# Patient Record
Sex: Female | Born: 1975 | Race: White | Hispanic: No | Smoking: Current every day smoker
Health system: Southern US, Community
[De-identification: ages and names within clinical notes are randomized; demographics above are authoritative.]

## PROBLEM LIST (undated history)

## (undated) DIAGNOSIS — F32A Depression, unspecified: Secondary | ICD-10-CM

## (undated) DIAGNOSIS — M792 Neuralgia and neuritis, unspecified: Secondary | ICD-10-CM

## (undated) DIAGNOSIS — I739 Peripheral vascular disease, unspecified: Secondary | ICD-10-CM

## (undated) DIAGNOSIS — M1990 Unspecified osteoarthritis, unspecified site: Secondary | ICD-10-CM

## (undated) DIAGNOSIS — K219 Gastro-esophageal reflux disease without esophagitis: Secondary | ICD-10-CM

## (undated) DIAGNOSIS — F419 Anxiety disorder, unspecified: Secondary | ICD-10-CM

## (undated) DIAGNOSIS — E7800 Pure hypercholesterolemia, unspecified: Secondary | ICD-10-CM

## (undated) DIAGNOSIS — I10 Essential (primary) hypertension: Secondary | ICD-10-CM

## (undated) DIAGNOSIS — J42 Unspecified chronic bronchitis: Secondary | ICD-10-CM

## (undated) DIAGNOSIS — F329 Major depressive disorder, single episode, unspecified: Secondary | ICD-10-CM

## (undated) DIAGNOSIS — M75100 Unspecified rotator cuff tear or rupture of unspecified shoulder, not specified as traumatic: Secondary | ICD-10-CM

## (undated) DIAGNOSIS — Z87442 Personal history of urinary calculi: Secondary | ICD-10-CM

## (undated) DIAGNOSIS — M5430 Sciatica, unspecified side: Secondary | ICD-10-CM

## (undated) DIAGNOSIS — M549 Dorsalgia, unspecified: Secondary | ICD-10-CM

## (undated) DIAGNOSIS — M722 Plantar fascial fibromatosis: Secondary | ICD-10-CM

## (undated) DIAGNOSIS — J189 Pneumonia, unspecified organism: Secondary | ICD-10-CM

## (undated) DIAGNOSIS — G8929 Other chronic pain: Secondary | ICD-10-CM

## (undated) HISTORY — DX: Peripheral vascular disease, unspecified: I73.9

## (undated) HISTORY — PX: BACK SURGERY: SHX140

## (undated) HISTORY — PX: EYE SURGERY: SHX253

## (undated) HISTORY — PX: TUBAL LIGATION: SHX77

---

## 2004-01-11 ENCOUNTER — Ambulatory Visit (HOSPITAL_COMMUNITY): Admission: RE | Admit: 2004-01-11 | Discharge: 2004-01-11 | Payer: Self-pay | Admitting: *Deleted

## 2006-06-20 ENCOUNTER — Emergency Department (HOSPITAL_COMMUNITY): Admission: EM | Admit: 2006-06-20 | Discharge: 2006-06-20 | Payer: Self-pay | Admitting: Emergency Medicine

## 2006-08-07 ENCOUNTER — Emergency Department (HOSPITAL_COMMUNITY): Admission: EM | Admit: 2006-08-07 | Discharge: 2006-08-07 | Payer: Self-pay | Admitting: Emergency Medicine

## 2006-11-22 ENCOUNTER — Emergency Department (HOSPITAL_COMMUNITY): Admission: EM | Admit: 2006-11-22 | Discharge: 2006-11-23 | Payer: Self-pay | Admitting: Emergency Medicine

## 2007-01-16 ENCOUNTER — Ambulatory Visit (HOSPITAL_COMMUNITY): Admission: RE | Admit: 2007-01-16 | Discharge: 2007-01-17 | Payer: Self-pay | Admitting: Neurosurgery

## 2007-02-23 ENCOUNTER — Emergency Department (HOSPITAL_COMMUNITY): Admission: EM | Admit: 2007-02-23 | Discharge: 2007-02-24 | Payer: Self-pay | Admitting: Emergency Medicine

## 2007-05-08 ENCOUNTER — Emergency Department (HOSPITAL_COMMUNITY): Admission: EM | Admit: 2007-05-08 | Discharge: 2007-05-08 | Payer: Self-pay | Admitting: Emergency Medicine

## 2007-05-10 ENCOUNTER — Ambulatory Visit (HOSPITAL_COMMUNITY): Admission: RE | Admit: 2007-05-10 | Discharge: 2007-05-10 | Payer: Self-pay | Admitting: Neurosurgery

## 2007-05-15 ENCOUNTER — Ambulatory Visit (HOSPITAL_COMMUNITY): Admission: RE | Admit: 2007-05-15 | Discharge: 2007-05-15 | Payer: Self-pay | Admitting: Family Medicine

## 2007-07-13 ENCOUNTER — Emergency Department (HOSPITAL_COMMUNITY): Admission: EM | Admit: 2007-07-13 | Discharge: 2007-07-13 | Payer: Self-pay | Admitting: Emergency Medicine

## 2007-08-01 ENCOUNTER — Encounter: Admission: RE | Admit: 2007-08-01 | Discharge: 2007-08-01 | Payer: Self-pay | Admitting: Family Medicine

## 2007-08-08 ENCOUNTER — Ambulatory Visit: Payer: Self-pay | Admitting: *Deleted

## 2007-08-14 ENCOUNTER — Emergency Department (HOSPITAL_COMMUNITY): Admission: EM | Admit: 2007-08-14 | Discharge: 2007-08-14 | Payer: Self-pay | Admitting: Emergency Medicine

## 2007-08-21 ENCOUNTER — Ambulatory Visit: Payer: Self-pay | Admitting: *Deleted

## 2007-08-21 ENCOUNTER — Inpatient Hospital Stay (HOSPITAL_COMMUNITY): Admission: RE | Admit: 2007-08-21 | Discharge: 2007-08-24 | Payer: Self-pay | Admitting: Neurosurgery

## 2007-09-17 ENCOUNTER — Emergency Department (HOSPITAL_COMMUNITY): Admission: EM | Admit: 2007-09-17 | Discharge: 2007-09-17 | Payer: Self-pay | Admitting: Emergency Medicine

## 2007-09-19 ENCOUNTER — Ambulatory Visit: Payer: Self-pay | Admitting: *Deleted

## 2007-09-27 ENCOUNTER — Encounter: Admission: RE | Admit: 2007-09-27 | Discharge: 2007-09-27 | Payer: Self-pay | Admitting: Neurosurgery

## 2007-09-30 ENCOUNTER — Ambulatory Visit: Payer: Self-pay | Admitting: Surgery

## 2007-10-01 ENCOUNTER — Ambulatory Visit: Payer: Self-pay | Admitting: Surgery

## 2007-10-01 ENCOUNTER — Inpatient Hospital Stay (HOSPITAL_COMMUNITY): Admission: RE | Admit: 2007-10-01 | Discharge: 2007-10-14 | Payer: Self-pay | Admitting: Surgery

## 2007-10-28 ENCOUNTER — Ambulatory Visit: Payer: Self-pay | Admitting: Surgery

## 2007-11-11 ENCOUNTER — Ambulatory Visit: Payer: Self-pay | Admitting: Surgery

## 2007-11-28 ENCOUNTER — Ambulatory Visit: Payer: Self-pay | Admitting: *Deleted

## 2007-12-04 ENCOUNTER — Emergency Department (HOSPITAL_COMMUNITY): Admission: EM | Admit: 2007-12-04 | Discharge: 2007-12-04 | Payer: Self-pay | Admitting: Emergency Medicine

## 2007-12-26 ENCOUNTER — Ambulatory Visit: Payer: Self-pay | Admitting: *Deleted

## 2008-01-06 ENCOUNTER — Emergency Department (HOSPITAL_COMMUNITY): Admission: EM | Admit: 2008-01-06 | Discharge: 2008-01-07 | Payer: Self-pay | Admitting: Emergency Medicine

## 2008-01-10 ENCOUNTER — Ambulatory Visit (HOSPITAL_COMMUNITY): Admission: RE | Admit: 2008-01-10 | Discharge: 2008-01-10 | Payer: Self-pay | Admitting: Neurosurgery

## 2008-02-04 ENCOUNTER — Emergency Department (HOSPITAL_COMMUNITY): Admission: EM | Admit: 2008-02-04 | Discharge: 2008-02-04 | Payer: Self-pay | Admitting: Emergency Medicine

## 2008-03-22 ENCOUNTER — Emergency Department (HOSPITAL_COMMUNITY): Admission: EM | Admit: 2008-03-22 | Discharge: 2008-03-22 | Payer: Self-pay | Admitting: Emergency Medicine

## 2008-05-13 ENCOUNTER — Ambulatory Visit (HOSPITAL_COMMUNITY): Admission: RE | Admit: 2008-05-13 | Discharge: 2008-05-14 | Payer: Self-pay | Admitting: Neurosurgery

## 2008-07-08 ENCOUNTER — Emergency Department (HOSPITAL_COMMUNITY): Admission: EM | Admit: 2008-07-08 | Discharge: 2008-07-08 | Payer: Self-pay | Admitting: Emergency Medicine

## 2008-07-17 ENCOUNTER — Emergency Department (HOSPITAL_COMMUNITY): Admission: EM | Admit: 2008-07-17 | Discharge: 2008-07-17 | Payer: Self-pay | Admitting: Emergency Medicine

## 2008-10-11 ENCOUNTER — Emergency Department (HOSPITAL_COMMUNITY): Admission: EM | Admit: 2008-10-11 | Discharge: 2008-10-11 | Payer: Self-pay | Admitting: Emergency Medicine

## 2008-11-15 ENCOUNTER — Emergency Department (HOSPITAL_COMMUNITY): Admission: EM | Admit: 2008-11-15 | Discharge: 2008-11-15 | Payer: Self-pay | Admitting: Emergency Medicine

## 2008-11-20 ENCOUNTER — Emergency Department (HOSPITAL_COMMUNITY): Admission: EM | Admit: 2008-11-20 | Discharge: 2008-11-20 | Payer: Self-pay | Admitting: Pediatrics

## 2009-01-26 ENCOUNTER — Emergency Department (HOSPITAL_COMMUNITY): Admission: EM | Admit: 2009-01-26 | Discharge: 2009-01-26 | Payer: Self-pay | Admitting: Emergency Medicine

## 2009-02-16 ENCOUNTER — Emergency Department (HOSPITAL_COMMUNITY): Admission: EM | Admit: 2009-02-16 | Discharge: 2009-02-16 | Payer: Self-pay | Admitting: Emergency Medicine

## 2009-02-23 ENCOUNTER — Emergency Department (HOSPITAL_COMMUNITY): Admission: EM | Admit: 2009-02-23 | Discharge: 2009-02-24 | Payer: Self-pay | Admitting: Emergency Medicine

## 2009-03-27 ENCOUNTER — Emergency Department (HOSPITAL_COMMUNITY): Admission: EM | Admit: 2009-03-27 | Discharge: 2009-03-27 | Payer: Self-pay | Admitting: Emergency Medicine

## 2009-04-21 ENCOUNTER — Emergency Department (HOSPITAL_COMMUNITY): Admission: EM | Admit: 2009-04-21 | Discharge: 2009-04-22 | Payer: Self-pay | Admitting: Emergency Medicine

## 2009-05-25 ENCOUNTER — Emergency Department (HOSPITAL_COMMUNITY): Admission: EM | Admit: 2009-05-25 | Discharge: 2009-05-25 | Payer: Self-pay | Admitting: Emergency Medicine

## 2009-06-21 ENCOUNTER — Emergency Department (HOSPITAL_COMMUNITY): Admission: EM | Admit: 2009-06-21 | Discharge: 2009-06-21 | Payer: Self-pay | Admitting: Pediatrics

## 2009-10-04 ENCOUNTER — Emergency Department (HOSPITAL_COMMUNITY): Admission: EM | Admit: 2009-10-04 | Discharge: 2009-10-05 | Payer: Self-pay | Admitting: Emergency Medicine

## 2009-10-10 ENCOUNTER — Emergency Department (HOSPITAL_COMMUNITY): Admission: EM | Admit: 2009-10-10 | Discharge: 2009-10-10 | Payer: Self-pay | Admitting: Emergency Medicine

## 2009-11-14 ENCOUNTER — Emergency Department (HOSPITAL_COMMUNITY): Admission: EM | Admit: 2009-11-14 | Discharge: 2009-11-14 | Payer: Self-pay | Admitting: Emergency Medicine

## 2009-12-15 ENCOUNTER — Emergency Department (HOSPITAL_COMMUNITY): Admission: EM | Admit: 2009-12-15 | Discharge: 2009-12-15 | Payer: Self-pay | Admitting: Emergency Medicine

## 2010-06-26 ENCOUNTER — Emergency Department (HOSPITAL_COMMUNITY)
Admission: EM | Admit: 2010-06-26 | Discharge: 2010-06-26 | Payer: Self-pay | Source: Home / Self Care | Admitting: Emergency Medicine

## 2010-08-14 ENCOUNTER — Encounter: Payer: Self-pay | Admitting: *Deleted

## 2010-10-03 LAB — POCT PREGNANCY, URINE: Preg Test, Ur: NEGATIVE

## 2010-10-10 LAB — URINALYSIS, ROUTINE W REFLEX MICROSCOPIC
Bilirubin Urine: NEGATIVE
Glucose, UA: 250 mg/dL — AB
Hgb urine dipstick: NEGATIVE
Ketones, ur: NEGATIVE mg/dL
Nitrite: NEGATIVE
Protein, ur: NEGATIVE mg/dL
Specific Gravity, Urine: 1.025 (ref 1.005–1.030)
Urobilinogen, UA: 0.2 mg/dL (ref 0.0–1.0)
pH: 5.5 (ref 5.0–8.0)

## 2010-10-10 LAB — GLUCOSE, CAPILLARY: Glucose-Capillary: 277 mg/dL — ABNORMAL HIGH (ref 70–99)

## 2010-10-16 LAB — DIFFERENTIAL
Basophils Absolute: 0 10*3/uL (ref 0.0–0.1)
Basophils Relative: 1 % (ref 0–1)
Eosinophils Absolute: 0.1 10*3/uL (ref 0.0–0.7)
Eosinophils Relative: 2 % (ref 0–5)
Lymphocytes Relative: 34 % (ref 12–46)
Lymphs Abs: 2.7 10*3/uL (ref 0.7–4.0)
Monocytes Absolute: 0.5 10*3/uL (ref 0.1–1.0)
Monocytes Relative: 6 % (ref 3–12)
Neutro Abs: 4.6 10*3/uL (ref 1.7–7.7)
Neutrophils Relative %: 58 % (ref 43–77)

## 2010-10-16 LAB — URINALYSIS, ROUTINE W REFLEX MICROSCOPIC
Bilirubin Urine: NEGATIVE
Glucose, UA: NEGATIVE mg/dL
Hgb urine dipstick: NEGATIVE
Ketones, ur: NEGATIVE mg/dL
Nitrite: NEGATIVE
Protein, ur: NEGATIVE mg/dL
Specific Gravity, Urine: 1.01 (ref 1.005–1.030)
Urobilinogen, UA: 0.2 mg/dL (ref 0.0–1.0)
pH: 6.5 (ref 5.0–8.0)

## 2010-10-16 LAB — COMPREHENSIVE METABOLIC PANEL
ALT: 11 U/L (ref 0–35)
AST: 13 U/L (ref 0–37)
Albumin: 3.5 g/dL (ref 3.5–5.2)
Alkaline Phosphatase: 34 U/L — ABNORMAL LOW (ref 39–117)
BUN: 6 mg/dL (ref 6–23)
CO2: 24 mEq/L (ref 19–32)
Calcium: 9 mg/dL (ref 8.4–10.5)
Chloride: 107 mEq/L (ref 96–112)
Creatinine, Ser: 0.5 mg/dL (ref 0.4–1.2)
GFR calc Af Amer: >60 mL/min (ref >60)
GFR calc non Af Amer: >60 mL/min (ref >60)
Glucose, Bld: 186 mg/dL — ABNORMAL HIGH (ref 70–99)
Potassium: 3.5 mEq/L (ref 3.5–5.1)
Sodium: 139 mEq/L (ref 135–145)
Total Bilirubin: 0.2 mg/dL — ABNORMAL LOW (ref 0.3–1.2)
Total Protein: 5.9 g/dL — ABNORMAL LOW (ref 6.0–8.3)

## 2010-10-16 LAB — CBC
HCT: 36.7 % (ref 36.0–46.0)
Hemoglobin: 12.6 g/dL (ref 12.0–15.0)
MCHC: 34.3 g/dL (ref 30.0–36.0)
MCV: 83.2 fL (ref 78.0–100.0)
Platelets: 278 10*3/uL (ref 150–400)
RBC: 4.41 MIL/uL (ref 3.87–5.11)
RDW: 14.1 % (ref 11.5–15.5)
WBC: 8 10*3/uL (ref 4.0–10.5)

## 2010-10-16 LAB — PREGNANCY, URINE: Preg Test, Ur: NEGATIVE

## 2010-10-16 LAB — LIPASE, BLOOD: Lipase: 17 U/L (ref 11–59)

## 2010-10-28 LAB — D-DIMER, QUANTITATIVE (NOT AT ARMC): D-Dimer, Quant: 0.24 ug/mL-FEU (ref 0.00–0.48)

## 2010-10-28 LAB — BASIC METABOLIC PANEL
BUN: 9 mg/dL (ref 6–23)
CO2: 23 mEq/L (ref 19–32)
Calcium: 9.2 mg/dL (ref 8.4–10.5)
Chloride: 105 mEq/L (ref 96–112)
Creatinine, Ser: 0.54 mg/dL (ref 0.4–1.2)
GFR calc Af Amer: >60 mL/min (ref >60)
GFR calc non Af Amer: >60 mL/min (ref >60)
Glucose, Bld: 323 mg/dL — ABNORMAL HIGH (ref 70–99)
Potassium: 3.3 mEq/L — ABNORMAL LOW (ref 3.5–5.1)
Sodium: 133 mEq/L — ABNORMAL LOW (ref 135–145)

## 2010-10-28 LAB — POCT CARDIAC MARKERS
CKMB, poc: <1 ng/mL — ABNORMAL LOW (ref 1.0–8.0)
CKMB, poc: <1 ng/mL — ABNORMAL LOW (ref 1.0–8.0)
Myoglobin, poc: 33.2 ng/mL (ref 12–200)
Myoglobin, poc: 44.2 ng/mL (ref 12–200)
Troponin i, poc: <0.05 ng/mL (ref 0.00–0.09)
Troponin i, poc: <0.05 ng/mL (ref 0.00–0.09)

## 2010-10-28 LAB — DIFFERENTIAL
Basophils Absolute: 0.1 10*3/uL (ref 0.0–0.1)
Basophils Relative: 1 % (ref 0–1)
Eosinophils Absolute: 0.2 10*3/uL (ref 0.0–0.7)
Eosinophils Relative: 2 % (ref 0–5)
Lymphocytes Relative: 34 % (ref 12–46)
Lymphs Abs: 2.5 10*3/uL (ref 0.7–4.0)
Monocytes Absolute: 0.4 10*3/uL (ref 0.1–1.0)
Monocytes Relative: 6 % (ref 3–12)
Neutro Abs: 4.2 10*3/uL (ref 1.7–7.7)
Neutrophils Relative %: 57 % (ref 43–77)

## 2010-10-28 LAB — CBC
HCT: 36.5 % (ref 36.0–46.0)
Hemoglobin: 12.8 g/dL (ref 12.0–15.0)
MCHC: 35 g/dL (ref 30.0–36.0)
MCV: 87.8 fL (ref 78.0–100.0)
Platelets: 270 10*3/uL (ref 150–400)
RBC: 4.16 MIL/uL (ref 3.87–5.11)
RDW: 12.9 % (ref 11.5–15.5)
WBC: 7.3 10*3/uL (ref 4.0–10.5)

## 2010-10-29 LAB — CBC
HCT: 36.9 % (ref 36.0–46.0)
Hemoglobin: 13.1 g/dL (ref 12.0–15.0)
MCHC: 35.4 g/dL (ref 30.0–36.0)
MCV: 88 fL (ref 78.0–100.0)
Platelets: 259 10*3/uL (ref 150–400)
RBC: 4.19 MIL/uL (ref 3.87–5.11)
RDW: 12.8 % (ref 11.5–15.5)
WBC: 8.4 10*3/uL (ref 4.0–10.5)

## 2010-10-29 LAB — DIFFERENTIAL
Basophils Absolute: 0.1 10*3/uL (ref 0.0–0.1)
Basophils Relative: 1 % (ref 0–1)
Eosinophils Absolute: 0.2 10*3/uL (ref 0.0–0.7)
Eosinophils Relative: 3 % (ref 0–5)
Lymphocytes Relative: 39 % (ref 12–46)
Lymphs Abs: 3.3 10*3/uL (ref 0.7–4.0)
Monocytes Absolute: 0.5 10*3/uL (ref 0.1–1.0)
Monocytes Relative: 6 % (ref 3–12)
Neutro Abs: 4.3 10*3/uL (ref 1.7–7.7)
Neutrophils Relative %: 51 % (ref 43–77)

## 2010-10-29 LAB — URINALYSIS, ROUTINE W REFLEX MICROSCOPIC
Bilirubin Urine: NEGATIVE
Glucose, UA: >1000 mg/dL — AB
Hgb urine dipstick: NEGATIVE
Ketones, ur: NEGATIVE mg/dL
Leukocytes, UA: NEGATIVE
Nitrite: NEGATIVE
Protein, ur: NEGATIVE mg/dL
Specific Gravity, Urine: 1.02 (ref 1.005–1.030)
Urobilinogen, UA: 0.2 mg/dL (ref 0.0–1.0)
pH: 6 (ref 5.0–8.0)

## 2010-10-29 LAB — COMPREHENSIVE METABOLIC PANEL
ALT: 17 U/L (ref 0–35)
AST: 16 U/L (ref 0–37)
Albumin: 3.5 g/dL (ref 3.5–5.2)
Alkaline Phosphatase: 62 U/L (ref 39–117)
BUN: 13 mg/dL (ref 6–23)
CO2: 25 mEq/L (ref 19–32)
Calcium: 9.4 mg/dL (ref 8.4–10.5)
Chloride: 104 mEq/L (ref 96–112)
Creatinine, Ser: 0.6 mg/dL (ref 0.4–1.2)
GFR calc Af Amer: >60 mL/min (ref >60)
GFR calc non Af Amer: >60 mL/min (ref >60)
Glucose, Bld: 258 mg/dL — ABNORMAL HIGH (ref 70–99)
Potassium: 4.2 mEq/L (ref 3.5–5.1)
Sodium: 137 mEq/L (ref 135–145)
Total Bilirubin: 0.4 mg/dL (ref 0.3–1.2)
Total Protein: 6.3 g/dL (ref 6.0–8.3)

## 2010-10-29 LAB — PREGNANCY, URINE: Preg Test, Ur: NEGATIVE

## 2010-10-29 LAB — URINE MICROSCOPIC-ADD ON

## 2010-10-30 LAB — URINALYSIS, ROUTINE W REFLEX MICROSCOPIC
Bilirubin Urine: NEGATIVE
Glucose, UA: 500 mg/dL — AB
Hgb urine dipstick: NEGATIVE
Ketones, ur: NEGATIVE mg/dL
Nitrite: NEGATIVE
Protein, ur: NEGATIVE mg/dL
Specific Gravity, Urine: 1.02 (ref 1.005–1.030)
Urobilinogen, UA: 0.2 mg/dL (ref 0.0–1.0)
pH: 5.5 (ref 5.0–8.0)

## 2010-10-30 LAB — DIFFERENTIAL
Basophils Absolute: 0.1 10*3/uL (ref 0.0–0.1)
Basophils Relative: 1 % (ref 0–1)
Eosinophils Absolute: 0.1 10*3/uL (ref 0.0–0.7)
Eosinophils Relative: 2 % (ref 0–5)
Lymphocytes Relative: 24 % (ref 12–46)
Lymphs Abs: 2 10*3/uL (ref 0.7–4.0)
Monocytes Absolute: 0.5 10*3/uL (ref 0.1–1.0)
Monocytes Relative: 7 % (ref 3–12)
Neutro Abs: 5.4 10*3/uL (ref 1.7–7.7)
Neutrophils Relative %: 67 % (ref 43–77)

## 2010-10-30 LAB — BASIC METABOLIC PANEL
BUN: 10 mg/dL (ref 6–23)
CO2: 25 mEq/L (ref 19–32)
Calcium: 9 mg/dL (ref 8.4–10.5)
Chloride: 104 mEq/L (ref 96–112)
Creatinine, Ser: 0.53 mg/dL (ref 0.4–1.2)
GFR calc Af Amer: >60 mL/min (ref >60)
GFR calc non Af Amer: >60 mL/min (ref >60)
Glucose, Bld: 285 mg/dL — ABNORMAL HIGH (ref 70–99)
Potassium: 4.3 mEq/L (ref 3.5–5.1)
Sodium: 135 mEq/L (ref 135–145)

## 2010-10-30 LAB — CBC
HCT: 39.5 % (ref 36.0–46.0)
Hemoglobin: 13.8 g/dL (ref 12.0–15.0)
MCHC: 34.9 g/dL (ref 30.0–36.0)
MCV: 88.5 fL (ref 78.0–100.0)
Platelets: 258 10*3/uL (ref 150–400)
RBC: 4.46 MIL/uL (ref 3.87–5.11)
RDW: 12.5 % (ref 11.5–15.5)
WBC: 8.1 10*3/uL (ref 4.0–10.5)

## 2010-11-02 LAB — GLUCOSE, CAPILLARY: Glucose-Capillary: 237 mg/dL — ABNORMAL HIGH (ref 70–99)

## 2010-11-20 ENCOUNTER — Emergency Department (HOSPITAL_COMMUNITY)
Admission: EM | Admit: 2010-11-20 | Discharge: 2010-11-20 | Disposition: A | Discharge status: Home / Self Care | Payer: Self-pay | Attending: Emergency Medicine | Admitting: Emergency Medicine

## 2010-11-20 DIAGNOSIS — I10 Essential (primary) hypertension: Secondary | ICD-10-CM | POA: Insufficient documentation

## 2010-11-20 DIAGNOSIS — M545 Low back pain, unspecified: Secondary | ICD-10-CM | POA: Insufficient documentation

## 2010-11-20 DIAGNOSIS — E119 Type 2 diabetes mellitus without complications: Secondary | ICD-10-CM | POA: Insufficient documentation

## 2010-11-20 DIAGNOSIS — G8929 Other chronic pain: Secondary | ICD-10-CM | POA: Insufficient documentation

## 2010-12-06 NOTE — Op Note (Signed)
NAME:  [name], [name] NO.:  [id number]   MEDICAL RECORD NO.:  [id number]          PATIENT TYPE:  INP   LOCATION:  3002                         FACILITY:  [facility]   PHYSICIAN:  Balinda Quails, M.D.    DATE OF BIRTH:  June 08, 1976   DATE OF PROCEDURE:  08/21/2007  DATE OF DISCHARGE:                               OPERATIVE REPORT   SURGEON:  Balinda Quails, M.D.   CO-SURGEONColetta Memos, M.D.   ANESTHESIA:  General endotracheal.   ANESTHESIOLOGIST:  Germaine Pomfret, M.D.   PREOPERATIVE DIAGNOSIS:  L5-S1 degenerative disc disease.   POSTOPERATIVE DIAGNOSIS:  L5-S1 degenerative disc disease.   PROCEDURE:  L5-S1 anterior lumbar interbody fusion (ALIF).   CLINICAL NOTE:  Tina Pham is a 35 year old female with chronic  back pain and history of previous posterior back surgery.  She was  referred to the office. She was seen preoperatively and assessed for L5-  S1 ALIF.  Although she does have morbid obesity, she was felt to be an  acceptable candidate.  The potential risks of the operative procedure  were reviewed in detail with the patient and dictated in her  consultation note.  Note was made of her obesity and slightly increased  risk associated with this.   OPERATIVE PROCEDURE:  The patient was brought to the operating room in  stable hemodynamic condition.  She was placed under general endotracheal  anesthesia.  Arterial line and venous catheter were in place.  In the  supine position, the abdomen was prepped and draped in a sterile  fashion.   A transverse skin incision was made at the [facility] site in the left  lower quadrant from the midline to lateral margin of the rectus.  Dissection was carried down through subcutaneous tissues with  electrocautery.  Anterior rectus sheath was cleared from midline to  lateral margin.  The left anterior rectus sheath was incised  transversely.  Inferior and superior flaps of anterior rectus sheath  were  created.  The rectus muscle mobilized medially.  The patient did  have a previous Pfannenstiel incision and the medial portion of the  rectus muscle was quite fixed.   The retroperitoneal space was then entered in the left lower quadrant.  The inferior epigastric vessels were taken with the peritoneal contents  and rotated anteriorly.  The left common iliac artery and external iliac  artery were identified.  The L5-S1 disc space was palpated.  The ureter  mobilized and retracted medially with the abdominal contents.  Blunt  dissection was carried directly down onto the L5-S1 disc.  The presacral  fascia was then incised and the soft tissues bluntly pushed off of the  L5-S1 disc.  The left common iliac vein was freed off the left side of  the L5-S1 bodies.  The L5-S1 disc was then cleared from left-to-right  over to the lateral margin on the right of the L5-S1 bodies.  With the  disc fully exposed, a Thompson Brau retractor was assembled.  This was  assembled and using reverse lip Brau blades, the disc was exposed with  the blades hooked  onto the lateral margins of L5-S1 bilaterally.  Malleable retractors were used to retract superiorly and inferiorly.   Dr. Franky Macho then completed the L5-S1 ALIF.  Once the instrumentation was  completed, retractors were removed.  There was no significant bleeding.  The ureter was placed back in its anatomical position.  No apparent  complications.  Pulse oximetry remained 100% in the left foot.  The  anterior rectus sheath was then closed running 0 Vicryl suture.  The  deep subcutaneous layer was closed with a running 2-0 Vicryl suture.  The superficial subcutaneous layer was closed with a second layer of  running 2-0 Vicryl suture.  The most superficial layer was closed with a  running 3-0 Vicryl suture.  The skin was closed with 4-0 Monocryl.  Dermabond was applied.  Sponge and instrument counts were correct.  The  patient tolerated the procedure well.   There were no apparent  complications.  She was transferred to the recovery room in stable  condition.      Balinda Quails, M.D.  Electronically Signed     PGH/MEDQ  D:  08/21/2007  T:  08/21/2007  Job:  [id number]

## 2010-12-06 NOTE — Op Note (Signed)
NAME:  AYN, DOMANGUE NO.:  [id number]   MEDICAL RECORD NO.:  [id number]          PATIENT TYPE:  OIB   LOCATION:  3533                         FACILITY:  [facility]   PHYSICIAN:  Coletta Memos, M.D.     DATE OF BIRTH:  July 13, 1976   DATE OF PROCEDURE:  05/13/2008  DATE OF DISCHARGE:                               OPERATIVE REPORT   PREOPERATIVE DIAGNOSIS:  Left L5-S1 foraminal narrowing causing left L5  radiculopathy.   POSTOPERATIVE DIAGNOSIS:  Left L5-S1 foraminal narrowing causing left L5  radiculopathy.   PROCEDURE:  Left L5-S1 facetectomy with microdissection.   SURGEON:  Coletta Memos, MD   ASSISTANT:  None.   COMPLICATIONS:  None.   ANESTHESIA:  General endotracheal.   INDICATIONS:  Tina Pham is a 35 year old who has had a  diskectomy done on the right side and then subsequently had an anterior  fusion from which she is now fused, but she persisted with pain in the  left lower extremity.  Post-myelogram CT showed what I thought to be  recurrent disk, but was actually an osteophyte which may have caused  some compression of the left L5 root.  It was certainly not clear that  was the case on a myelogram, but it was certainly a reasonable  assumption.  Given that and given the fact that she was already fused, I  proposed L5-S1 facetectomy to thoroughly decompress the L5 nerve root.  She agreed.   OPERATIVE NOTE:  Tina Pham was brought to the operating room,  intubated, and placed under general anesthetic.  She was rolled prone  onto a [employer] table.  All pressure points were properly padded.  Her back was prepped.  She was draped in a sterile fashion.  I opened  the old incision and took this down to the thoracolumbar fascia.  I then  exposed lamina of L5 and of S1.  I then performed a facetectomy using a  high-speed drill and a Kerrison punch.  This was done with  microdissection.  With Dr. Fredrich Birks assistance, we were able to  clearly  identify the left L5 root.  The osteophyte was seen.  It was not  compressing the root now that the facet had been removed on the left  side.  There was no bony compression that I could appreciate at any  point from the lateral aspect of the spinal canal through the foramen  and pass the foramen.  I therefore irrigated.  I therefore closed the  wound in layered fashion using Vicryl sutures.  Dermabond was used for  sterile dressing.           ______________________________  Coletta Memos, M.D.     KC/MEDQ  D:  05/13/2008  T:  05/14/2008  Job:  [id number]

## 2010-12-06 NOTE — Discharge Summary (Signed)
NAME:  Tina Pham, Tina Pham NO.:  [id number]   MEDICAL RECORD NO.:  [id number]          PATIENT TYPE:  INP   LOCATION:  2002                         FACILITY:  [facility]   PHYSICIAN:  Juleen China IV, MDDATE OF BIRTH:  [birth year]   DATE OF ADMISSION:  10/01/2007  DATE OF DISCHARGE:  10/14/2007                               DISCHARGE SUMMARY   ADMISSION DIAGNOSIS:  Left flank abscess.   DISCHARGE DIAGNOSIS:  Left flank abscess.   SECONDARY DISCHARGE DIAGNOSES:  1. Type 1 diabetes.  2. Hypertension.  3. Tobacco abuse.  4. Left eye surgery.  5. Tubal ligation in 2000.  6. L5-S1 discectomy.   PROCEDURES:  Incision and drainage of the left flank abscess including  debridement of the skin and soft tissue done by Dr. Myra Gianotti on October 01, 2007.   CONSULTS:  1. Wound care on October 02, 2007.  2. Case management on October 04, 2007.  3. Pharmacy on October 08, 2007.   BRIEF HISTORY AND PHYSICAL:  This is a 35 year old female who has  undergone previous spine surgery.  She had a retroperitoneal approach to  her anterior spine via left flank incision.  She had developed  superficial fat necrosis for which she was doing dressing changes.  However, she came to [facility] on September 30, 2007, and had purulent drainage  from her wound.  She would not tolerate dressing changes.  The patient  comes to the operating room on October 01, 2007, to have wound debrided by  Dr. Myra Gianotti.  Risks and benefits were discussed and informed consent was  signed.   HOSPITAL COURSE:  The patient was admitted to [facility] on  October 01, 2007, to have wound debrided by Dr. Myra Gianotti in the OR.  The  patient signed consent and tolerated procedure without any  complications.  The patient was transferred to the PACU area, where she  continued to remain stable and then later floor 2000, where she remained  stable throughout the night.  The patient's vitals were stable and  afebrile.  The patient's white  blood count was 7.6.  The patient was  complaining of pain, but stated that it was slightly improving.  The  plan was for wound VAC to be placed on that Friday.  Also, dressing  changes b.i.d.  On postop day #2, the patient stated there was still  some burning and pain at wound site.  White blood count has not changed.  Vital signs were stable and the patient was afebrile.  Plan was for  wound care to get involved and possibly start a wound VAC on that  Friday.  Also, hydrotherapy had began and dressing changes b.i.d.  Wound  care on October 04, 2007, stated that there was some slough in the wound  and had increased since initial assessment.  Also, that there was  moderate drainage with a strong odor.  At this time, they did not  recommend wound VAC and to continue the pulse lavage to remove nonviable  tissue.  Dr. Myra Gianotti agreed with this plan.  On October 05, 2007, the  patient  was still complaining of some pain, still requiring pulse lavage  at bedside, debridement.  I felt that it was appropriate to continue  this throughout the weekend, then would revaluate for wound VAC on  Monday.  The patient was taking Avelox at this time, IV, this was day 8  on October 08, 2007.  Throughout the course, the patient still stated that  she is having mild pain.  The patient was on PCA pump for pain and that  was discontinued on October 11, 2007, and the patient went to oral pain  meds which she tolerated.  Also, wound VAC was placed on the patient.  The patient's wound was improving.  Also, was arranged to have wound VAC  and a home health nurse for wound care at home.  The patient continued  to remain afebrile and vital signs were stable.  I did not feel like  there was any change significantly over the weekend and the wound VAC  was in place.  On October 14, 2007, the wound VAC and dressings were  changed.  The patient was still experiencing some pain, and there was a  strong odor from the drainage.  The  patient did tolerate, and we will  discharge to home today with wound VAC in place and home health  arrangements.  The patient states that she agrees to this plan. The  patient also will continue Avelox p.o. until she follows up with Dr.  Myra Gianotti.  Currently, the patient is stable, and we will discharge to  home on October 14, 2007.   DISCHARGE MEDICATIONS:  1. Actos 15 mg daily.  2. Megestrol 40 mg daily.  3. Amaryl 8 mg daily.  4. Celexa 40 mg daily.  5. Propranolol LA 160 mg daily.  6. Byetta 10 mcg x2 daily.  7. Percocet as needed every 6 hours for pain.  8. insulin  50 units subcu daily with meals.  9. Amitriptyline 100 mg daily with meals.  10.Aspirin 81 mg daily.  11.Valium 10 mg 2 times a day.   DISCHARGE INSTRUCTIONS:  The patient is indicated to discharge on October 15, 2007.  Discharge instructions were discussed with the patient and  the patient agrees and states she understands these instructions.  The  patient is to follow a diabetic diet.  The patient is to have a home  health nurse care for wound and VAC changes.  The patient is to increase  activity slowly and walk with assistance.  The patient is to call office  if she has any questions or notices any significant change.  The patient  will follow up with Dr. Myra Gianotti in 1 week to see as needed.  Office will  call the contact.  The patient is to continue home medications per  reconciliation sheet.  The patient is also given a prescription for  Avelox 400 mg 1 tablet daily until followup with Dr. Myra Gianotti.   She is discharged to home on October 14, 2007.  The patient is currently  stable.      Cyndy Freeze, PA      V. Charlena Cross, MD  Electronically Signed    ALW/MEDQ  D:  10/14/2007  T:  10/15/2007  Job:  [id number]

## 2010-12-06 NOTE — Assessment & Plan Note (Signed)
OFFICE VISIT   Tina Pham, Tina Pham  DOB:  08/23/1975                                       11/11/2007  QMVHQ#:[id number]   REASON FOR VISIT:  Followup.   HISTORY:  This is a 35 year old female who is status post anterior  exposure for L5-S1 disease.  This is complicated by left lower quadrant  wound infection which ultimately required operative debridement.  She  has been using a wound vac over her wound and comes back in today for  further followup.   EXAM:  Blood pressure 139/87, pulse 82, respirations 18.  The wound is  healing well.  However, there is some induration, as well as purulent  drainage from the lateral aspect of her wound.  This area was opened and  pus was evacuated.   Status post left lower quadrant infection,   PLAN:  I think the patient needs to go back to wet-to-dry dressing  changes twice a day, given that there is a small area that needs to be  packed as it was inadequately drained with the wound vac.  She will  follow with [place] in 2 weeks.  Again, she will change her dressings to  twice a day wet-to-dry.   Jorge Ny, MD  Electronically Signed   VWB/MEDQ  D:  11/11/2007  T:  11/12/2007  Job:  [id number]

## 2010-12-06 NOTE — Discharge Summary (Signed)
NAME:  Tina Pham, HATA NO.:  [id number]   MEDICAL RECORD NO.:  [id number]          PATIENT TYPE:  INP   LOCATION:  3002                         FACILITY:  [facility]   PHYSICIAN:  Hilda Lias, M.D.   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   FINAL DIAGNOSES:  1. 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  2. Obesity.   CLINICAL HISTORY:  The patient was admitted to the hospital by Dr.  Franky Macho because of recurrent back pain with radiation to her leg.  Surgery was advised to anterior approach.   LABORATORY:  Normal.   COURSE IN THE HOSPITAL:  Patient was taken to surgery by Dr. Franky Macho  with the help of Dr. Madilyn Fireman.  Anterior interbody fusion at L5-S1 was  done.  Today she is feeling much better.  She is ambulating.  She had  minimal discomfort.  She is ready to go home.   CONDITION ON DISCHARGE:  Improving.   MEDICATIONS:  Percocet for pain.   DIET:  She was advised about losing weight.   ACTIVITY:  Not to drive for at least a week.   FOLLOW UP:  She will be calling to see Dr. Franky Macho in the next 2-3 weeks  or before as needed.           ______________________________  Hilda Lias, M.D.     EB/MEDQ  D:  08/24/2007  T:  08/24/2007  Job:  [id number]

## 2010-12-06 NOTE — Op Note (Signed)
NAME:  Tina Pham, Tina Pham NO.:  [id number]   MEDICAL RECORD NO.:  [id number]          PATIENT TYPE:  OIB   LOCATION:  3038                         FACILITY:  [facility]   PHYSICIAN:  Coletta Memos, M.D.     DATE OF BIRTH:  1976/03/01   DATE OF PROCEDURE:  01/16/2007  DATE OF DISCHARGE:  01/17/2007                               OPERATIVE REPORT   PREOPERATIVE DIAGNOSES:  1. Displaced disk L5-S1 on the left side.  2. Left S1 radiculopathy.   POSTOPERATIVE DIAGNOSES:  1. Displaced disk L5-S1 on the left side.  2. Left S1 radiculopathy.   PROCEDURE:  Left L5 and S1 semihemilaminectomy and diskectomy with  microdissection.   COMPLICATIONS:  None.   SURGEON:  Coletta Memos, M.D.   ASSISTANT:  Hilda Lias, M.D.   INDICATIONS:  Tina Pham is a 35 year old with significant pain  in the back and left lower extremity secondary to a very large disk  herniation at L5-S1 on the left side.  Secondary to weakness in the  gastrocnemius on the left side, I have recommended operative  decompression.  She agrees and is admitted today for procedure.   OPERATIVE NOTE:  Tina Pham was brought to the operating room  intubated and placed under a general anesthetic without difficulty.  Back was prepped and she was draped in a sterile fashion.  Using  preoperative localizing I infiltrated her lumbar region with 0.5%  lidocaine with 1:200,000 strength epinephrine.  I opened the skin with a  #10 blade.  I took this down to the thoracolumbar fascia sharply.  I  then exposed the lamina of what I believed to be L5 and S1.  I exposed  the lamina and using x-ray I was able confirm our location in the L5-S1  interlaminar space.  I then performed semihemilaminectomy of L5 using a  high-speed drill and Kerrison punches.  I then removed the ligamentum  flavum to expose the thecal sac.  I then brought the microscope into the  operative field to aid in dissection.  With microdissection  and with Dr.  Cassandria Santee assistance, we removed what was a very large disk herniation at  L5-S1.  I again inspected the S1 nerve roots to make sure that there  were no fragments left.  I did not appreciate any and felt that both the  S1-L5 roots were very well decompressed.  I then irrigated the wound.  I  then closed the wound in layered fashion with Vicryl sutures with Dr.  Cassandria Santee assistance.  Dermabond was used for sterile dressing.           ______________________________  Coletta Memos, M.D.    KC/MEDQ  D:  02/04/2007  T:  02/05/2007  Job:  [id number]

## 2010-12-06 NOTE — Op Note (Signed)
NAME:  Tina Pham, STANARD NO.:  [id number]   MEDICAL RECORD NO.:  [id number]          PATIENT TYPE:  INP   LOCATION:  2002                         FACILITY:  [facility]   PHYSICIAN:  Juleen China IV, MDDATE OF BIRTH:  02/21/1976   DATE OF PROCEDURE:  10/01/2007  DATE OF DISCHARGE:                               OPERATIVE REPORT   PREOPERATIVE DIAGNOSIS:  Left flank abscess.   POSTOPERATIVE DIAGNOSIS:  Left flank abscess.   PROCEDURE PERFORMED:  Incision and drainage of the left flank abscess  including debridement of skin and soft tissue.   ANESTHESIA:  General.   SURGEON:  1. Charlena Cross, MD.   ASSISTANT:  Jerold Coombe, P.A.   FINDINGS:  Fascia was not involved.  This was a superficial process.   DRESSING:  Kerlix.   COMPLICATIONS:  None.   INDICATION:  This is a 35 year old female who has undergone previous  spine surgery.  She had a retroperitoneal approach to her anterior spine  via left flank incision.  She had developed superficial fat necrosis for  which she was doing dressing changes.  However, she came to the office  yesterday, had purulent drainage from her wound.  She would not tolerate  dressing changes.  She comes to the operating room for debridement.  Risks and benefits were discussed and informed consent was signed.   PROCEDURE:  The patient was identified in the holding area and taken to  room 8.  She was placed supine on the table.  General endotracheal  anesthesia was administered.  The patient's abdomen was prepped and  draped in standard sterile fashion.  Time-out was called.  Culture swabs  were first placed within the wound and sent for routine cultures.  At  this point in time, systemic antibiotics were given.  The wound was then  easily opened up.  There was a superficial epidermolysis and fat  necrosis.  This tracked down too, but did not involve the abdominal wall  fascia.  This was strictly a superficial process.  All  of the necrotic  tissue was debrided sharply with a  combination of curved Mayo scissors and a #10 blade.  Once that was  satisfied, the only healthy tissue remained.  The wound was copiously  irrigated.  Damp Kerlix was used for dressing.  Montgomery straps were  also placed with 4 x 4s and ABDs.  The patient was successfully awakened  from anesthesia.  There were no complications.           ______________________________  V. Charlena Cross, MD  Electronically Signed     VWB/MEDQ  D:  10/01/2007  T:  10/02/2007  Job:  [id number]

## 2010-12-06 NOTE — Assessment & Plan Note (Signed)
OFFICE VISIT   Tina Pham, Tina Pham  DOB:  06/21/1976                                       09/30/2007  ZOXWR#:[id number]   HISTORY:  This is a 35 year old female who is status post anterior  exposure for L5-S1 degenerative disk disease.  On 08/21/07, she  underwent repair.  She was recently seen in the office and found to have  wound infection.  This has been managed with dressing changes.  I was  called by home health today for worsening drainage and foul smell from  the wound.  She has not been having any fevers or chills.  She comes in  today for further evaluation.   PAST MEDICAL HISTORY:  1. Type 1 diabetes since age 7.  2. Hypertension.  3. Tobacco abuse.   PAST SURGICAL HISTORY:  1. Left eye surgery in 1986 and 1988.  2. Tubal ligation in 2000.  3. L5-S1 diskectomy in June, 2008.  4. ALIF in August 31, 2007.   FAMILY HISTORY:  Mom died of CHF at age 82.  Father died at age 48 of an  MI.   SOCIAL HISTORY:  She is married with three children.  Smokes one pack a  day.  No alcohol.   REVIEW OF SYSTEMS:  Positive for weight gain.  CARDIAC:  Negative for chest pain.  PULMONARY:  Negative for shortness of breath.  NEURO:  Chronic leg pain.  VASCULAR:  Negative.  SKIN:  Negative.   MEDICATIONS:  1. Actos 15 mg per day.  2. Megestrol 40 mg per day.  3. Amaryl 8 mg per day.  4. Celexa 40 mg daily.  5. Propranolol LA 160 per day.  6. Byetta 10 mcg injected b.i.d.  7. Levemir insulin 50 mg nightly.  8. Amitriptyline 100 mg nightly.  9. Aspirin 81 mg per day.  10.Valium 10 mg p.o. b.i.d.   ALLERGIES:  COMPAZINE, CODEINE.   PHYSICAL EXAMINATION:  Vital signs:  She is afebrile.  Generally, she is  in no acute distress.  Cardiovascular:  Regular.  Pulmonary:  Respirations are nonlabored.  Abdomen:  Left lower quadrant incision.  There is minimal erythema around the skin edges; however, upon removal  of the dressing, there is purulent drainage  with underlying fat  necrosis.   ASSESSMENT/PLAN:  Left lower quadrant wound infection.   PLAN:  Due to the patient's pain and discomfort, I am unable to perform  adequate debridement of the wound.  I feel there is likely a deeper  abscess which needs to be drained operatively.  I am planning on  performing an I&D of the abscess in the operating room tomorrow, which  would be Tuesday, March 10th.  I have discussed with the patient that  this could be underlying significant tissue defect requiring a larger  incision.  There could also be the possibility of infection of her back  hardware.   Jorge Ny, MD  Electronically Signed   VWB/MEDQ  D:  09/30/2007  T:  09/30/2007  Job:  [id number]   cc:   Balinda Quails, M.D.

## 2010-12-06 NOTE — Assessment & Plan Note (Signed)
OFFICE VISIT   [name], [name]  DOB:  April 18, 1976                                       11/28/2007  EAVWU#:[id number]   The patient returns today after incision and drainage of a left flank  abdominal wall abscess which was carried out 10/01/2007 by Dr. Myra Gianotti.  She initially had a vac device placed.  This, however, was removed and  now she is having packing placed.   The lateral aspect of the wound did have some purulent drainage on last  visit according to Dr. Myra Gianotti.  This, however, appears very clean at  this time.  She can now pack this once daily with the assistance of her  husband.   I have renewed a prescription for Percocet 5/325 times 30 tablets.  To  return in 1 month.   Balinda Quails, M.D.  Electronically Signed   PGH/MEDQ  D:  11/28/2007  T:  11/29/2007  Job:  [id number]

## 2010-12-06 NOTE — Assessment & Plan Note (Signed)
OFFICE VISIT   Tina Pham, Tina Pham  DOB:  Aug 11, 1975                                       09/19/2007  ZOXWR#:[id number]   Patient underwent an L5-S1 ALIF on 08/21/07 with Dr. Franky Macho.  She does  have type 1 diabetes and morbid obesity.   She has noted some wound breakdown in her left lower quadrant incision.  On evaluation today, there is dehiscence of the skin and deep fat  necrosis.  Cultures were obtained.  Superficial debridement carried out.   Will arrange for dressing changes on a daily basis and return in two  weeks.   Balinda Quails, M.D.  Electronically Signed   PGH/MEDQ  D:  10/24/2007  T:  10/25/2007  Job:  [id number]

## 2010-12-06 NOTE — Op Note (Signed)
NAME:  Tina Pham, BELEN NO.:  [id number]   MEDICAL RECORD NO.:  [id number]          PATIENT TYPE:  INP   LOCATION:  3002                         FACILITY:  [facility]   PHYSICIAN:  Coletta Memos, M.D.     DATE OF BIRTH:  [birth year]   DATE OF PROCEDURE:  08/21/2007  DATE OF DISCHARGE:                               OPERATIVE REPORT   PREOPERATIVE DIAGNOSIS:  Recurrent displaced disc, left L5-S1.   POSTOPERATIVE DIAGNOSIS:  Recurrent displaced disc, left L5-S1.   PROCEDURE:  Anterior lumbar interbody arthrodesis L5-S1 using Synfix  morselized allograft, BMP and VITOSS placed inside the cage which was a  large footprint 15 degrees implant.   SURGEON:  Coletta Memos, M.D.   ASSISTANT:  None.   The approach is to be dictated under separate cover by Dr. Bud Face.   ANESTHESIA:  General endotracheal.   OPERATIVE NOTE:  After anterior exposure was obtained to the lumbar  spine, I entered the case and placed a spinal needle at what I believed  to be the L5-S1 disc space.  X-ray showed that that was the correct  space.  I then used a 15 blade and opened the annulus.  I then used  various cutting instruments and curettes and rongeurs and removed the  disc material.  I was able to remove the disc and obtained good bony  surface at the disc space.  I also was posterior to the posterior  longitudinal ligament on the left side where the disc herniation was.  I  was able to place a right angle hook and felt that there was no pressure  left on that side where the MRI indicated a disc herniation was located.  After that was accomplished, I then turned my attention to the  arthrodesis.  Again, I prepared the endplates of L5 and S1.  I tried  various implants trial spacers and felt that the 15 degrees large  footprint provided the best coverage.  This was placed without  difficulty into the L5-S1 disc space.  X-ray showed that the implant  appeared to be in good position.  It  was hard to determine the posterior  margin but the implant AP view looked good.  I then placed four screws  without difficulty, first by using an awl and self-tapping screws.  At  that point in time, Dr. Madilyn Fireman returned to the case to close the  incision.  The patient tolerated the procedure well.  Blood loss was  minimal during my portion.           ______________________________  Coletta Memos, M.D.     KC/MEDQ  D:  08/21/2007  T:  08/21/2007  Job:  [id number]

## 2010-12-06 NOTE — Assessment & Plan Note (Signed)
OFFICE VISIT   AZIA, TOUTANT  DOB:  09/16/1975                                       10/28/2007  VWUJW#:[id number]   REASON FOR VISIT:  Followup.   HISTORY:  This is a 35 year old female who underwent anterior exposure  for L5-S1 disease.  This was complicated by left lower quadrant wound  infection which required operative debridement.  Patient comes back in  for followup.  She is placing a wound vac on her wound.  She is still  complaining of pain.  She is not having fevers.   PHYSICAL EXAMINATION:  She is 141/91.  Pulse 107.  Respirations 18.  Temperature is 97.7.  The wound is healing nicely.  There is no evidence  of infection.   PLAN:  We will continue with wound vac.  She is going to follow up with  Dr. Madilyn Fireman in one month.   Jorge Ny, MD  Electronically Signed   VWB/MEDQ  D:  10/28/2007  T:  10/29/2007  Job:  [id number]

## 2010-12-06 NOTE — Consult Note (Signed)
VASCULAR SURGERY CONSULTATION   Tina Pham, Tina Pham  DOB:  Dec 01, 1975                                       08/08/2007  ZOXWR#:[id number]   NEW PATIENT CONSULTATION   PRIMARY CARE PHYSICIAN:  Jeoffrey Massed, M.D.   REASON FOR CONSULTATION:  L5-S1 degenerative disk disease.   HISTORY:  Patient is a 35 year old female referred by Dr. Franky Macho today  for preoperative evaluation for possible L5-S1 anterior lumbar interbody  fusion.  She has recurrent degenerative disk disease at L5-S1.  She  underwent surgery by Dr. Franky Macho in June, 2008.  MRI reveals recurrence  of the disk in the foramen.   Discussion between Dr. Franky Macho and patient, to avoid further surgeries,  he has suggested a fusion.  The best option, according to Dr. Franky Macho at  this time would be an anterior lumbar interbody fusion.   PAST MEDICAL HISTORY:  1. Type 1 diabetes since age 40.  2. Hypertension.  3. Tobacco abuse.   PAST SURGICAL HISTORY:  1. Left eye surgery in 1986 and 1988.  2. Tubal ligation in 2000.  3. L5-S1 diskectomy in June, 2008.   FAMILY HISTORY:  Mother died at age 22 of congestive heart failure.  Father died at age 76 of an MI.  She has siblings who are generally  well.   SOCIAL HISTORY:  Patient is married with three children.  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  She smokes one pack of cigarettes per day.  Does not consume  alcohol.   REVIEW OF SYSTEMS:  She does suffer from problems with weight gain.  Weighs 294 pounds at 5 foot 10.  She denies chest pain or shortness of  breath.  No cough or sputum production.  No change in bowel habits.  No  dysuria or frequency.  Chronic pain in her legs.  No nonhealing ulcers  or rashes.  She has occasional headaches.  Denies seizures or syncope.  No joint discomfort.  She suffers from some depression.  No recent  change in eye sight or hearing.   MEDICATIONS:  1. Actos 15  mg daily.  2. Megestrol 40 mg daily.  3. Amaryl 8 mg daily.  4. Celexa 40 mg daily.  5. Propranolol LA 160 mg daily.  6. Byetta 10 mcg injected b.i.d.  7. Levemir insulin 50 units nightly.  8. Amitriptyline 100 mg nightly.  9. Aspirin 81 mg daily.  10.Valium 10 mg b.i.d. p.r.n.   ALLERGIES:  COMPAZINE, CODEINE.   PHYSICAL EXAMINATION:  An obese 35 year old female, alert and oriented.  No acute distress.  VITAL SIGNS:  BP 129/88, pulse 82 per minute and regular, respirations  18 per minute.  O2 sat 99%.  HEENT:  Mouth and throat are clear.  Normocephalic.  Extraocular  movements are intact.  Atraumatic.  NECK:  Supple.  No thyromegaly or adenopathy.  CHEST:  Equal air entry bilaterally.  No rales or rhonchi.  Normal  breath sounds.  Normal percussion.  CARDIOVASCULAR:  No carotid bruits.  Normal heart sounds without  murmurs.  Regular rate and rhythm.  ABDOMEN:  Obese.  Soft, nontender.  No masses or organomegaly.  Normal  bowel sounds.  LOWER EXTREMITIES:  There are 2+ femoral, popliteal, posterior tibial,  and dorsalis pedis pulses.  No ankle edema.  SKIN:  Intact.  Warm, dry.  No rash or ulceration.  NEUROLOGIC:  Patient is alert and oriented.  Cranial nerves are intact.  Strength equal bilaterally.  Reflexes 1+.   IMPRESSION:  1. Recurrent L5-S1 degenerative disk disease.  2. Type 1 diabetes.  3. Hypertension.  4. Depression.  5. Tobacco abuse.   RECOMMENDATIONS:  Patient is a candidate for an L5-S1 ALIF.  I have  discussed the details of this and the potential difficulty of the  surgery due to her underlying obesity.  Potential risks of the operative  procedure have been reviewed along with the details of the procedure  itself.  Potential risks include but are not limited to DVT, pulmonary  embolus, bleeding, infection, transfusion, limb ischemia, ureter injury,  infection, hernia, or other major complication.  Patient understands  this and does wish to proceed with  surgery.   Balinda Quails, M.D.  Electronically Signed  PGH/MEDQ  D:  08/08/2007  T:  08/08/2007  Job:  [id number]   cc:   Coletta Memos, M.D.  Jeoffrey Massed, MD

## 2010-12-06 NOTE — Assessment & Plan Note (Signed)
OFFICE VISIT   Tina Pham, Tina Pham  DOB:  May 26, 1976                                       12/26/2007  UXLKG#:[id number]   The patient returns today for continued followup of her left flank  abdominal wall incision, abscess and continued wound care.  The vac  device is no longer in place.   She has a small area laterally which continues to close gradually.  No  further purulent drainage.   I will plan followup again in another month.  Continue once daily  dressing changes.   Renewed a prescription for Darvocet 30 tablets.   Balinda Quails, M.D.  Electronically Signed   PGH/MEDQ  D:  12/26/2007  T:  12/27/2007  Job:  [id number]

## 2011-01-02 ENCOUNTER — Emergency Department (HOSPITAL_COMMUNITY): Payer: Self-pay

## 2011-01-02 ENCOUNTER — Emergency Department (HOSPITAL_COMMUNITY)
Admission: EM | Admit: 2011-01-02 | Discharge: 2011-01-02 | Disposition: A | Discharge status: Home / Self Care | Payer: Self-pay | Attending: Emergency Medicine | Admitting: Emergency Medicine

## 2011-01-02 DIAGNOSIS — X500XXA Overexertion from strenuous movement or load, initial encounter: Secondary | ICD-10-CM | POA: Insufficient documentation

## 2011-01-02 DIAGNOSIS — E119 Type 2 diabetes mellitus without complications: Secondary | ICD-10-CM | POA: Insufficient documentation

## 2011-01-02 DIAGNOSIS — F411 Generalized anxiety disorder: Secondary | ICD-10-CM | POA: Insufficient documentation

## 2011-01-02 DIAGNOSIS — Y9210 Unspecified residential institution as the place of occurrence of the external cause: Secondary | ICD-10-CM | POA: Insufficient documentation

## 2011-01-02 DIAGNOSIS — I10 Essential (primary) hypertension: Secondary | ICD-10-CM | POA: Insufficient documentation

## 2011-01-02 DIAGNOSIS — IMO0002 Reserved for concepts with insufficient information to code with codable children: Secondary | ICD-10-CM | POA: Insufficient documentation

## 2011-04-13 LAB — BASIC METABOLIC PANEL
BUN: 9
CO2: 24
Calcium: 9.9
Chloride: 105
Creatinine, Ser: 0.65
GFR calc Af Amer: >60
GFR calc non Af Amer: >60
Glucose, Bld: 323 — ABNORMAL HIGH
Potassium: 4.6
Sodium: 138

## 2011-04-13 LAB — CBC
HCT: 39.5
Hemoglobin: 13.6
MCHC: 34.6
MCV: 87.4
Platelets: 344
RBC: 4.52
RDW: 13.6
WBC: 9.2

## 2011-04-13 LAB — ABO/RH: ABO/RH(D): O POS

## 2011-04-13 LAB — TYPE AND SCREEN
ABO/RH(D): O POS
Antibody Screen: NEGATIVE

## 2011-04-17 LAB — CBC
HCT: 31.7 — ABNORMAL LOW
HCT: 32.1 — ABNORMAL LOW
Hemoglobin: 10.6 — ABNORMAL LOW
Hemoglobin: 11 — ABNORMAL LOW
MCHC: 33.6
MCHC: 34.1
MCV: 86.2
MCV: 86.6
Platelets: 384
Platelets: 410 — ABNORMAL HIGH
RBC: 3.68 — ABNORMAL LOW
RBC: 3.71 — ABNORMAL LOW
RDW: 13.1
RDW: 13.1
WBC: 7.4
WBC: 7.6

## 2011-04-17 LAB — ANAEROBIC CULTURE

## 2011-04-17 LAB — RENAL FUNCTION PANEL
Albumin: 2.7 — ABNORMAL LOW
BUN: 5 — ABNORMAL LOW
CO2: 29
Calcium: 8.9
Chloride: 107
Creatinine, Ser: 0.6
GFR calc Af Amer: >60
GFR calc non Af Amer: >60
Glucose, Bld: 124 — ABNORMAL HIGH
Phosphorus: 4
Potassium: 4
Sodium: 140

## 2011-04-17 LAB — CULTURE, ROUTINE-ABSCESS

## 2011-04-17 LAB — POCT I-STAT 4, (NA,K, GLUC, HGB,HCT)
Glucose, Bld: 121 — ABNORMAL HIGH
HCT: 38
Hemoglobin: 12.9
Operator id: 206361
Potassium: 4.4
Sodium: 139

## 2011-04-24 LAB — BASIC METABOLIC PANEL
BUN: 9
CO2: 27
Calcium: 9.8
Chloride: 106
Creatinine, Ser: 0.48
GFR calc Af Amer: >60
GFR calc non Af Amer: >60
Glucose, Bld: 119 — ABNORMAL HIGH
Potassium: 4.6
Sodium: 138

## 2011-04-24 LAB — CBC
HCT: 40.6
Hemoglobin: 13.6
MCHC: 33.5
MCV: 90
Platelets: 325
RBC: 4.51
RDW: 13.6
WBC: 8.8

## 2011-04-24 LAB — GLUCOSE, CAPILLARY
Glucose-Capillary: 152 — ABNORMAL HIGH
Glucose-Capillary: 160 — ABNORMAL HIGH
Glucose-Capillary: 191 — ABNORMAL HIGH
Glucose-Capillary: 199 — ABNORMAL HIGH
Glucose-Capillary: 231 — ABNORMAL HIGH
Glucose-Capillary: 246 — ABNORMAL HIGH

## 2011-05-03 LAB — CREATININE, SERUM
Creatinine, Ser: 0.63
GFR calc Af Amer: >60
GFR calc non Af Amer: >60

## 2011-05-10 LAB — CBC
HCT: 40.5
Hemoglobin: 13.7
MCHC: 33.7
MCV: 86.2
Platelets: 340
RBC: 4.7
RDW: 13.5
WBC: 11.4 — ABNORMAL HIGH

## 2011-05-10 LAB — BASIC METABOLIC PANEL
BUN: 7
CO2: 24
Calcium: 9.6
Chloride: 105
Creatinine, Ser: 0.63
GFR calc Af Amer: >60
GFR calc non Af Amer: >60
Glucose, Bld: 174 — ABNORMAL HIGH
Potassium: 4.2
Sodium: 136

## 2011-05-29 ENCOUNTER — Emergency Department (HOSPITAL_COMMUNITY): Payer: Self-pay

## 2011-05-29 ENCOUNTER — Emergency Department (HOSPITAL_COMMUNITY)
Admission: EM | Admit: 2011-05-29 | Discharge: 2011-05-29 | Disposition: A | Discharge status: Home / Self Care | Payer: Self-pay | Attending: Emergency Medicine | Admitting: Emergency Medicine

## 2011-05-29 DIAGNOSIS — F411 Generalized anxiety disorder: Secondary | ICD-10-CM | POA: Insufficient documentation

## 2011-05-29 DIAGNOSIS — M542 Cervicalgia: Secondary | ICD-10-CM | POA: Insufficient documentation

## 2011-05-29 DIAGNOSIS — F17200 Nicotine dependence, unspecified, uncomplicated: Secondary | ICD-10-CM | POA: Insufficient documentation

## 2011-05-29 DIAGNOSIS — E7800 Pure hypercholesterolemia, unspecified: Secondary | ICD-10-CM | POA: Insufficient documentation

## 2011-05-29 DIAGNOSIS — M545 Low back pain, unspecified: Secondary | ICD-10-CM | POA: Insufficient documentation

## 2011-05-29 DIAGNOSIS — Y92009 Unspecified place in unspecified non-institutional (private) residence as the place of occurrence of the external cause: Secondary | ICD-10-CM | POA: Insufficient documentation

## 2011-05-29 DIAGNOSIS — I10 Essential (primary) hypertension: Secondary | ICD-10-CM | POA: Insufficient documentation

## 2011-05-29 DIAGNOSIS — M25569 Pain in unspecified knee: Secondary | ICD-10-CM | POA: Insufficient documentation

## 2011-05-29 DIAGNOSIS — E119 Type 2 diabetes mellitus without complications: Secondary | ICD-10-CM | POA: Insufficient documentation

## 2011-05-29 DIAGNOSIS — F3289 Other specified depressive episodes: Secondary | ICD-10-CM | POA: Insufficient documentation

## 2011-05-29 DIAGNOSIS — W1809XA Striking against other object with subsequent fall, initial encounter: Secondary | ICD-10-CM | POA: Insufficient documentation

## 2011-05-29 DIAGNOSIS — Z794 Long term (current) use of insulin: Secondary | ICD-10-CM | POA: Insufficient documentation

## 2011-05-29 DIAGNOSIS — R51 Headache: Secondary | ICD-10-CM | POA: Insufficient documentation

## 2011-05-29 DIAGNOSIS — T07XXXA Unspecified multiple injuries, initial encounter: Secondary | ICD-10-CM | POA: Insufficient documentation

## 2011-05-29 DIAGNOSIS — F329 Major depressive disorder, single episode, unspecified: Secondary | ICD-10-CM | POA: Insufficient documentation

## 2011-05-29 HISTORY — DX: Major depressive disorder, single episode, unspecified: F32.9

## 2011-05-29 HISTORY — DX: Anxiety disorder, unspecified: F41.9

## 2011-05-29 HISTORY — DX: Depression, unspecified: F32.A

## 2011-05-29 HISTORY — DX: Essential (primary) hypertension: I10

## 2011-05-29 HISTORY — DX: Pure hypercholesterolemia, unspecified: E78.00

## 2011-05-29 MED ORDER — OXYCODONE-ACETAMINOPHEN 5-325 MG PO TABS: 1.0000 | ORAL_TABLET | ORAL | Status: AC | PRN

## 2011-05-29 MED ORDER — OXYCODONE-ACETAMINOPHEN 5-325 MG PO TABS
1.0000 | ORAL_TABLET | Freq: Once | ORAL | Status: AC
  Administered 2011-05-29: 1 via ORAL
  Filled 2011-05-29: qty 1

## 2011-05-29 NOTE — ED Notes (Signed)
Pt presents with headache, left knee, and low back pain after falling down 4 steps earlier today. Pt denies LOC. Pt ambulatory to ED.

## 2011-05-29 NOTE — ED Notes (Signed)
Pt a/ox4. resp even and unlabored. NAD at this time. D/C instructions and Rx x1 reviewed  with pt. Pt verbalized understanding. Pt ambulated with steady gate to lobby to wait for friend to transport home.

## 2011-05-30 NOTE — ED Provider Notes (Signed)
Medical screening examination/treatment/procedure(s) were performed by non-physician practitioner and as supervising physician I was immediately available for consultation/collaboration.   Shelda Jakes, MD 05/30/11 [phone]

## 2011-05-30 NOTE — ED Provider Notes (Signed)
History     CSN: [id number] Arrival date & time: 05/29/2011  2:33 PM   First MD Initiated Contact with Patient 05/29/11 1453      Chief Complaint  Patient presents with  . Fall  . Headache  . Knee Pain  . Back Pain    (Consider location/radiation/quality/duration/timing/severity/associated sxs/prior treatment) Patient is a 35 y.o. female presenting with fall, headaches, knee pain, and back pain. The history is provided by the patient.  Fall The accident occurred 3 to 5 hours ago. The fall occurred while walking (She was walking down a flight of steps when she slipped,  hitting her left knee on a step,  "jarring" her lower back,  and hitting her posterior head against the railing.). Impact surface: wooden steps. The point of impact was the head and left knee. The pain is present in the head and left knee (Lower back). The pain is at a severity of 7/10. The pain is moderate. She was ambulatory at the scene. Associated symptoms include headaches. Pertinent negatives include no visual change, no fever, no numbness, no abdominal pain, no nausea, no vomiting and no loss of consciousness. The symptoms are aggravated by activity, standing and ambulation. She has tried nothing for the symptoms.  Headache  Pertinent negatives include no fever, no shortness of breath, no nausea and no vomiting.  Knee Pain Associated symptoms include headaches. Pertinent negatives include no abdominal pain, arthralgias, chest pain, congestion, fever, joint swelling, nausea, neck pain, numbness, rash, sore throat, visual change, vomiting or weakness.  Back Pain  Associated symptoms include headaches. Pertinent negatives include no chest pain, no fever, no numbness, no abdominal pain and no weakness.    Past Medical History  Diagnosis Date  . Diabetes mellitus   . Depression   . Anxiety   . Hypertension   . Migraine   . High cholesterol     Past Surgical History  Procedure Date  . Back surgery   . Eye  surgery     History reviewed. No pertinent family history.  History  Substance Use Topics  . Smoking status: Current Everyday Smoker -- 1.0 packs/day  . Smokeless tobacco: Not on file  . Alcohol Use: Yes     occ    OB History    Grav Para Term Preterm Abortions TAB SAB Ect Mult Living                  Review of Systems  Constitutional: Negative for fever.  HENT: Negative for hearing loss, congestion, sore throat and neck pain.   Eyes: Negative.  Negative for visual disturbance.  Respiratory: Negative for chest tightness and shortness of breath.   Cardiovascular: Negative for chest pain.  Gastrointestinal: Negative for nausea, vomiting and abdominal pain.  Genitourinary: Negative.   Musculoskeletal: Positive for back pain. Negative for joint swelling and arthralgias.  Skin: Negative.  Negative for rash and wound.  Neurological: Positive for headaches. Negative for dizziness, loss of consciousness, syncope, weakness, light-headedness and numbness.  Hematological: Negative.   Psychiatric/Behavioral: Negative.     Allergies  Benadryl and Compazine  Home Medications   Current Outpatient Rx  Name Route Sig Dispense Refill  . ATORVASTATIN CALCIUM 20 MG PO TABS Oral Take 20 mg by mouth daily.      Marland Kitchen CITALOPRAM HYDROBROMIDE 40 MG PO TABS Oral Take 40 mg by mouth daily.      Marland Kitchen CLONAZEPAM 2 MG PO TABS Oral Take 2 mg by mouth at bedtime as needed. For  sleep     . HYDROCODONE-ACETAMINOPHEN 10-325 MG PO TABS Oral Take 1 tablet by mouth every 6 (six) hours as needed. For pain     . INSULIN ISOPHANE & REGULAR (70-30) 100 UNIT/ML [place] SUSP Subcutaneous Inject 30-40 Units into the skin 2 (two) times daily. 40 units every morning and 30 units at bedtime     . LISINOPRIL-HYDROCHLOROTHIAZIDE 10-12.5 MG PO TABS Oral Take 1 tablet by mouth daily.      . MEGESTROL ACETATE 40 MG PO TABS Oral Take 40 mg by mouth at bedtime.      Marland Kitchen PIOGLITAZONE HCL 30 MG PO TABS Oral Take 30 mg by mouth daily.       . SERTRALINE HCL 100 MG PO TABS Oral Take 100 mg by mouth daily.      Marland Kitchen DIAZEPAM 10 MG PO TABS Oral Take 10 mg by mouth 2 (two) times daily as needed. For anxiety     . GABAPENTIN 300 MG PO CAPS Oral Take 300 mg by mouth 3 (three) times daily.      Marland Kitchen LEVOTHYROXINE SODIUM 100 MCG PO TABS Oral Take 100 mcg by mouth daily.      . OXYCODONE-ACETAMINOPHEN 5-325 MG PO TABS Oral Take 1 tablet by mouth every 4 (four) hours as needed for pain. 20 tablet 0    BP 130/98  Pulse 84  Temp(Src) 98.1 F (36.7 C) (Oral)  Resp 20  Ht 5\' 9"  (1.753 m)  Wt 260 lb (117.935 kg)  BMI 38.40 kg/m2  SpO2 98%  LMP 05/13/2011  Physical Exam  Nursing note and vitals reviewed. Constitutional: She is oriented to person, place, and time. She appears well-developed and well-nourished.  HENT:  Head: Normocephalic and atraumatic.  Mouth/Throat: Oropharynx is clear and moist.       No scalp hematoma appreciated.   Eyes: Conjunctivae and EOM are normal. Pupils are equal, round, and reactive to light.  Neck: Normal range of motion. Neck supple.  Cardiovascular: Normal rate, regular rhythm, normal heart sounds and intact distal pulses.        Pedal pulses normal.  Pulmonary/Chest: Effort normal. She has no wheezes.  Abdominal: Soft. Bowel sounds are normal. She exhibits no distension and no mass. There is no tenderness.  Musculoskeletal: Normal range of motion. She exhibits no edema.       Left knee: She exhibits normal range of motion, no swelling, no effusion, no deformity, no erythema, no LCL laxity, normal patellar mobility and normal meniscus. tenderness found. Medial joint line and lateral joint line tenderness noted.       Lumbar back: She exhibits tenderness. She exhibits no swelling, no edema and no spasm.  Lymphadenopathy:    She has no cervical adenopathy.  Neurological: She is alert and oriented to person, place, and time. She has normal strength. She displays no atrophy and no tremor. No cranial nerve  deficit or sensory deficit. She displays a negative Romberg sign. Gait normal. GCS eye subscore is 4. GCS verbal subscore is 5. GCS motor subscore is 6.  Reflex Scores:      Patellar reflexes are 2+ on the right side and 2+ on the left side.      Achilles reflexes are 2+ on the right side and 2+ on the left side.      No strength deficit noted in hip and knee flexor and extensor muscle groups.  Ankle flexion and extension intact.  Skin: Skin is warm and dry. No rash noted.  Psychiatric:  She has a normal mood and affect. Her speech is normal and behavior is normal. Thought content normal. Cognition and memory are normal.    ED Course  Procedures (including critical care time)  Labs Reviewed - No data to display Dg Cervical Spine Complete  05/29/2011  *RADIOLOGY REPORT*  Clinical Data: Fall, neck pain, left knee pain  CERVICAL SPINE - COMPLETE 4+ VIEW  Comparison: None.  Findings: Five views of the cervical spine submitted.  No acute fracture or subluxation.  Alignment, disc spaces and vertebral height are preserved.  No prevertebral soft tissue swelling. Cervical airway is patent.  IMPRESSION: No acute fracture or subluxation.  Original Report Authenticated By: Natasha Mead, M.D.   Dg Thoracic Spine W/swimmers  05/29/2011  *RADIOLOGY REPORT*  Clinical Data: Fall, neck pain, back pain  THORACIC SPINE - 2 VIEW + SWIMMERS  Comparison: None.  Findings: Three views of thoracic spine submitted.  No acute fracture or subluxation.  Minimal thoracic levoscoliosis.  Disc spaces and vertebral height are preserved.  IMPRESSION: No acute fracture or subluxation.  Original Report Authenticated By: Natasha Mead, M.D.   Dg Knee Complete 4 Views Left  05/29/2011  *RADIOLOGY REPORT*  Clinical Data: Larey Seat.  Left knee pain.  LEFT KNEE - COMPLETE 4+ VIEW  Comparison: None  Findings: The joint spaces are maintained.  No acute fracture or osteochondral abnormality.  No definite joint effusion.  IMPRESSION: No acute bony  findings.  Original Report Authenticated By: P. Loralie Champagne, M.D.     1. Contusion of multiple sites       MDM  Fall with multiple contusion sites,  Normal xray findings.          Candis Musa, PA 05/30/11 [phone]

## 2011-07-26 ENCOUNTER — Emergency Department (HOSPITAL_COMMUNITY)
Admission: EM | Admit: 2011-07-26 | Discharge: 2011-07-26 | Disposition: A | Discharge status: Home / Self Care | Payer: Self-pay | Attending: Emergency Medicine | Admitting: Emergency Medicine

## 2011-07-26 ENCOUNTER — Encounter (HOSPITAL_COMMUNITY): Payer: Self-pay | Admitting: *Deleted

## 2011-07-26 DIAGNOSIS — J3489 Other specified disorders of nose and nasal sinuses: Secondary | ICD-10-CM | POA: Insufficient documentation

## 2011-07-26 DIAGNOSIS — I10 Essential (primary) hypertension: Secondary | ICD-10-CM | POA: Insufficient documentation

## 2011-07-26 DIAGNOSIS — F341 Dysthymic disorder: Secondary | ICD-10-CM | POA: Insufficient documentation

## 2011-07-26 DIAGNOSIS — R509 Fever, unspecified: Secondary | ICD-10-CM | POA: Insufficient documentation

## 2011-07-26 DIAGNOSIS — E789 Disorder of lipoprotein metabolism, unspecified: Secondary | ICD-10-CM | POA: Insufficient documentation

## 2011-07-26 DIAGNOSIS — R070 Pain in throat: Secondary | ICD-10-CM | POA: Insufficient documentation

## 2011-07-26 DIAGNOSIS — J069 Acute upper respiratory infection, unspecified: Secondary | ICD-10-CM | POA: Insufficient documentation

## 2011-07-26 DIAGNOSIS — R221 Localized swelling, mass and lump, neck: Secondary | ICD-10-CM | POA: Insufficient documentation

## 2011-07-26 DIAGNOSIS — J329 Chronic sinusitis, unspecified: Secondary | ICD-10-CM | POA: Insufficient documentation

## 2011-07-26 DIAGNOSIS — H519 Unspecified disorder of binocular movement: Secondary | ICD-10-CM | POA: Insufficient documentation

## 2011-07-26 DIAGNOSIS — E119 Type 2 diabetes mellitus without complications: Secondary | ICD-10-CM | POA: Insufficient documentation

## 2011-07-26 DIAGNOSIS — R220 Localized swelling, mass and lump, head: Secondary | ICD-10-CM | POA: Insufficient documentation

## 2011-07-26 DIAGNOSIS — Z79899 Other long term (current) drug therapy: Secondary | ICD-10-CM | POA: Insufficient documentation

## 2011-07-26 DIAGNOSIS — Z794 Long term (current) use of insulin: Secondary | ICD-10-CM | POA: Insufficient documentation

## 2011-07-26 DIAGNOSIS — H9209 Otalgia, unspecified ear: Secondary | ICD-10-CM | POA: Insufficient documentation

## 2011-07-26 DIAGNOSIS — R51 Headache: Secondary | ICD-10-CM | POA: Insufficient documentation

## 2011-07-26 HISTORY — DX: Neuralgia and neuritis, unspecified: M79.2

## 2011-07-26 HISTORY — DX: Plantar fascial fibromatosis: M72.2

## 2011-07-26 MED ORDER — HYDROCODONE-ACETAMINOPHEN 5-325 MG PO TABS: 1.0000 | ORAL_TABLET | Freq: Once | ORAL | Status: AC

## 2011-07-26 MED ORDER — AMOXICILLIN 500 MG PO CAPS: 500.0000 mg | ORAL_CAPSULE | Freq: Three times a day (TID) | ORAL | Status: AC

## 2011-07-26 MED ORDER — KETOROLAC TROMETHAMINE 60 MG/2ML IM SOLN
60.0000 mg | Freq: Once | INTRAMUSCULAR | Status: AC
  Administered 2011-07-26: 60 mg via INTRAMUSCULAR
  Filled 2011-07-26: qty 2

## 2011-07-26 MED ORDER — HYDROCODONE-ACETAMINOPHEN 5-325 MG PO TABS
1.0000 | ORAL_TABLET | Freq: Once | ORAL | Status: AC
  Administered 2011-07-26: 1 via ORAL
  Filled 2011-07-26: qty 1

## 2011-07-26 NOTE — ED Notes (Signed)
Pt reports sore throat, headache around eye and temple, denies fever.  Minor cough noted. Denies nausea or vomiting.

## 2011-07-26 NOTE — ED Notes (Signed)
C/o sore throat and pain around right eye and right side of head

## 2011-07-27 NOTE — ED Provider Notes (Signed)
History     CSN: [id number]  Arrival date & time 07/26/11  1950   First MD Initiated Contact with Patient 07/26/11 2130      Chief Complaint  Patient presents with  . Sore Throat  . Headache  . Fever    (Consider location/radiation/quality/duration/timing/severity/associated sxs/prior treatment) Patient is a 36 y.o. female presenting with URI. The history is provided by the patient.  URI The primary symptoms include headaches, ear pain and sore throat. Primary symptoms do not include fever, wheezing, abdominal pain, nausea, arthralgias or rash. Primary symptoms comment: Nasal congestion with purulent nasal discharge,  Right facial pain which radiates into jaw . The current episode started 3 to 5 days ago. This is a new problem. The problem has been gradually worsening.  The headache is not associated with weakness.  Symptoms associated with the illness include plugged ear sensation, facial pain, sinus pressure, congestion and rhinorrhea. The following treatments were addressed: Acetaminophen was ineffective. A decongestant was ineffective (Has taken generic otc nasal spray decongestant). NSAIDs were not tried.    Past Medical History  Diagnosis Date  . Diabetes mellitus   . Depression   . Anxiety   . Hypertension   . Migraine   . High cholesterol   . Plantar fasciitis   . Neuropathic pain of foot     Past Surgical History  Procedure Date  . Back surgery   . Eye surgery     No family history on file.  History  Substance Use Topics  . Smoking status: Current Everyday Smoker -- 1.0 packs/day  . Smokeless tobacco: Not on file  . Alcohol Use: Yes     occ    OB History    Grav Para Term Preterm Abortions TAB SAB Ect Mult Living                  Review of Systems  Constitutional: Negative for fever.  HENT: Positive for ear pain, congestion, sore throat, rhinorrhea and sinus pressure. Negative for neck pain.   Eyes: Negative.   Respiratory: Negative for chest  tightness, shortness of breath and wheezing.   Cardiovascular: Negative for chest pain.  Gastrointestinal: Negative for nausea and abdominal pain.  Genitourinary: Negative.   Musculoskeletal: Negative for joint swelling and arthralgias.  Skin: Negative.  Negative for rash and wound.  Neurological: Positive for headaches. Negative for dizziness, weakness, light-headedness and numbness.  Hematological: Negative.   Psychiatric/Behavioral: Negative.     Allergies  Benadryl and Compazine  Home Medications   Current Outpatient Rx  Name Route Sig Dispense Refill  . ATORVASTATIN CALCIUM 20 MG PO TABS Oral Take 20 mg by mouth daily.      Marland Kitchen CITALOPRAM HYDROBROMIDE 40 MG PO TABS Oral Take 40 mg by mouth daily.      Marland Kitchen DIAZEPAM 10 MG PO TABS Oral Take 10 mg by mouth 2 (two) times daily as needed. For anxiety    . GABAPENTIN 300 MG PO CAPS Oral Take 300 mg by mouth 3 (three) times daily.      Marland Kitchen HYDROCODONE-ACETAMINOPHEN 10-325 MG PO TABS Oral Take 1 tablet by mouth every 6 (six) hours as needed. For pain     . INSULIN ISOPHANE & REGULAR (70-30) 100 UNIT/ML [place] SUSP Subcutaneous Inject 40-50 Units into the skin 2 (two) times daily. 50 units in the morning and 40 units in the evening     . LEVOTHYROXINE SODIUM 100 MCG PO TABS Oral Take 100 mcg by mouth daily.      Marland Kitchen  LISINOPRIL-HYDROCHLOROTHIAZIDE 10-12.5 MG PO TABS Oral Take 1 tablet by mouth daily.      . SERTRALINE HCL 100 MG PO TABS Oral Take 100 mg by mouth daily.      . AMOXICILLIN 500 MG PO CAPS Oral Take 1 capsule (500 mg total) by mouth 3 (three) times daily. 30 capsule 0  . HYDROCODONE-ACETAMINOPHEN 5-325 MG PO TABS Oral Take 1 tablet by mouth once. 20 tablet 0    BP 146/89  Pulse 96  Temp(Src) 99.6 F (37.6 C) (Oral)  Resp 16  Ht 5\' 9"  (1.753 m)  Wt 241 lb (109.317 kg)  BMI 35.59 kg/m2  SpO2 99%  LMP 06/22/2011  Physical Exam  Nursing note and vitals reviewed. Constitutional: She is oriented to person, place, and time. She  appears well-developed and well-nourished.  HENT:  Head: Normocephalic and atraumatic.  Right Ear: External ear normal.  Left Ear: External ear normal.  Nose: Mucosal edema and rhinorrhea present. Right sinus exhibits maxillary sinus tenderness.  Mouth/Throat: Oropharynx is clear and moist.       Edentulous.  Eyes: Conjunctivae are normal. Pupils are equal, round, and reactive to light. Left eye exhibits abnormal extraocular motion.       Patient has chronic left strabismus.  Neck: Normal range of motion.  Cardiovascular: Normal rate, regular rhythm, normal heart sounds and intact distal pulses.   Pulmonary/Chest: Effort normal and breath sounds normal. She has no wheezes.  Abdominal: Soft. Bowel sounds are normal. There is no tenderness.  Musculoskeletal: Normal range of motion.  Neurological: She is alert and oriented to person, place, and time.  Skin: Skin is warm and dry.  Psychiatric: She has a normal mood and affect.    ED Course  Procedures (including critical care time)  Labs Reviewed - No data to display No results found.   1. Sinusitis   2. URI, acute       MDM  Sx most c/w uri with resultant right maxillary sinusitis.  Amoxil,  hydrocodone prescribed as nsaids not appropriate with other meds taken.       Candis Musa, PA 07/27/11 1531

## 2011-07-28 NOTE — ED Provider Notes (Signed)
Medical screening examination/treatment/procedure(s) were performed by non-physician practitioner and as supervising physician I was immediately available for consultation/collaboration.  Lynda Capistran S. Taylour Lietzke, MD 07/28/11 1604 

## 2011-09-10 ENCOUNTER — Emergency Department (HOSPITAL_COMMUNITY): Payer: Self-pay

## 2011-09-10 ENCOUNTER — Emergency Department (HOSPITAL_COMMUNITY)
Admission: EM | Admit: 2011-09-10 | Discharge: 2011-09-10 | Disposition: A | Discharge status: Home / Self Care | Payer: Self-pay | Attending: Emergency Medicine | Admitting: Emergency Medicine

## 2011-09-10 ENCOUNTER — Encounter (HOSPITAL_COMMUNITY): Payer: Self-pay

## 2011-09-10 DIAGNOSIS — I10 Essential (primary) hypertension: Secondary | ICD-10-CM | POA: Insufficient documentation

## 2011-09-10 DIAGNOSIS — R05 Cough: Secondary | ICD-10-CM

## 2011-09-10 DIAGNOSIS — Z794 Long term (current) use of insulin: Secondary | ICD-10-CM | POA: Insufficient documentation

## 2011-09-10 DIAGNOSIS — R000 Tachycardia, unspecified: Secondary | ICD-10-CM | POA: Insufficient documentation

## 2011-09-10 DIAGNOSIS — F341 Dysthymic disorder: Secondary | ICD-10-CM | POA: Insufficient documentation

## 2011-09-10 DIAGNOSIS — R059 Cough, unspecified: Secondary | ICD-10-CM | POA: Insufficient documentation

## 2011-09-10 DIAGNOSIS — Z79899 Other long term (current) drug therapy: Secondary | ICD-10-CM | POA: Insufficient documentation

## 2011-09-10 DIAGNOSIS — E789 Disorder of lipoprotein metabolism, unspecified: Secondary | ICD-10-CM | POA: Insufficient documentation

## 2011-09-10 DIAGNOSIS — R739 Hyperglycemia, unspecified: Secondary | ICD-10-CM

## 2011-09-10 DIAGNOSIS — E119 Type 2 diabetes mellitus without complications: Secondary | ICD-10-CM | POA: Insufficient documentation

## 2011-09-10 DIAGNOSIS — F17200 Nicotine dependence, unspecified, uncomplicated: Secondary | ICD-10-CM | POA: Insufficient documentation

## 2011-09-10 LAB — GLUCOSE, CAPILLARY: Glucose-Capillary: 224 mg/dL — ABNORMAL HIGH (ref 70–99)

## 2011-09-10 MED ORDER — INSULIN ASPART 100 UNIT/ML IV SOLN
10.0000 [IU] | Freq: Once | INTRAVENOUS | Status: AC
  Administered 2011-09-10: 10 [IU] via INTRAVENOUS

## 2011-09-10 MED ORDER — AZITHROMYCIN 250 MG PO TABS
500.0000 mg | ORAL_TABLET | Freq: Once | ORAL | Status: AC
  Administered 2011-09-10: 500 mg via ORAL
  Filled 2011-09-10: qty 2

## 2011-09-10 MED ORDER — SODIUM CHLORIDE 0.9 % IV BOLUS (SEPSIS)
1000.0000 mL | Freq: Once | INTRAVENOUS | Status: AC
  Administered 2011-09-10: 1000 mL via INTRAVENOUS

## 2011-09-10 MED ORDER — AZITHROMYCIN 250 MG PO TABS: ORAL_TABLET | ORAL | Status: DC

## 2011-09-10 MED ORDER — HYDROCODONE-ACETAMINOPHEN 5-325 MG PO TABS: 1.0000 | ORAL_TABLET | ORAL | Status: AC | PRN

## 2011-09-10 MED ORDER — HYDROCODONE-ACETAMINOPHEN 5-325 MG PO TABS
2.0000 | ORAL_TABLET | Freq: Once | ORAL | Status: AC
  Administered 2011-09-10: 2 via ORAL
  Filled 2011-09-10: qty 2

## 2011-09-10 NOTE — ED Notes (Signed)
CBG 224 

## 2011-09-10 NOTE — ED Notes (Signed)
Patient CBG 434 mg/dL

## 2011-09-10 NOTE — ED Notes (Signed)
Pt a/o x3. resp even/nonlabored. C/o cough/congestion that started yesterday and per pt " progressively getting worse" voice hoarse sounding but able to speak complete sentences with no difficulty. Ambulated without difficulty.

## 2011-09-10 NOTE — ED Provider Notes (Signed)
History  Scribed for [employer]. Colon Branch, MD, the patient was seen in room APA17/APA17. This chart was scribed by Candelaria Stagers. The patient's care started at 10:36 AM    CSN: [id number]  Arrival date & time 09/10/11  7829   First MD Initiated Contact with Patient 09/10/11 [phone]      Chief Complaint  Patient presents with  . Cough  . Nasal Congestion     HPI Tina Pham is a 36 y.o. female who presents to the Emergency Department complaining of an unproductive cough that started yesterday and has gotten progressively worse.  Pt states that the cough is "hurtful" and is having trouble sleeping.  She has taken nyquil and dayquil with no relief.  She states that the cough is worse when lying down.  She has an appointment with her PCP on Thursday which is five days from now.     HPI ELEMENTS: Location:  Onset: yesterday Duration:  Timing:constant Quality: deep, hurtful Severity:  Modifying factors: lying down makes the cough worse Context: as above  Associated symptoms: trouble sleeping    Past Medical History  Diagnosis Date  . Diabetes mellitus   . Depression   . Anxiety   . Hypertension   . Migraine   . High cholesterol   . Plantar fasciitis   . Neuropathic pain of foot     Past Surgical History  Procedure Date  . Back surgery   . Eye surgery     No family history on file.  History  Substance Use Topics  . Smoking status: Current Everyday Smoker -- 1.0 packs/day  . Smokeless tobacco: Not on file  . Alcohol Use: Yes     occ    OB History    Grav Para Term Preterm Abortions TAB SAB Ect Mult Living                  Review of Systems  Constitutional:       Trouble sleeping.  Respiratory: Positive for cough.   All other systems reviewed and are negative.    Allergies  Codeine; Benadryl; and Compazine  Home Medications   Current Outpatient Rx  Name Route Sig Dispense Refill  . ACETAMINOPHEN 500 MG PO TABS Oral Take 1,500 mg by mouth every  6 (six) hours as needed. Pain    . ATORVASTATIN CALCIUM 20 MG PO TABS Oral Take 20 mg by mouth daily.      Marland Kitchen CITALOPRAM HYDROBROMIDE 40 MG PO TABS Oral Take 40 mg by mouth daily.    Marland Kitchen DIAZEPAM 10 MG PO TABS Oral Take 10 mg by mouth 2 (two) times daily as needed. Anxiety/Panic Attacks    . GABAPENTIN 600 MG PO TABS Oral Take 600 mg by mouth 2 (two) times daily.    Marland Kitchen HYDROCODONE-ACETAMINOPHEN 10-325 MG PO TABS Oral Take 1 tablet by mouth every 6 (six) hours as needed. For pain     . IBUPROFEN 200 MG PO TABS Oral Take 800 mg by mouth every 6 (six) hours as needed. Pain    . INSULIN ISOPHANE & REGULAR (70-30) 100 UNIT/ML [place] SUSP Subcutaneous Inject 40-50 Units into the skin 2 (two) times daily. 50 units in the morning and 40 units in the evening     . LEVOTHYROXINE SODIUM 100 MCG PO TABS Oral Take 100 mcg by mouth daily.      Marland Kitchen LISINOPRIL-HYDROCHLOROTHIAZIDE 10-12.5 MG PO TABS Oral Take 1 tablet by mouth daily.      Marland Kitchen  SERTRALINE HCL 100 MG PO TABS Oral Take 100 mg by mouth daily.        BP 141/83  Pulse 109  Temp(Src) 98.3 F (36.8 C) (Oral)  Resp 18  Ht 5\' 9"  (1.753 m)  Wt 251 lb (113.853 kg)  BMI 37.07 kg/m2  SpO2 100%  LMP 08/10/2011  Physical Exam  Nursing note and vitals reviewed. Constitutional: She is oriented to person, place, and time. She appears well-developed and well-nourished.  HENT:  Head: Normocephalic and atraumatic.  Eyes: EOM are normal. Right eye exhibits no discharge. Left eye exhibits no discharge.  Neck: Normal range of motion. Neck supple.  Cardiovascular: Normal heart sounds.  Exam reveals no gallop and no friction rub.   No murmur heard.      Tachycardic   Pulmonary/Chest: She has no wheezes. She has no rales.  Abdominal: There is no tenderness. There is no guarding.  Musculoskeletal: Normal range of motion. She exhibits no edema.  Neurological: She is alert and oriented to person, place, and time.  Skin: Skin is warm and dry.    ED Course  Procedures    DIAGNOSTIC STUDIES: Oxygen Saturation is 100% on room air, normal by my interpretation.    COORDINATION OF CARE:  10:41AM Ordered: azithromycin (ZITHROMAX) tablet 500 mg, sodium chloride 0.9 % bolus 1,000 mL ; insulin aspart (novoLOG) injection 10 Units ; HYDROcodone-acetaminophen (NORCO) 5-325 MG per tablet 2 tablet ; Offer Fluids  12:07PM Ordered: POCT CBG monitoring    Labs Reviewed - No data to display Dg Chest 2 View  09/10/2011  *RADIOLOGY REPORT*  Clinical Data: Cough  CHEST - 2 VIEW  Comparison: 05/29/2011  Findings: Lungs clear.  Heart size and pulmonary vascularity normal.  No effusion.  Visualized bones unremarkable.  IMPRESSION: No acute disease  Original Report Authenticated By: Osa Craver, M.D.        MDM  Patient with cough associated with chest discomfort. Xray without pneumonia. Glucose reading high here due to not taking her medicines. Given IVF, insulin, cough medicine and initiated antibiotic therapy.Pt feels improved after observation and/or treatment in ED.Pt stable in ED with no significant deterioration in condition.The patient appears reasonably screened and/or stabilized for discharge and I doubt any other medical condition or other [facility] requiring further screening, evaluation, or treatment in the ED at this time prior to discharge.  I personally performed the services described in this documentation, which was scribed in my presence. The recorded information has been reviewed and considered.  MDM Reviewed: nursing note and vitals Interpretation: x-ray and labs         [employer]. Colon Branch, MD 09/10/11 1222

## 2011-09-11 LAB — GLUCOSE, CAPILLARY: Glucose-Capillary: 434 mg/dL — ABNORMAL HIGH (ref 70–99)

## 2011-11-19 ENCOUNTER — Encounter (HOSPITAL_COMMUNITY): Payer: Self-pay | Admitting: *Deleted

## 2011-11-19 ENCOUNTER — Emergency Department (HOSPITAL_COMMUNITY)
Admission: EM | Admit: 2011-11-19 | Discharge: 2011-11-19 | Disposition: A | Discharge status: Home / Self Care | Payer: Self-pay | Attending: Emergency Medicine | Admitting: Emergency Medicine

## 2011-11-19 DIAGNOSIS — F3289 Other specified depressive episodes: Secondary | ICD-10-CM | POA: Insufficient documentation

## 2011-11-19 DIAGNOSIS — G43909 Migraine, unspecified, not intractable, without status migrainosus: Secondary | ICD-10-CM

## 2011-11-19 DIAGNOSIS — I10 Essential (primary) hypertension: Secondary | ICD-10-CM | POA: Insufficient documentation

## 2011-11-19 DIAGNOSIS — E119 Type 2 diabetes mellitus without complications: Secondary | ICD-10-CM | POA: Insufficient documentation

## 2011-11-19 DIAGNOSIS — F17200 Nicotine dependence, unspecified, uncomplicated: Secondary | ICD-10-CM | POA: Insufficient documentation

## 2011-11-19 DIAGNOSIS — F411 Generalized anxiety disorder: Secondary | ICD-10-CM | POA: Insufficient documentation

## 2011-11-19 DIAGNOSIS — E7800 Pure hypercholesterolemia, unspecified: Secondary | ICD-10-CM | POA: Insufficient documentation

## 2011-11-19 DIAGNOSIS — F329 Major depressive disorder, single episode, unspecified: Secondary | ICD-10-CM | POA: Insufficient documentation

## 2011-11-19 MED ORDER — KETOROLAC TROMETHAMINE 60 MG/2ML IM SOLN
60.0000 mg | Freq: Once | INTRAMUSCULAR | Status: AC
  Administered 2011-11-19: 60 mg via INTRAMUSCULAR
  Filled 2011-11-19: qty 2

## 2011-11-19 MED ORDER — METOCLOPRAMIDE HCL 10 MG PO TABS
10.0000 mg | ORAL_TABLET | Freq: Once | ORAL | Status: AC
  Administered 2011-11-19: 10 mg via ORAL
  Filled 2011-11-19: qty 1

## 2011-11-19 MED ORDER — HYDROMORPHONE HCL PF 1 MG/ML IJ SOLN
1.0000 mg | Freq: Once | INTRAMUSCULAR | Status: AC
  Administered 2011-11-19: 1 mg via INTRAMUSCULAR
  Filled 2011-11-19: qty 1

## 2011-11-19 NOTE — ED Provider Notes (Signed)
History   Scribed for [employer]. Colon Branch, MD, the patient was seen in APA19/APA19. The chart was scribed by Gilman Schmidt. The patients care was started at 6:18 PM.   CSN: [id number]  Arrival date & time 11/19/11  1725   First MD Initiated Contact with Patient 11/19/11 1747      Chief Complaint  Patient presents with  . Migraine    (Consider location/radiation/quality/duration/timing/severity/associated sxs/prior treatment) HPI Tina Pham is a 36 y.o. female who presents to the Emergency Department complaining of throbbing left sided migraine radiating through temple onset three days. Pt took Ibuprofen and Aleve with no relief. Also notes photophobia and nausea. Denies any vomiting. There are no other associated symptoms and no other alleviating or aggravating factors.   Past Medical History  Diagnosis Date  . Diabetes mellitus   . Depression   . Anxiety   . Hypertension   . Migraine   . High cholesterol   . Plantar fasciitis   . Neuropathic pain of foot     Past Surgical History  Procedure Date  . Back surgery   . Eye surgery     No family history on file.  History  Substance Use Topics  . Smoking status: Current Everyday Smoker -- 1.0 packs/day  . Smokeless tobacco: Not on file  . Alcohol Use: Yes     occ    OB History    Grav Para Term Preterm Abortions TAB SAB Ect Mult Living                  Review of Systems  Eyes: Positive for photophobia.  Gastrointestinal: Positive for nausea. Negative for vomiting.  Neurological: Positive for headaches.  All other systems reviewed and are negative.    Allergies  Codeine; Benadryl; and Compazine  Home Medications   Current Outpatient Rx  Name Route Sig Dispense Refill  . ACETAMINOPHEN 500 MG PO TABS Oral Take 1,500 mg by mouth every 6 (six) hours as needed. Pain    . ATORVASTATIN CALCIUM 20 MG PO TABS Oral Take 20 mg by mouth daily.      . AZITHROMYCIN 250 MG PO TABS  1 every day until finished. 4  tablet 0  . CITALOPRAM HYDROBROMIDE 40 MG PO TABS Oral Take 40 mg by mouth daily.    Marland Kitchen DIAZEPAM 10 MG PO TABS Oral Take 10 mg by mouth 2 (two) times daily as needed. Anxiety/Panic Attacks    . GABAPENTIN 600 MG PO TABS Oral Take 600 mg by mouth 2 (two) times daily.    Marland Kitchen HYDROCODONE-ACETAMINOPHEN 10-325 MG PO TABS Oral Take 1 tablet by mouth every 6 (six) hours as needed. For pain     . IBUPROFEN 200 MG PO TABS Oral Take 800 mg by mouth every 6 (six) hours as needed. Pain    . INSULIN ISOPHANE & REGULAR (70-30) 100 UNIT/ML [place] SUSP Subcutaneous Inject 40-50 Units into the skin 2 (two) times daily. 50 units in the morning and 40 units in the evening     . LEVOTHYROXINE SODIUM 100 MCG PO TABS Oral Take 100 mcg by mouth daily.      Marland Kitchen LISINOPRIL-HYDROCHLOROTHIAZIDE 10-12.5 MG PO TABS Oral Take 1 tablet by mouth daily.      . SERTRALINE HCL 100 MG PO TABS Oral Take 100 mg by mouth daily.        BP 153/93  Pulse 102  Temp(Src) 97.9 F (36.6 C) (Oral)  Resp 20  Ht 5\' 5"  (1.651  m)  Wt 270 lb (122.471 kg)  BMI 44.93 kg/m2  SpO2 100%  LMP 11/18/2011  Physical Exam  Nursing note and vitals reviewed. Constitutional: She appears well-developed and well-nourished.  HENT:  Head: Normocephalic and atraumatic.       edentialist   Eyes: Conjunctivae are normal. Pupils are equal, round, and reactive to light.  Neck: Neck supple. No tracheal deviation present. No thyromegaly present.  Cardiovascular: Normal rate and regular rhythm.   No murmur heard. Pulmonary/Chest: Effort normal and breath sounds normal.  Abdominal: Soft. Bowel sounds are normal. She exhibits no distension. There is no tenderness.  Musculoskeletal: Normal range of motion. She exhibits no edema and no tenderness.  Neurological: She is alert. Coordination normal.       Neuro intact   Skin: Skin is warm and dry. No rash noted.  Psychiatric: She has a normal mood and affect.    ED Course  Procedures (including critical care  time)   DIAGNOSTIC STUDIES: Oxygen Saturation is 100% on room air, normal by my interpretation.    COORDINATION OF CARE: 6:18pm:  - Patient evaluated by ED physician, Toradol, Reglan, Dilaudid ordered  6:57 PM Headache is improved. She is ready for discharge.   MDM  Patient history migraine headaches usually on the left side who is here with a similar headache that began on Thursday. She's been using Aleve and ibuprofen with no relief. She was given an analgesic, anti-inflammatory, and Reglan with relief of the headache. She is taking by mouth fluids. Pt feels improved after observation and/or treatment in ED.Pt stable in ED with no significant deterioration in condition.The patient appears reasonably screened and/or stabilized for discharge and I doubt any other medical condition or other [facility] requiring further screening, evaluation, or treatment in the ED at this time prior to discharge.  I personally performed the services described in this documentation, which was scribed in my presence. The recorded information has been reviewed and considered.   MDM Reviewed: nursing note and vitals           Nicoletta Dress. Colon Branch, MD 11/19/11 1859

## 2011-11-19 NOTE — ED Notes (Signed)
Pt states migraine x 3 days. Nausea. Denies vomiting. Similar to past migraines.

## 2011-11-19 NOTE — Discharge Instructions (Signed)
Drink lots of fluids. Use Tylenol or ibuprofen for your headaches. Followup with her doctor.   Recurrent Migraine Headache You have a recurrent migraine headache. The caregiver can usually provide good relief for this headache. If this headache is the same as your previous migraine headaches, it is safe to treat you without repeating a complete evaluation.  These headaches usually have at least two of the following problems:   They occur on one side of the head, pulsate, and are severe enough to prevent daily activities.   They are aggravated by daily physical activities.  You may have one or more of the following symptoms:   Nausea (feeling sick to your stomach).   Vomiting.   Pain with exposure to bright lights or loud noises.  Most headache sufferers have a family history of migraines. Your headaches may also be related to alcohol and smoking habits. Too much sleep, too little sleep, mood, and anxiety may also play a part. Changing some of these triggers may help you lower the number and level of pain of the headaches. Headaches may be related to menses (female menstruation). There are numerous medications that can prevent these headaches. Your caregiver can help you with a medication or regimen (procedure to follow). If this has been a chronic (long-term) condition, the use of long-term narcotics is not recommended. Using long-term narcotics can cause recurrent migraines. Narcotics are only a temporary measure only. They are used for the infrequent migraine that fails to respond to all other measures. SEEK MEDICAL CARE IF:   You do not get relief from the medications given to you.   You have a recurrence of pain.   This headache begins to differ from past migraine (for example if it is more severe).  SEEK IMMEDIATE MEDICAL CARE IF:  You have a fever.   You have a stiff neck.   You have vision loss or have changes in vision.   You have problems with feeling lightheaded, become  faint, or lose your balance.   You have muscular weakness.   You have loss of muscular control.   You develop severe symptoms different from your first symptoms.   You start losing your balance or have trouble walking.   You feel faint or pass out.  MAKE SURE YOU:   Understand these instructions.   Will watch your condition.   Will get help right away if you are not doing well or get worse.  Document Released: 04/04/2001 Document Revised: 06/29/2011 Document Reviewed: 02/27/2008 [facility] Patient Information 2012 [place], [place].

## 2011-11-27 ENCOUNTER — Encounter (HOSPITAL_COMMUNITY): Payer: Self-pay | Admitting: *Deleted

## 2011-11-27 ENCOUNTER — Emergency Department (HOSPITAL_COMMUNITY): Payer: Self-pay

## 2011-11-27 ENCOUNTER — Emergency Department (HOSPITAL_COMMUNITY)
Admission: EM | Admit: 2011-11-27 | Discharge: 2011-11-28 | Disposition: A | Discharge status: Home / Self Care | Payer: Self-pay | Attending: Emergency Medicine | Admitting: Emergency Medicine

## 2011-11-27 DIAGNOSIS — E119 Type 2 diabetes mellitus without complications: Secondary | ICD-10-CM | POA: Insufficient documentation

## 2011-11-27 DIAGNOSIS — M545 Low back pain, unspecified: Secondary | ICD-10-CM | POA: Insufficient documentation

## 2011-11-27 DIAGNOSIS — W010XXA Fall on same level from slipping, tripping and stumbling without subsequent striking against object, initial encounter: Secondary | ICD-10-CM | POA: Insufficient documentation

## 2011-11-27 DIAGNOSIS — M549 Dorsalgia, unspecified: Secondary | ICD-10-CM | POA: Insufficient documentation

## 2011-11-27 DIAGNOSIS — Z981 Arthrodesis status: Secondary | ICD-10-CM | POA: Insufficient documentation

## 2011-11-27 DIAGNOSIS — I10 Essential (primary) hypertension: Secondary | ICD-10-CM | POA: Insufficient documentation

## 2011-11-27 MED ORDER — HYDROMORPHONE HCL PF 1 MG/ML IJ SOLN
1.0000 mg | Freq: Once | INTRAMUSCULAR | Status: AC
  Administered 2011-11-27: 1 mg via INTRAMUSCULAR
  Filled 2011-11-27: qty 1

## 2011-11-27 MED ORDER — IBUPROFEN 800 MG PO TABS
800.0000 mg | ORAL_TABLET | Freq: Once | ORAL | Status: AC
  Administered 2011-11-27: 800 mg via ORAL
  Filled 2011-11-27: qty 1

## 2011-11-27 NOTE — ED Notes (Signed)
Fell on wet grass, now pain low back and down lt leg.  , No loc, no neck pain

## 2011-11-28 MED ORDER — IBUPROFEN 800 MG PO TABS: 800.0000 mg | ORAL_TABLET | Freq: Three times a day (TID) | ORAL | Status: AC

## 2011-11-28 MED ORDER — OXYCODONE-ACETAMINOPHEN 5-325 MG PO TABS: 1.0000 | ORAL_TABLET | Freq: Four times a day (QID) | ORAL | Status: AC | PRN

## 2011-11-28 MED ORDER — CYCLOBENZAPRINE HCL 10 MG PO TABS: 10.0000 mg | ORAL_TABLET | Freq: Two times a day (BID) | ORAL | Status: AC | PRN

## 2011-11-28 NOTE — ED Provider Notes (Signed)
History     CSN: [id number]  Arrival date & time 11/27/11  2136   First MD Initiated Contact with Patient 11/27/11 2302      Chief Complaint  Patient presents with  . Fall    (Consider location/radiation/quality/duration/timing/severity/associated sxs/prior treatment) HPI History provided by patient. walking today and slipped on wet grass landing on buttocks. C/o midline and L sided lower back pain. Has h/o sciatica and chronic back pain s/p back surgery- followed by DR Cabel. She denies any weakness or numbness to her lower extremities. Pain radiates somewhat to her left thigh- she has had this radiating pain in the past. Hurts to walk unable to ambulate. She has not tried any medications for this at home. She denies any extremity injury. No neck or head injury. She denies any other pain or complaints. She is requesting something stronger than Motrin for her back pain. Past Medical History  Diagnosis Date  . Diabetes mellitus   . Depression   . Anxiety   . Hypertension   . Migraine   . High cholesterol   . Plantar fasciitis   . Neuropathic pain of foot     Past Surgical History  Procedure Date  . Back surgery   . Eye surgery   . Tubal ligation     History reviewed. No pertinent family history.  History  Substance Use Topics  . Smoking status: Current Everyday Smoker -- 1.0 packs/day  . Smokeless tobacco: Not on file  . Alcohol Use: No     occ    OB History    Grav Para Term Preterm Abortions TAB SAB Ect Mult Living                  Review of Systems  Constitutional: Negative for fever and chills.  HENT: Negative for neck pain and neck stiffness.   Eyes: Negative for pain.  Respiratory: Negative for shortness of breath.   Cardiovascular: Negative for chest pain.  Gastrointestinal: Negative for abdominal pain.  Genitourinary: Negative for dysuria.  Musculoskeletal: Positive for back pain.  Skin: Negative for rash.  Neurological: Negative for headaches.    All other systems reviewed and are negative.    Allergies  Codeine; Benadryl; and Compazine  Home Medications   Current Outpatient Rx  Name Route Sig Dispense Refill  . ATORVASTATIN CALCIUM 20 MG PO TABS Oral Take 20 mg by mouth daily.      Marland Kitchen CITALOPRAM HYDROBROMIDE 40 MG PO TABS Oral Take 40 mg by mouth daily.    Marland Kitchen DIAZEPAM 10 MG PO TABS Oral Take 10 mg by mouth 2 (two) times daily as needed. Anxiety/Panic Attacks    . GABAPENTIN 600 MG PO TABS Oral Take 600 mg by mouth 2 (two) times daily.    . IBUPROFEN 200 MG PO TABS Oral Take 800 mg by mouth every 6 (six) hours as needed. Pain    . INSULIN ISOPHANE & REGULAR (70-30) 100 UNIT/ML [place] SUSP Subcutaneous Inject 40-50 Units into the skin 2 (two) times daily. 50 units in the morning and 40 units in the evening     . LEVOTHYROXINE SODIUM 100 MCG PO TABS Oral Take 100 mcg by mouth daily.      Marland Kitchen LISINOPRIL-HYDROCHLOROTHIAZIDE 10-12.5 MG PO TABS Oral Take 1 tablet by mouth daily.      . SERTRALINE HCL 100 MG PO TABS Oral Take 100 mg by mouth daily.        BP 123/68  Pulse 87  Temp(Src) 97.7  F (36.5 C) (Oral)  Resp 20  Ht 5\' 10"  (1.778 m)  Wt 270 lb (122.471 kg)  BMI 38.74 kg/m2  SpO2 100%  LMP 11/17/2011  Physical Exam  Constitutional: She is oriented to person, place, and time. She appears well-developed and well-nourished.  HENT:  Head: Normocephalic and atraumatic.  Eyes: Conjunctivae and EOM are normal. Pupils are equal, round, and reactive to light.  Neck: Trachea normal. Neck supple. No thyromegaly present.  Cardiovascular: Normal rate, regular rhythm, S1 normal, S2 normal and normal pulses.     No systolic murmur is present   No diastolic murmur is present  Pulses:      Radial pulses are 2+ on the right side, and 2+ on the left side.  Pulmonary/Chest: Effort normal and breath sounds normal. She has no wheezes. She has no rhonchi. She has no rales. She exhibits no tenderness.  Abdominal: Soft. Normal appearance and  bowel sounds are normal. There is no tenderness. There is no CVA tenderness and negative Murphy's sign.  Musculoskeletal:       Tender over lower lumbar region midline and paralumbar without palpable deformity. No erythema or abrasion or swelling appreciated. No lower extremity deficits with equal strengths, sensorium to light touch and DTRs.  Neurological: She is alert and oriented to person, place, and time. She has normal strength. She displays normal reflexes. No cranial nerve deficit or sensory deficit. GCS eye subscore is 4. GCS verbal subscore is 5. GCS motor subscore is 6.  Skin: Skin is warm and dry. No rash noted. She is not diaphoretic.  Psychiatric: Her speech is normal.       Cooperative and appropriate    ED Course  Procedures (including critical care time)  Labs Reviewed - No data to display Dg Lumbar Spine Complete  11/27/2011  *RADIOLOGY REPORT*  Clinical Data: Slipped and fell.  Low back pain.  LUMBAR SPINE - COMPLETE 4+ VIEW  Comparison: 06/26/2010  Findings: The patient is status post anterior discectomy and fusion at L5-S1, stable.  No evidence for fracture.  No subluxation. Intervertebral disc spaces are preserved.  The facets are well- aligned bilaterally.  IMPRESSION: Stable exam.  No acute findings.  Original Report Authenticated By: ERIC A. MANSELL, M.D.   Ice, Motrin, Dilaudid intramuscular  X-ray obtained and reviewed as above.  MDM   Back pain after ground level fall. No indication for emergent MRI. Pain improved. Plan ice, pain medications and outpatient followup.        Sunnie Nielsen, MD 11/28/11 0010

## 2011-11-28 NOTE — Discharge Instructions (Signed)
Back Pain, Adult Rest, apply ice and take medications as prescribed. Call your primary care physician or your neurosurgeon for any persistent symptoms. Be evaluated sooner for any worsening condition.   Low back pain is very common. About 1 in 5 people have back pain.The cause of low back pain is rarely dangerous. The pain often gets better over time.About half of people with a sudden onset of back pain feel better in just 2 weeks. About 8 in 10 people feel better by 6 weeks.  CAUSES Some common causes of back pain include:  Strain of the muscles or ligaments supporting the spine.   Wear and tear (degeneration) of the spinal discs.   Arthritis.   Direct injury to the back.  DIAGNOSIS Most of the time, the direct cause of low back pain is not known.However, back pain can be treated effectively even when the exact cause of the pain is unknown.Answering your caregiver's questions about your overall health and symptoms is one of the most accurate ways to make sure the cause of your pain is not dangerous. If your caregiver needs more information, he or she may order lab work or imaging tests (X-rays or MRIs).However, even if imaging tests show changes in your back, this usually does not require surgery. HOME CARE INSTRUCTIONS For many people, back pain returns.Since low back pain is rarely dangerous, it is often a condition that people can learn to [facility] their own.   Remain active. It is stressful on the back to sit or stand in one place. Do not sit, drive, or stand in one place for more than 30 minutes at a time. Take short walks on level surfaces as soon as pain allows.Try to increase the length of time you walk each day.   Do not stay in bed.Resting more than 1 or 2 days can delay your recovery.   Do not avoid exercise or work.Your body is made to move.It is not dangerous to be active, even though your back may hurt.Your back will likely heal faster if you return to being active  before your pain is gone.   Pay attention to your body when you bend and lift. Many people have less discomfortwhen lifting if they bend their knees, keep the load close to their bodies,and avoid twisting. Often, the most comfortable positions are those that put less stress on your recovering back.   Find a comfortable position to sleep. Use a firm mattress and lie on your side with your knees slightly bent. If you lie on your back, put a pillow under your knees.   Only take over-the-counter or prescription medicines as directed by your caregiver. Over-the-counter medicines to reduce pain and inflammation are often the most helpful.Your caregiver may prescribe muscle relaxant drugs.These medicines help dull your pain so you can more quickly return to your normal activities and healthy exercise.   Put ice on the injured area.   Put ice in a plastic bag.   Place a towel between your skin and the bag.   Leave the ice on for 15 to 20 minutes, 3 to 4 times a day for the first 2 to 3 days. After that, ice and heat may be alternated to reduce pain and spasms.   Ask your caregiver about trying back exercises and gentle massage. This may be of some benefit.   Avoid feeling anxious or stressed.Stress increases muscle tension and can worsen back pain.It is important to recognize when you are anxious or stressed and learn  ways to manage it.Exercise is a great option.  SEEK MEDICAL CARE IF:  You have pain that is not relieved with rest or medicine.   You have pain that does not improve in 1 week.   You have new symptoms.   You are generally not feeling well.  SEEK IMMEDIATE MEDICAL CARE IF:   You have pain that radiates from your back into your legs.   You develop new bowel or bladder control problems.   You have unusual weakness or numbness in your arms or legs.   You develop nausea or vomiting.   You develop abdominal pain.   You feel faint.

## 2012-01-21 ENCOUNTER — Encounter (HOSPITAL_COMMUNITY): Payer: Self-pay | Admitting: Emergency Medicine

## 2012-01-21 ENCOUNTER — Emergency Department (HOSPITAL_COMMUNITY)
Admission: EM | Admit: 2012-01-21 | Discharge: 2012-01-21 | Disposition: A | Discharge status: Home / Self Care | Payer: Self-pay | Attending: Emergency Medicine | Admitting: Emergency Medicine

## 2012-01-21 DIAGNOSIS — R51 Headache: Secondary | ICD-10-CM | POA: Insufficient documentation

## 2012-01-21 DIAGNOSIS — F329 Major depressive disorder, single episode, unspecified: Secondary | ICD-10-CM | POA: Insufficient documentation

## 2012-01-21 DIAGNOSIS — F17200 Nicotine dependence, unspecified, uncomplicated: Secondary | ICD-10-CM | POA: Insufficient documentation

## 2012-01-21 DIAGNOSIS — Z79899 Other long term (current) drug therapy: Secondary | ICD-10-CM | POA: Insufficient documentation

## 2012-01-21 DIAGNOSIS — G43909 Migraine, unspecified, not intractable, without status migrainosus: Secondary | ICD-10-CM

## 2012-01-21 DIAGNOSIS — F411 Generalized anxiety disorder: Secondary | ICD-10-CM | POA: Insufficient documentation

## 2012-01-21 DIAGNOSIS — E119 Type 2 diabetes mellitus without complications: Secondary | ICD-10-CM | POA: Insufficient documentation

## 2012-01-21 DIAGNOSIS — F3289 Other specified depressive episodes: Secondary | ICD-10-CM | POA: Insufficient documentation

## 2012-01-21 DIAGNOSIS — I10 Essential (primary) hypertension: Secondary | ICD-10-CM | POA: Insufficient documentation

## 2012-01-21 DIAGNOSIS — E7800 Pure hypercholesterolemia, unspecified: Secondary | ICD-10-CM | POA: Insufficient documentation

## 2012-01-21 MED ORDER — SUMATRIPTAN SUCCINATE 6 MG/0.5ML ~~LOC~~ SOLN
6.0000 mg | Freq: Once | SUBCUTANEOUS | Status: AC
  Administered 2012-01-21: 6 mg via SUBCUTANEOUS
  Filled 2012-01-21: qty 0.5

## 2012-01-21 MED ORDER — IBUPROFEN 400 MG PO TABS
600.0000 mg | ORAL_TABLET | Freq: Once | ORAL | Status: AC
Start: 2012-01-21 — End: 2012-01-21
  Administered 2012-01-21: 600 mg via ORAL
  Filled 2012-01-21: qty 2

## 2012-01-21 MED ORDER — HYDROCODONE-ACETAMINOPHEN 5-325 MG PO TABS
2.0000 | ORAL_TABLET | Freq: Once | ORAL | Status: AC
  Administered 2012-01-21: 2 via ORAL
  Filled 2012-01-21: qty 2

## 2012-01-21 MED ORDER — ONDANSETRON 8 MG PO TBDP
8.0000 mg | ORAL_TABLET | Freq: Once | ORAL | Status: AC
  Administered 2012-01-21: 8 mg via ORAL
  Filled 2012-01-21: qty 1

## 2012-01-21 MED ORDER — SUMATRIPTAN SUCCINATE 50 MG PO TABS: ORAL_TABLET | ORAL | Status: DC

## 2012-01-21 MED ORDER — HYDROCODONE-ACETAMINOPHEN 5-500 MG PO TABS: 1.0000 | ORAL_TABLET | Freq: Four times a day (QID) | ORAL | Status: AC | PRN

## 2012-01-21 NOTE — ED Provider Notes (Signed)
History     CSN: [id number]  Arrival date & time 01/21/12  1115   First MD Initiated Contact with Patient 01/21/12 1308      Chief complaint: headache   (Consider location/radiation/quality/duration/timing/severity/associated sxs/prior treatment) The history is provided by the patient.  pt states has long hx migraine headaches, states in past 2 days she is having a persistent migraine headache. Right frontal. Constant, dull, at times throbbing. Same as prior migraines. Current headache was gradual in onset, began at rest/no specific activity, slowly worse, no acute or abrupt worsening today. Nausea. No vomiting. No sinus drainage or pain. No recent fall or head injury. No neck pain or stiffness. No eye pain or change in vision. No fever or chills. No change in speech. No numbness/weakness. No problems w balance or coordination.   Past Medical History  Diagnosis Date  . Diabetes mellitus   . Depression   . Anxiety   . Hypertension   . Migraine   . High cholesterol   . Plantar fasciitis   . Neuropathic pain of foot     Past Surgical History  Procedure Date  . Back surgery   . Eye surgery   . Tubal ligation     No family history on file.  History  Substance Use Topics  . Smoking status: Current Everyday Smoker -- 1.0 packs/day  . Smokeless tobacco: Not on file  . Alcohol Use: No     occ    OB History    Grav Para Term Preterm Abortions TAB SAB Ect Mult Living                  Review of Systems  Constitutional: Negative for fever and chills.  HENT: Negative for congestion, rhinorrhea, neck pain and neck stiffness.   Eyes: Negative for pain, redness and visual disturbance.  Respiratory: Negative for cough and shortness of breath.   Cardiovascular: Negative for chest pain.  Gastrointestinal: Negative for vomiting and abdominal pain.  Genitourinary: Negative for flank pain.  Musculoskeletal: Negative for back pain.  Skin: Negative for rash.  Neurological:  Positive for headaches. Negative for syncope, weakness and numbness.  Hematological: Does not bruise/bleed easily.  Psychiatric/Behavioral: Negative for confusion.    Allergies  Codeine; Benadryl; and Compazine  Home Medications   Current Outpatient Rx  Name Route Sig Dispense Refill  . ACETAMINOPHEN 500 MG PO TABS Oral Take 1,500 mg by mouth every 6 (six) hours as needed. For pain    . IBUPROFEN 200 MG PO TABS Oral Take 800 mg by mouth every 6 (six) hours as needed. Pain      BP 142/88  Pulse 85  Temp 98.3 F (36.8 C) (Oral)  Resp 19  Ht 5\' 10"  (1.778 m)  Wt 210 lb (95.255 kg)  BMI 30.13 kg/m2  SpO2 99%  LMP 12/23/2011  Physical Exam  Nursing note and vitals reviewed. Constitutional: She is oriented to person, place, and time. She appears well-developed and well-nourished. No distress.  HENT:  Head: Atraumatic.  Nose: Nose normal.  Mouth/Throat: Oropharynx is clear and moist.       No sinus or temporal tenderness  Eyes: Conjunctivae and EOM are normal. Pupils are equal, round, and reactive to light. No scleral icterus.  Neck: Normal range of motion. Neck supple. No tracheal deviation present.       No stiffness or rigidity  Cardiovascular: Normal rate, regular rhythm, normal heart sounds and intact distal pulses.   Pulmonary/Chest: Effort normal and breath sounds  normal. No respiratory distress.  Abdominal: Soft. Normal appearance. She exhibits no distension. There is no tenderness.  Musculoskeletal: She exhibits no edema and no tenderness.  Neurological: She is alert and oriented to person, place, and time. No cranial nerve deficit.       Motor intact bil. Steady gait.  Skin: Skin is warm and dry. No rash noted.  Psychiatric: She has a normal mood and affect.    ED Course  Procedures (including critical care time)    MDM  Pt states headache similar to prior migraines. States no specific prior migraine meds has worked with consistency. No meds pta.  Imitrex  [place]. Motrin po, vicodin po, zofran po. Pt states she has a ride, that she does not have to drive home.  Reviewed nursing notes and prior charts for additional history.   Recheck pt appears comfortable.        Suzi Roots, MD 01/21/12 [phone]

## 2012-01-21 NOTE — Discharge Instructions (Signed)
Rest. Drink plenty of fluids.  You may take imitrex as prescribed, as need, for migraine. You may take motrin or aleve as need for pain. You may also take vicodin as need for pain.  No driving for the next 6 hours or when taking vicodin. Also, do not take tylenol or acetaminophen containing medication when taking vicodin. Follow up with primary care doctor in coming week.  Return to ER if worse, severe pain, persistent vomiting, other concern.   You were given pain medication in the ER  - no driving for the next 6 hours.        Migraine Headache A migraine headache is an intense, throbbing pain on one or both sides of your head. The exact cause of a migraine headache is not always known. A migraine may be caused when nerves in the brain become irritated and release chemicals that cause swelling within blood vessels, causing pain. Many migraine sufferers have a family history of migraines. Before you get a migraine you may or may not get an aura. An aura is a group of symptoms that can predict the beginning of a migraine. An aura may include:  Visual changes such as:   Flashing lights.   Bright spots or zig-zag lines.   Tunnel vision.   Feelings of numbness.   Trouble talking.   Muscle weakness.  SYMPTOMS  Pain on one or both sides of your head.   Pain that is pulsating or throbbing in nature.   Pain that is severe enough to prevent daily activities.   Pain that is aggravated by any daily physical activity.   Nausea (feeling sick to your stomach), vomiting, or both.   Pain with exposure to bright lights, loud noises, or activity.   General sensitivity to bright lights or loud noises.  MIGRAINE TRIGGERS Examples of triggers of migraine headaches include:   Alcohol.   Smoking.   Stress.   It may be related to menses (female menstruation).   Aged cheeses.   Foods or drinks that contain nitrates, glutamate, aspartame, or tyramine.   Lack of sleep.   Chocolate.    Caffeine.   Hunger.   Medications such as nitroglycerine (used to treat chest pain), birth control pills, estrogen, and some blood pressure medications.  DIAGNOSIS  A migraine headache is often diagnosed based on:  Symptoms.   Physical examination.   A computerized X-ray scan (computed tomography, CT) of your head.  TREATMENT  Medications can help prevent migraines if they are recurrent or should they become recurrent. Your caregiver can help you with a medication or treatment program that will be helpful to you.   Lying down in a dark, quiet room may be helpful.   Keeping a headache diary may help you find a trend as to what may be triggering your headaches.  SEEK IMMEDIATE MEDICAL CARE IF:   You have confusion, personality changes or seizures.   You have headaches that wake you from sleep.   You have an increased frequency in your headaches.   You have a stiff neck.   You have a loss of vision.   You have muscle weakness.   You start losing your balance or have trouble walking.   You feel faint or pass out.  MAKE SURE YOU:   Understand these instructions.   Will watch your condition.   Will get help right away if you are not doing well or get worse.  Document Released: 07/10/2005 Document Revised: 06/29/2011 Document Reviewed: 02/23/2009  [employer] Patient Information 2012 [place], [place].

## 2012-01-21 NOTE — ED Notes (Signed)
Patient with c/o headache that started yesterday. Nausea, denies vomiting. Reports lightheadedness and blurry vision. Alert/oriented x 4.

## 2012-01-28 ENCOUNTER — Emergency Department (HOSPITAL_COMMUNITY)
Admission: EM | Admit: 2012-01-28 | Discharge: 2012-01-28 | Disposition: A | Discharge status: Home / Self Care | Payer: Self-pay | Attending: Emergency Medicine | Admitting: Emergency Medicine

## 2012-01-28 ENCOUNTER — Encounter (HOSPITAL_COMMUNITY): Payer: Self-pay

## 2012-01-28 DIAGNOSIS — F411 Generalized anxiety disorder: Secondary | ICD-10-CM | POA: Insufficient documentation

## 2012-01-28 DIAGNOSIS — G5790 Unspecified mononeuropathy of unspecified lower limb: Secondary | ICD-10-CM | POA: Insufficient documentation

## 2012-01-28 DIAGNOSIS — E7800 Pure hypercholesterolemia, unspecified: Secondary | ICD-10-CM | POA: Insufficient documentation

## 2012-01-28 DIAGNOSIS — G43909 Migraine, unspecified, not intractable, without status migrainosus: Secondary | ICD-10-CM | POA: Insufficient documentation

## 2012-01-28 DIAGNOSIS — F17200 Nicotine dependence, unspecified, uncomplicated: Secondary | ICD-10-CM | POA: Insufficient documentation

## 2012-01-28 DIAGNOSIS — F3289 Other specified depressive episodes: Secondary | ICD-10-CM | POA: Insufficient documentation

## 2012-01-28 DIAGNOSIS — F329 Major depressive disorder, single episode, unspecified: Secondary | ICD-10-CM | POA: Insufficient documentation

## 2012-01-28 DIAGNOSIS — M722 Plantar fascial fibromatosis: Secondary | ICD-10-CM | POA: Insufficient documentation

## 2012-01-28 DIAGNOSIS — I10 Essential (primary) hypertension: Secondary | ICD-10-CM | POA: Insufficient documentation

## 2012-01-28 DIAGNOSIS — E119 Type 2 diabetes mellitus without complications: Secondary | ICD-10-CM | POA: Insufficient documentation

## 2012-01-28 HISTORY — DX: Other chronic pain: G89.29

## 2012-01-28 HISTORY — DX: Dorsalgia, unspecified: M54.9

## 2012-01-28 MED ORDER — METOCLOPRAMIDE HCL 10 MG PO TABS: 10.0000 mg | ORAL_TABLET | Freq: Four times a day (QID) | ORAL | Status: DC | PRN

## 2012-01-28 MED ORDER — METOCLOPRAMIDE HCL 5 MG/ML IJ SOLN
10.0000 mg | Freq: Once | INTRAMUSCULAR | Status: AC
  Administered 2012-01-28: 10 mg via INTRAMUSCULAR
  Filled 2012-01-28: qty 2

## 2012-01-28 MED ORDER — HYDROMORPHONE HCL PF 1 MG/ML IJ SOLN
1.0000 mg | Freq: Once | INTRAMUSCULAR | Status: AC
  Administered 2012-01-28: 1 mg via INTRAMUSCULAR
  Filled 2012-01-28: qty 1

## 2012-01-28 MED ORDER — KETOROLAC TROMETHAMINE 60 MG/2ML IM SOLN
60.0000 mg | Freq: Once | INTRAMUSCULAR | Status: AC
  Administered 2012-01-28: 60 mg via INTRAMUSCULAR
  Filled 2012-01-28: qty 2

## 2012-01-28 NOTE — ED Provider Notes (Signed)
History     CSN: [id number]  Arrival date & time 01/28/12  1759   First MD Initiated Contact with Patient 01/28/12 1807      Chief Complaint  Patient presents with  . Migraine    HPI Pt was seen at 1810.  Per pt, c/o gradual onset and persistence of constant acute flair of her chronic migraine headache since this morning.  Describes the headache as per her usual chronic migraine headache pain pattern "since I was a teenager."  Has been associated with N/V.  Denies headache was sudden or maximal in onset or at any time.  Denies visual changes, no focal motor weakness, no tingling/numbness in extremities, no fevers, no neck pain, no rash.     Past Medical History  Diagnosis Date  . Diabetes mellitus   . Depression   . Anxiety   . Hypertension   . Migraine   . High cholesterol   . Plantar fasciitis   . Neuropathic pain of foot   . Chronic back pain     Past Surgical History  Procedure Date  . Back surgery   . Eye surgery   . Tubal ligation     History  Substance Use Topics  . Smoking status: Current Everyday Smoker -- 1.0 packs/day  . Smokeless tobacco: Not on file  . Alcohol Use: No     occ    Review of Systems ROS: Statement: All systems negative except as marked or noted in the HPI; Constitutional: Negative for fever and chills. ; ; Eyes: Negative for eye pain, redness and discharge. ; ; ENMT: Negative for ear pain, hoarseness, nasal congestion, sinus pressure and sore throat. ; ; Cardiovascular: Negative for chest pain, palpitations, diaphoresis, dyspnea and peripheral edema. ; ; Respiratory: Negative for cough, wheezing and stridor. ; ; Gastrointestinal: +N/V. Negative for diarrhea, abdominal pain, blood in stool, hematemesis, jaundice and rectal bleeding. . ; ; Genitourinary: Negative for dysuria, flank pain and hematuria. ; ; Musculoskeletal: Negative for back pain and neck pain. Negative for swelling and trauma.; ; Skin: Negative for pruritus, rash, abrasions,  blisters, bruising and skin lesion.; ; Neuro: +headache. Negative for lightheadedness and neck stiffness. Negative for weakness, altered level of consciousness , altered mental status, extremity weakness, paresthesias, involuntary movement, seizure and syncope.     Allergies  Codeine; Benadryl; and Compazine  Home Medications   Current Outpatient Rx  Name Route Sig Dispense Refill  . ACETAMINOPHEN 500 MG PO TABS Oral Take 1,500 mg by mouth every 6 (six) hours as needed. For pain    . HYDROCODONE-ACETAMINOPHEN 5-500 MG PO TABS Oral Take 1-2 tablets by mouth every 6 (six) hours as needed for pain. 15 tablet 0  . IBUPROFEN 200 MG PO TABS Oral Take 800 mg by mouth every 6 (six) hours as needed. Pain    . SUMATRIPTAN SUCCINATE 50 MG PO TABS  Take one (1) tablet prn migraine, may repeat dose in 2 hours as need. Do not exceed 3 doses in a 24 hour period. 10 tablet 0    BP 122/84  Pulse 96  Temp 98.2 F (36.8 C) (Oral)  Resp 20  Ht 5\' 10"  (1.778 m)  Wt 220 lb (99.791 kg)  BMI 31.57 kg/m2  SpO2 99%  LMP 12/23/2011  Physical Exam 1815: Physical examination:  Nursing notes reviewed; Vital signs and O2 SAT reviewed;  Constitutional: Well developed, Well nourished, Well hydrated, In no acute distress; Head:  Normocephalic, atraumatic; Eyes: EOMI, PERRL, No scleral icterus;  ENMT: TM's clear bilat. Mouth and pharynx normal, Mucous membranes moist; Neck: Supple, Full range of motion, No lymphadenopathy; Cardiovascular: Regular rate and rhythm, No murmur, rub, or gallop; Respiratory: Breath sounds clear & equal bilaterally, No rales, rhonchi, wheezes.  Speaking full sentences with ease, Normal respiratory effort/excursion; Chest: Nontender, Movement normal; Abdomen: Soft, Nontender, Nondistended, Normal bowel sounds;; Extremities: Pulses normal, No tenderness, No edema, No calf edema or asymmetry.; Neuro: AA&Ox3, Major CN grossly intact.  Speech clear. No facial droop. No gross focal motor or sensory  deficits in extremities.; Skin: Color normal, Warm, Dry, no rash.   ED Course  Procedures    MDM  MDM Reviewed: previous chart, nursing note and vitals      6:27 PM:   Long hx of chronic migraine headaches with multiple ED visits for same.  Pt endorses acute flair of her usual long standing chronic headache today, no change from her usual chronic pain pattern.  Pt encouraged to f/u with her PMD for good continuity of care and control of her chronic pain.  Verb understanding.         Laray Anger, DO 01/29/12 1752

## 2012-01-28 NOTE — ED Notes (Signed)
Discharge instruction discussed with patient who agrees with plan of care and followup.  Driver at bedside to take patient home.

## 2012-01-28 NOTE — ED Notes (Signed)
Migraine and vomiting

## 2012-02-22 ENCOUNTER — Emergency Department (HOSPITAL_COMMUNITY): Payer: Self-pay

## 2012-02-22 ENCOUNTER — Encounter (HOSPITAL_COMMUNITY): Payer: Self-pay | Admitting: *Deleted

## 2012-02-22 ENCOUNTER — Emergency Department (HOSPITAL_COMMUNITY)
Admission: EM | Admit: 2012-02-22 | Discharge: 2012-02-22 | Disposition: A | Discharge status: Home / Self Care | Payer: Self-pay | Attending: Emergency Medicine | Admitting: Emergency Medicine

## 2012-02-22 DIAGNOSIS — Y92009 Unspecified place in unspecified non-institutional (private) residence as the place of occurrence of the external cause: Secondary | ICD-10-CM | POA: Insufficient documentation

## 2012-02-22 DIAGNOSIS — S3992XA Unspecified injury of lower back, initial encounter: Secondary | ICD-10-CM

## 2012-02-22 DIAGNOSIS — M549 Dorsalgia, unspecified: Secondary | ICD-10-CM | POA: Insufficient documentation

## 2012-02-22 DIAGNOSIS — M542 Cervicalgia: Secondary | ICD-10-CM | POA: Insufficient documentation

## 2012-02-22 DIAGNOSIS — R51 Headache: Secondary | ICD-10-CM | POA: Insufficient documentation

## 2012-02-22 DIAGNOSIS — I10 Essential (primary) hypertension: Secondary | ICD-10-CM | POA: Insufficient documentation

## 2012-02-22 DIAGNOSIS — W010XXA Fall on same level from slipping, tripping and stumbling without subsequent striking against object, initial encounter: Secondary | ICD-10-CM | POA: Insufficient documentation

## 2012-02-22 DIAGNOSIS — IMO0002 Reserved for concepts with insufficient information to code with codable children: Secondary | ICD-10-CM | POA: Insufficient documentation

## 2012-02-22 DIAGNOSIS — W19XXXA Unspecified fall, initial encounter: Secondary | ICD-10-CM

## 2012-02-22 MED ORDER — OXYCODONE-ACETAMINOPHEN 5-325 MG PO TABS
2.0000 | ORAL_TABLET | Freq: Once | ORAL | Status: AC
  Administered 2012-02-22: 2 via ORAL
  Filled 2012-02-22: qty 2

## 2012-02-22 MED ORDER — OXYCODONE-ACETAMINOPHEN 5-325 MG PO TABS: 1.0000 | ORAL_TABLET | ORAL | Status: AC | PRN

## 2012-02-22 NOTE — ED Notes (Addendum)
Slipped on wet floor at home 1 am. Larey Seat backward. Low back pain , and  Hit head. Has Headache, No LOC, Neck is "stiff"  Alert, talking.  C Collar applied.

## 2012-02-22 NOTE — ED Notes (Signed)
Pt returned from xray

## 2012-02-22 NOTE — ED Provider Notes (Signed)
History     CSN: [id number]  Arrival date & time 02/22/12  1155   First MD Initiated Contact with Patient 02/22/12 1316      Chief Complaint  Patient presents with  . Fall    (Consider location/radiation/quality/duration/timing/severity/associated sxs/prior treatment) HPI Comments: Tina Pham is a 36 y.o. Female who accidentally fell last night. She was able to get to bed and sleep, but awoke today with persistent pain. She hurts in her head, neck, and low back. She took over-the-counter analgesics without relief. She denies paresthesias, weakness, dizziness, nausea, vomiting, blurred vision. She has a history of back pain, with surgery. No prior neck injuries, or surgery. She feels worse when she is up and walking. She gets better with rest.  Patient is a 36 y.o. female presenting with fall. The history is provided by the patient.  Fall    Past Medical History  Diagnosis Date  . Diabetes mellitus   . Depression   . Anxiety   . Hypertension   . Migraine   . High cholesterol   . Plantar fasciitis   . Neuropathic pain of foot   . Chronic back pain     Past Surgical History  Procedure Date  . Back surgery   . Eye surgery   . Tubal ligation     History reviewed. No pertinent family history.  History  Substance Use Topics  . Smoking status: Current Everyday Smoker -- 1.0 packs/day  . Smokeless tobacco: Not on file  . Alcohol Use: No     occ    OB History    Grav Para Term Preterm Abortions TAB SAB Ect Mult Living                  Review of Systems  All other systems reviewed and are negative.    Allergies  Codeine; Benadryl; and Compazine  Home Medications   Current Outpatient Rx  Name Route Sig Dispense Refill  . ACETAMINOPHEN 500 MG PO TABS Oral Take 1,500 mg by mouth every 6 (six) hours as needed. For pain    . IBUPROFEN 200 MG PO TABS Oral Take 800 mg by mouth every 6 (six) hours as needed. Pain    . METOCLOPRAMIDE HCL 10 MG PO TABS Oral  Take 1 tablet (10 mg total) by mouth every 6 (six) hours as needed (for headache or nausea). 6 tablet 0  . OXYCODONE-ACETAMINOPHEN 5-325 MG PO TABS Oral Take 1 tablet by mouth every 4 (four) hours as needed for pain. 15 tablet 0    BP 134/75  Pulse 98  Temp 97.9 F (36.6 C) (Oral)  Resp 20  Ht 5\' 9"  (1.753 m)  Wt 200 lb (90.719 kg)  BMI 29.53 kg/m2  SpO2 98%  LMP 01/29/2012  Physical Exam  Nursing note and vitals reviewed. Constitutional: She is oriented to person, place, and time. She appears well-developed and well-nourished. She appears distressed (mild).  HENT:  Head: Normocephalic and atraumatic.  Eyes: Conjunctivae and EOM are normal. Pupils are equal, round, and reactive to light.  Neck: Normal range of motion and phonation normal. Neck supple.  Cardiovascular: Normal rate, regular rhythm and intact distal pulses.   Pulmonary/Chest: Effort normal and breath sounds normal. She exhibits no tenderness.  Musculoskeletal:       Tender cervical and lumbar spines. No step-off, or deformity. Negative straight leg raising bilaterally.  Neurological: She is alert and oriented to person, place, and time. She has normal strength. She exhibits normal  muscle tone.  Skin: Skin is warm and dry.  Psychiatric: She has a normal mood and affect. Her behavior is normal. Judgment and thought content normal.    ED Course  Procedures (including critical care time)  Labs Reviewed - No data to display Dg Thoracic Spine W/swimmers  02/22/2012  *RADIOLOGY REPORT*  Clinical Data: Fall on left floor.  Back pain.  THORACIC SPINE - 2 VIEW + SWIMMERS  Comparison: 09/10/2011  Findings: No acute bony abnormality.  Specifically, no fracture or malalignment.  No significant degenerative disease.  IMPRESSION: No acute findings.  Original Report Authenticated By: Cyndie Chime, M.D.   Dg Lumbar Spine Complete  02/22/2012  *RADIOLOGY REPORT*  Clinical Data: Fall, back pain.  LUMBAR SPINE - COMPLETE 4+ VIEW   Comparison: 11/27/2011  Findings: Changes of prior anterior fusion from L5-S1.  Normal alignment.  No hardware or bony complicating feature.  No fracture. SI joints are symmetric and unremarkable.  IMPRESSION: Prior anterior fusion L5-S1.  No acute findings.  Original Report Authenticated By: Cyndie Chime, M.D.   Ct Head Wo Contrast  02/22/2012  *RADIOLOGY REPORT*  Clinical Data:  Pain.  Fall  CT HEAD WITHOUT CONTRAST CT CERVICAL SPINE WITHOUT CONTRAST  Technique:  Multidetector CT imaging of the head and cervical spine was performed following the standard protocol without intravenous contrast.  Multiplanar CT image reconstructions of the cervical spine were also generated.  Comparison:   None  CT HEAD  Findings: The brain has a normal appearance without evidence for hemorrhage, infarction, hydrocephalus, or mass lesion.  There is no extra axial fluid collection.  Retention cyst versus polyp is identified within the right maxillary sinus.  Remaining paranasal sinuses are clear.  The skull appears intact.  IMPRESSION: 1.  No acute intracranial abnormalities.  CT CERVICAL SPINE  Findings:  Normal alignment of the cervical spine. The vertebral body heights and disc spaces are well preserved.  The facet joints are well aligned.  No fractures or subluxations identified.  IMPRESSION:  1.  No acute findings.  Original Report Authenticated By: Rosealee Albee, M.D.   Ct Cervical Spine Wo Contrast  02/22/2012  *RADIOLOGY REPORT*  Clinical Data:  Pain.  Fall  CT HEAD WITHOUT CONTRAST CT CERVICAL SPINE WITHOUT CONTRAST  Technique:  Multidetector CT imaging of the head and cervical spine was performed following the standard protocol without intravenous contrast.  Multiplanar CT image reconstructions of the cervical spine were also generated.  Comparison:   None  CT HEAD  Findings: The brain has a normal appearance without evidence for hemorrhage, infarction, hydrocephalus, or mass lesion.  There is no extra axial fluid  collection.  Retention cyst versus polyp is identified within the right maxillary sinus.  Remaining paranasal sinuses are clear.  The skull appears intact.  IMPRESSION: 1.  No acute intracranial abnormalities.  CT CERVICAL SPINE  Findings:  Normal alignment of the cervical spine. The vertebral body heights and disc spaces are well preserved.  The facet joints are well aligned.  No fractures or subluxations identified.  IMPRESSION:  1.  No acute findings.  Original Report Authenticated By: Rosealee Albee, M.D.     1. Fall   2. Back injury   3. Neck pain       MDM  Accidental, mechanical fall. No serious injuries. Doubt fracture, or spinal contusion, or pre-existing cause    Plan: Home Medications- Percocet; Home Treatments- rest; Recommended follow up- PCP prn  Flint Melter, MD 02/22/12 [phone]

## 2012-03-17 ENCOUNTER — Encounter (HOSPITAL_COMMUNITY): Payer: Self-pay

## 2012-03-17 ENCOUNTER — Emergency Department (HOSPITAL_COMMUNITY)
Admission: EM | Admit: 2012-03-17 | Discharge: 2012-03-17 | Disposition: A | Discharge status: Home / Self Care | Payer: Self-pay | Attending: Emergency Medicine | Admitting: Emergency Medicine

## 2012-03-17 DIAGNOSIS — E7800 Pure hypercholesterolemia, unspecified: Secondary | ICD-10-CM | POA: Insufficient documentation

## 2012-03-17 DIAGNOSIS — I10 Essential (primary) hypertension: Secondary | ICD-10-CM | POA: Insufficient documentation

## 2012-03-17 DIAGNOSIS — E119 Type 2 diabetes mellitus without complications: Secondary | ICD-10-CM | POA: Insufficient documentation

## 2012-03-17 DIAGNOSIS — G8929 Other chronic pain: Secondary | ICD-10-CM | POA: Insufficient documentation

## 2012-03-17 DIAGNOSIS — G43909 Migraine, unspecified, not intractable, without status migrainosus: Secondary | ICD-10-CM | POA: Insufficient documentation

## 2012-03-17 DIAGNOSIS — F17200 Nicotine dependence, unspecified, uncomplicated: Secondary | ICD-10-CM | POA: Insufficient documentation

## 2012-03-17 MED ORDER — SODIUM CHLORIDE 0.9 % IV BOLUS (SEPSIS)
1000.0000 mL | Freq: Once | INTRAVENOUS | Status: AC
  Administered 2012-03-17: 1000 mL via INTRAVENOUS

## 2012-03-17 MED ORDER — HYDROMORPHONE HCL 2 MG PO TABS: 2.0000 mg | ORAL_TABLET | ORAL | Status: AC | PRN

## 2012-03-17 MED ORDER — HYDROMORPHONE HCL PF 1 MG/ML IJ SOLN
1.0000 mg | Freq: Once | INTRAMUSCULAR | Status: AC
  Administered 2012-03-17: 1 mg via INTRAVENOUS
  Filled 2012-03-17: qty 1

## 2012-03-17 MED ORDER — KETOROLAC TROMETHAMINE 30 MG/ML IJ SOLN
30.0000 mg | Freq: Once | INTRAMUSCULAR | Status: AC
  Administered 2012-03-17: 30 mg via INTRAVENOUS
  Filled 2012-03-17: qty 1

## 2012-03-17 MED ORDER — SODIUM CHLORIDE 0.9 % IV SOLN: INTRAVENOUS | Status: DC

## 2012-03-17 MED ORDER — METOCLOPRAMIDE HCL 5 MG/ML IJ SOLN
10.0000 mg | Freq: Once | INTRAMUSCULAR | Status: AC
  Administered 2012-03-17: 10 mg via INTRAVENOUS
  Filled 2012-03-17: qty 2

## 2012-03-17 NOTE — ED Notes (Signed)
Patient with no complaints at this time. Respirations even and unlabored. Skin warm/dry. Discharge instructions reviewed with patient at this time. Patient given opportunity to voice concerns/ask questions. IV removed per policy and band-aid applied to site. Patient discharged at this time and left Emergency Department with steady gait.  

## 2012-03-17 NOTE — Discharge Instructions (Signed)
Since migraine headache is improving with medications given in the emergency department is important that she rest at home is much as possible today. Prescription for a by mouth to lauded the provided as needed for recurrent headache. Followup with your regular Dr. if not improved in one to 2 days. Return for any newer worse symptoms.

## 2012-03-17 NOTE — ED Provider Notes (Signed)
History    This chart was scribed for Shelda Jakes, MD, MD by Smitty Pluck. The patient was seen in room APA08 and the patient's care was started at 3:35PM.   CSN: [id number]  Arrival date & time 03/17/12  1446   First MD Initiated Contact with Patient 03/17/12 1508      Chief Complaint  Patient presents with  . Migraine    (Consider location/radiation/quality/duration/timing/severity/associated sxs/prior treatment) Patient is a 36 y.o. female presenting with migraine. The history is provided by the patient.  Migraine This is a recurrent problem. The current episode started yesterday. The problem occurs constantly. The problem has not changed since onset.Associated symptoms include headaches. Pertinent negatives include no chest pain, no abdominal pain and no shortness of breath. Nothing aggravates the symptoms. Nothing relieves the symptoms. She has tried acetaminophen for the symptoms. The treatment provided no relief.   Tina Pham is a 36 y.o. female who presents to the Emergency Department complaining of constant, moderate migraine onset 1 day ago. pt reports headache started around right eye at onset like her usual migraines. Pt reports that the right side of her neck is stiff. Pt has hx of migraines. Pt reports having nausea but denies vomiting. Pt reports having blurred vision in her right eye. Pt denies fevers. Pt has taken tylenol and ibuprofen without relief.     Past Medical History  Diagnosis Date  . Diabetes mellitus   . Depression   . Anxiety   . Hypertension   . Migraine   . High cholesterol   . Plantar fasciitis   . Neuropathic pain of foot   . Chronic back pain     Past Surgical History  Procedure Date  . Back surgery   . Eye surgery   . Tubal ligation     No family history on file.  History  Substance Use Topics  . Smoking status: Current Everyday Smoker -- 1.0 packs/day  . Smokeless tobacco: Not on file  . Alcohol Use: No     occ     OB History    Grav Para Term Preterm Abortions TAB SAB Ect Mult Living                  Review of Systems  Constitutional: Negative for fever and chills.  HENT: Positive for neck pain.   Respiratory: Negative for shortness of breath.   Cardiovascular: Negative for chest pain.  Gastrointestinal: Negative for vomiting and abdominal pain.  Neurological: Positive for headaches. Negative for numbness.    Allergies  Codeine; Benadryl; and Compazine  Home Medications   Current Outpatient Rx  Name Route Sig Dispense Refill  . ACETAMINOPHEN 500 MG PO TABS Oral Take 1,500 mg by mouth every 6 (six) hours as needed. For pain    . IBUPROFEN 200 MG PO TABS Oral Take 800 mg by mouth every 6 (six) hours as needed. Pain    . HYDROMORPHONE HCL 2 MG PO TABS Oral Take 1 tablet (2 mg total) by mouth every 4 (four) hours as needed for pain. 15 tablet 0  . METOCLOPRAMIDE HCL 10 MG PO TABS Oral Take 1 tablet (10 mg total) by mouth every 6 (six) hours as needed (for headache or nausea). 6 tablet 0    BP 141/91  Pulse 102  Temp 98.5 F (36.9 C) (Oral)  Resp 20  SpO2 99%  LMP 02/29/2012  Physical Exam  Nursing note and vitals reviewed. Constitutional: She is oriented to person, place,  and time. She appears well-developed and well-nourished. No distress.  HENT:  Head: Normocephalic and atraumatic.  Cardiovascular: Normal rate, regular rhythm and normal heart sounds.   No murmur heard. Pulmonary/Chest: Effort normal and breath sounds normal. No respiratory distress. She has no wheezes. She has no rales.  Abdominal: Soft. Bowel sounds are normal. She exhibits no distension. There is no tenderness.  Neurological: She is alert and oriented to person, place, and time. No cranial nerve deficit.  Skin: Skin is warm and dry.  Psychiatric: She has a normal mood and affect. Her behavior is normal.    ED Course  Procedures (including critical care time) DIAGNOSTIC STUDIES: Oxygen Saturation is  99% on room air, normal by my interpretation.    COORDINATION OF CARE: 4:00PM Ordered:   Medications  0.9 %  sodium chloride infusion (not administered)  HYDROmorphone (DILAUDID) 2 MG tablet (not administered)  sodium chloride 0.9 % bolus 1,000 mL (1000 mL Intravenous Given 03/17/12 1654)  HYDROmorphone (DILAUDID) injection 1 mg (1 mg Intravenous Given 03/17/12 1702)  metoCLOPramide (REGLAN) injection 10 mg (10 mg Intravenous Given 03/17/12 1700)  ketorolac (TORADOL) 30 MG/ML injection 30 mg (30 mg Intravenous Given 03/17/12 1701)      Labs Reviewed - No data to display No results found.   1. Migraine       MDM  Symptoms consistent with typical migraine. The symptoms are improving significantly here in the emergency department the patient was given a hydromorphone Reglan and Toradol, much improved headache not completely resolved patient feels well enough and wants to go home.     I personally performed the services described in this documentation, which was scribed in my presence. The recorded information has been reviewed and considered.     Shelda Jakes, MD 03/17/12 [phone]

## 2012-03-17 NOTE — ED Notes (Signed)
Pt reports having migraine headache for 2 days, pain different from her normal, pain radiates down neck.  +nausea.

## 2012-06-22 ENCOUNTER — Emergency Department (HOSPITAL_COMMUNITY)
Admission: EM | Admit: 2012-06-22 | Discharge: 2012-06-22 | Disposition: A | Discharge status: Home / Self Care | Payer: Self-pay | Attending: Emergency Medicine | Admitting: Emergency Medicine

## 2012-06-22 ENCOUNTER — Encounter (HOSPITAL_COMMUNITY): Payer: Self-pay

## 2012-06-22 DIAGNOSIS — Z8679 Personal history of other diseases of the circulatory system: Secondary | ICD-10-CM | POA: Insufficient documentation

## 2012-06-22 DIAGNOSIS — R059 Cough, unspecified: Secondary | ICD-10-CM | POA: Insufficient documentation

## 2012-06-22 DIAGNOSIS — I10 Essential (primary) hypertension: Secondary | ICD-10-CM | POA: Insufficient documentation

## 2012-06-22 DIAGNOSIS — R05 Cough: Secondary | ICD-10-CM | POA: Insufficient documentation

## 2012-06-22 DIAGNOSIS — Z8659 Personal history of other mental and behavioral disorders: Secondary | ICD-10-CM | POA: Insufficient documentation

## 2012-06-22 DIAGNOSIS — G8929 Other chronic pain: Secondary | ICD-10-CM | POA: Insufficient documentation

## 2012-06-22 DIAGNOSIS — R509 Fever, unspecified: Secondary | ICD-10-CM | POA: Insufficient documentation

## 2012-06-22 DIAGNOSIS — R112 Nausea with vomiting, unspecified: Secondary | ICD-10-CM | POA: Insufficient documentation

## 2012-06-22 DIAGNOSIS — F17200 Nicotine dependence, unspecified, uncomplicated: Secondary | ICD-10-CM | POA: Insufficient documentation

## 2012-06-22 DIAGNOSIS — Z8669 Personal history of other diseases of the nervous system and sense organs: Secondary | ICD-10-CM | POA: Insufficient documentation

## 2012-06-22 DIAGNOSIS — R197 Diarrhea, unspecified: Secondary | ICD-10-CM | POA: Insufficient documentation

## 2012-06-22 DIAGNOSIS — IMO0001 Reserved for inherently not codable concepts without codable children: Secondary | ICD-10-CM | POA: Insufficient documentation

## 2012-06-22 DIAGNOSIS — E7800 Pure hypercholesterolemia, unspecified: Secondary | ICD-10-CM | POA: Insufficient documentation

## 2012-06-22 DIAGNOSIS — E119 Type 2 diabetes mellitus without complications: Secondary | ICD-10-CM | POA: Insufficient documentation

## 2012-06-22 MED ORDER — SODIUM CHLORIDE 0.9 % IV SOLN
1000.0000 mL | Freq: Once | INTRAVENOUS | Status: AC
  Administered 2012-06-22: 1000 mL via INTRAVENOUS

## 2012-06-22 MED ORDER — ONDANSETRON 4 MG PO TBDP: 4.0000 mg | ORAL_TABLET | Freq: Three times a day (TID) | ORAL | Status: DC | PRN

## 2012-06-22 MED ORDER — SODIUM CHLORIDE 0.9 % IV SOLN: 1000.0000 mL | Freq: Once | INTRAVENOUS | Status: DC

## 2012-06-22 MED ORDER — LOPERAMIDE HCL 2 MG PO CAPS: 2.0000 mg | ORAL_CAPSULE | Freq: Four times a day (QID) | ORAL | Status: DC | PRN

## 2012-06-22 MED ORDER — LOPERAMIDE HCL 2 MG PO CAPS
4.0000 mg | ORAL_CAPSULE | Freq: Once | ORAL | Status: AC
  Administered 2012-06-22: 4 mg via ORAL
  Filled 2012-06-22: qty 2

## 2012-06-22 MED ORDER — ONDANSETRON HCL 4 MG/2ML IJ SOLN: 4.0000 mg | Freq: Once | INTRAMUSCULAR | Status: DC

## 2012-06-22 MED ORDER — ONDANSETRON HCL 4 MG/2ML IJ SOLN
4.0000 mg | Freq: Once | INTRAMUSCULAR | Status: AC
  Administered 2012-06-22: 4 mg via INTRAVENOUS
  Filled 2012-06-22: qty 2

## 2012-06-22 MED ORDER — KETOROLAC TROMETHAMINE 30 MG/ML IJ SOLN
15.0000 mg | Freq: Once | INTRAMUSCULAR | Status: AC
  Administered 2012-06-22: 15 mg via INTRAVENOUS
  Filled 2012-06-22: qty 1

## 2012-06-22 NOTE — ED Notes (Signed)
Pt c/o generalized body aches, fever, cough, and NVD since Thursday night. Cough is nonproductive. Pt also c/o nasal congestion. Pt denies SOB, chest pain, and abdominal pain.

## 2012-06-22 NOTE — ED Provider Notes (Signed)
History     CSN: [id number]  Arrival date & time 06/22/12  1335   First MD Initiated Contact with Patient 06/22/12 1454      Chief Complaint  Patient presents with  . Influenza    (Consider location/radiation/quality/duration/timing/severity/associated sxs/prior treatment) HPILaura W Alanis is a 36 y.o. female who presents to the emergency department with nonproductive dry cough, subjective fevers, nausea and vomiting that started yesterday have abated today but continued with diarrhea. Patient's currently on her period now it started a few days ago. She's had associated myalgias and has been taking Motrin Tylenol and Mucinex and have not helped. She's currently having myalgias which are 8-10 out of intensity.  She's had no associated shortness of breath, chest pain, dizziness, lightheadedness, numbness or tingling. No dysuria or frequency.  Past Medical History  Diagnosis Date  . Diabetes mellitus   . Depression   . Anxiety   . Hypertension   . Migraine   . High cholesterol   . Plantar fasciitis   . Neuropathic pain of foot   . Chronic back pain     Past Surgical History  Procedure Date  . Back surgery   . Eye surgery   . Tubal ligation     No family history on file.  History  Substance Use Topics  . Smoking status: Current Every Day Smoker -- 1.0 packs/day    Types: Cigarettes  . Smokeless tobacco: Not on file  . Alcohol Use: No     Comment: occ    OB History    Grav Para Term Preterm Abortions TAB SAB Ect Mult Living                  Review of Systems  Allergies  Codeine; Benadryl; and Compazine  Home Medications   Current Outpatient Rx  Name  Route  Sig  Dispense  Refill  . ACETAMINOPHEN 500 MG PO TABS   Oral   Take 1,500 mg by mouth every 6 (six) hours as needed. For pain         . IBUPROFEN 200 MG PO TABS   Oral   Take 800 mg by mouth every 6 (six) hours as needed. Pain         . METOCLOPRAMIDE HCL 10 MG PO TABS   Oral   Take 1  tablet (10 mg total) by mouth every 6 (six) hours as needed (for headache or nausea).   6 tablet   0     BP 129/88  Pulse 77  Temp 98.9 F (37.2 C) (Oral)  Resp 18  Wt 220 lb (99.791 kg)  SpO2 99%  LMP 06/18/2012  Physical Exam  Nursing notes reviewed.  Electronic medical record reviewed. VITAL SIGNS:   Filed Vitals:   06/22/12 1430 06/22/12 1806  BP: 120/90 129/88  Pulse: 126 77  Temp: 98.9 F (37.2 C)   TempSrc: Oral   Resp: 22 18  Weight: 220 lb (99.791 kg)   SpO2: 99% 99%   CONSTITUTIONAL: Awake, oriented, appears non-toxic HENT: Atraumatic, normocephalic, oral mucosa pink and moist, airway patent. Nares patent without drainage. External ears normal. EYES: Conjunctiva clear, EOMI, PERRLA NECK: Trachea midline, non-tender, supple CARDIOVASCULAR: Normal heart rate, Normal rhythm, No murmurs, rubs, gallops PULMONARY/CHEST: Clear to auscultation, no rhonchi, wheezes, or rales. Symmetrical breath sounds. Non-tender. ABDOMINAL: Non-distended, obese, soft, non-tender - no rebound or guarding.  BS normal. NEUROLOGIC: Non-focal, moving all four extremities, no gross sensory or motor deficits. EXTREMITIES: No clubbing, cyanosis, or  edema SKIN: Warm, Dry, No erythema, No rash  ED Course  Procedures (including critical care time)  Labs Reviewed - No data to display No results found.   1. Nausea and vomiting   2. Diarrhea   3. Fever       MDM  ALANEY WITTER is a 36 y.o. female presenting with upper respiratory infection associated with nausea vomiting and diarrhea. She likely has a gastroenteritis and appears mildly dehydrated-does have a pulse rate of 126. She is nontoxic, has adequate blood pressure. She is young and otherwise healthy we'll treat symptomatically with Zofran and IV fluids. We'll reassess later. Do not think any labs or imaging is indicated at this time.  Pulse rate is 86 on physician review. She's feeling better we'll discharge patient home in  good condition. She'll be given Zofran to go home with. Also recommended Imodium and prescribe that to her as well as long she has not had any bloody stools.  I explained the diagnosis and have given explicit precautions to return to the ER including any other new or worsening symptoms. The patient understands and accepts the medical plan as it's been dictated and I have answered their questions. Discharge instructions concerning home care and prescriptions have been given.  The patient is STABLE and is discharged to home in good condition.         Jones Skene, MD 06/22/12 2131

## 2012-06-22 NOTE — ED Notes (Signed)
Pt reports flu-like symptoms since Thursday night.  +nausea, vomiting and diarrhea and cough

## 2012-08-17 ENCOUNTER — Emergency Department (HOSPITAL_COMMUNITY)
Admission: EM | Admit: 2012-08-17 | Discharge: 2012-08-17 | Disposition: A | Discharge status: Home / Self Care | Payer: Self-pay | Attending: Emergency Medicine | Admitting: Emergency Medicine

## 2012-08-17 ENCOUNTER — Encounter (HOSPITAL_COMMUNITY): Payer: Self-pay

## 2012-08-17 DIAGNOSIS — E119 Type 2 diabetes mellitus without complications: Secondary | ICD-10-CM | POA: Insufficient documentation

## 2012-08-17 DIAGNOSIS — Z8669 Personal history of other diseases of the nervous system and sense organs: Secondary | ICD-10-CM | POA: Insufficient documentation

## 2012-08-17 DIAGNOSIS — F17200 Nicotine dependence, unspecified, uncomplicated: Secondary | ICD-10-CM | POA: Insufficient documentation

## 2012-08-17 DIAGNOSIS — I10 Essential (primary) hypertension: Secondary | ICD-10-CM | POA: Insufficient documentation

## 2012-08-17 DIAGNOSIS — Z862 Personal history of diseases of the blood and blood-forming organs and certain disorders involving the immune mechanism: Secondary | ICD-10-CM | POA: Insufficient documentation

## 2012-08-17 DIAGNOSIS — Z8639 Personal history of other endocrine, nutritional and metabolic disease: Secondary | ICD-10-CM | POA: Insufficient documentation

## 2012-08-17 DIAGNOSIS — M546 Pain in thoracic spine: Secondary | ICD-10-CM | POA: Insufficient documentation

## 2012-08-17 DIAGNOSIS — R52 Pain, unspecified: Secondary | ICD-10-CM | POA: Insufficient documentation

## 2012-08-17 DIAGNOSIS — Z8659 Personal history of other mental and behavioral disorders: Secondary | ICD-10-CM | POA: Insufficient documentation

## 2012-08-17 DIAGNOSIS — Z8739 Personal history of other diseases of the musculoskeletal system and connective tissue: Secondary | ICD-10-CM | POA: Insufficient documentation

## 2012-08-17 DIAGNOSIS — Z8679 Personal history of other diseases of the circulatory system: Secondary | ICD-10-CM | POA: Insufficient documentation

## 2012-08-17 DIAGNOSIS — M549 Dorsalgia, unspecified: Secondary | ICD-10-CM

## 2012-08-17 DIAGNOSIS — Z79899 Other long term (current) drug therapy: Secondary | ICD-10-CM | POA: Insufficient documentation

## 2012-08-17 MED ORDER — METHOCARBAMOL 500 MG PO TABS: 500.0000 mg | ORAL_TABLET | Freq: Two times a day (BID) | ORAL | Status: DC

## 2012-08-17 MED ORDER — HYDROCODONE-ACETAMINOPHEN 5-325 MG PO TABS: 1.0000 | ORAL_TABLET | Freq: Four times a day (QID) | ORAL | Status: DC | PRN

## 2012-08-17 MED ORDER — OXYCODONE-ACETAMINOPHEN 5-325 MG PO TABS
2.0000 | ORAL_TABLET | Freq: Once | ORAL | Status: AC
  Administered 2012-08-17: 2 via ORAL
  Filled 2012-08-17: qty 2

## 2012-08-17 MED ORDER — METHOCARBAMOL 500 MG PO TABS
500.0000 mg | ORAL_TABLET | Freq: Once | ORAL | Status: AC
  Administered 2012-08-17: 500 mg via ORAL
  Filled 2012-08-17: qty 1

## 2012-08-17 MED ORDER — HYDROCODONE-ACETAMINOPHEN 5-325 MG PO TABS: 2.0000 | ORAL_TABLET | ORAL | Status: DC | PRN

## 2012-08-17 MED ORDER — IBUPROFEN 600 MG PO TABS: 600.0000 mg | ORAL_TABLET | Freq: Four times a day (QID) | ORAL | Status: DC | PRN
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
## 2012-08-17 NOTE — ED Notes (Signed)
Pt ambulated to restroom with assistance. Steady gait.

## 2012-08-18 NOTE — ED Provider Notes (Signed)
Medical screening examination/treatment/procedure(s) were performed by non-physician practitioner and as supervising physician I was immediately available for consultation/collaboration.  Gerhard Munch, MD 08/18/12 [phone]

## 2012-12-10 ENCOUNTER — Emergency Department (HOSPITAL_COMMUNITY)
Admission: EM | Admit: 2012-12-10 | Discharge: 2012-12-10 | Disposition: A | Discharge status: Home / Self Care | Payer: Self-pay | Attending: Emergency Medicine | Admitting: Emergency Medicine

## 2012-12-10 ENCOUNTER — Encounter (HOSPITAL_COMMUNITY): Payer: Self-pay | Admitting: *Deleted

## 2012-12-10 DIAGNOSIS — G43909 Migraine, unspecified, not intractable, without status migrainosus: Secondary | ICD-10-CM | POA: Insufficient documentation

## 2012-12-10 DIAGNOSIS — Z8739 Personal history of other diseases of the musculoskeletal system and connective tissue: Secondary | ICD-10-CM | POA: Insufficient documentation

## 2012-12-10 DIAGNOSIS — Z8659 Personal history of other mental and behavioral disorders: Secondary | ICD-10-CM | POA: Insufficient documentation

## 2012-12-10 DIAGNOSIS — G8929 Other chronic pain: Secondary | ICD-10-CM | POA: Insufficient documentation

## 2012-12-10 DIAGNOSIS — Z862 Personal history of diseases of the blood and blood-forming organs and certain disorders involving the immune mechanism: Secondary | ICD-10-CM | POA: Insufficient documentation

## 2012-12-10 DIAGNOSIS — Z8669 Personal history of other diseases of the nervous system and sense organs: Secondary | ICD-10-CM | POA: Insufficient documentation

## 2012-12-10 DIAGNOSIS — M549 Dorsalgia, unspecified: Secondary | ICD-10-CM | POA: Insufficient documentation

## 2012-12-10 DIAGNOSIS — R42 Dizziness and giddiness: Secondary | ICD-10-CM | POA: Insufficient documentation

## 2012-12-10 DIAGNOSIS — F17200 Nicotine dependence, unspecified, uncomplicated: Secondary | ICD-10-CM | POA: Insufficient documentation

## 2012-12-10 DIAGNOSIS — R110 Nausea: Secondary | ICD-10-CM | POA: Insufficient documentation

## 2012-12-10 DIAGNOSIS — Z8639 Personal history of other endocrine, nutritional and metabolic disease: Secondary | ICD-10-CM | POA: Insufficient documentation

## 2012-12-10 DIAGNOSIS — I10 Essential (primary) hypertension: Secondary | ICD-10-CM | POA: Insufficient documentation

## 2012-12-10 DIAGNOSIS — E119 Type 2 diabetes mellitus without complications: Secondary | ICD-10-CM | POA: Insufficient documentation

## 2012-12-10 DIAGNOSIS — J3489 Other specified disorders of nose and nasal sinuses: Secondary | ICD-10-CM | POA: Insufficient documentation

## 2012-12-10 LAB — GLUCOSE, CAPILLARY: Glucose-Capillary: 165 mg/dL — ABNORMAL HIGH (ref 70–99)

## 2012-12-10 MED ORDER — SODIUM CHLORIDE 0.9 % IV BOLUS (SEPSIS)
1000.0000 mL | Freq: Once | INTRAVENOUS | Status: AC
  Administered 2012-12-10: 1000 mL via INTRAVENOUS

## 2012-12-10 MED ORDER — HYDROCODONE-ACETAMINOPHEN 5-325 MG PO TABS: ORAL_TABLET | ORAL | Status: DC

## 2012-12-10 MED ORDER — METOCLOPRAMIDE HCL 10 MG PO TABS: 10.0000 mg | ORAL_TABLET | Freq: Four times a day (QID) | ORAL | Status: DC | PRN

## 2012-12-10 MED ORDER — METOCLOPRAMIDE HCL 5 MG/ML IJ SOLN
10.0000 mg | Freq: Once | INTRAMUSCULAR | Status: AC
  Administered 2012-12-10: 10 mg via INTRAVENOUS
  Filled 2012-12-10: qty 2

## 2012-12-10 MED ORDER — KETOROLAC TROMETHAMINE 30 MG/ML IJ SOLN
30.0000 mg | Freq: Once | INTRAMUSCULAR | Status: AC
  Administered 2012-12-10: 30 mg via INTRAVENOUS
  Filled 2012-12-10: qty 1

## 2012-12-10 MED ORDER — SODIUM CHLORIDE 0.9 % IV SOLN: INTRAVENOUS | Status: DC

## 2012-12-10 MED ORDER — HYDROMORPHONE HCL PF 1 MG/ML IJ SOLN
1.0000 mg | Freq: Once | INTRAMUSCULAR | Status: AC
  Administered 2012-12-10: 1 mg via INTRAVENOUS
  Filled 2012-12-10: qty 1

## 2012-12-10 NOTE — ED Provider Notes (Signed)
History     CSN: [id number]  Arrival date & time 12/10/12  1730   First MD Initiated Contact with Patient 12/10/12 1751      Chief Complaint  Patient presents with  . Headache     HPI Pt was seen at 1800. Per pt, c/o gradual onset and persistence of constant acute flair of her chronic migraine headache for the past several days.  Describes the headache as per her usual chronic migraine headache pain pattern for many years.  Endorses "daily" headaches "for a long time now."  Headache is located on the left side of head and neck, as been associated with nausea.  Denies headache was sudden or maximal in onset or at any time. Pt states she felt "lightheaded" and "saw spots" today when moving from sitting to standing. Denies visual changes at any other time. Denies vomiting, no eye pain, no focal motor weakness, no tingling/numbness in extremities, no fevers, no sore throat, no rash.      Past Medical History  Diagnosis Date  . Diabetes mellitus   . Depression   . Anxiety   . Hypertension   . Migraine   . High cholesterol   . Plantar fasciitis   . Neuropathic pain of foot   . Chronic back pain     Past Surgical History  Procedure Laterality Date  . Back surgery    . Eye surgery    . Tubal ligation        History  Substance Use Topics  . Smoking status: Current Every Day Smoker -- 1.00 packs/day    Types: Cigarettes  . Smokeless tobacco: Not on file  . Alcohol Use: No     Comment: occ    Review of Systems ROS: Statement: All systems negative except as marked or noted in the HPI; Constitutional: Negative for fever and chills. ; ; Eyes: Negative for eye pain, redness and discharge. ; ; ENMT: Negative for ear pain, hoarseness, nasal congestion, sinus pressure and sore throat. ; ; Cardiovascular: Negative for chest pain, palpitations, diaphoresis, dyspnea and peripheral edema. ; ; Respiratory: Negative for cough, wheezing and stridor. ; ; Gastrointestinal: +nausea. Negative  for vomiting, diarrhea, abdominal pain, blood in stool, hematemesis, jaundice and rectal bleeding. . ; ; Genitourinary: Negative for dysuria, flank pain and hematuria. ; ; Musculoskeletal: Negative for back pain and neck pain. Negative for swelling and trauma.; ; Skin: Negative for pruritus, rash, abrasions, blisters, bruising and skin lesion.; ; Neuro: +lightheadedness, headache. Negative for neck stiffness. Negative for weakness, altered level of consciousness , altered mental status, extremity weakness, paresthesias, involuntary movement, seizure and syncope.      Allergies  Codeine; Benadryl; and Compazine  Home Medications   Current Outpatient Rx  Name  Route  Sig  Dispense  Refill  . acetaminophen (TYLENOL) 500 MG tablet   Oral   Take 1,500 mg by mouth every 6 (six) hours as needed. For pain         . ibuprofen (ADVIL,MOTRIN) 600 MG tablet   Oral   Take 1 tablet (600 mg total) by mouth every 6 (six) hours as needed for pain.   30 tablet   0     BP 115/74  Pulse 101  Temp(Src) 98.9 F (37.2 C) (Oral)  Resp 20  Ht 5\' 9"  (1.753 m)  Wt 250 lb (113.399 kg)  BMI 36.9 kg/m2  SpO2 99%  LMP 11/26/2012  Physical Exam 1805: Physical examination:  Nursing notes reviewed; Vital signs and  O2 SAT reviewed;  Constitutional: Well developed, Well nourished, Well hydrated, In no acute distress; Head:  Normocephalic, atraumatic; Eyes: EOMI, PERRL, No scleral icterus; ENMT: TM's with clear fluid levels bilat. +edemetous nasal turbinates bilat with clear rhinorrhea. Mouth and pharynx normal, Mucous membranes moist; Neck: Supple, Full range of motion, No meningeal signs. No lymphadenopathy; Cardiovascular: Regular rate and rhythm, No murmur, rub, or gallop; Respiratory: Breath sounds clear & equal bilaterally, No rales, rhonchi, wheezes.  Speaking full sentences with ease, Normal respiratory effort/excursion; Chest: Nontender, Movement normal; Abdomen: Soft, Nontender, Nondistended, Normal bowel  sounds; Genitourinary: No CVA tenderness; Spine:  No midline CS, TS, LS tenderness.;; Extremities: Pulses normal, No tenderness, No edema, No calf edema or asymmetry.; Neuro: AA&Ox3, Major CN grossly intact. No facial droop. No nystagmus. Speech clear. No gross focal motor or sensory deficits in extremities.; Skin: Color normal, Warm, Dry.   ED Course  Procedures    MDM  MDM Reviewed: previous chart, nursing note and vitals Reviewed previous: CT scan Interpretation: labs     Results for orders placed during the hospital encounter of 12/10/12  GLUCOSE, CAPILLARY      Result Value Range   Glucose-Capillary 165 (*) 70 - 99 mg/dL     1610:  Pt not orthostatic on VS, though did c/o "dizziness" and "seeing spots" when moving positions that improved quickly/in seconds. Visual acuity:  OU 20/20, OS 20/20, OD 20/25. Given IVF with improvement in symptoms. Denies visual changes at any other time. No visual loss, no gross visual field cuts. States she feels "better now" and wants to go home.  Long hx of chronic headaches with multiple ED visits for same. Pt encouraged to f/u with her PMD and Pain Management doctor for good continuity of care and control of her chronic headaches.  Verb understanding.           Laray Anger, DO 12/12/12 1925

## 2012-12-10 NOTE — ED Notes (Signed)
Headache, nausea, no vomiting, lightheaded.  Pain lt side of head and neck.  Seeing black and yellow spots

## 2013-01-22 ENCOUNTER — Emergency Department (HOSPITAL_COMMUNITY)
Admission: EM | Admit: 2013-01-22 | Discharge: 2013-01-22 | Disposition: A | Discharge status: Home / Self Care | Payer: Self-pay | Attending: Emergency Medicine | Admitting: Emergency Medicine

## 2013-01-22 ENCOUNTER — Encounter (HOSPITAL_COMMUNITY): Payer: Self-pay | Admitting: *Deleted

## 2013-01-22 DIAGNOSIS — Z8679 Personal history of other diseases of the circulatory system: Secondary | ICD-10-CM | POA: Insufficient documentation

## 2013-01-22 DIAGNOSIS — S0990XA Unspecified injury of head, initial encounter: Secondary | ICD-10-CM | POA: Insufficient documentation

## 2013-01-22 DIAGNOSIS — R519 Headache, unspecified: Secondary | ICD-10-CM

## 2013-01-22 DIAGNOSIS — G8929 Other chronic pain: Secondary | ICD-10-CM | POA: Insufficient documentation

## 2013-01-22 DIAGNOSIS — R110 Nausea: Secondary | ICD-10-CM | POA: Insufficient documentation

## 2013-01-22 DIAGNOSIS — Y92009 Unspecified place in unspecified non-institutional (private) residence as the place of occurrence of the external cause: Secondary | ICD-10-CM | POA: Insufficient documentation

## 2013-01-22 DIAGNOSIS — IMO0002 Reserved for concepts with insufficient information to code with codable children: Secondary | ICD-10-CM | POA: Insufficient documentation

## 2013-01-22 DIAGNOSIS — S1093XA Contusion of unspecified part of neck, initial encounter: Secondary | ICD-10-CM | POA: Insufficient documentation

## 2013-01-22 DIAGNOSIS — M549 Dorsalgia, unspecified: Secondary | ICD-10-CM | POA: Insufficient documentation

## 2013-01-22 DIAGNOSIS — Z8639 Personal history of other endocrine, nutritional and metabolic disease: Secondary | ICD-10-CM | POA: Insufficient documentation

## 2013-01-22 DIAGNOSIS — I10 Essential (primary) hypertension: Secondary | ICD-10-CM | POA: Insufficient documentation

## 2013-01-22 DIAGNOSIS — S0083XA Contusion of other part of head, initial encounter: Secondary | ICD-10-CM

## 2013-01-22 DIAGNOSIS — F17200 Nicotine dependence, unspecified, uncomplicated: Secondary | ICD-10-CM | POA: Insufficient documentation

## 2013-01-22 DIAGNOSIS — Z8659 Personal history of other mental and behavioral disorders: Secondary | ICD-10-CM | POA: Insufficient documentation

## 2013-01-22 DIAGNOSIS — Z8669 Personal history of other diseases of the nervous system and sense organs: Secondary | ICD-10-CM | POA: Insufficient documentation

## 2013-01-22 DIAGNOSIS — E119 Type 2 diabetes mellitus without complications: Secondary | ICD-10-CM | POA: Insufficient documentation

## 2013-01-22 DIAGNOSIS — Z862 Personal history of diseases of the blood and blood-forming organs and certain disorders involving the immune mechanism: Secondary | ICD-10-CM | POA: Insufficient documentation

## 2013-01-22 DIAGNOSIS — Z8739 Personal history of other diseases of the musculoskeletal system and connective tissue: Secondary | ICD-10-CM | POA: Insufficient documentation

## 2013-01-22 DIAGNOSIS — Y939 Activity, unspecified: Secondary | ICD-10-CM | POA: Insufficient documentation

## 2013-01-22 DIAGNOSIS — S0003XA Contusion of scalp, initial encounter: Secondary | ICD-10-CM | POA: Insufficient documentation

## 2013-01-22 MED ORDER — PREDNISONE 50 MG PO TABS
60.0000 mg | ORAL_TABLET | Freq: Once | ORAL | Status: AC
  Administered 2013-01-22: 60 mg via ORAL
  Filled 2013-01-22: qty 1

## 2013-01-22 MED ORDER — BACLOFEN 10 MG PO TABS: 10.0000 mg | ORAL_TABLET | Freq: Three times a day (TID) | ORAL | Status: AC

## 2013-01-22 MED ORDER — HYDROCODONE-ACETAMINOPHEN 5-325 MG PO TABS
2.0000 | ORAL_TABLET | Freq: Once | ORAL | Status: AC
  Administered 2013-01-22: 2 via ORAL
  Filled 2013-01-22: qty 2

## 2013-01-22 MED ORDER — ONDANSETRON HCL 4 MG PO TABS
4.0000 mg | ORAL_TABLET | Freq: Once | ORAL | Status: AC
  Administered 2013-01-22: 4 mg via ORAL
  Filled 2013-01-22: qty 1

## 2013-01-22 MED ORDER — HYDROCODONE-ACETAMINOPHEN 7.5-325 MG PO TABS: ORAL_TABLET | ORAL | Status: DC

## 2013-01-22 MED ORDER — DIAZEPAM 5 MG PO TABS
5.0000 mg | ORAL_TABLET | Freq: Once | ORAL | Status: AC
  Administered 2013-01-22: 5 mg via ORAL
  Filled 2013-01-22: qty 1
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
## 2013-01-22 NOTE — ED Provider Notes (Signed)
Medical screening examination/treatment/procedure(s) were performed by non-physician practitioner and as supervising physician I was immediately available for consultation/collaboration.    Chastelyn Athens D Yvanna Vidas, MD 01/22/13 2318 

## 2013-04-02 ENCOUNTER — Emergency Department (HOSPITAL_COMMUNITY)
Admission: EM | Admit: 2013-04-02 | Discharge: 2013-04-02 | Disposition: A | Discharge status: Home / Self Care | Payer: BC Managed Care – PPO | Attending: Emergency Medicine | Admitting: Emergency Medicine

## 2013-04-02 ENCOUNTER — Emergency Department (HOSPITAL_COMMUNITY): Payer: BC Managed Care – PPO

## 2013-04-02 ENCOUNTER — Encounter (HOSPITAL_COMMUNITY): Payer: Self-pay | Admitting: *Deleted

## 2013-04-02 DIAGNOSIS — R300 Dysuria: Secondary | ICD-10-CM | POA: Insufficient documentation

## 2013-04-02 DIAGNOSIS — Z8659 Personal history of other mental and behavioral disorders: Secondary | ICD-10-CM | POA: Insufficient documentation

## 2013-04-02 DIAGNOSIS — M5430 Sciatica, unspecified side: Secondary | ICD-10-CM | POA: Insufficient documentation

## 2013-04-02 DIAGNOSIS — Z3202 Encounter for pregnancy test, result negative: Secondary | ICD-10-CM | POA: Insufficient documentation

## 2013-04-02 DIAGNOSIS — N390 Urinary tract infection, site not specified: Secondary | ICD-10-CM | POA: Insufficient documentation

## 2013-04-02 DIAGNOSIS — E119 Type 2 diabetes mellitus without complications: Secondary | ICD-10-CM | POA: Insufficient documentation

## 2013-04-02 DIAGNOSIS — Z9851 Tubal ligation status: Secondary | ICD-10-CM | POA: Insufficient documentation

## 2013-04-02 DIAGNOSIS — I10 Essential (primary) hypertension: Secondary | ICD-10-CM | POA: Insufficient documentation

## 2013-04-02 DIAGNOSIS — F17200 Nicotine dependence, unspecified, uncomplicated: Secondary | ICD-10-CM | POA: Insufficient documentation

## 2013-04-02 DIAGNOSIS — G8929 Other chronic pain: Secondary | ICD-10-CM | POA: Insufficient documentation

## 2013-04-02 DIAGNOSIS — M545 Low back pain, unspecified: Secondary | ICD-10-CM

## 2013-04-02 DIAGNOSIS — Z9889 Other specified postprocedural states: Secondary | ICD-10-CM | POA: Insufficient documentation

## 2013-04-02 DIAGNOSIS — R109 Unspecified abdominal pain: Secondary | ICD-10-CM | POA: Insufficient documentation

## 2013-04-02 HISTORY — DX: Sciatica, unspecified side: M54.30

## 2013-04-02 LAB — URINALYSIS, ROUTINE W REFLEX MICROSCOPIC
Bilirubin Urine: NEGATIVE
Glucose, UA: NEGATIVE mg/dL
Ketones, ur: NEGATIVE mg/dL
Nitrite: POSITIVE — AB
Specific Gravity, Urine: >1.03 — ABNORMAL HIGH (ref 1.005–1.030)
Urobilinogen, UA: 0.2 mg/dL (ref 0.0–1.0)
pH: 6 (ref 5.0–8.0)

## 2013-04-02 LAB — URINE MICROSCOPIC-ADD ON

## 2013-04-02 LAB — PREGNANCY, URINE: Preg Test, Ur: NEGATIVE

## 2013-04-02 MED ORDER — OXYCODONE-ACETAMINOPHEN 5-325 MG PO TABS
2.0000 | ORAL_TABLET | Freq: Once | ORAL | Status: AC
  Administered 2013-04-02: 2 via ORAL
  Filled 2013-04-02: qty 2

## 2013-04-02 MED ORDER — CEPHALEXIN 500 MG PO CAPS: 500.0000 mg | ORAL_CAPSULE | Freq: Four times a day (QID) | ORAL | Status: DC

## 2013-04-02 MED ORDER — HYDROCODONE-ACETAMINOPHEN 5-325 MG PO TABS: ORAL_TABLET | ORAL | Status: DC

## 2013-04-02 MED ORDER — CEFTRIAXONE SODIUM 1 G IJ SOLR
1.0000 g | Freq: Once | INTRAMUSCULAR | Status: AC
  Administered 2013-04-02: 1 g via INTRAMUSCULAR
  Filled 2013-04-02: qty 10

## 2013-04-02 NOTE — ED Provider Notes (Signed)
CSN: [id number]     Arrival date & time 04/02/13  2003 History   First MD Initiated Contact with Patient 04/02/13 2045     Chief Complaint  Patient presents with  . Flank Pain  . Back Pain    HPI Pt was seen at 2045. Per pt, c/o gradual onset and persistence of constant right sided low back "pain" that began today. Pain worsens with palpation of the area and body position changes. Has been associated with dysuria that began "15 minutes ago while I was in the waiting room."  Denies incont/retention of bowel or bladder, no saddle anesthesia, no focal motor weakness, no tingling/numbness in extremities, no fevers, no injury, no abd pain, no N/V/D, no vaginal bleeding/discharge.     Past Medical History  Diagnosis Date  . Diabetes mellitus   . Depression   . Anxiety   . Hypertension   . Migraine   . High cholesterol   . Plantar fasciitis   . Neuropathic pain of foot   . Chronic back pain   . Sciatica    Past Surgical History  Procedure Laterality Date  . Back surgery    . Eye surgery    . Tubal ligation      History  Substance Use Topics  . Smoking status: Current Every Day Smoker -- 1.00 packs/day    Types: Cigarettes  . Smokeless tobacco: Not on file  . Alcohol Use: No     Comment: occ    Review of Systems ROS: Statement: All systems negative except as marked or noted in the HPI; Constitutional: Negative for fever and chills. ; ; Eyes: Negative for eye pain, redness and discharge. ; ; ENMT: Negative for ear pain, hoarseness, nasal congestion, sinus pressure and sore throat. ; ; Cardiovascular: Negative for chest pain, palpitations, diaphoresis, dyspnea and peripheral edema. ; ; Respiratory: Negative for cough, wheezing and stridor. ; ; Gastrointestinal: Negative for nausea, vomiting, diarrhea, abdominal pain, blood in stool, hematemesis, jaundice and rectal bleeding. . ; ; Genitourinary: +flank pain, dysuria. Negative for hematuria. ; ; GYN:  No vaginal bleeding, no vaginal  discharge, no vulvar pain.;; Musculoskeletal: Negative for back pain and neck pain. Negative for swelling and trauma.; ; Skin: Negative for pruritus, rash, abrasions, blisters, bruising and skin lesion.; ; Neuro: Negative for headache, lightheadedness and neck stiffness. Negative for weakness, altered level of consciousness , altered mental status, extremity weakness, paresthesias, involuntary movement, seizure and syncope.       Allergies  Codeine; Benadryl; and Compazine  Home Medications   Current Outpatient Rx  Name  Route  Sig  Dispense  Refill  . acetaminophen (TYLENOL) 500 MG tablet   Oral   Take 1,500 mg by mouth every 6 (six) hours as needed. For pain         . HYDROcodone-acetaminophen (NORCO) 7.5-325 MG per tablet      1 po q4h prn headache   15 tablet   0   . ibuprofen (ADVIL,MOTRIN) 200 MG tablet   Oral   Take 800 mg by mouth every 6 (six) hours as needed for pain.          LMP 03/09/2013 Physical Exam 2050: Physical examination:  Nursing notes reviewed; Vital signs and O2 SAT reviewed;  Constitutional: Well developed, Well nourished, Well hydrated, In no acute distress; Head:  Normocephalic, atraumatic; Eyes: EOMI, PERRL, No scleral icterus; ENMT: Mouth and pharynx normal, Mucous membranes moist; Neck: Supple, Full range of motion, No lymphadenopathy; Cardiovascular: Regular rate  and rhythm, No murmur, rub, or gallop; Respiratory: Breath sounds clear & equal bilaterally, No rales, rhonchi, wheezes.  Speaking full sentences with ease, Normal respiratory effort/excursion; Chest: Nontender, Movement normal; Abdomen: Soft, Nontender, Nondistended, Normal bowel sounds; Genitourinary: No CVA tenderness; Spine:  No midline CS, TS, LS tenderness. +TTP right lumbar paraspinal muscles.;; Extremities: Pulses normal, No tenderness, No edema, No calf edema or asymmetry.; Neuro: AA&Ox3, Major CN grossly intact.  Speech clear. Climbs on and off stretcher easily by herself. Gait  steady. No gross focal motor or sensory deficits in extremities.; Skin: Color normal, Warm, Dry.   ED Course  Procedures     MDM  MDM Reviewed: previous chart, nursing note and vitals Interpretation: labs and CT scan     Results for orders placed during the hospital encounter of 04/02/13  URINALYSIS, ROUTINE W REFLEX MICROSCOPIC      Result Value Range   Color, Urine YELLOW  YELLOW   APPearance CLOUDY (*) CLEAR   Specific Gravity, Urine >1.030 (*) 1.005 - 1.030   pH 6.0  5.0 - 8.0   Glucose, UA NEGATIVE  NEGATIVE mg/dL   Hgb urine dipstick TRACE (*) NEGATIVE   Bilirubin Urine NEGATIVE  NEGATIVE   Ketones, ur NEGATIVE  NEGATIVE mg/dL   Protein, ur TRACE (*) NEGATIVE mg/dL   Urobilinogen, UA 0.2  0.0 - 1.0 mg/dL   Nitrite POSITIVE (*) NEGATIVE   Leukocytes, UA TRACE (*) NEGATIVE  PREGNANCY, URINE      Result Value Range   Preg Test, Ur NEGATIVE  NEGATIVE  URINE MICROSCOPIC-ADD ON      Result Value Range   WBC, UA TOO NUMEROUS TO COUNT  <3 WBC/hpf   RBC / HPF 0-2  <3 RBC/hpf   Bacteria, UA MANY (*) RARE    Ct Abdomen Pelvis Wo Contrast 04/02/2013   CLINICAL DATA:  Right flank pain.  EXAM: CT ABDOMEN AND PELVIS WITHOUT CONTRAST  TECHNIQUE: Multidetector CT imaging of the abdomen and pelvis was performed following the standard protocol without intravenous contrast.  COMPARISON:  10/10/2009  FINDINGS: Scarring in the lingula and right middle lobe. Lung bases otherwise clear. Heart is normal size. No effusions.  Liver, gallbladder, spleen, pancreas, adrenals and kidneys have an unremarkable unenhanced appearance. No renal or ureteral stones. No hydronephrosis. Uterus, adnexae and urinary bladder unremarkable.  Appendix is visualized and is normal. Bowel grossly unremarkable. No free air or adenopathy. Trace free fluid in the pelvis.  Calcifications scattered throughout the aorta and iliac vessels, advanced for patient's age.  Postoperative changes in the lower lumbar spine from  anterior fusion at L5-S1. No acute bony abnormality.  IMPRESSION: No renal or ureteral stones. No hydronephrosis.  Normal appendix.  No acute findings.  Age advanced calcifications in the aorta and iliac vessels.   Electronically Signed   By: Charlett Nose M.D.   On: 04/02/2013 21:38    2145:  Feels better after pain meds and wants to go home now. +UTI, UC pending. Will dose IM rocephin, rx keflex. Dx and testing d/w pt.  Questions answered.  Verb understanding, agreeable to d/c home with outpt f/u.       Laray Anger, DO 04/05/13 1431

## 2013-04-02 NOTE — ED Notes (Signed)
Pt verbalized understanding of discharge instructions. Will follow-up with health department if not better in 2 days, per pt. Pt encouraged to complete all antibiotics.

## 2013-04-02 NOTE — ED Notes (Addendum)
Pt c/o right flank pain and right lower back pain x 1 day.Pt also c/o dysuria x 15 mins ago. Denies abdominal pain and n/v.

## 2013-04-05 LAB — URINE CULTURE: Colony Count: >=100000

## 2013-04-06 ENCOUNTER — Telehealth (HOSPITAL_COMMUNITY): Payer: Self-pay | Admitting: Emergency Medicine

## 2013-04-06 NOTE — ED Notes (Signed)
Post ED Visit - Positive Culture Follow-up  Culture report reviewed by antimicrobial stewardship pharmacist: []  Tina Pham, Pharm.D., BCPS [x]  Tina Pham, Pharm.D., BCPS []  Tina Pham, [address].D., BCPS []  Tina Pham, [address].D., BCPS, AAHIVP []  Tina Pham, Pharm.D., BCPS, AAHIVP  Positive urine culture Treated with Keflex, organism sensitive to the same and no further patient follow-up is required at this time.  Tina Pham 04/06/2013, 9:43 AM

## 2013-05-20 ENCOUNTER — Emergency Department (HOSPITAL_COMMUNITY)
Admission: EM | Admit: 2013-05-20 | Discharge: 2013-05-20 | Discharge status: Against Medical Advice | Payer: BC Managed Care – PPO | Attending: Emergency Medicine | Admitting: Emergency Medicine

## 2013-05-20 ENCOUNTER — Encounter (HOSPITAL_COMMUNITY): Payer: Self-pay | Admitting: Emergency Medicine

## 2013-05-20 DIAGNOSIS — E119 Type 2 diabetes mellitus without complications: Secondary | ICD-10-CM | POA: Insufficient documentation

## 2013-05-20 DIAGNOSIS — I10 Essential (primary) hypertension: Secondary | ICD-10-CM | POA: Insufficient documentation

## 2013-05-20 DIAGNOSIS — M25569 Pain in unspecified knee: Secondary | ICD-10-CM | POA: Insufficient documentation

## 2013-05-20 LAB — GLUCOSE, CAPILLARY: Glucose-Capillary: 142 mg/dL — ABNORMAL HIGH (ref 70–99)

## 2013-05-20 NOTE — ED Notes (Signed)
No answer

## 2013-05-20 NOTE — ED Notes (Signed)
Pt c/o  Pain to behind right knee x 1-2 days. States pain from knee down to ankle. Denies injury but has hx of sciatica. Denies sob. No obvious swelling or warmth noted.

## 2013-05-20 NOTE — ED Notes (Signed)
Pt advised registration she was leaving.  

## 2013-06-03 ENCOUNTER — Emergency Department (HOSPITAL_COMMUNITY)
Admission: EM | Admit: 2013-06-03 | Discharge: 2013-06-03 | Disposition: A | Discharge status: Home / Self Care | Payer: BC Managed Care – PPO | Attending: Emergency Medicine | Admitting: Emergency Medicine

## 2013-06-03 ENCOUNTER — Encounter (HOSPITAL_COMMUNITY): Payer: Self-pay | Admitting: Emergency Medicine

## 2013-06-03 DIAGNOSIS — F17200 Nicotine dependence, unspecified, uncomplicated: Secondary | ICD-10-CM | POA: Insufficient documentation

## 2013-06-03 DIAGNOSIS — Z9889 Other specified postprocedural states: Secondary | ICD-10-CM | POA: Insufficient documentation

## 2013-06-03 DIAGNOSIS — Z792 Long term (current) use of antibiotics: Secondary | ICD-10-CM | POA: Insufficient documentation

## 2013-06-03 DIAGNOSIS — Z8659 Personal history of other mental and behavioral disorders: Secondary | ICD-10-CM | POA: Insufficient documentation

## 2013-06-03 DIAGNOSIS — I10 Essential (primary) hypertension: Secondary | ICD-10-CM | POA: Insufficient documentation

## 2013-06-03 DIAGNOSIS — Z8739 Personal history of other diseases of the musculoskeletal system and connective tissue: Secondary | ICD-10-CM | POA: Insufficient documentation

## 2013-06-03 DIAGNOSIS — R05 Cough: Secondary | ICD-10-CM | POA: Insufficient documentation

## 2013-06-03 DIAGNOSIS — R52 Pain, unspecified: Secondary | ICD-10-CM | POA: Insufficient documentation

## 2013-06-03 DIAGNOSIS — Z8669 Personal history of other diseases of the nervous system and sense organs: Secondary | ICD-10-CM | POA: Insufficient documentation

## 2013-06-03 DIAGNOSIS — E119 Type 2 diabetes mellitus without complications: Secondary | ICD-10-CM | POA: Insufficient documentation

## 2013-06-03 DIAGNOSIS — R6883 Chills (without fever): Secondary | ICD-10-CM | POA: Insufficient documentation

## 2013-06-03 DIAGNOSIS — R059 Cough, unspecified: Secondary | ICD-10-CM | POA: Insufficient documentation

## 2013-06-03 LAB — GLUCOSE, CAPILLARY: Glucose-Capillary: 182 mg/dL — ABNORMAL HIGH (ref 70–99)

## 2013-06-03 MED ORDER — BENZONATATE 100 MG PO CAPS
200.0000 mg | ORAL_CAPSULE | Freq: Once | ORAL | Status: AC
  Administered 2013-06-03: 200 mg via ORAL
  Filled 2013-06-03: qty 2

## 2013-06-03 MED ORDER — AZITHROMYCIN 250 MG PO TABS: ORAL_TABLET | ORAL | Status: DC

## 2013-06-03 MED ORDER — AZITHROMYCIN 250 MG PO TABS
500.0000 mg | ORAL_TABLET | Freq: Once | ORAL | Status: AC
  Administered 2013-06-03: 500 mg via ORAL
  Filled 2013-06-03: qty 2

## 2013-06-03 MED ORDER — BENZONATATE 100 MG PO CAPS: 100.0000 mg | ORAL_CAPSULE | Freq: Three times a day (TID) | ORAL | Status: DC

## 2013-06-03 NOTE — ED Notes (Signed)
Patient complaining of cough and generalized body aches that started last night.

## 2013-06-06 NOTE — ED Provider Notes (Signed)
CSN: [id number]     Arrival date & time 06/03/13  2026 History   First MD Initiated Contact with Patient 06/03/13 2126     Chief Complaint  Patient presents with  . Cough  . Generalized Body Aches   (Consider location/radiation/quality/duration/timing/severity/associated sxs/prior Treatment) Patient is a 37 y.o. female presenting with cough. The history is provided by the patient.  Cough Cough characteristics:  Productive and paroxysmal Sputum characteristics:  Yellow Severity:  Moderate Onset quality:  Gradual Duration:  1 day Timing:  Intermittent Progression:  Worsening Chronicity:  New Smoker: yes   Context: sick contacts and upper respiratory infection   Relieved by:  Nothing Worsened by:  Nothing tried Ineffective treatments:  None tried Associated symptoms: chills and myalgias   Associated symptoms: no ear pain, no fever, no headaches, no rash, no rhinorrhea, no shortness of breath, no sinus congestion, no sore throat and no wheezing   Myalgias:    Location:  Generalized   Quality:  Aching   Severity:  Mild   Onset quality:  Gradual   Progression:  Unchanged   Past Medical History  Diagnosis Date  . Diabetes mellitus   . Depression   . Anxiety   . Hypertension   . Migraine   . High cholesterol   . Plantar fasciitis   . Neuropathic pain of foot   . Chronic back pain   . Sciatica    Past Surgical History  Procedure Laterality Date  . Back surgery    . Eye surgery    . Tubal ligation     History reviewed. No pertinent family history. History  Substance Use Topics  . Smoking status: Current Every Day Smoker -- 1.00 packs/day    Types: Cigarettes  . Smokeless tobacco: Not on file  . Alcohol Use: No     Comment: occ   OB History   Grav Para Term Preterm Abortions TAB SAB Ect Mult Living                 Review of Systems  Constitutional: Positive for chills. Negative for fever, activity change and appetite change.  HENT: Positive for congestion.  Negative for ear pain, facial swelling, rhinorrhea, sore throat and trouble swallowing.   Eyes: Negative for visual disturbance.  Respiratory: Positive for cough. Negative for chest tightness, shortness of breath, wheezing and stridor.   Gastrointestinal: Negative for nausea, vomiting and abdominal pain.  Genitourinary: Negative for dysuria.  Musculoskeletal: Positive for myalgias. Negative for arthralgias, neck pain and neck stiffness.  Skin: Negative.  Negative for rash.  Neurological: Negative for dizziness, syncope, weakness, numbness and headaches.  Hematological: Negative for adenopathy.  Psychiatric/Behavioral: Negative for confusion.  All other systems reviewed and are negative.    Allergies  Codeine; Benadryl; and Compazine  Home Medications   Current Outpatient Rx  Name  Route  Sig  Dispense  Refill  . acetaminophen (TYLENOL) 500 MG tablet   Oral   Take 1,500 mg by mouth every 6 (six) hours as needed. For pain         . azithromycin (ZITHROMAX Z-PAK) 250 MG tablet      Take two tablets on day one, then one tab qd days 2-5   6 tablet   0   . benzonatate (TESSALON) 100 MG capsule   Oral   Take 1 capsule (100 mg total) by mouth every 8 (eight) hours.   21 capsule   0   . cephALEXin (KEFLEX) 500 MG capsule  Oral   Take 1 capsule (500 mg total) by mouth 4 (four) times daily.   40 capsule   0   . HYDROcodone-acetaminophen (NORCO) 7.5-325 MG per tablet      1 po q4h prn headache   15 tablet   0   . HYDROcodone-acetaminophen (NORCO/VICODIN) 5-325 MG per tablet      1 or 2 tabs PO q6 hours prn pain   20 tablet   0   . ibuprofen (ADVIL,MOTRIN) 200 MG tablet   Oral   Take 800 mg by mouth every 6 (six) hours as needed for pain.          BP 158/80  Pulse 84  Temp(Src) 98.7 F (37.1 C) (Oral)  Resp 20  Ht 5\' 9"  (1.753 m)  Wt 260 lb (117.935 kg)  BMI 38.38 kg/m2  SpO2 100%  LMP 06/03/2013 Physical Exam  Nursing note and vitals  reviewed. Constitutional: She is oriented to person, place, and time. She appears well-developed and well-nourished. No distress.  HENT:  Head: Normocephalic and atraumatic.  Right Ear: Tympanic membrane and ear canal normal.  Left Ear: Tympanic membrane and ear canal normal.  Mouth/Throat: Uvula is midline, oropharynx is clear and moist and mucous membranes are normal. No oropharyngeal exudate.  Eyes: EOM are normal. Pupils are equal, round, and reactive to light.  Neck: Normal range of motion, full passive range of motion without pain and phonation normal. Neck supple.  Cardiovascular: Normal rate, regular rhythm, normal heart sounds and intact distal pulses.   No murmur heard. Pulmonary/Chest: Effort normal. No stridor. No respiratory distress. She has no rales. She exhibits no tenderness.  Coarse lungs sounds bilaterally. No wheezing or rales.  Abdominal: Soft. She exhibits no distension. There is no tenderness. There is no rebound and no guarding.  Musculoskeletal: She exhibits no edema.  Lymphadenopathy:    She has no cervical adenopathy.  Neurological: She is alert and oriented to person, place, and time. She exhibits normal muscle tone. Coordination normal.  Skin: Skin is warm and dry.    ED Course  Procedures (including critical care time) Labs Review Labs Reviewed  GLUCOSE, CAPILLARY - Abnormal; Notable for the following:    Glucose-Capillary 182 (*)    All other components within normal limits   Imaging Review No results found.  EKG Interpretation   None       MDM   1. Cough    Vital signs are stable. Patient is nontoxic appearing. PERC negative.  Patient agrees to treatment with Tessalon Perles and Zithromax. She also agrees to followup with her primary care physician if needed. She appears stable for discharge.   Audreena Sachdeva L. Emalina Dubreuil, PA-C 06/06/13 0009

## 2013-06-08 NOTE — ED Provider Notes (Signed)
Medical screening examination/treatment/procedure(s) were performed by non-physician practitioner and as supervising physician I was immediately available for consultation/collaboration.  EKG Interpretation   None         Thorvald Orsino W Natalin Bible, MD 06/08/13 0750 

## 2013-06-27 ENCOUNTER — Encounter (HOSPITAL_COMMUNITY): Payer: Self-pay | Admitting: Emergency Medicine

## 2013-06-27 ENCOUNTER — Emergency Department (HOSPITAL_COMMUNITY)
Admission: EM | Admit: 2013-06-27 | Discharge: 2013-06-27 | Disposition: A | Discharge status: Home / Self Care | Payer: BC Managed Care – PPO | Attending: Emergency Medicine | Admitting: Emergency Medicine

## 2013-06-27 DIAGNOSIS — Z8659 Personal history of other mental and behavioral disorders: Secondary | ICD-10-CM | POA: Insufficient documentation

## 2013-06-27 DIAGNOSIS — I10 Essential (primary) hypertension: Secondary | ICD-10-CM | POA: Insufficient documentation

## 2013-06-27 DIAGNOSIS — M5430 Sciatica, unspecified side: Secondary | ICD-10-CM | POA: Insufficient documentation

## 2013-06-27 DIAGNOSIS — Z792 Long term (current) use of antibiotics: Secondary | ICD-10-CM | POA: Insufficient documentation

## 2013-06-27 DIAGNOSIS — F17200 Nicotine dependence, unspecified, uncomplicated: Secondary | ICD-10-CM | POA: Insufficient documentation

## 2013-06-27 DIAGNOSIS — E119 Type 2 diabetes mellitus without complications: Secondary | ICD-10-CM | POA: Insufficient documentation

## 2013-06-27 DIAGNOSIS — G43909 Migraine, unspecified, not intractable, without status migrainosus: Secondary | ICD-10-CM | POA: Insufficient documentation

## 2013-06-27 DIAGNOSIS — G8929 Other chronic pain: Secondary | ICD-10-CM | POA: Insufficient documentation

## 2013-06-27 LAB — GLUCOSE, CAPILLARY: Glucose-Capillary: 174 mg/dL — ABNORMAL HIGH (ref 70–99)

## 2013-06-27 MED ORDER — METOCLOPRAMIDE HCL 5 MG/ML IJ SOLN
10.0000 mg | Freq: Once | INTRAMUSCULAR | Status: AC
  Administered 2013-06-27: 10 mg via INTRAMUSCULAR
  Filled 2013-06-27: qty 2

## 2013-06-27 MED ORDER — KETOROLAC TROMETHAMINE 60 MG/2ML IM SOLN
60.0000 mg | Freq: Once | INTRAMUSCULAR | Status: AC
  Administered 2013-06-27: 60 mg via INTRAMUSCULAR
  Filled 2013-06-27: qty 2

## 2013-06-27 MED ORDER — HYDROMORPHONE HCL PF 1 MG/ML IJ SOLN
1.0000 mg | Freq: Once | INTRAMUSCULAR | Status: AC
  Administered 2013-06-27: 1 mg via INTRAMUSCULAR
  Filled 2013-06-27: qty 1

## 2013-06-27 MED ORDER — METOCLOPRAMIDE HCL 10 MG PO TABS: 10.0000 mg | ORAL_TABLET | Freq: Four times a day (QID) | ORAL | Status: DC | PRN

## 2013-06-27 NOTE — ED Notes (Signed)
Pt c/o migraine headache since Tuesday with nausea.  Reports history of migraine.

## 2013-06-27 NOTE — ED Provider Notes (Signed)
CSN: [id number]     Arrival date & time 06/27/13  1831 History   First MD Initiated Contact with Patient 06/27/13 1959     Chief Complaint  Patient presents with  . Headache    HPI Pt was seen at 2005. Per pt, c/o gradual onset and persistence of constant acute flair of her chronic migraine headache since last night. Has been associated with nausea. Describes the headache as per her usual chronic migraine headache pain pattern for many years.  Denies headache was sudden or maximal in onset or at any time.  Denies visual changes, no focal motor weakness, no tingling/numbness in extremities, no vomiting/diarrhea, no fevers, no neck pain, no rash. The symptoms have been associated with no other complaints. The patient has a significant history of similar symptoms previously, recently being evaluated for this complaint and multiple prior evals for same.       Past Medical History  Diagnosis Date  . Diabetes mellitus   . Depression   . Anxiety   . Hypertension   . Migraine   . High cholesterol   . Plantar fasciitis   . Neuropathic pain of foot   . Chronic back pain   . Sciatica    Past Surgical History  Procedure Laterality Date  . Back surgery    . Eye surgery    . Tubal ligation      History  Substance Use Topics  . Smoking status: Current Every Day Smoker -- 1.00 packs/day    Types: Cigarettes  . Smokeless tobacco: Not on file  . Alcohol Use: No     Comment: occ    Review of Systems ROS: Statement: All systems negative except as marked or noted in the HPI; Constitutional: Negative for fever and chills. ; ; Eyes: Negative for eye pain, redness and discharge. ; ; ENMT: Negative for ear pain, hoarseness, nasal congestion, sinus pressure and sore throat. ; ; Cardiovascular: Negative for chest pain, palpitations, diaphoresis, dyspnea and peripheral edema. ; ; Respiratory: Negative for cough, wheezing and stridor. ; ; Gastrointestinal: +nausea. Negative for vomiting, diarrhea,  abdominal pain, blood in stool, hematemesis, jaundice and rectal bleeding. . ; ; Genitourinary: Negative for dysuria, flank pain and hematuria. ; ; Musculoskeletal: Negative for back pain and neck pain. Negative for swelling and trauma.; ; Skin: Negative for pruritus, rash, abrasions, blisters, bruising and skin lesion.; ; Neuro: +headache. Negative for lightheadedness and neck stiffness. Negative for weakness, altered level of consciousness , altered mental status, extremity weakness, paresthesias, involuntary movement, seizure and syncope.       Allergies  Codeine; Benadryl; and Compazine  Home Medications   Current Outpatient Rx  Name  Route  Sig  Dispense  Refill  . acetaminophen (TYLENOL) 500 MG tablet   Oral   Take 1,500 mg by mouth every 6 (six) hours as needed. For pain         . azithromycin (ZITHROMAX Z-PAK) 250 MG tablet      Take two tablets on day one, then one tab qd days 2-5   6 tablet   0   . benzonatate (TESSALON) 100 MG capsule   Oral   Take 1 capsule (100 mg total) by mouth every 8 (eight) hours.   21 capsule   0   . cephALEXin (KEFLEX) 500 MG capsule   Oral   Take 1 capsule (500 mg total) by mouth 4 (four) times daily.   40 capsule   0   . HYDROcodone-acetaminophen (NORCO) 7.5-325  MG per tablet      1 po q4h prn headache   15 tablet   0   . HYDROcodone-acetaminophen (NORCO/VICODIN) 5-325 MG per tablet      1 or 2 tabs PO q6 hours prn pain   20 tablet   0   . ibuprofen (ADVIL,MOTRIN) 200 MG tablet   Oral   Take 800 mg by mouth every 6 (six) hours as needed for pain.          BP 129/75  Pulse 102  Temp(Src) 98.4 F (36.9 C) (Oral)  Resp 18  Ht 5\' 9"  (1.753 m)  Wt 260 lb (117.935 kg)  BMI 38.38 kg/m2  SpO2 99%  LMP 06/03/2013 Physical Exam 2010: Physical examination:  Nursing notes reviewed; Vital signs and O2 SAT reviewed;  Constitutional: Well developed, Well nourished, Well hydrated, In no acute distress; Head:  Normocephalic,  atraumatic; Eyes: EOMI, PERRL, No scleral icterus; ENMT: TM's clear bilat. +edemetous nasal turbinates bilat with clear rhinorrhea. Mouth and pharynx normal, Mucous membranes moist; Neck: Supple, Full range of motion, No lymphadenopathy; Cardiovascular: Regular rate and rhythm, No murmur, rub, or gallop; Respiratory: Breath sounds clear & equal bilaterally, No rales, rhonchi, wheezes.  Speaking full sentences with ease, Normal respiratory effort/excursion; Chest: Nontender, Movement normal; Abdomen: Soft, Nontender, Nondistended, Normal bowel sounds; Genitourinary: No CVA tenderness; Extremities: Pulses normal, No tenderness, No edema, No calf edema or asymmetry.; Neuro: AA&Ox3, Major CN grossly intact.  Speech clear. Climbs on and off stretcher easily by herself. Gait steady. No gross focal motor or sensory deficits in extremities.; Skin: Color normal, Warm, Dry.   ED Course  Procedures   EKG Interpretation   None       MDM  MDM Reviewed: previous chart, nursing note and vitals     2030:  Long hx of chronic pain with multiple ED visits for same.  Pt endorses acute flair of her usual long standing chronic headache pain today, no change from herusual chronic pain pattern.  Pt encouraged to f/u with her PMD and Pain Management doctor for good continuity of care and control of her chronic headaches.  Verb understanding.     Laray Anger, DO 06/28/13 1528

## 2013-07-27 ENCOUNTER — Encounter (HOSPITAL_COMMUNITY): Payer: Self-pay | Admitting: Emergency Medicine

## 2013-07-27 ENCOUNTER — Emergency Department (HOSPITAL_COMMUNITY)
Admission: EM | Admit: 2013-07-27 | Discharge: 2013-07-27 | Disposition: A | Discharge status: Home / Self Care | Payer: BC Managed Care – PPO | Attending: Emergency Medicine | Admitting: Emergency Medicine

## 2013-07-27 DIAGNOSIS — M25519 Pain in unspecified shoulder: Secondary | ICD-10-CM | POA: Insufficient documentation

## 2013-07-27 DIAGNOSIS — R450 Nervousness: Secondary | ICD-10-CM | POA: Insufficient documentation

## 2013-07-27 DIAGNOSIS — M25511 Pain in right shoulder: Secondary | ICD-10-CM

## 2013-07-27 DIAGNOSIS — M549 Dorsalgia, unspecified: Secondary | ICD-10-CM | POA: Insufficient documentation

## 2013-07-27 DIAGNOSIS — R51 Headache: Secondary | ICD-10-CM | POA: Insufficient documentation

## 2013-07-27 DIAGNOSIS — Z8669 Personal history of other diseases of the nervous system and sense organs: Secondary | ICD-10-CM | POA: Insufficient documentation

## 2013-07-27 DIAGNOSIS — F17200 Nicotine dependence, unspecified, uncomplicated: Secondary | ICD-10-CM | POA: Insufficient documentation

## 2013-07-27 DIAGNOSIS — I10 Essential (primary) hypertension: Secondary | ICD-10-CM | POA: Insufficient documentation

## 2013-07-27 DIAGNOSIS — G8929 Other chronic pain: Secondary | ICD-10-CM | POA: Insufficient documentation

## 2013-07-27 DIAGNOSIS — F411 Generalized anxiety disorder: Secondary | ICD-10-CM | POA: Insufficient documentation

## 2013-07-27 DIAGNOSIS — Z9889 Other specified postprocedural states: Secondary | ICD-10-CM | POA: Insufficient documentation

## 2013-07-27 DIAGNOSIS — E119 Type 2 diabetes mellitus without complications: Secondary | ICD-10-CM | POA: Insufficient documentation

## 2013-07-27 MED ORDER — KETOROLAC TROMETHAMINE 10 MG PO TABS
10.0000 mg | ORAL_TABLET | Freq: Once | ORAL | Status: AC
  Administered 2013-07-27: 10 mg via ORAL
  Filled 2013-07-27: qty 1

## 2013-07-27 MED ORDER — DEXAMETHASONE 6 MG PO TABS: ORAL_TABLET | ORAL | Status: DC

## 2013-07-27 MED ORDER — PREDNISONE 50 MG PO TABS
60.0000 mg | ORAL_TABLET | Freq: Once | ORAL | Status: AC
  Administered 2013-07-27: 22:00:00 60 mg via ORAL
  Filled 2013-07-27 (×2): qty 1

## 2013-07-27 MED ORDER — DICLOFENAC SODIUM 75 MG PO TBEC: 75.0000 mg | DELAYED_RELEASE_TABLET | Freq: Two times a day (BID) | ORAL | Status: DC

## 2013-07-27 MED ORDER — HYDROCODONE-ACETAMINOPHEN 5-325 MG PO TABS: 1.0000 | ORAL_TABLET | ORAL | Status: DC | PRN

## 2013-07-27 MED ORDER — HYDROCODONE-ACETAMINOPHEN 5-325 MG PO TABS
2.0000 | ORAL_TABLET | Freq: Once | ORAL | Status: AC
  Administered 2013-07-27: 2 via ORAL
  Filled 2013-07-27: qty 2

## 2013-07-27 NOTE — ED Notes (Signed)
Pt with "knot" to R posterior shoulder area, onset around Thanksgiving. Intermittent pain until Fri when pain became much worse. Pt reports difficulty moving her arm.

## 2013-07-27 NOTE — Discharge Instructions (Signed)
Your examination suggested possible strain sprain of your shoulder, or possible rotator cuff injury. Please use the shoulder immobilizer until seen by orthopedics. Please use Decadron and Voltaren daily with food. Use Norco for pain if needed. This medication may cause drowsiness, please use with caution. Shoulder Pain The shoulder is the joint that connects your arm to your body. Muscles and band-like tissues that connect bones to muscles (tendons) hold the joint together. Shoulder pain is felt if an injury or medical problem affects one or more parts of the shoulder. HOME CARE   Put ice on the sore area.  Put ice in a plastic bag.  Place a towel between your skin and the bag.  Leave the ice on for 15-20 minutes, 03-04 times a day for the first 2 days.  Stop using cold packs if they do not help with the pain.  If you were given something to keep your shoulder from moving (sling, shoulder immobilizer), wear it as told. Only take it off to shower or bathe.  Move your arm as little as possible, but keep your hand moving to prevent puffiness (swelling).  Squeeze a soft ball or foam pad as much as possible to help prevent swelling.  Take medicine as told by your doctor. GET HELP RIGHT AWAY IF:   Your arm, hand, or fingers are numb or tingling.  Your arm, hand, or fingers are puffy (swollen), painful, or turn white or blue.  You have more pain.  You have progressing new pain in your arm, hand, or fingers.  Your hand or fingers get cold.  Your medicine does not help lessen your pain. MAKE SURE YOU:   Understand these instructions.  Will watch your condition.  Will get help right away if you are not doing well or get worse. Document Released: 12/27/2007 Document Revised: 04/03/2012 Document Reviewed: 01/22/2012 [facility]Exit[facility] Patient Information 2014 [place], [place].

## 2013-07-27 NOTE — ED Provider Notes (Signed)
CSN: [id number]     Arrival date & time 07/27/13  1956 History   First MD Initiated Contact with Patient 07/27/13 2050     Chief Complaint  Patient presents with  . Shoulder Pain   (Consider location/radiation/quality/duration/timing/severity/associated sxs/prior Treatment) Patient is a 38 y.o. female presenting with shoulder pain. The history is provided by the patient.  Shoulder Pain This is a new problem. Associated symptoms include headaches. Pertinent negatives include no abdominal pain, arthralgias, chest pain, coughing or neck pain.    Past Medical History  Diagnosis Date  . Diabetes mellitus   . Depression   . Anxiety   . Hypertension   . Migraine   . High cholesterol   . Plantar fasciitis   . Neuropathic pain of foot   . Chronic back pain   . Sciatica    Past Surgical History  Procedure Laterality Date  . Back surgery    . Eye surgery    . Tubal ligation     Family History  Problem Relation Age of Onset  . Heart failure Mother   . COPD Mother   . Diabetes Mother   . Heart failure Father   . Diabetes Other    History  Substance Use Topics  . Smoking status: Current Every Day Smoker -- 1.00 packs/day    Types: Cigarettes  . Smokeless tobacco: Not on file  . Alcohol Use: No     Comment: occ   OB History   Grav Para Term Preterm Abortions TAB SAB Ect Mult Living                 Review of Systems  Constitutional: Negative for activity change.       All ROS Neg except as noted in HPI  HENT: Negative for nosebleeds.   Eyes: Negative for photophobia and discharge.  Respiratory: Negative for cough, shortness of breath and wheezing.   Cardiovascular: Negative for chest pain and palpitations.  Gastrointestinal: Negative for abdominal pain and blood in stool.  Genitourinary: Negative for dysuria, frequency and hematuria.  Musculoskeletal: Positive for back pain. Negative for arthralgias and neck pain.  Skin: Negative.   Neurological: Positive for headaches.  Negative for dizziness, seizures and speech difficulty.  Psychiatric/Behavioral: Negative for hallucinations and confusion. The patient is nervous/anxious.     Allergies  Codeine; Benadryl; and Compazine  Home Medications   Current Outpatient Rx  Name  Route  Sig  Dispense  Refill  . acetaminophen (TYLENOL) 500 MG tablet   Oral   Take 1,500 mg by mouth every 6 (six) hours as needed. For pain         . ibuprofen (ADVIL,MOTRIN) 200 MG tablet   Oral   Take 800 mg by mouth every 6 (six) hours as needed for pain.          BP 138/86  Pulse 87  Temp(Src) 98.6 F (37 C) (Oral)  Resp 16  Ht 5\' 9"  (1.753 m)  Wt 265 lb (120.203 kg)  BMI 39.12 kg/m2  SpO2 100%  LMP 07/06/2013 Physical Exam  Nursing note and vitals reviewed. Constitutional: She is oriented to person, place, and time. She appears well-developed and well-nourished.  Non-toxic appearance.  HENT:  Head: Normocephalic.  Right Ear: Tympanic membrane and external ear normal.  Left Ear: Tympanic membrane and external ear normal.  Eyes: EOM and lids are normal. Pupils are equal, round, and reactive to light.  Neck: Normal range of motion. Neck supple. Carotid bruit is not present.  Cardiovascular: Normal rate, regular rhythm, normal heart sounds, intact distal pulses and normal pulses.   Pulmonary/Chest: Breath sounds normal. No respiratory distress.  Abdominal: Soft. Bowel sounds are normal. There is no tenderness. There is no guarding.  Musculoskeletal: Normal range of motion.  There is full range of motion of the fingers and wrist of the right upper extremity. The capillary refill is less than 2 seconds. Radial pulses are 2+ and symmetrical. There is full range of motion of the right and left elbow. There is pain to attempted movement of the right shoulder. There is soreness to palpation of the upper portion of the right trapezius. There is question of a fatty cyst at the Center of the right trapezius. There is pain to  palpation, adduction, and abduction. No hot areas appreciated.  Lymphadenopathy:       Head (right side): No submandibular adenopathy present.       Head (left side): No submandibular adenopathy present.    She has no cervical adenopathy.  Neurological: She is alert and oriented to person, place, and time. She has normal strength. No cranial nerve deficit or sensory deficit.  Grip is symmetrical. No muscle atrophy noted of the right upper extremity.  Skin: Skin is warm and dry.  Psychiatric: She has a normal mood and affect. Her speech is normal.    ED Course  Procedures (including critical care time) Labs Review Labs Reviewed - No data to display Imaging Review No results found.  EKG Interpretation   None       MDM  No diagnosis found. *I have reviewed nursing notes, vital signs, and all appropriate lab and imaging results for this patient.**  Patient presents to the emergency department with what she thought to be a knot on the right posterior portion of the shoulder. Examination reveals a possible fatty cyst in this area. Patient also noted to have pain to palpation anteriorly, as well as pain anteriorly with abduction. Suspect the patient has a shoulder strain, sprain, or rotator cuff injury.  Patient fitted with a shoulder immobilizer. Prescription for Decadron 6 mg, diclofenac 75 mg, and Norco 5 mg given to the patient. Patient is referred to Dr. Romeo AppleHarrison for orthopedic evaluation.  Kathie DikeHobson M Susana Duell, PA-C 07/27/13 2214

## 2013-07-28 NOTE — ED Provider Notes (Signed)
Medical screening examination/treatment/procedure(s) were performed by non-physician practitioner and as supervising physician I was immediately available for consultation/collaboration.  EKG Interpretation   None       Devoria AlbeIva Sofya Moustafa, MD, Armando GangFACEP   Ward GivensIva L Ahtziry Saathoff, MD 07/28/13 [phone]

## 2013-08-19 ENCOUNTER — Emergency Department (HOSPITAL_COMMUNITY)
Admission: EM | Admit: 2013-08-19 | Discharge: 2013-08-19 | Disposition: A | Discharge status: Home / Self Care | Payer: BC Managed Care – PPO | Attending: Emergency Medicine | Admitting: Emergency Medicine

## 2013-08-19 ENCOUNTER — Encounter (HOSPITAL_COMMUNITY): Payer: Self-pay | Admitting: Emergency Medicine

## 2013-08-19 DIAGNOSIS — R51 Headache: Secondary | ICD-10-CM

## 2013-08-19 DIAGNOSIS — M5430 Sciatica, unspecified side: Secondary | ICD-10-CM | POA: Insufficient documentation

## 2013-08-19 DIAGNOSIS — G8929 Other chronic pain: Secondary | ICD-10-CM | POA: Insufficient documentation

## 2013-08-19 DIAGNOSIS — M722 Plantar fascial fibromatosis: Secondary | ICD-10-CM | POA: Insufficient documentation

## 2013-08-19 DIAGNOSIS — Z8659 Personal history of other mental and behavioral disorders: Secondary | ICD-10-CM | POA: Insufficient documentation

## 2013-08-19 DIAGNOSIS — E119 Type 2 diabetes mellitus without complications: Secondary | ICD-10-CM | POA: Insufficient documentation

## 2013-08-19 DIAGNOSIS — R519 Headache, unspecified: Secondary | ICD-10-CM

## 2013-08-19 DIAGNOSIS — E7800 Pure hypercholesterolemia, unspecified: Secondary | ICD-10-CM | POA: Insufficient documentation

## 2013-08-19 DIAGNOSIS — I10 Essential (primary) hypertension: Secondary | ICD-10-CM | POA: Insufficient documentation

## 2013-08-19 DIAGNOSIS — F17200 Nicotine dependence, unspecified, uncomplicated: Secondary | ICD-10-CM | POA: Insufficient documentation

## 2013-08-19 DIAGNOSIS — G43909 Migraine, unspecified, not intractable, without status migrainosus: Secondary | ICD-10-CM | POA: Insufficient documentation

## 2013-08-19 MED ORDER — SODIUM CHLORIDE 0.9 % IV SOLN
1000.0000 mL | Freq: Once | INTRAVENOUS | Status: AC
Start: 2013-08-19 — End: 2013-08-19
  Administered 2013-08-19: 1000 mL via INTRAVENOUS

## 2013-08-19 MED ORDER — SODIUM CHLORIDE 0.9 % IV SOLN
1000.0000 mL | INTRAVENOUS | Status: DC
  Administered 2013-08-19: 1000 mL via INTRAVENOUS

## 2013-08-19 MED ORDER — LORAZEPAM 2 MG/ML IJ SOLN
1.0000 mg | Freq: Once | INTRAMUSCULAR | Status: AC
  Administered 2013-08-19: 1 mg via INTRAVENOUS
  Filled 2013-08-19: qty 1

## 2013-08-19 MED ORDER — METOCLOPRAMIDE HCL 5 MG/ML IJ SOLN
10.0000 mg | Freq: Once | INTRAMUSCULAR | Status: AC
  Administered 2013-08-19: 10 mg via INTRAVENOUS
  Filled 2013-08-19: qty 2

## 2013-08-19 MED ORDER — KETOROLAC TROMETHAMINE 30 MG/ML IJ SOLN
30.0000 mg | Freq: Once | INTRAMUSCULAR | Status: AC
  Administered 2013-08-19: 30 mg via INTRAVENOUS
  Filled 2013-08-19: qty 1

## 2013-08-19 NOTE — ED Notes (Signed)
Migraine headache with nausea x 2 days.  Denies v/visual disturbances.  Took Ibuprofen approx 1.5 hrs ago with no relief.

## 2013-08-19 NOTE — Discharge Instructions (Signed)
Go home and rest. Consider seeing Dr Gerilyn Pilgrimoonquah, a neurologist about your chronic headaches. Sometimes a preventative medication will help. Also stopping smoking can help. Look at the resource quide to get a primary care doctor.     Emergency Department Resource Guide 1) Find a Doctor and Pay Out of Pocket Although you won't have to find out who is covered by your insurance plan, it is a good idea to ask around and get recommendations. You will then need to call the office and see if the doctor you have chosen will accept you as a new patient and what types of options they offer for patients who are self-pay. Some doctors offer discounts or will set up payment plans for their patients who do not have insurance, but you will need to ask so you aren't surprised when you get to your appointment.  2) Contact Your Local Health Department Not all health departments have doctors that can see patients for sick visits, but many do, so it is worth a call to see if yours does. If you don't know where your local health department is, you can check in your phone book. The CDC also has a tool to help you locate your state's health department, and many state websites also have listings of all of their local health departments.  3) Find a Walk-in Clinic If your illness is not likely to be very severe or complicated, you may want to try a walk in clinic. These are popping up all over the country in pharmacies, drugstores, and shopping centers. They're usually staffed by nurse practitioners or physician assistants that have been trained to treat common illnesses and complaints. They're usually fairly quick and inexpensive. However, if you have serious medical issues or chronic medical problems, these are probably not your best option.  No Primary Care Doctor: - Call Health Connect at  [phone] - they can help you locate a primary care doctor that  accepts your insurance, provides certain services, etc. - Physician  Referral Service- [phone]  Chronic Pain Problems: Organization         Address  Phone   Notes  [facility]  [phone] Patients need to be referred by their primary care doctor.   Medication Assistance: Organization         Address  Phone   Notes  [facility] Medication [facility] [address] [place], [place] [address] [phone] --Must be a resident of [facility] [place] -- Must have NO insurance coverage whatsoever (no Medicaid/ Medicare, etc.) -- The pt. MUST have a primary care doctor that directs their care regularly and follows them in the community   MedAssist  [phone]   [employer]  [phone]    Agencies that provide inexpensive medical care: Organization         Address  Phone   Notes  [facility]  [phone]) [phone]   [facility]    [phone]   [facility] [address] [place], [place] [address] [phone]   [facility] [address], [place] [phone]   Planned Parenthood    [phone]   [facility]    [phone]   [facility]  [address], [place] Phone:  [phone], Fax:  [phone] Hours of Operation:  9 am - 6 pm, M-F.  Also accepts Medicaid/Medicare and self-pay.  [facility] for [place], [address], [place] Phone: [phone], Fax: [phone]. Hours of Operation:  8:30 am - 5:30 pm, M-F.  Also accepts Medicaid and self-pay.  [facility] [address] [place] Phone: [phone]   [place], [place], [place] [phone], Ext. [phone] Mondays & Thursdays: 7-9 AM.  First 15 patients are seen on a first come, first serve basis.    [place] Providers:  Organization         Address  Phone   Notes  [facility] [address], [place] [phone] Also accepts self-pay patients.  [facility] [address], [place]  [phone]   [place], [address], [place] [phone]   [facility] [address], [place] [phone]   Lucianne Lei [address], [place]   [phone] Only accepts [place] Access [place] patients after they have their name applied to their card.   Self-Pay (no insurance) in [facility]:  Organization         Address  Phone   Notes  Sickle Cell Patients, [facility] Internal Medicine [place] [phone]   [facility] [place] [phone]   [facility] [place]  [place], [place], [place] [phone]   [facility]/Dr. Osei-Bonsu  [address] [place] or [address], [place] [phone] Phone number for both [place] and [place] locations is the same.  [facility] [address] [place] [phone]   [facility] [address] [place] or [address] [phone]   [facility] [address] [place] [phone], phone; [phone], fax Sees patients 1st and 3rd Saturday of every month.  Must not qualify for public or private insurance (i.e. Medicaid, Medicare, [place] Health Choice, Veterans' Benefits)  Household income should be no more than 200% of the poverty level The clinic cannot treat you if you are pregnant or think you are pregnant  Sexually transmitted diseases are not treated at the clinic.    Dental Care: Organization         Address  Phone  Notes  [facility] [place] [phone] Accepts children up to age 84 who are enrolled  in [place] or [place]; pregnant women with a Medicaid card; and children who have applied for Medicaid or [place] Health Choice, but were declined, whose parents can pay a reduced fee at time of service.  [facility]  [address], [place] [phone] Accepts children up to age 33 who are enrolled in [place] or [place]; pregnant women with a Medicaid card; and children who have applied for Medicaid or [place] Health Choice, but were declined, whose parents can pay a reduced fee at time of service.  [facility] Access PROGRAM  [place] [phone] Patients are seen by appointment only. Walk-ins are not accepted. [place] will see patients 49 years of age and older. Monday - Tuesday (8am-5pm) Most Wednesdays (8:30-5pm) $30 per  visit, cash only  [facility] Adult [employer] PROGRAM  [address], [facility] [phone] Patients are seen by appointment only. Walk-ins are not accepted. [place] will see patients 75 years of age and older. One Wednesday Evening (Monthly: Volunteer Based).  $30 per visit, cash only  [place]  [phone] for adults; Children under age 64, call Graduate Pediatric Dentistry at [phone]. Children aged 33-14, please call [phone] to request a pediatric application.  Dental services are provided in all areas of dental care including fillings, crowns and bridges, complete and partial dentures, implants, gum treatment, root canals, and extractions. Preventive care is also provided. Treatment is provided to both adults and children. Patients are selected via a lottery and there is often a waiting list.   [facility] [address], [place]  [phone] [email or web address]   [facility] [address] [place], [place] [phone], Ext. [phone] Second and Fourth Thursday of each month, opens at  6:30 AM; Clinic ends at 9 AM.  Patients are seen on a first-come first-served basis, and a limited number are seen during each clinic.   [facility]  [address] Hillard Danker [place], [place] [phone]   Eligibility Requirements You must have lived in [place], [place], or [place] counties for at least the last three months.   You cannot be eligible for state or federal sponsored [employer], including [employer], [place], or [employer].   You generally cannot be eligible for healthcare insurance through your employer.    How to apply: Eligibility screenings are held every Tuesday and Wednesday afternoon from 1:00 pm until 4:00 pm. You do not need an appointment for the interview!  [facility] [address], [place], [place]   [place]  [place] Department  [place]  [phone]    Behavioral Health Resources in the Community: Intensive Outpatient Programs Organization         Address  Phone  Notes  [place]. [address] [place], [place] [phone]   [facility]patient [address], [place], [place]   ADS: Alcohol & Drug Svcs [address], [place], [place]   [place] [address]  [place], [place] or [phone]   Substance Abuse Resources Organization         Address  Phone  Notes  Alcohol and Drug Services  [phone]   [place]  [phone]   The [place]   Chinita Pester  [phone]   Residential & Outpatient Substance Abuse Program  [phone]   Psychological Services Organization         Address  Phone  Notes  [facility]  [place]  [phone]   [place] [address], [place] or [phone]    Mobile Crisis Teams Organization         Address  Phone  Notes  [facility], Mobile Crisis Care Unit  [phone]   [facility] Psych[facility]  [address] [place], [place]   [place] Levels [address] [place] [phone]    Self-Help/Support Groups Organization         Address  Phone  Notes  [facility]oc[facility] of [place] - variety of support groups  [place] Call for more information  Narcotics Anonymous (NA), [facility] [address], [employer] [place]  2 meetings at this location   Special educational needs teacher         Address  Phone  Notes  ASAP Residential Treatment [place],    [place]  [phone]   [facility]  [address], [place] [phone], [place], [place]   [place], [place] [phone] Admissions: 8am-3pm M-F  Incentives Substance [place] [address],    [place], [place] [phone]   [facility] [address] [place], [place], [place]   [facility] [address],  [place], [place]   [facility] Programs - Intensive Outpatient [address] Kristeen [address], [place], [place]   [facility] ([place] [address].,  [place], [place] [phone] or [phone]   [facility] Services (RTS) [address] [place], [place] Accepts Medicaid  [place] [address]  [place] [phone] Substance Abuse/Addiction Treatment   [facility] Organization         Address  Phone  Notes  [facility]  [phone]   Domenic Schwab, PhD [address] Arlis Porta [place], [place]   [phone] or [phone]   [place], [place] [phone]   [facility]  [address] [place], [place] [phone] Insurance/Medicaid/sponsorship through [facility] and Families [address], [address] [place]                                    [place], [place] [phone] [place] [address], [place] [phone]    Dr. Adele Schilder  [phone]   [facility] Dept. 1) [address], [place] 2) [place] 3)  [place] [address], [place] [phone]  [phone]   [place] [phone] or [phone] (After Hours)

## 2013-08-19 NOTE — ED Provider Notes (Signed)
CSN: [id number]     Arrival date & time 08/19/13  1222 History   First MD Initiated Contact with Patient 08/19/13 1324     Chief Complaint  Patient presents with  . Migraine   (Consider location/radiation/quality/duration/timing/severity/associated sxs/prior Treatment) HPI PT reports hx of migraines/headache since 38 yo. States she gets them 3-4 times a week and usually take ibuprofen/acetaminophen and lays down. She comes to the ED intermittently when she is unable to control her headaches at home.  States this headache started about 2-3 days ago and is in the usual right forehead/right side of head radiating into her right neck and into her right shoulder which is different but she is also having some right rotator cuff problems. States her neck feels stiff. She denies fever, she does has chronic blurred vision (needs new glasses), has nausea without vomiting, but denies numbness or weakness in her extremities. States she brought her son to the ED to be seen so she decided to be seen also.   PCP none (between doctors)  Past Medical History  Diagnosis Date  . Diabetes mellitus   . Depression   . Anxiety   . Hypertension   . Migraine   . High cholesterol   . Plantar fasciitis   . Neuropathic pain of foot   . Chronic back pain   . Sciatica    Past Surgical History  Procedure Laterality Date  . Back surgery    . Eye surgery    . Tubal ligation     Family History  Problem Relation Age of Onset  . Heart failure Mother   . COPD Mother   . Diabetes Mother   . Heart failure Father   . Diabetes Other    History  Substance Use Topics  . Smoking status: Current Every Day Smoker -- 1.00 packs/day    Types: Cigarettes  . Smokeless tobacco: Not on file  . Alcohol Use: No     Comment: occ   Works in a group home   OB History   Grav Para Term Preterm Abortions TAB SAB Ect Mult Living                 Review of Systems  All other systems reviewed and are  negative.    Allergies  Codeine; Benadryl; and Compazine  Home Medications   Current Outpatient Rx  Name  Route  Sig  Dispense  Refill  . acetaminophen (TYLENOL) 500 MG tablet   Oral   Take 2,000 mg by mouth 4 (four) times daily as needed for headache. For pain         . ibuprofen (ADVIL,MOTRIN) 200 MG tablet   Oral   Take 1,000 mg by mouth 3 (three) times daily as needed for headache.           BP 150/87  Pulse 94  Temp(Src) 98.2 F (36.8 C) (Oral)  Resp 16  Ht 5\' 10"  (1.778 m)  Wt 260 lb (117.935 kg)  BMI 37.31 kg/m2  SpO2 99%  LMP 07/06/2013  Vital signs normal   Physical Exam  Nursing note and vitals reviewed. Constitutional: She is oriented to person, place, and time. She appears well-developed and well-nourished.  Non-toxic appearance. She does not appear ill. No distress.  Appears uncomfortable in a dark room  HENT:  Head: Normocephalic and atraumatic.  Right Ear: External ear normal.  Left Ear: External ear normal.  Nose: Nose normal. No mucosal edema or rhinorrhea.  Mouth/Throat: Oropharynx is clear  and moist and mucous membranes are normal. No dental abscesses or uvula swelling.  Eyes: Conjunctivae and EOM are normal. Pupils are equal, round, and reactive to light.  Neck: Normal range of motion and full passive range of motion without pain. Neck supple.    Cardiovascular: Normal rate, regular rhythm and normal heart sounds.  Exam reveals no gallop and no friction rub.   No murmur heard. Pulmonary/Chest: Effort normal and breath sounds normal. No respiratory distress. She has no wheezes. She has no rhonchi. She has no rales. She exhibits no tenderness and no crepitus.  Abdominal: Soft. Normal appearance and bowel sounds are normal. She exhibits no distension. There is no tenderness. There is no rebound and no guarding.  Musculoskeletal: Normal range of motion. She exhibits no edema and no tenderness.  Moves all extremities well.   Neurological: She is  alert and oriented to person, place, and time. She has normal strength. No cranial nerve deficit.  Skin: Skin is warm, dry and intact. No rash noted. No erythema. No pallor.  Psychiatric: She has a normal mood and affect. Her speech is normal and behavior is normal. Her mood appears not anxious.    ED Course  Procedures (including critical care time)  Medications  0.9 %  sodium chloride infusion (1,000 mLs Intravenous New Bag/Given 08/19/13 1436)    Followed by  0.9 %  sodium chloride infusion (1,000 mLs Intravenous New Bag/Given 08/19/13 1437)  metoCLOPramide (REGLAN) injection 10 mg (10 mg Intravenous Given 08/19/13 1438)  ketorolac (TORADOL) 30 MG/ML injection 30 mg (30 mg Intravenous Given 08/19/13 1438)  LORazepam (ATIVAN) injection 1 mg (1 mg Intravenous Given 08/19/13 1437)    Recheck 15:20 starting to feel better, IV bolus still running.   16:25 sitting on the side of her stretcher, states she feels ready to go home.   Labs Review Labs Reviewed - No data to display Imaging Review No results found.  EKG Interpretation   None       MDM   1. Headache     Plan discharge   Devoria AlbeIva Marchelle Rinella, MD, [place] DellFACEP    Makaiyah Schweiger L Adonnis Salceda, MD 08/19/13 [phone]

## 2013-09-25 ENCOUNTER — Encounter: Payer: Self-pay | Admitting: Orthopedic Surgery

## 2013-09-25 ENCOUNTER — Ambulatory Visit (INDEPENDENT_AMBULATORY_CARE_PROVIDER_SITE_OTHER): Payer: Self-pay

## 2013-09-25 ENCOUNTER — Ambulatory Visit (INDEPENDENT_AMBULATORY_CARE_PROVIDER_SITE_OTHER): Payer: Self-pay | Admitting: Orthopedic Surgery

## 2013-09-25 VITALS — BP 135/78 | Ht 69.0 in | Wt 243.0 lb

## 2013-09-25 DIAGNOSIS — M25519 Pain in unspecified shoulder: Secondary | ICD-10-CM

## 2013-09-25 DIAGNOSIS — M67919 Unspecified disorder of synovium and tendon, unspecified shoulder: Secondary | ICD-10-CM

## 2013-09-25 DIAGNOSIS — M25511 Pain in right shoulder: Secondary | ICD-10-CM

## 2013-09-25 DIAGNOSIS — M7150 Other bursitis, not elsewhere classified, unspecified site: Secondary | ICD-10-CM

## 2013-09-25 DIAGNOSIS — M719 Bursopathy, unspecified: Secondary | ICD-10-CM

## 2013-09-25 MED ORDER — DICLOFENAC SODIUM 75 MG PO TBEC: 75.0000 mg | DELAYED_RELEASE_TABLET | Freq: Two times a day (BID) | ORAL | Status: DC

## 2013-09-25 MED ORDER — HYDROCODONE-ACETAMINOPHEN 5-325 MG PO TABS: 1.0000 | ORAL_TABLET | Freq: Four times a day (QID) | ORAL | Status: DC | PRN

## 2013-09-25 NOTE — Progress Notes (Signed)
Patient ID: Tina BattiestLaura W Pham, female   DOB: 12/03/1975, 38 y.o.   MRN: [id number]  This is a 38 year old female who presents with right shoulder pain. The symptoms began several months ago. Aggravating factors: repetitive activity,  and overhead activity. Pain is located between the neck and shoulder, around the acromioclavicular [facility](AC) joint and diffusely throughout the shoulder. Discomfort is described as sharp/stabbing and throbbing. Symptoms are exacerbated by repetitive movements, overhead movements and lying on the shoulder. Evaluation to date: plain films: normal. Therapy to date includes: rest, avoidance of offending activity, prescription NSAIDS which are not very effective, opioids which are somewhat effective and 6 mg decadron.  The following portions of the patient's history were reviewed and updated as appropriate: allergies, current medications, past family history, past medical history, past social history, past surgical history and problem list.  Review of Systems Pertinent items are noted in HPI.   Objective:    BP 135/78  Ht 5\' 9"  (1.753 m)  Wt 243 lb (110.224 kg)  BMI 35.87 kg/m2  LMP 09/23/2013 General appearance is normal, the patient is alert and oriented x3 with normal mood and affect. Reflexes are intact bilaterally 2+ Sensation is normal.  Cervical spine exam shows no tenderness over the midline of the cervical spine but mild tenderness on the left trapezius muscle and severe tenderness in the right trapezius muscle and parascapular musculature.  Right shoulder: normal active ROM, no tenderness, no impingement sign and Stability normal motor exam normal skin normal  Left shoulder: tenderness over the glenohumeral joint, distal neuromuscular exam negative, positive for tenderness about the glenohumeral joint, negative for tenderness over the acromioclavicular joint, positive for impingement sign, sensory exam normal, motor exam normal and radial pulse intact no  instability range of motion is decreased in terms of forward elevation but normal internal rotation slight decreased external rotation compared to the opposite side. Inferior stability tests normal      Assessment:    Right rotator cuff tendinitis    Plan:    Our recommendation is for continued anti-inflammatories a brief course of oral pain medication and a subacromial injection with intervention by occupational therapy. No surgical intervention needed at this time   Shoulder Injection Procedure Note   Pre-operative Diagnosis: right  RC Syndrome  Post-operative Diagnosis: same  Indications: pain   Anesthesia: ethyl chloride   Procedure Details   Verbal consent was obtained for the procedure. The shoulder was prepped withalcohol and the skin was anesthetized. A 20 gauge needle was advanced into the subacromial space through posterior approach without difficulty  The space was then injected with 3 ml 1% lidocaine and 1 ml of depomedrol. The injection site was cleansed with isopropyl alcohol and a dressing was applied.  Complications:  None; patient tolerated the procedure well.

## 2013-09-25 NOTE — Patient Instructions (Signed)
  Call to arrange therapy at [facility]    Joint Injection  Care After  Refer to this sheet in the next few days. These instructions provide you with information on caring for yourself after you have had a joint injection. Your caregiver also may give you more specific instructions. Your treatment has been planned according to current medical practices, but problems sometimes occur. Call your caregiver if you have any problems or questions after your procedure.  After any type of joint injection, it is not uncommon to experience:  Soreness, swelling, or bruising around the injection site.  Mild numbness, tingling, or weakness around the injection site caused by the numbing medicine used before or with the injection. It also is possible to experience the following effects associated with the specific agent after injection:  Iodine-based contrast agents:  Allergic reaction (itching, hives, widespread redness, and swelling beyond the injection site).  Corticosteroids (These effects are rare.):  Allergic reaction.  Increased blood sugar levels (If you have diabetes and you notice that your blood sugar levels have increased, notify your caregiver).  Increased blood pressure levels.  Mood swings.  Hyaluronic acid in the use of viscosupplementation.  Temporary heat or redness.  Temporary rash and itching.  Increased fluid accumulation in the injected joint. These effects all should resolve within a day after your procedure.  HOME CARE INSTRUCTIONS  Limit yourself to light activity the day of your procedure. Avoid lifting heavy objects, bending, stooping, or twisting.  Take prescription or over-the-counter pain medication as directed by your caregiver.  You may apply ice to your injection site to reduce pain and swelling the day of your procedure. Ice may be applied 3-4 times:  Put ice in a plastic bag.  Place a towel between your skin and the bag.  Leave the ice on for no longer than 15-20  minutes each time. SEEK IMMEDIATE MEDICAL CARE IF:  Pain and swelling get worse rather than better or extend beyond the injection site.  Numbness does not go away.  Blood or fluid continues to leak from the injection site.  You have chest pain.  You have swelling of your face or tongue.  You have trouble breathing or you become dizzy.  You develop a fever, chills, or severe tenderness at the injection site that last longer than 1 day. MAKE SURE YOU:  Understand these instructions.  Watch your condition.  Get help right away if you are not doing well or if you get worse. Document Released: 03/23/2011 Document Revised: 10/02/2011 Document Reviewed: 03/23/2011  ExitCare Patient Information 2014 [employer].   

## 2013-09-30 ENCOUNTER — Inpatient Hospital Stay (HOSPITAL_COMMUNITY): Admission: RE | Admit: 2013-09-30 | Payer: Self-pay | Source: Ambulatory Visit

## 2013-12-01 ENCOUNTER — Encounter (HOSPITAL_COMMUNITY): Payer: Self-pay | Admitting: Dietician

## 2013-12-01 NOTE — Progress Notes (Signed)
Pt was a no-show for appointment scheduled for 12/01/2013 at 1530.

## 2013-12-04 ENCOUNTER — Emergency Department (HOSPITAL_COMMUNITY)
Admission: EM | Admit: 2013-12-04 | Discharge: 2013-12-04 | Disposition: A | Discharge status: Home / Self Care | Payer: BC Managed Care – PPO | Attending: Emergency Medicine | Admitting: Emergency Medicine

## 2013-12-04 ENCOUNTER — Encounter (HOSPITAL_COMMUNITY): Payer: Self-pay | Admitting: Emergency Medicine

## 2013-12-04 ENCOUNTER — Emergency Department (HOSPITAL_COMMUNITY): Payer: BC Managed Care – PPO

## 2013-12-04 DIAGNOSIS — Z791 Long term (current) use of non-steroidal anti-inflammatories (NSAID): Secondary | ICD-10-CM | POA: Insufficient documentation

## 2013-12-04 DIAGNOSIS — R509 Fever, unspecified: Secondary | ICD-10-CM | POA: Insufficient documentation

## 2013-12-04 DIAGNOSIS — G8929 Other chronic pain: Secondary | ICD-10-CM | POA: Insufficient documentation

## 2013-12-04 DIAGNOSIS — F17200 Nicotine dependence, unspecified, uncomplicated: Secondary | ICD-10-CM | POA: Insufficient documentation

## 2013-12-04 DIAGNOSIS — E119 Type 2 diabetes mellitus without complications: Secondary | ICD-10-CM | POA: Insufficient documentation

## 2013-12-04 DIAGNOSIS — Z8659 Personal history of other mental and behavioral disorders: Secondary | ICD-10-CM | POA: Insufficient documentation

## 2013-12-04 DIAGNOSIS — N390 Urinary tract infection, site not specified: Secondary | ICD-10-CM | POA: Insufficient documentation

## 2013-12-04 DIAGNOSIS — Z9851 Tubal ligation status: Secondary | ICD-10-CM | POA: Insufficient documentation

## 2013-12-04 DIAGNOSIS — Z79899 Other long term (current) drug therapy: Secondary | ICD-10-CM | POA: Insufficient documentation

## 2013-12-04 DIAGNOSIS — R112 Nausea with vomiting, unspecified: Secondary | ICD-10-CM | POA: Insufficient documentation

## 2013-12-04 DIAGNOSIS — Z8669 Personal history of other diseases of the nervous system and sense organs: Secondary | ICD-10-CM | POA: Insufficient documentation

## 2013-12-04 DIAGNOSIS — R109 Unspecified abdominal pain: Secondary | ICD-10-CM

## 2013-12-04 DIAGNOSIS — I10 Essential (primary) hypertension: Secondary | ICD-10-CM | POA: Insufficient documentation

## 2013-12-04 DIAGNOSIS — M5430 Sciatica, unspecified side: Secondary | ICD-10-CM | POA: Insufficient documentation

## 2013-12-04 LAB — CBC WITH DIFFERENTIAL/PLATELET
Basophils Absolute: 0.1 10*3/uL (ref 0.0–0.1)
Basophils Relative: 1 % (ref 0–1)
Eosinophils Absolute: 0.2 10*3/uL (ref 0.0–0.7)
Eosinophils Relative: 3 % (ref 0–5)
HCT: 36.4 % (ref 36.0–46.0)
Hemoglobin: 11.7 g/dL — ABNORMAL LOW (ref 12.0–15.0)
Lymphocytes Relative: 34 % (ref 12–46)
Lymphs Abs: 3.1 10*3/uL (ref 0.7–4.0)
MCH: 26.8 pg (ref 26.0–34.0)
MCHC: 32.1 g/dL (ref 30.0–36.0)
MCV: 83.3 fL (ref 78.0–100.0)
Monocytes Absolute: 0.6 10*3/uL (ref 0.1–1.0)
Monocytes Relative: 6 % (ref 3–12)
Neutro Abs: 5.2 10*3/uL (ref 1.7–7.7)
Neutrophils Relative %: 56 % (ref 43–77)
Platelets: 344 10*3/uL (ref 150–400)
RBC: 4.37 MIL/uL (ref 3.87–5.11)
RDW: 14.7 % (ref 11.5–15.5)
WBC: 9.2 10*3/uL (ref 4.0–10.5)

## 2013-12-04 LAB — BASIC METABOLIC PANEL
BUN: 13 mg/dL (ref 6–23)
CO2: 24 mEq/L (ref 19–32)
Calcium: 9.3 mg/dL (ref 8.4–10.5)
Chloride: 100 mEq/L (ref 96–112)
Creatinine, Ser: 0.66 mg/dL (ref 0.50–1.10)
GFR calc Af Amer: >90 mL/min (ref >90)
GFR calc non Af Amer: >90 mL/min (ref >90)
Glucose, Bld: 172 mg/dL — ABNORMAL HIGH (ref 70–99)
Potassium: 3.7 mEq/L (ref 3.7–5.3)
Sodium: 137 mEq/L (ref 137–147)

## 2013-12-04 LAB — URINALYSIS, ROUTINE W REFLEX MICROSCOPIC
Bilirubin Urine: NEGATIVE
Glucose, UA: NEGATIVE mg/dL
Hgb urine dipstick: NEGATIVE
Ketones, ur: NEGATIVE mg/dL
Nitrite: NEGATIVE
Protein, ur: NEGATIVE mg/dL
Specific Gravity, Urine: 1.025 (ref 1.005–1.030)
Urobilinogen, UA: 0.2 mg/dL (ref 0.0–1.0)
pH: 6 (ref 5.0–8.0)

## 2013-12-04 LAB — URINE MICROSCOPIC-ADD ON

## 2013-12-04 LAB — CBG MONITORING, ED: Glucose-Capillary: 165 mg/dL — ABNORMAL HIGH (ref 70–99)

## 2013-12-04 LAB — PREGNANCY, URINE: Preg Test, Ur: NEGATIVE

## 2013-12-04 MED ORDER — DEXTROSE 5 % IV SOLN
1.0000 g | Freq: Once | INTRAVENOUS | Status: AC
  Administered 2013-12-04: 1 g via INTRAVENOUS
  Filled 2013-12-04: qty 10

## 2013-12-04 MED ORDER — HYDROMORPHONE HCL PF 1 MG/ML IJ SOLN
1.0000 mg | Freq: Once | INTRAMUSCULAR | Status: AC
Start: 2013-12-04 — End: 2013-12-04
  Administered 2013-12-04: 1 mg via INTRAVENOUS
  Filled 2013-12-04: qty 1

## 2013-12-04 MED ORDER — ONDANSETRON HCL 4 MG/2ML IJ SOLN
4.0000 mg | Freq: Once | INTRAMUSCULAR | Status: AC
  Administered 2013-12-04: 4 mg via INTRAVENOUS
  Filled 2013-12-04: qty 2

## 2013-12-04 MED ORDER — SODIUM CHLORIDE 0.9 % IV BOLUS (SEPSIS)
1000.0000 mL | Freq: Once | INTRAVENOUS | Status: AC
  Administered 2013-12-04: 1000 mL via INTRAVENOUS

## 2013-12-04 MED ORDER — HYDROCODONE-ACETAMINOPHEN 5-325 MG PO TABS: 1.0000 | ORAL_TABLET | Freq: Four times a day (QID) | ORAL | Status: DC | PRN

## 2013-12-04 MED ORDER — CEPHALEXIN 500 MG PO CAPS: 500.0000 mg | ORAL_CAPSULE | Freq: Four times a day (QID) | ORAL | Status: DC

## 2013-12-04 MED ORDER — SODIUM CHLORIDE 0.9 % IV SOLN: INTRAVENOUS | Status: DC

## 2013-12-04 NOTE — ED Notes (Signed)
Patient c/o right flank pain that radiates from the back to the front.  Denies N/V; states having urinary frequency.

## 2013-12-04 NOTE — Discharge Instructions (Signed)
Workup consistent with urinary tract infection. Take antibiotic as directed. Take pain medicine as needed. Followup with your doctor and if not improved in next few days. Return for any newer worse symptoms. Work note provided.

## 2013-12-04 NOTE — ED Provider Notes (Signed)
CSN: [id number]     Arrival date & time 12/04/13  2016 History   First MD Initiated Contact with Patient 12/04/13 2042     Chief Complaint  Patient presents with  . Flank Pain     (Consider location/radiation/quality/duration/timing/severity/associated sxs/prior Treatment) Patient is a 38 y.o. female presenting with flank pain. The history is provided by the patient.  Flank Pain Associated symptoms include abdominal pain. Pertinent negatives include no chest pain, no headaches and no shortness of breath.   patient with complaint of right CVA right flank pain radiating in the right groin. Symptoms started today. Associated with nausea vomiting fever does have urinary frequency but no dysuria or hematuria. Patient with past history of urinary tract infections. Patient also with a questionable history of past kidney stones. Pain does not seem to be muscular or skeletal in nature. Current pain is 8/10. Described as sharp radiating as described.  Past Medical History  Diagnosis Date  . Diabetes mellitus   . Depression   . Anxiety   . Hypertension   . Migraine   . High cholesterol   . Plantar fasciitis   . Neuropathic pain of foot   . Chronic back pain   . Sciatica    Past Surgical History  Procedure Laterality Date  . Back surgery    . Eye surgery    . Tubal ligation     Family History  Problem Relation Age of Onset  . Heart failure Mother   . COPD Mother   . Diabetes Mother   . Heart failure Father   . Diabetes Other    History  Substance Use Topics  . Smoking status: Current Every Day Smoker -- 1.00 packs/day    Types: Cigarettes  . Smokeless tobacco: Not on file  . Alcohol Use: No     Comment: occ   OB History   Grav Para Term Preterm Abortions TAB SAB Ect Mult Living                 Review of Systems  Constitutional: Negative for fever.  HENT: Negative for congestion.   Eyes: Negative for redness.  Respiratory: Negative for shortness of breath.    Cardiovascular: Negative for chest pain.  Gastrointestinal: Positive for abdominal pain.  Genitourinary: Positive for frequency and flank pain. Negative for dysuria and hematuria.  Musculoskeletal: Positive for back pain.  Skin: Negative for rash.  Neurological: Negative for headaches.  Hematological: Does not bruise/bleed easily.  Psychiatric/Behavioral: Negative for confusion.      Allergies  Codeine; Benadryl; and Compazine  Home Medications   Prior to Admission medications   Medication Sig Start Date End Date Taking? Authorizing Provider  acetaminophen (TYLENOL) 500 MG tablet Take 2,000 mg by mouth 4 (four) times daily as needed for headache. For pain    Historical Provider, MD  diclofenac (VOLTAREN) 75 MG EC tablet Take 1 tablet (75 mg total) by mouth 2 (two) times daily with a meal. 09/25/13   Vickki Hearing, MD  HYDROcodone-acetaminophen (NORCO/VICODIN) 5-325 MG per tablet Take 1 tablet by mouth every 6 (six) hours as needed for moderate pain. 09/25/13   Vickki Hearing, MD  ibuprofen (ADVIL,MOTRIN) 200 MG tablet Take 1,000 mg by mouth 3 (three) times daily as needed for headache.     Historical Provider, MD   BP 136/94  Pulse 101  Temp(Src) 98.2 F (36.8 C) (Oral)  Resp 20  Ht 5\' 9"  (1.753 m)  Wt 270 lb (122.471 kg)  BMI  39.85 kg/m2  SpO2 98%  LMP 11/20/2013 Physical Exam  Nursing note and vitals reviewed. Constitutional: She is oriented to person, place, and time. She appears well-developed. No distress.  HENT:  Head: Normocephalic and atraumatic.  Mouth/Throat: Oropharynx is clear and moist.  Eyes: Conjunctivae and EOM are normal. Pupils are equal, round, and reactive to light.  Neck: Normal range of motion.  Cardiovascular: Normal rate, regular rhythm and normal heart sounds.   Pulmonary/Chest: Effort normal and breath sounds normal. No respiratory distress.  Abdominal: Soft. Bowel sounds are normal. She exhibits no distension. There is no tenderness.   Musculoskeletal: Normal range of motion.  Neurological: She is oriented to person, place, and time. No cranial nerve deficit. She exhibits normal muscle tone. Coordination normal.  Skin: Skin is warm. No rash noted. No erythema.    ED Course  Procedures (including critical care time) Labs Review Labs Reviewed  URINALYSIS, ROUTINE W REFLEX MICROSCOPIC - Abnormal; Notable for the following:    Leukocytes, UA SMALL (*)    All other components within normal limits  CBC WITH DIFFERENTIAL - Abnormal; Notable for the following:    Hemoglobin 11.7 (*)    All other components within normal limits  BASIC METABOLIC PANEL - Abnormal; Notable for the following:    Glucose, Bld 172 (*)    All other components within normal limits  URINE MICROSCOPIC-ADD ON - Abnormal; Notable for the following:    Squamous Epithelial / LPF MANY (*)    Bacteria, UA FEW (*)    All other components within normal limits  CBG MONITORING, ED - Abnormal; Notable for the following:    Glucose-Capillary 165 (*)    All other components within normal limits  PREGNANCY, URINE   Results for orders placed during the hospital encounter of 12/04/13  URINALYSIS, ROUTINE W REFLEX MICROSCOPIC      Result Value Ref Range   Color, Urine YELLOW  YELLOW   APPearance CLEAR  CLEAR   Specific Gravity, Urine 1.025  1.005 - 1.030   pH 6.0  5.0 - 8.0   Glucose, UA NEGATIVE  NEGATIVE mg/dL   Hgb urine dipstick NEGATIVE  NEGATIVE   Bilirubin Urine NEGATIVE  NEGATIVE   Ketones, ur NEGATIVE  NEGATIVE mg/dL   Protein, ur NEGATIVE  NEGATIVE mg/dL   Urobilinogen, UA 0.2  0.0 - 1.0 mg/dL   Nitrite NEGATIVE  NEGATIVE   Leukocytes, UA SMALL (*) NEGATIVE  PREGNANCY, URINE      Result Value Ref Range   Preg Test, Ur NEGATIVE  NEGATIVE  CBC WITH DIFFERENTIAL      Result Value Ref Range   WBC 9.2  4.0 - 10.5 K/uL   RBC 4.37  3.87 - 5.11 MIL/uL   Hemoglobin 11.7 (*) 12.0 - 15.0 g/dL   HCT 62.136.4  30.836.0 - 65.746.0 %   MCV 83.3  78.0 - 100.0 fL    MCH 26.8  26.0 - 34.0 pg   MCHC 32.1  30.0 - 36.0 g/dL   RDW 84.614.7  96.211.5 - 95.215.5 %   Platelets 344  150 - 400 K/uL   Neutrophils Relative % 56  43 - 77 %   Neutro Abs 5.2  1.7 - 7.7 K/uL   Lymphocytes Relative 34  12 - 46 %   Lymphs Abs 3.1  0.7 - 4.0 K/uL   Monocytes Relative 6  3 - 12 %   Monocytes Absolute 0.6  0.1 - 1.0 K/uL   Eosinophils Relative 3  0 -  5 %   Eosinophils Absolute 0.2  0.0 - 0.7 K/uL   Basophils Relative 1  0 - 1 %   Basophils Absolute 0.1  0.0 - 0.1 K/uL  BASIC METABOLIC PANEL      Result Value Ref Range   Sodium 137  137 - 147 mEq/L   Potassium 3.7  3.7 - 5.3 mEq/L   Chloride 100  96 - 112 mEq/L   CO2 24  19 - 32 mEq/L   Glucose, Bld 172 (*) 70 - 99 mg/dL   BUN 13  6 - 23 mg/dL   Creatinine, Ser 1.610.66  0.50 - 1.10 mg/dL   Calcium 9.3  8.4 - 09.610.5 mg/dL   GFR calc non Af Amer >90  >90 mL/min   GFR calc Af Amer >90  >90 mL/min  URINE MICROSCOPIC-ADD ON      Result Value Ref Range   Squamous Epithelial / LPF MANY (*) RARE   WBC, UA 11-20  <3 WBC/hpf   Bacteria, UA FEW (*) RARE  CBG MONITORING, ED      Result Value Ref Range   Glucose-Capillary 165 (*) 70 - 99 mg/dL   Comment 1 Documented in Chart     Comment 2 Notify RN       Imaging Review No results found.   EKG Interpretation None      MDM   Final diagnoses:  Flank pain  UTI (lower urinary tract infection)    CT scan pending. However patient's workup does seem to be consistent with at least a urinary tract infection. Treated here with 1 g of Rocephin and will continue Keflex at home. Pregnancy test is negative. No significant leukocytosis no significant anemia. Renal function is normal. CT scan will rule out the presence of ureteral calculus. If negative patient will be treated with pain medication and antibiotic as stated.    Shelda JakesScott W. Amarya Kuehl, MD 12/04/13 [phone]

## 2014-02-21 ENCOUNTER — Emergency Department (HOSPITAL_COMMUNITY): Payer: BC Managed Care – PPO

## 2014-02-21 ENCOUNTER — Encounter (HOSPITAL_COMMUNITY): Payer: Self-pay | Admitting: Emergency Medicine

## 2014-02-21 ENCOUNTER — Emergency Department (HOSPITAL_COMMUNITY)
Admission: EM | Admit: 2014-02-21 | Discharge: 2014-02-21 | Disposition: A | Discharge status: Home / Self Care | Payer: BC Managed Care – PPO | Attending: Emergency Medicine | Admitting: Emergency Medicine

## 2014-02-21 DIAGNOSIS — S39012A Strain of muscle, fascia and tendon of lower back, initial encounter: Secondary | ICD-10-CM

## 2014-02-21 DIAGNOSIS — Y9289 Other specified places as the place of occurrence of the external cause: Secondary | ICD-10-CM | POA: Insufficient documentation

## 2014-02-21 DIAGNOSIS — Z8669 Personal history of other diseases of the nervous system and sense organs: Secondary | ICD-10-CM | POA: Insufficient documentation

## 2014-02-21 DIAGNOSIS — Y9389 Activity, other specified: Secondary | ICD-10-CM | POA: Insufficient documentation

## 2014-02-21 DIAGNOSIS — Z8659 Personal history of other mental and behavioral disorders: Secondary | ICD-10-CM | POA: Insufficient documentation

## 2014-02-21 DIAGNOSIS — E119 Type 2 diabetes mellitus without complications: Secondary | ICD-10-CM | POA: Insufficient documentation

## 2014-02-21 DIAGNOSIS — G8929 Other chronic pain: Secondary | ICD-10-CM | POA: Insufficient documentation

## 2014-02-21 DIAGNOSIS — S29012A Strain of muscle and tendon of back wall of thorax, initial encounter: Secondary | ICD-10-CM

## 2014-02-21 DIAGNOSIS — IMO0002 Reserved for concepts with insufficient information to code with codable children: Secondary | ICD-10-CM | POA: Insufficient documentation

## 2014-02-21 DIAGNOSIS — Z3202 Encounter for pregnancy test, result negative: Secondary | ICD-10-CM | POA: Insufficient documentation

## 2014-02-21 DIAGNOSIS — S233XXA Sprain of ligaments of thoracic spine, initial encounter: Secondary | ICD-10-CM

## 2014-02-21 DIAGNOSIS — S335XXA Sprain of ligaments of lumbar spine, initial encounter: Secondary | ICD-10-CM | POA: Insufficient documentation

## 2014-02-21 DIAGNOSIS — S239XXA Sprain of unspecified parts of thorax, initial encounter: Secondary | ICD-10-CM | POA: Insufficient documentation

## 2014-02-21 DIAGNOSIS — I10 Essential (primary) hypertension: Secondary | ICD-10-CM | POA: Insufficient documentation

## 2014-02-21 DIAGNOSIS — F17200 Nicotine dependence, unspecified, uncomplicated: Secondary | ICD-10-CM | POA: Insufficient documentation

## 2014-02-21 DIAGNOSIS — W010XXA Fall on same level from slipping, tripping and stumbling without subsequent striking against object, initial encounter: Secondary | ICD-10-CM | POA: Insufficient documentation

## 2014-02-21 LAB — POC URINE PREG, ED: Preg Test, Ur: NEGATIVE

## 2014-02-21 MED ORDER — ONDANSETRON 8 MG PO TBDP
8.0000 mg | ORAL_TABLET | Freq: Once | ORAL | Status: AC
  Administered 2014-02-21: 8 mg via ORAL
  Filled 2014-02-21: qty 1

## 2014-02-21 MED ORDER — IBUPROFEN 600 MG PO TABS: 600.0000 mg | ORAL_TABLET | Freq: Four times a day (QID) | ORAL | Status: DC | PRN

## 2014-02-21 MED ORDER — HYDROCODONE-ACETAMINOPHEN 5-325 MG PO TABS: 1.0000 | ORAL_TABLET | ORAL | Status: DC | PRN

## 2014-02-21 MED ORDER — HYDROMORPHONE HCL PF 1 MG/ML IJ SOLN
1.0000 mg | Freq: Once | INTRAMUSCULAR | Status: AC
  Administered 2014-02-21: 1 mg via INTRAMUSCULAR
  Filled 2014-02-21: qty 1

## 2014-02-21 NOTE — ED Notes (Signed)
Pt verbalized understanding of no driving within 4 hours of taking pain med due to med causes drowsiness  

## 2014-02-21 NOTE — ED Notes (Signed)
Pt reporting pain in mid and lower back that began today after stumbling when stepping off a curb.  Pt denies fall.

## 2014-02-21 NOTE — Discharge Instructions (Signed)
Back Pain, Adult °Low back pain is very common. About 1 in 5 people have back pain. The cause of low back pain is rarely dangerous. The pain often gets better over time. About half of people with a sudden onset of back pain feel better in just 2 weeks. About 8 in 10 people feel better by 6 weeks.  °CAUSES °Some common causes of back pain include: °· Strain of the muscles or ligaments supporting the spine. °· Wear and tear (degeneration) of the spinal discs. °· Arthritis. °· Direct injury to the back. °DIAGNOSIS °Most of the time, the direct cause of low back pain is not known. However, back pain can be treated effectively even when the exact cause of the pain is unknown. Answering your caregiver's questions about your overall health and symptoms is one of the most accurate ways to make sure the cause of your pain is not dangerous. If your caregiver needs more information, he or she may order lab work or imaging tests (X-rays or MRIs). However, even if imaging tests show changes in your back, this usually does not require surgery. °HOME CARE INSTRUCTIONS °For many people, back pain returns. Since low back pain is rarely dangerous, it is often a condition that people can learn to manage on their own.  °· Remain active. It is stressful on the back to sit or stand in one place. Do not sit, drive, or stand in one place for more than 30 minutes at a time. Take short walks on level surfaces as soon as pain allows. Try to increase the length of time you walk each day. °· Do not stay in bed. Resting more than 1 or 2 days can delay your recovery. °· Do not avoid exercise or work. Your body is made to move. It is not dangerous to be active, even though your back may hurt. Your back will likely heal faster if you return to being active before your pain is gone. °· Pay attention to your body when you  bend and lift. Many people have less discomfort when lifting if they bend their knees, keep the load close to their bodies, and  avoid twisting. Often, the most comfortable positions are those that put less stress on your recovering back. °· Find a comfortable position to sleep. Use a firm mattress and lie on your side with your knees slightly bent. If you lie on your back, put a pillow under your knees. °· Only take over-the-counter or prescription medicines as directed by your caregiver. Over-the-counter medicines to reduce pain and inflammation are often the most helpful. Your caregiver may prescribe muscle relaxant drugs. These medicines help dull your pain so you can more quickly return to your normal activities and healthy exercise. °· Put ice on the injured area. °¨ Put ice in a plastic bag. °¨ Place a towel between your skin and the bag. °¨ Leave the ice on for 15-20 minutes, 03-04 times a day for the first 2 to 3 days. After that, ice and heat may be alternated to reduce pain and spasms. °· Ask your caregiver about trying back exercises and gentle massage. This may be of some benefit. °· Avoid feeling anxious or stressed. Stress increases muscle tension and can worsen back pain. It is important to recognize when you are anxious or stressed and learn ways to manage it. Exercise is a great option. °SEEK MEDICAL CARE IF: °· You have pain that is not relieved with rest or medicine. °· You have pain that does not improve in 1 week. °· You have new symptoms. °· You are generally not feeling well. °SEEK   IMMEDIATE MEDICAL CARE IF:  °· You have pain that radiates from your back into your legs. °· You develop new bowel or bladder control problems. °· You have unusual weakness or numbness in your arms or legs. °· You develop nausea or vomiting. °· You develop abdominal pain. °· You feel faint. °Document Released: 07/10/2005 Document Revised: 01/09/2012 Document Reviewed: 11/11/2013 °[employer]® Patient Information ©2015 [employer]. This information is not intended to replace advice given to you by your health care provider. Make sure you  discuss any questions you have with your health care provider. ° ° °  Do not drive within 4 hours of taking hydrocodone as this will make you drowsy.  Avoid lifting,  Bending,  Twisting or any other activity that worsens your pain over the next week.  Apply an  icepack  to your lower back for 10-15 minutes every 2 hours for the next 2 days.  You should get rechecked if your symptoms are not better over the next 5 days,  Or you develop increased pain,  Weakness in your leg(s) or loss of bladder or bowel function - these are symptoms of a worse injury. ° ° °

## 2014-02-22 NOTE — ED Provider Notes (Signed)
CSN: [id number]     Arrival date & time 02/21/14  1837 History   First MD Initiated Contact with Patient 02/21/14 1923     Chief Complaint  Patient presents with  . Back Pain     (Consider location/radiation/quality/duration/timing/severity/associated sxs/prior Treatment) HPI  Tina Pham is a 38 y.o. female presenting  with acute on chronic low back pain which has which started today around noon when she stumbled, but did not fall when stepping off a curb. She has pain in both her thoracic and her lower back which is aching and constant. There has been no weakness or numbness in the lower extremities and no urinary or bowel retention or incontinence.  Patient does not have a history of cancer or IVDU.  The patient has tried the followed medicines and/or treatments without relief of pain: ibuprofen.     Past Medical History  Diagnosis Date  . Diabetes mellitus   . Depression   . Anxiety   . Hypertension   . Migraine   . High cholesterol   . Plantar fasciitis   . Neuropathic pain of foot   . Chronic back pain   . Sciatica    Past Surgical History  Procedure Laterality Date  . Back surgery    . Eye surgery    . Tubal ligation     Family History  Problem Relation Age of Onset  . Heart failure Mother   . COPD Mother   . Diabetes Mother   . Heart failure Father   . Diabetes Other    History  Substance Use Topics  . Smoking status: Current Every Day Smoker -- 1.00 packs/day    Types: Cigarettes  . Smokeless tobacco: Not on file  . Alcohol Use: No     Comment: occ   OB History   Grav Para Term Preterm Abortions TAB SAB Ect Mult Living                 Review of Systems  Constitutional: Negative for fever.  Respiratory: Negative for shortness of breath.   Cardiovascular: Negative for chest pain and leg swelling.  Gastrointestinal: Negative for abdominal pain, constipation and abdominal distention.  Genitourinary: Negative for dysuria, urgency, frequency,  flank pain and difficulty urinating.  Musculoskeletal: Positive for back pain. Negative for gait problem and joint swelling.  Skin: Negative for rash.  Neurological: Negative for weakness and numbness.      Allergies  Codeine; Benadryl; and Compazine  Home Medications   Prior to Admission medications   Medication Sig Start Date End Date Taking? Authorizing Provider  acetaminophen (TYLENOL) 500 MG tablet Take 2,000 mg by mouth 4 (four) times daily as needed for headache. For pain   Yes Historical Provider, MD  ibuprofen (ADVIL,MOTRIN) 200 MG tablet Take 1,000 mg by mouth 3 (three) times daily as needed for headache.    Yes Historical Provider, MD  HYDROcodone-acetaminophen (NORCO/VICODIN) 5-325 MG per tablet Take 1 tablet by mouth every 4 (four) hours as needed. 02/21/14   Burgess Amor, PA-C  ibuprofen (ADVIL,MOTRIN) 600 MG tablet Take 1 tablet (600 mg total) by mouth every 6 (six) hours as needed. 02/21/14   Burgess Amor, PA-C   BP 155/98  Pulse 90  Temp(Src) 98.8 F (37.1 C) (Oral)  Resp 20  Ht 5\' 9"  (1.753 m)  Wt 250 lb (113.399 kg)  BMI 36.90 kg/m2  SpO2 100%  LMP 02/17/2014 Physical Exam  Nursing note and vitals reviewed. Constitutional: She appears well-developed and well-nourished.  HENT:  Head: Normocephalic.  Eyes: Conjunctivae are normal.  Neck: Normal range of motion. Neck supple.  Cardiovascular: Normal rate and intact distal pulses.   Pedal pulses normal.  Pulmonary/Chest: Effort normal.  Abdominal: Soft. Bowel sounds are normal. She exhibits no distension and no mass.  Musculoskeletal: Normal range of motion. She exhibits no edema.       Thoracic back: She exhibits bony tenderness. She exhibits no swelling, no edema and no spasm.       Lumbar back: She exhibits bony tenderness. She exhibits no swelling, no edema, no deformity and no spasm.  Neurological: She is alert. She has normal strength. She displays no atrophy and no tremor. No sensory deficit. Gait normal.   Reflex Scores:      Patellar reflexes are 2+ on the right side and 2+ on the left side.      Achilles reflexes are 2+ on the right side and 2+ on the left side. No strength deficit noted in hip and knee flexor and extensor muscle groups.  Ankle flexion and extension intact.  Skin: Skin is warm and dry.  Psychiatric: She has a normal mood and affect.    ED Course  Procedures (including critical care time) Labs Review Labs Reviewed  POC URINE PREG, ED    Imaging Review Dg Thoracic Spine 2 View  02/21/2014   CLINICAL DATA:  Mid to lower back pain after stepping off a curb. Smoker.  EXAM: THORACIC SPINE - 2 VIEW  COMPARISON:  02/22/2012.  FINDINGS: Stable mild scoliosis. No significant change in mild anterior spur formation at multiple levels in the lower thoracic spine. No fractures or subluxations are seen. Mild central peribronchial thickening.  IMPRESSION: 1. No fracture or subluxation. 2. Stable mild degenerative changes. 3. Mild chronic bronchitic changes.   Electronically Signed   By: Gordan PaymentSteve  Reid M.D.   On: 02/21/2014 21:05   Dg Lumbar Spine Complete  02/21/2014   CLINICAL DATA:  Low back pain  EXAM: LUMBAR SPINE - COMPLETE 4+ VIEW  COMPARISON:  None.  FINDINGS: Postsurgical changes are noted at L5-S1 with anterior fixation. Vertebral body height is well maintained. No spondylolysis or spondylolisthesis is noted. No acute bony abnormality is noted.  IMPRESSION: Postsurgical change without acute abnormality.   Electronically Signed   By: Alcide CleverMark  Lukens M.D.   On: 02/21/2014 21:06     EKG Interpretation None      MDM   Final diagnoses:  Lumbar strain, initial encounter  Thoracic sprain and strain, initial encounter    No neuro deficit on exam or by history to suggest emergent or surgical presentation.  Also discussed worsened sx that should prompt immediate re-evaluation including distal weakness, bowel/bladder retention/incontinence.  Prescribed hydrocodone, ibuprofen, rest,  Ice  tx.  Prn f/u.  Referrals given for obtaining pcp.        Burgess AmorJulie Rockford Leinen, PA-C 02/22/14 2012

## 2014-02-23 NOTE — ED Provider Notes (Signed)
History/physical exam/procedure(s) were performed by non-physician practitioner and as supervising physician I was immediately available for consultation/collaboration. I have reviewed all notes and am in agreement with care and plan.   Jamonte Curfman S Errik Mitchelle, MD 02/23/14 0010 

## 2014-04-01 ENCOUNTER — Emergency Department (HOSPITAL_COMMUNITY)
Admission: EM | Admit: 2014-04-01 | Discharge: 2014-04-01 | Disposition: A | Discharge status: Home / Self Care | Payer: BC Managed Care – PPO | Attending: Emergency Medicine | Admitting: Emergency Medicine

## 2014-04-01 ENCOUNTER — Encounter (HOSPITAL_COMMUNITY): Payer: Self-pay | Admitting: Emergency Medicine

## 2014-04-01 DIAGNOSIS — E119 Type 2 diabetes mellitus without complications: Secondary | ICD-10-CM | POA: Insufficient documentation

## 2014-04-01 DIAGNOSIS — M25519 Pain in unspecified shoulder: Secondary | ICD-10-CM | POA: Insufficient documentation

## 2014-04-01 DIAGNOSIS — Z8659 Personal history of other mental and behavioral disorders: Secondary | ICD-10-CM | POA: Insufficient documentation

## 2014-04-01 DIAGNOSIS — I10 Essential (primary) hypertension: Secondary | ICD-10-CM | POA: Insufficient documentation

## 2014-04-01 DIAGNOSIS — M79609 Pain in unspecified limb: Secondary | ICD-10-CM | POA: Insufficient documentation

## 2014-04-01 DIAGNOSIS — M25511 Pain in right shoulder: Secondary | ICD-10-CM

## 2014-04-01 DIAGNOSIS — G8929 Other chronic pain: Secondary | ICD-10-CM | POA: Insufficient documentation

## 2014-04-01 DIAGNOSIS — F17200 Nicotine dependence, unspecified, uncomplicated: Secondary | ICD-10-CM | POA: Insufficient documentation

## 2014-04-01 MED ORDER — ONDANSETRON HCL 4 MG PO TABS
4.0000 mg | ORAL_TABLET | Freq: Once | ORAL | Status: AC
  Administered 2014-04-01: 4 mg via ORAL
  Filled 2014-04-01: qty 1

## 2014-04-01 MED ORDER — TRAMADOL HCL 50 MG PO TABS
100.0000 mg | ORAL_TABLET | Freq: Once | ORAL | Status: AC
  Administered 2014-04-01: 100 mg via ORAL
  Filled 2014-04-01: qty 2

## 2014-04-01 MED ORDER — DICLOFENAC SODIUM 75 MG PO TBEC: 75.0000 mg | DELAYED_RELEASE_TABLET | Freq: Two times a day (BID) | ORAL | Status: DC

## 2014-04-01 MED ORDER — PREDNISONE 50 MG PO TABS: 60.0000 mg | ORAL_TABLET | Freq: Once | ORAL | Status: DC

## 2014-04-01 MED ORDER — HYDROCODONE-ACETAMINOPHEN 5-325 MG PO TABS: 1.0000 | ORAL_TABLET | ORAL | Status: DC | PRN

## 2014-04-01 MED ORDER — KETOROLAC TROMETHAMINE 10 MG PO TABS
10.0000 mg | ORAL_TABLET | Freq: Once | ORAL | Status: AC
  Administered 2014-04-01: 10 mg via ORAL
  Filled 2014-04-01: qty 1

## 2014-04-01 NOTE — ED Provider Notes (Signed)
Medical screening examination/treatment/procedure(s) were performed by non-physician practitioner and as supervising physician I was immediately available for consultation/collaboration.   EKG Interpretation None         Joya Gaskins, MD 04/01/14 2016

## 2014-04-01 NOTE — ED Notes (Addendum)
Pt reports was diagnosed with bursitis of right shoulder in March 2015. Pt reports chronic pain that has increased since yesterday. Pt denies any new or recent injury. Pt reports pain is now in right breast. nad noted.  Pt reports increased pain with movement.

## 2014-04-01 NOTE — Discharge Instructions (Signed)
Please see Dr Romeo Apple in the office as soon as possible concerning your shoulder. Use arm sling until seen by Dr Romeo Apple. Use medications as suggested. Norco may cause drowsiness, use with caution. Please call the [facility] and ask for MD's accepting new patients. Shoulder Pain The shoulder is the joint that connects your arm to your body. Muscles and band-like tissues that connect bones to muscles (tendons) hold the joint together. Shoulder pain is felt if an injury or medical problem affects one or more parts of the shoulder. HOME CARE   Put ice on the sore area.  Put ice in a plastic bag.  Place a towel between your skin and the bag.  Leave the ice on for 15-20 minutes, 03-04 times a day for the first 2 days.  Stop using cold packs if they do not help with the pain.  If you were given something to keep your shoulder from moving (sling; shoulder immobilizer), wear it as told. Only take it off to shower or bathe.  Move your arm as little as possible, but keep your hand moving to prevent puffiness (swelling).  Squeeze a soft ball or foam pad as much as possible to help prevent swelling.  Take medicine as told by your doctor. GET HELP IF:  You have progressing new pain in your arm, hand, or fingers.  Your hand or fingers get cold.  Your medicine does not help lessen your pain. GET HELP RIGHT AWAY IF:   Your arm, hand, or fingers are numb or tingling.  Your arm, hand, or fingers are puffy (swollen), painful, or turn white or blue. MAKE SURE YOU:   Understand these instructions.  Will watch your condition.  Will get help right away if you are not doing well or get worse. Document Released: 12/27/2007 Document Revised: 11/24/2013 Document Reviewed: 01/22/2012 [facility] Patient Information 2015 [place], [place]. This information is not intended to replace advice given to you by your health care provider. Make sure you discuss any questions you have with your health care  provider.

## 2014-04-01 NOTE — ED Provider Notes (Signed)
CSN: [id number]     Arrival date & time 04/01/14  1709 History   First MD Initiated Contact with Patient 04/01/14 1737     Chief Complaint  Patient presents with  . Arm Pain     (Consider location/radiation/quality/duration/timing/severity/associated sxs/prior Treatment) HPI Comments: Hx of bursitis of the right shoulder in the past. Pt has pain 5 out of 7 days each week. She noted increased pain of the right shoulder during the past 2 days. Pt denies repetitive movement. No hx of trauma.  Patient is a 38 y.o. female presenting with arm pain. The history is provided by the patient.  Arm Pain This is a chronic problem. The problem occurs intermittently. The problem has been gradually worsening. Associated symptoms include arthralgias. Pertinent negatives include no fever, numbness or rash. Exacerbated by: movement. She has tried nothing for the symptoms. The treatment provided no relief.    Past Medical History  Diagnosis Date  . Diabetes mellitus   . Depression   . Anxiety   . Hypertension   . Migraine   . High cholesterol   . Plantar fasciitis   . Neuropathic pain of foot   . Chronic back pain   . Sciatica    Past Surgical History  Procedure Laterality Date  . Back surgery    . Eye surgery    . Tubal ligation     Family History  Problem Relation Age of Onset  . Heart failure Mother   . COPD Mother   . Diabetes Mother   . Heart failure Father   . Diabetes Other    History  Substance Use Topics  . Smoking status: Current Every Day Smoker -- 1.00 packs/day    Types: Cigarettes  . Smokeless tobacco: Not on file  . Alcohol Use: No     Comment: occ   OB History   Grav Para Term Preterm Abortions TAB SAB Ect Mult Living                 Review of Systems  Constitutional: Negative for fever.  Musculoskeletal: Positive for arthralgias.  Skin: Negative for rash.  Neurological: Negative for numbness.      Allergies  Codeine; Benadryl; and Compazine  Home  Medications   Prior to Admission medications   Medication Sig Start Date End Date Taking? Authorizing Provider  acetaminophen (TYLENOL) 500 MG tablet Take 1,000 mg by mouth every 6 (six) hours as needed (pain). For pain   Yes Historical Provider, MD  ibuprofen (ADVIL,MOTRIN) 200 MG tablet Take 800 mg by mouth every 4 (four) hours as needed for headache (pain).    Yes Historical Provider, MD   BP 144/86  Pulse 91  Temp(Src) 99.8 F (37.7 C) (Oral)  Resp 20  Ht  (1.753 m)  Wt 275 lb (124.739 kg)  BMI 40.59 kg/m2  LMP 03/01/2014 Physical Exam  Nursing note and vitals reviewed. Constitutional: She is oriented to person, place, and time. She appears well-developed and well-nourished.  Non-toxic appearance.  HENT:  Head: Normocephalic.  Right Ear: Tympanic membrane and external ear normal.  Left Ear: Tympanic membrane and external ear normal.  Eyes: EOM and lids are normal. Pupils are equal, round, and reactive to light.  Neck: Normal range of motion. Neck supple. Carotid bruit is not present.  Cardiovascular: Normal rate, regular rhythm, normal heart sounds, intact distal pulses and normal pulses.   Pulmonary/Chest: Breath sounds [id number] respiratory distress.  Abdominal: Soft. Bowel sounds are normal. There is no  tenderness. There is no guarding.  Musculoskeletal:       Right shoulder: She exhibits decreased range of motion, tenderness and pain. She exhibits no deformity, normal pulse and normal strength.  Pain mostly at the bursa area and AC joint. No hot areas.  Lymphadenopathy:       Head (right side): No submandibular adenopathy present.       Head (left side): No submandibular adenopathy present.    She has no cervical adenopathy.  Neurological: She is alert and oriented to person, place, and time. She has normal strength. No cranial nerve deficit or sensory deficit.  Skin: Skin is warm and dry.  Psychiatric: She has a normal mood and affect. Her speech is normal.     ED Course  Procedures (including critical care time) Labs Review Labs Reviewed - No data to display  Imaging Review No results found.   EKG Interpretation None      MDM  No dislocation. No evidence for septic joint. No hx of injury to suggest trauma. Pt to follow up with Dr Romeo Apple, as he has evaluated the shoulder in the past.   Final diagnoses:  None    *I have reviewed nursing notes, vital signs, and all appropriate lab and imaging results for this patient.Kathie Dike, PA-C 04/01/14 [phone]

## 2014-05-22 ENCOUNTER — Emergency Department (HOSPITAL_COMMUNITY)
Admission: EM | Admit: 2014-05-22 | Discharge: 2014-05-22 | Disposition: A | Discharge status: Home / Self Care | Payer: BC Managed Care – PPO | Attending: Emergency Medicine | Admitting: Emergency Medicine

## 2014-05-22 ENCOUNTER — Encounter (HOSPITAL_COMMUNITY): Payer: Self-pay | Admitting: Emergency Medicine

## 2014-05-22 ENCOUNTER — Emergency Department (HOSPITAL_COMMUNITY): Payer: BC Managed Care – PPO

## 2014-05-22 DIAGNOSIS — Z720 Tobacco use: Secondary | ICD-10-CM | POA: Insufficient documentation

## 2014-05-22 DIAGNOSIS — R5383 Other fatigue: Secondary | ICD-10-CM | POA: Insufficient documentation

## 2014-05-22 DIAGNOSIS — E119 Type 2 diabetes mellitus without complications: Secondary | ICD-10-CM | POA: Insufficient documentation

## 2014-05-22 DIAGNOSIS — G8929 Other chronic pain: Secondary | ICD-10-CM | POA: Insufficient documentation

## 2014-05-22 DIAGNOSIS — Z8639 Personal history of other endocrine, nutritional and metabolic disease: Secondary | ICD-10-CM | POA: Insufficient documentation

## 2014-05-22 DIAGNOSIS — M722 Plantar fascial fibromatosis: Secondary | ICD-10-CM | POA: Insufficient documentation

## 2014-05-22 DIAGNOSIS — Z8659 Personal history of other mental and behavioral disorders: Secondary | ICD-10-CM | POA: Insufficient documentation

## 2014-05-22 DIAGNOSIS — J40 Bronchitis, not specified as acute or chronic: Secondary | ICD-10-CM

## 2014-05-22 DIAGNOSIS — I10 Essential (primary) hypertension: Secondary | ICD-10-CM | POA: Insufficient documentation

## 2014-05-22 LAB — COMPREHENSIVE METABOLIC PANEL
ALT: 8 U/L (ref 0–35)
AST: 10 U/L (ref 0–37)
Albumin: 3.6 g/dL (ref 3.5–5.2)
Alkaline Phosphatase: 52 U/L (ref 39–117)
Anion gap: 13 (ref 5–15)
BUN: 16 mg/dL (ref 6–23)
CO2: 24 mEq/L (ref 19–32)
Calcium: 9.5 mg/dL (ref 8.4–10.5)
Chloride: 100 mEq/L (ref 96–112)
Creatinine, Ser: 0.61 mg/dL (ref 0.50–1.10)
GFR calc Af Amer: >90 mL/min (ref >90)
GFR calc non Af Amer: >90 mL/min (ref >90)
Glucose, Bld: 193 mg/dL — ABNORMAL HIGH (ref 70–99)
Potassium: 3.9 mEq/L (ref 3.7–5.3)
Sodium: 137 mEq/L (ref 137–147)
Total Bilirubin: <0.2 mg/dL — ABNORMAL LOW (ref 0.3–1.2)
Total Protein: 7.1 g/dL (ref 6.0–8.3)

## 2014-05-22 LAB — CBC
HCT: 34.1 % — ABNORMAL LOW (ref 36.0–46.0)
Hemoglobin: 11.2 g/dL — ABNORMAL LOW (ref 12.0–15.0)
MCH: 27.4 pg (ref 26.0–34.0)
MCHC: 32.8 g/dL (ref 30.0–36.0)
MCV: 83.4 fL (ref 78.0–100.0)
Platelets: 295 10*3/uL (ref 150–400)
RBC: 4.09 MIL/uL (ref 3.87–5.11)
RDW: 13.7 % (ref 11.5–15.5)
WBC: 7.6 10*3/uL (ref 4.0–10.5)

## 2014-05-22 MED ORDER — IBUPROFEN 400 MG PO TABS
600.0000 mg | ORAL_TABLET | Freq: Once | ORAL | Status: AC
  Administered 2014-05-22: 600 mg via ORAL
  Filled 2014-05-22: qty 2

## 2014-05-22 MED ORDER — DEXAMETHASONE 4 MG PO TABS: 8.0000 mg | ORAL_TABLET | Freq: Every day | ORAL | Status: DC

## 2014-05-22 MED ORDER — IPRATROPIUM-ALBUTEROL 0.5-2.5 (3) MG/3ML IN SOLN
3.0000 mL | Freq: Once | RESPIRATORY_TRACT | Status: AC
  Administered 2014-05-22: 3 mL via RESPIRATORY_TRACT
  Filled 2014-05-22: qty 3

## 2014-05-22 MED ORDER — ALBUTEROL SULFATE (2.5 MG/3ML) 0.083% IN NEBU
INHALATION_SOLUTION | RESPIRATORY_TRACT | Status: AC
  Filled 2014-05-22: qty 3

## 2014-05-22 MED ORDER — ALBUTEROL SULFATE HFA 108 (90 BASE) MCG/ACT IN AERS: 2.0000 | INHALATION_SPRAY | RESPIRATORY_TRACT | Status: DC | PRN

## 2014-05-22 MED ORDER — DEXAMETHASONE 4 MG PO TABS
12.0000 mg | ORAL_TABLET | Freq: Once | ORAL | Status: AC
  Administered 2014-05-22: 12 mg via ORAL
  Filled 2014-05-22: qty 3

## 2014-05-22 MED ORDER — BENZONATATE 100 MG PO CAPS: 100.0000 mg | ORAL_CAPSULE | Freq: Three times a day (TID) | ORAL | Status: DC | PRN

## 2014-05-22 MED ORDER — ALBUTEROL SULFATE (2.5 MG/3ML) 0.083% IN NEBU
2.5000 mg | INHALATION_SOLUTION | Freq: Once | RESPIRATORY_TRACT | Status: AC
  Administered 2014-05-22: 2.5 mg via RESPIRATORY_TRACT

## 2014-05-22 NOTE — ED Notes (Signed)
Pt reports generalized body aches x2 days. Pt reports lost her voice,dry cough and chest heaviness since last night. Pt alert and oriented. Airway patent.

## 2014-05-22 NOTE — ED Notes (Signed)
Patient inquiring about her discharge papers at this time. Informed patient she should be ready to go in a few minutes.

## 2014-05-28 NOTE — ED Provider Notes (Signed)
CSN: [id number]     Arrival date & time 05/22/14  1751 History   First MD Initiated Contact with Patient 05/22/14 1937     Chief Complaint  Patient presents with  . Cough     (Consider location/radiation/quality/duration/timing/severity/associated sxs/prior Treatment) HPI   Female with cough and congestion for the past 2-3 days. She is losing her voice. Her throat feels scratchy. Generalized body aches and fatigue. No fevers or chills. Cough is nonproductive. No unusual leg pain or swelling. No rash. No urinary complaints. No sick contacts.patient is a smoker. Denies drug use.  Past Medical History  Diagnosis Date  . Diabetes mellitus   . Depression   . Anxiety   . Hypertension   . Migraine   . High cholesterol   . Plantar fasciitis   . Neuropathic pain of foot   . Chronic back pain   . Sciatica    Past Surgical History  Procedure Laterality Date  . Back surgery    . Eye surgery    . Tubal ligation     Family History  Problem Relation Age of Onset  . Heart failure Mother   . COPD Mother   . Diabetes Mother   . Heart failure Father   . Diabetes Other    History  Substance Use Topics  . Smoking status: Current Every Day Smoker -- 1.00 packs/day    Types: Cigarettes  . Smokeless tobacco: Not on file  . Alcohol Use: No     Comment: occ   OB History    No data available     Review of Systems  All systems reviewed and negative, other than as noted in HPI.   Allergies  Codeine; Benadryl; and Compazine  Home Medications   Prior to Admission medications   Medication Sig Start Date End Date Taking? Authorizing Provider  acetaminophen (TYLENOL) 500 MG tablet Take 1,000 mg by mouth every 6 (six) hours as needed (pain). For pain   Yes Historical Provider, MD  ibuprofen (ADVIL,MOTRIN) 200 MG tablet Take 800 mg by mouth every 4 (four) hours as needed for headache (pain).    Yes Historical Provider, MD  albuterol (PROVENTIL HFA;VENTOLIN HFA) 108 (90 BASE) MCG/ACT  inhaler Inhale 2 puffs into the lungs every 4 (four) hours as needed for wheezing or shortness of breath. 05/22/14   Raeford RazorStephen Labria Wos, MD  benzonatate (TESSALON) 100 MG capsule Take 1 capsule (100 mg total) by mouth 3 (three) times daily as needed for cough. 05/22/14   Raeford RazorStephen Aleia Larocca, MD  dexamethasone (DECADRON) 4 MG tablet Take 2 tablets (8 mg total) by mouth daily. 05/22/14   Raeford RazorStephen Davyn Elsasser, MD   BP 139/74 mmHg  Pulse 96  Temp(Src) 98.9 F (37.2 C) (Oral)  Resp 18  Ht 5\' 9"  (1.753 m)  Wt 252 lb 14.4 oz (114.715 kg)  BMI 37.33 kg/m2  SpO2 99%  LMP 05/01/2014 Physical Exam  Constitutional: She appears well-developed and well-nourished. No distress.  HENT:  Head: Normocephalic and atraumatic.  Right Ear: External ear normal.  Mouth/Throat: Oropharynx is clear and moist.  Eyes: Conjunctivae and EOM are normal. Pupils are equal, round, and reactive to light. Right eye exhibits no discharge. Left eye exhibits no discharge.  Neck: Neck supple.  Cardiovascular: Normal rate, regular rhythm and normal heart sounds.  Exam reveals no gallop and no friction rub.   No murmur heard. Pulmonary/Chest: Effort normal. No respiratory distress. She has wheezes.  Occasional expiratory wheezing bilaterally. No increased work of breathing. Symmetric chest  rise. Able to speak in complete sentences.  Abdominal: Soft. She exhibits no distension. There is no tenderness.  Musculoskeletal: She exhibits no edema or tenderness.  Lower extremities symmetric as compared to each other. No calf tenderness. Negative Homan's. No palpable cords.   Neurological: She is alert.  Skin: Skin is warm and dry.  Psychiatric: She has a normal mood and affect. Her behavior is normal. Thought content normal.  Nursing note and vitals reviewed.   ED Course  Procedures (including critical care time) Labs Review Labs Reviewed  CBC - Abnormal; Notable for the following:    Hemoglobin 11.2 (*)    HCT 34.1 (*)    All other  components within normal limits  COMPREHENSIVE METABOLIC PANEL - Abnormal; Notable for the following:    Glucose, Bld 193 (*)    Total Bilirubin <0.2 (*)    All other components within normal limits    Imaging Review No results found.   EKG Interpretation   Date/Time:  Friday May 22 2014 18:00:09 EDT Ventricular Rate:  93 PR Interval:  152 QRS Duration: 90 QT Interval:  368 QTC Calculation: 457 R Axis:   74 Text Interpretation:  Normal sinus rhythm Normal ECG ED PHYSICIAN  INTERPRETATION AVAILABLE IN [facility] Confirmed by TEST, Record  ([id number]) on 05/24/2014 11:49:50 AM      MDM   Final diagnoses:  Bronchitis    38 year old female likely viral illness. Very low suspicion for serious bacterial infection. She is nontoxic. Plan stenotic treatment. Return precautions were discussed.    Raeford RazorStephen Oluwatamilore Starnes, MD 05/28/14 [phone]

## 2014-07-22 ENCOUNTER — Encounter (HOSPITAL_COMMUNITY): Payer: Self-pay | Admitting: Emergency Medicine

## 2014-07-22 ENCOUNTER — Emergency Department (HOSPITAL_COMMUNITY): Payer: Self-pay

## 2014-07-22 ENCOUNTER — Emergency Department (HOSPITAL_COMMUNITY)
Admission: EM | Admit: 2014-07-22 | Discharge: 2014-07-23 | Disposition: A | Discharge status: Home / Self Care | Payer: Self-pay | Attending: Emergency Medicine | Admitting: Emergency Medicine

## 2014-07-22 DIAGNOSIS — R5383 Other fatigue: Secondary | ICD-10-CM | POA: Insufficient documentation

## 2014-07-22 DIAGNOSIS — Z7952 Long term (current) use of systemic steroids: Secondary | ICD-10-CM | POA: Insufficient documentation

## 2014-07-22 DIAGNOSIS — R51 Headache: Secondary | ICD-10-CM | POA: Insufficient documentation

## 2014-07-22 DIAGNOSIS — Z720 Tobacco use: Secondary | ICD-10-CM | POA: Insufficient documentation

## 2014-07-22 DIAGNOSIS — Z8739 Personal history of other diseases of the musculoskeletal system and connective tissue: Secondary | ICD-10-CM | POA: Insufficient documentation

## 2014-07-22 DIAGNOSIS — R0602 Shortness of breath: Secondary | ICD-10-CM | POA: Insufficient documentation

## 2014-07-22 DIAGNOSIS — N390 Urinary tract infection, site not specified: Secondary | ICD-10-CM | POA: Insufficient documentation

## 2014-07-22 DIAGNOSIS — R519 Headache, unspecified: Secondary | ICD-10-CM

## 2014-07-22 DIAGNOSIS — G8929 Other chronic pain: Secondary | ICD-10-CM | POA: Insufficient documentation

## 2014-07-22 DIAGNOSIS — R0600 Dyspnea, unspecified: Secondary | ICD-10-CM | POA: Insufficient documentation

## 2014-07-22 DIAGNOSIS — E119 Type 2 diabetes mellitus without complications: Secondary | ICD-10-CM | POA: Insufficient documentation

## 2014-07-22 DIAGNOSIS — I10 Essential (primary) hypertension: Secondary | ICD-10-CM | POA: Insufficient documentation

## 2014-07-22 DIAGNOSIS — Z8659 Personal history of other mental and behavioral disorders: Secondary | ICD-10-CM | POA: Insufficient documentation

## 2014-07-22 DIAGNOSIS — R42 Dizziness and giddiness: Secondary | ICD-10-CM | POA: Insufficient documentation

## 2014-07-22 DIAGNOSIS — Z79899 Other long term (current) drug therapy: Secondary | ICD-10-CM | POA: Insufficient documentation

## 2014-07-22 LAB — CBC WITH DIFFERENTIAL/PLATELET
Basophils Absolute: 0.1 10*3/uL (ref 0.0–0.1)
Basophils Relative: 1 % (ref 0–1)
Eosinophils Absolute: 0.2 10*3/uL (ref 0.0–0.7)
Eosinophils Relative: 2 % (ref 0–5)
HCT: 38.2 % (ref 36.0–46.0)
Hemoglobin: 12.2 g/dL (ref 12.0–15.0)
Lymphocytes Relative: 34 % (ref 12–46)
Lymphs Abs: 3.2 10*3/uL (ref 0.7–4.0)
MCH: 26.8 pg (ref 26.0–34.0)
MCHC: 31.9 g/dL (ref 30.0–36.0)
MCV: 83.8 fL (ref 78.0–100.0)
Monocytes Absolute: 0.6 10*3/uL (ref 0.1–1.0)
Monocytes Relative: 6 % (ref 3–12)
Neutro Abs: 5.6 10*3/uL (ref 1.7–7.7)
Neutrophils Relative %: 57 % (ref 43–77)
Platelets: 375 10*3/uL (ref 150–400)
RBC: 4.56 MIL/uL (ref 3.87–5.11)
RDW: 14.2 % (ref 11.5–15.5)
WBC: 9.6 10*3/uL (ref 4.0–10.5)

## 2014-07-22 LAB — BASIC METABOLIC PANEL
Anion gap: 9 (ref 5–15)
BUN: 11 mg/dL (ref 6–23)
CO2: 20 mmol/L (ref 19–32)
Calcium: 9.3 mg/dL (ref 8.4–10.5)
Chloride: 108 mEq/L (ref 96–112)
Creatinine, Ser: 0.55 mg/dL (ref 0.50–1.10)
GFR calc Af Amer: >90 mL/min (ref >90)
GFR calc non Af Amer: >90 mL/min (ref >90)
Glucose, Bld: 257 mg/dL — ABNORMAL HIGH (ref 70–99)
Potassium: 3.6 mmol/L (ref 3.5–5.1)
Sodium: 137 mmol/L (ref 135–145)

## 2014-07-22 LAB — URINALYSIS, ROUTINE W REFLEX MICROSCOPIC
Bilirubin Urine: NEGATIVE
Glucose, UA: >1000 mg/dL — AB
Hgb urine dipstick: NEGATIVE
Ketones, ur: NEGATIVE mg/dL
Nitrite: NEGATIVE
Protein, ur: NEGATIVE mg/dL
Specific Gravity, Urine: 1.025 (ref 1.005–1.030)
Urobilinogen, UA: 0.2 mg/dL (ref 0.0–1.0)
pH: 5.5 (ref 5.0–8.0)

## 2014-07-22 LAB — URINE MICROSCOPIC-ADD ON

## 2014-07-22 LAB — TROPONIN I: Troponin I: <0.03 ng/mL (ref <0.031)

## 2014-07-22 NOTE — ED Provider Notes (Signed)
CSN: [id number]     Arrival date & time 07/22/14  2054 History   First MD Initiated Contact with Patient 07/22/14 2251     Chief Complaint  Patient presents with  . Dizziness  . Headache     (Consider location/radiation/quality/duration/timing/severity/associated sxs/prior Treatment) HPI Comments: Weight on chest feels hard to breathe  Patient is a 38 y.o. female presenting with dizziness and headaches. The history is provided by the patient.  Dizziness Quality:  Vertigo Duration:  1 day Relieved by:  Nothing Associated symptoms: headaches and shortness of breath   Associated symptoms: no blood in stool, no chest pain and no palpitations   Headache Associated symptoms: dizziness and fatigue   Associated symptoms: no abdominal pain, no back pain, no cough, no neck pain, no photophobia and no seizures     Past Medical History  Diagnosis Date  . Diabetes mellitus   . Depression   . Anxiety   . Hypertension   . Migraine   . High cholesterol   . Plantar fasciitis   . Neuropathic pain of foot   . Chronic back pain   . Sciatica    Past Surgical History  Procedure Laterality Date  . Back surgery    . Eye surgery    . Tubal ligation     Family History  Problem Relation Age of Onset  . Heart failure Mother   . COPD Mother   . Diabetes Mother   . Heart failure Father   . Diabetes Other    History  Substance Use Topics  . Smoking status: Current Every Day Smoker -- 1.00 packs/day    Types: Cigarettes  . Smokeless tobacco: Not on file  . Alcohol Use: No     Comment: occ   OB History    No data available     Review of Systems  Constitutional: Positive for activity change and fatigue.       All ROS Neg except as noted in HPI  HENT: Negative for nosebleeds.   Eyes: Negative for photophobia and discharge.  Respiratory: Positive for shortness of breath. Negative for cough and wheezing.   Cardiovascular: Negative for chest pain and palpitations.  Gastrointestinal:  Negative for abdominal pain and blood in stool.  Genitourinary: Negative for dysuria, frequency and hematuria.  Musculoskeletal: Negative for back pain, arthralgias and neck pain.  Skin: Negative.   Neurological: Positive for dizziness and headaches. Negative for seizures and speech difficulty.  Psychiatric/Behavioral: Negative for hallucinations and confusion.      Allergies  Codeine; Benadryl; and Compazine  Home Medications   Prior to Admission medications   Medication Sig Start Date End Date Taking? Authorizing Provider  acetaminophen (TYLENOL) 500 MG tablet Take 1,000 mg by mouth every 6 (six) hours as needed (pain). For pain   Yes Historical Provider, MD  ibuprofen (ADVIL,MOTRIN) 200 MG tablet Take 800 mg by mouth every 4 (four) hours as needed for headache (pain).    Yes Historical Provider, MD  albuterol (PROVENTIL HFA;VENTOLIN HFA) 108 (90 BASE) MCG/ACT inhaler Inhale 2 puffs into the lungs every 4 (four) hours as needed for wheezing or shortness of breath. Patient not taking: Reported on 07/22/2014 05/22/14   Raeford RazorStephen Kohut, MD  benzonatate (TESSALON) 100 MG capsule Take 1 capsule (100 mg total) by mouth 3 (three) times daily as needed for cough. Patient not taking: Reported on 07/22/2014 05/22/14   Raeford RazorStephen Kohut, MD  dexamethasone (DECADRON) 4 MG tablet Take 2 tablets (8 mg total) by mouth daily.  Patient not taking: Reported on 07/22/2014 05/22/14   Raeford RazorStephen Kohut, MD   BP 142/74 mmHg  Pulse 97  Temp(Src) 98 F (36.7 C) (Oral)  Resp 20  Ht 5\' 9"  (1.753 m)  Wt 250 lb (113.399 kg)  BMI 36.90 kg/m2  SpO2 100%  LMP 06/18/2014 Physical Exam  Constitutional: She is oriented to person, place, and time. She appears well-developed and well-nourished.  Non-toxic appearance.  HENT:  Head: Normocephalic.  Right Ear: Tympanic membrane and external ear normal.  Left Ear: Tympanic membrane and external ear normal.  Eyes: EOM and lids are normal. Pupils are equal, round, and  reactive to light.  Neck: Normal range of motion. Neck supple. Carotid bruit is not present.  Cardiovascular: Normal rate, regular rhythm, normal heart sounds, intact distal pulses and normal pulses.   Pulmonary/Chest: No respiratory distress. She has rhonchi.  Abdominal: Soft. Bowel sounds are normal. There is no tenderness. There is no guarding.  Musculoskeletal: Normal range of motion. She exhibits no edema or tenderness.  nEG Homan's sign. No lower extremity edema.  Lymphadenopathy:       Head (right side): No submandibular adenopathy present.       Head (left side): No submandibular adenopathy present.    She has no cervical adenopathy.  Neurological: She is alert and oriented to person, place, and time. She has normal strength. No cranial nerve deficit or sensory deficit. She exhibits normal muscle tone. Coordination normal.  Skin: Skin is warm and dry.  Psychiatric: She has a normal mood and affect. Her speech is normal.  Nursing note and vitals reviewed.   ED Course  Procedures (including critical care time) Labs Review Labs Reviewed - No data to display  Imaging Review No results found.   EKG Interpretation None      MDM  Vital signs are stable. Pulse ox 100% on room air.  Chest xray and EKG are non-acute. UA suggest UTI. Culture sent to the lab. CBC wnl Glucose 257 elevated.  Plan - albuterol inhaler, norco for headache. Keflex for UTI.   Final diagnoses:  Dyspnea  UTI (lower urinary tract infection)  Headache, unspecified headache type    **I have reviewed nursing notes, vital signs, and all appropriate lab and imaging results for this patient.Kathie Dike*    Ananya Mccleese M Ruhi Kopke, PA-C 07/23/14 0038  Joya Gaskinsonald W Wickline, MD 07/23/14 [phone]

## 2014-07-22 NOTE — ED Notes (Addendum)
Patient complaining of headache to right side of head that started yesterday. History of migraines. Patient states dizziness and started feeling lightheaded a couple of hours ago with pain radiating into neck. Patient also reports she feels short of breath. Reports symptoms started a couple of hours ago.

## 2014-07-23 MED ORDER — CEPHALEXIN 500 MG PO CAPS: 500.0000 mg | ORAL_CAPSULE | Freq: Four times a day (QID) | ORAL | Status: DC

## 2014-07-23 MED ORDER — ALBUTEROL SULFATE HFA 108 (90 BASE) MCG/ACT IN AERS: 2.0000 | INHALATION_SPRAY | Freq: Four times a day (QID) | RESPIRATORY_TRACT | Status: DC | PRN

## 2014-07-23 MED ORDER — ONDANSETRON HCL 4 MG PO TABS
4.0000 mg | ORAL_TABLET | Freq: Once | ORAL | Status: AC
  Administered 2014-07-23: 4 mg via ORAL
  Filled 2014-07-23: qty 1

## 2014-07-23 MED ORDER — CEPHALEXIN 500 MG PO CAPS
500.0000 mg | ORAL_CAPSULE | Freq: Once | ORAL | Status: AC
  Administered 2014-07-23: 500 mg via ORAL
  Filled 2014-07-23: qty 1

## 2014-07-23 MED ORDER — HYDROCODONE-ACETAMINOPHEN 5-325 MG PO TABS: 1.0000 | ORAL_TABLET | ORAL | Status: DC | PRN

## 2014-07-23 MED ORDER — HYDROCODONE-ACETAMINOPHEN 5-325 MG PO TABS
2.0000 | ORAL_TABLET | Freq: Once | ORAL | Status: AC
Start: 2014-07-23 — End: 2014-07-23
  Administered 2014-07-23: 2 via ORAL
  Filled 2014-07-23: qty 2

## 2014-07-23 NOTE — Discharge Instructions (Signed)
Urinary Tract Infection YOUR URINE TEST SUGGEST UTI. YOUR CHEST XRAY AND BLOOD WORK ARE NEGATIVE FOR ACUTE PROBLEM. PLEASE INCREASE FLUIDS. USE MEDICATIONS AS SUGGESTED. PLEASE SEE YOUR PRIMARY MD FOR EVALUATION IF NOT IMPROVING.                                                                                                              Urinary tract infections (UTIs) can develop anywhere along your urinary tract. Your urinary tract is your body's drainage system for removing wastes and extra water. Your urinary tract includes two kidneys, two ureters, a bladder, and a urethra. Your kidneys are a pair of bean-shaped organs. Each kidney is about the size of your fist. They are located below your ribs, one on each side of your spine. CAUSES Infections are caused by microbes, which are microscopic organisms, including fungi, viruses, and bacteria. These organisms are so small that they can only be seen through a microscope. Bacteria are the microbes that most commonly cause UTIs. SYMPTOMS  Symptoms of UTIs may vary by age and gender of the patient and by the location of the infection. Symptoms in young women typically include a frequent and intense urge to urinate and a painful, burning feeling in the bladder or urethra during urination. Older women and men are more likely to be tired, shaky, and weak and have muscle aches and abdominal pain. A fever may mean the infection is in your kidneys. Other symptoms of a kidney infection include pain in your back or sides below the ribs, nausea, and vomiting. DIAGNOSIS To diagnose a UTI, your caregiver will ask you about your symptoms. Your caregiver also will ask to provide a urine sample. The urine sample will be tested for bacteria and white blood cells. White blood cells are made by your body to help fight infection. TREATMENT  Typically, UTIs can be treated with medication. Because most UTIs are caused by a bacterial infection, they usually can be treated with the  use of antibiotics. The choice of antibiotic and length of treatment depend on your symptoms and the type of bacteria causing your infection. HOME CARE INSTRUCTIONS  If you were prescribed antibiotics, take them exactly as your caregiver instructs you. Finish the medication even if you feel better after you have only taken some of the medication.  Drink enough water and fluids to keep your urine clear or pale yellow.  Avoid caffeine, tea, and carbonated beverages. They tend to irritate your bladder.  Empty your bladder often. Avoid holding urine for long periods of time.  Empty your bladder before and after sexual intercourse.  After a bowel movement, women should cleanse from front to back. Use each tissue only once. SEEK MEDICAL CARE IF:   You have back pain.  You develop a fever.  Your symptoms do not begin to resolve within 3 days. SEEK IMMEDIATE MEDICAL CARE IF:   You have severe back pain or lower abdominal pain.  You develop chills.  You have nausea or vomiting.  You have continued burning or  discomfort with urination. MAKE SURE YOU:   Understand these instructions.  Will watch your condition.  Will get help right away if you are not doing well or get worse. Document Released: 04/19/2005 Document Revised: 01/09/2012 Document Reviewed: 08/18/2011 [facility]Exit[facility] Patient Information 2015 [place], [place]. This information is not intended to replace advice given to you by your health care provider. Make sure you discuss any questions you have with your health care provider.

## 2014-07-26 LAB — URINE CULTURE: Colony Count: 45000

## 2014-09-18 ENCOUNTER — Encounter (HOSPITAL_COMMUNITY): Payer: Self-pay | Admitting: Emergency Medicine

## 2014-09-18 ENCOUNTER — Emergency Department (HOSPITAL_COMMUNITY): Payer: Self-pay

## 2014-09-18 ENCOUNTER — Emergency Department (HOSPITAL_COMMUNITY)
Admission: EM | Admit: 2014-09-18 | Discharge: 2014-09-18 | Disposition: A | Discharge status: Home / Self Care | Payer: Self-pay | Attending: Emergency Medicine | Admitting: Emergency Medicine

## 2014-09-18 DIAGNOSIS — Z79899 Other long term (current) drug therapy: Secondary | ICD-10-CM | POA: Insufficient documentation

## 2014-09-18 DIAGNOSIS — Y9389 Activity, other specified: Secondary | ICD-10-CM | POA: Insufficient documentation

## 2014-09-18 DIAGNOSIS — X58XXXA Exposure to other specified factors, initial encounter: Secondary | ICD-10-CM | POA: Insufficient documentation

## 2014-09-18 DIAGNOSIS — F419 Anxiety disorder, unspecified: Secondary | ICD-10-CM | POA: Insufficient documentation

## 2014-09-18 DIAGNOSIS — G8929 Other chronic pain: Secondary | ICD-10-CM | POA: Insufficient documentation

## 2014-09-18 DIAGNOSIS — F329 Major depressive disorder, single episode, unspecified: Secondary | ICD-10-CM | POA: Insufficient documentation

## 2014-09-18 DIAGNOSIS — Z8739 Personal history of other diseases of the musculoskeletal system and connective tissue: Secondary | ICD-10-CM | POA: Insufficient documentation

## 2014-09-18 DIAGNOSIS — I10 Essential (primary) hypertension: Secondary | ICD-10-CM | POA: Insufficient documentation

## 2014-09-18 DIAGNOSIS — Z792 Long term (current) use of antibiotics: Secondary | ICD-10-CM | POA: Insufficient documentation

## 2014-09-18 DIAGNOSIS — Y998 Other external cause status: Secondary | ICD-10-CM | POA: Insufficient documentation

## 2014-09-18 DIAGNOSIS — Z720 Tobacco use: Secondary | ICD-10-CM | POA: Insufficient documentation

## 2014-09-18 DIAGNOSIS — Y9289 Other specified places as the place of occurrence of the external cause: Secondary | ICD-10-CM | POA: Insufficient documentation

## 2014-09-18 DIAGNOSIS — E119 Type 2 diabetes mellitus without complications: Secondary | ICD-10-CM | POA: Insufficient documentation

## 2014-09-18 DIAGNOSIS — S93401A Sprain of unspecified ligament of right ankle, initial encounter: Secondary | ICD-10-CM | POA: Insufficient documentation

## 2014-09-18 MED ORDER — NAPROXEN 500 MG PO TABS: 500.0000 mg | ORAL_TABLET | Freq: Two times a day (BID) | ORAL | Status: DC

## 2014-09-18 MED ORDER — TRAMADOL HCL 50 MG PO TABS: 50.0000 mg | ORAL_TABLET | Freq: Four times a day (QID) | ORAL | Status: DC | PRN

## 2014-09-18 MED ORDER — NAPROXEN 250 MG PO TABS
500.0000 mg | ORAL_TABLET | Freq: Once | ORAL | Status: AC
  Administered 2014-09-18: 500 mg via ORAL
  Filled 2014-09-18: qty 2

## 2014-09-18 MED ORDER — TRAMADOL HCL 50 MG PO TABS
50.0000 mg | ORAL_TABLET | Freq: Once | ORAL | Status: AC
  Administered 2014-09-18: 50 mg via ORAL
  Filled 2014-09-18: qty 1

## 2014-09-18 NOTE — ED Notes (Signed)
Pt reports right ankle/leg pain after stepping out of a car today. No obvious deformity noted.

## 2014-09-18 NOTE — ED Notes (Signed)
Pain rt ankle, medial malleolus, "stepped wrong when getting out of van" today,  Minimal swelling , Painful wt bearing.

## 2014-09-18 NOTE — Discharge Instructions (Signed)
Ankle Sprain °An ankle sprain is an injury to the strong, fibrous tissues (ligaments) that hold the bones of your ankle joint together.  °CAUSES °An ankle sprain is usually caused by a fall or by twisting your ankle. Ankle sprains most commonly occur when you step on the outer edge of your foot, and your ankle turns inward. People who participate in sports are more prone to these types of injuries.  °SYMPTOMS  °· Pain in your ankle. The pain may be present at rest or only when you are trying to stand or walk. °· Swelling. °· Bruising. Bruising may develop immediately or within 1 to 2 days after your injury. °· Difficulty standing or walking, particularly when turning corners or changing directions. °DIAGNOSIS  °Your caregiver will ask you details about your injury and perform a physical exam of your ankle to determine if you have an ankle sprain. During the physical exam, your caregiver will press on and apply pressure to specific areas of your foot and ankle. Your caregiver will try to move your ankle in certain ways. An X-ray exam may be done to be sure a bone was not broken or a ligament did not separate from one of the bones in your ankle (avulsion fracture).  °TREATMENT  °Certain types of braces can help stabilize your ankle. Your caregiver can make a recommendation for this. Your caregiver may recommend the use of medicine for pain. If your sprain is severe, your caregiver may refer you to a surgeon who helps to restore function to parts of your skeletal system (orthopedist) or a physical therapist. °HOME CARE INSTRUCTIONS  °· Apply ice to your injury for 1-2 days or as directed by your caregiver. Applying ice helps to reduce inflammation and pain. °¨ Put ice in a plastic bag. °¨ Place a towel between your skin and the bag. °¨ Leave the ice on for 15-20 minutes at a time, every 2 hours while you are awake. °· Only take over-the-counter or prescription medicines for pain, discomfort, or fever as directed by  your caregiver. °· Elevate your injured ankle above the level of your heart as much as possible for 2-3 days. °· If your caregiver recommends crutches, use them as instructed. Gradually put weight on the affected ankle. Continue to use crutches or a cane until you can walk without feeling pain in your ankle. °· If you have a plaster splint, wear the splint as directed by your caregiver. Do not rest it on anything harder than a pillow for the first 24 hours. Do not put weight on it. Do not get it wet. You may take it off to take a shower or bath. °· You may have been given an elastic bandage to wear around your ankle to provide support. If the elastic bandage is too tight (you have numbness or tingling in your foot or your foot becomes cold and blue), adjust the bandage to make it comfortable. °· If you have an air splint, you may blow more air into it or let air out to make it more comfortable. You may take your splint off at night and before taking a shower or bath. Wiggle your toes in the splint several times per day to decrease swelling. °SEEK MEDICAL CARE IF:  °· You have rapidly increasing bruising or swelling. °· Your toes feel extremely cold or you lose feeling in your foot. °· Your pain is not relieved with medicine. °SEEK IMMEDIATE MEDICAL CARE IF: °· Your toes are numb or blue. °·   You have severe pain that is increasing. MAKE SURE YOU:   Understand these instructions.  Will watch your condition.  Will get help right away if you are not doing well or get worse. Document Released: 07/10/2005 Document Revised: 04/03/2012 Document Reviewed: 07/22/2011 [facility]ExitCare Patient Information 2015 [place], [place]. This information is not intended to replace advice given to you by your health care provider. Make sure you discuss any questions you have with your health care provider.    Wear the ASO and use crutches to avoid weight bearing.  Use ice and elevation as much as possible for the next several days to  help reduce the swelling.  Take the medications prescribed.  You may take the tramadol prescribed for pain relief.  This will make you drowsy - do not drive within 4 hours of taking this medication.  Use the ibuprofen also for inflammation.  Call the orthopedic doctor listed for a recheck of your injury in 1 week if you are not improving.  You may benefit from physical therapy of your ankle if it is not getting better over the next week.

## 2014-09-18 NOTE — ED Notes (Signed)
Patient with no complaints at this time. Respirations even and unlabored. Skin warm/dry. Discharge instructions reviewed with patient at this time. Patient given opportunity to voice concerns/ask questions. Patient discharged at this time and left Emergency Department with steady gait.   

## 2014-09-19 NOTE — ED Provider Notes (Signed)
CSN: [id number]     Arrival date & time 09/18/14  [id number] History   First MD Initiated Contact with Patient 09/18/14 1846     Chief Complaint  Patient presents with  . Leg Pain     (Consider location/radiation/quality/duration/timing/severity/associated sxs/prior Treatment) The history is provided by the patient.   Tina BattiestLaura W Pham is a 39 y.o. female presenting with right ankle pain which occurred suddenly when the patient stepped wrong getting out of her Tina Pham today.  She is unsure if she everted or inverted the foot, as it happened too fast.  She has medial anke pain which is aching, constant and worse with palpation, movement and weight bearing.  The patient was able to weight bear immediately after the event.  There is no radiation of pain and the patient denies numbness distal to the injury site.  The patients treatment prior to arrival included ice and tylenol without improvement in pain.      Past Medical History  Diagnosis Date  . Diabetes mellitus   . Depression   . Anxiety   . Hypertension   . Migraine   . High cholesterol   . Plantar fasciitis   . Neuropathic pain of foot   . Chronic back pain   . Sciatica    Past Surgical History  Procedure Laterality Date  . Back surgery    . Eye surgery    . Tubal ligation     Family History  Problem Relation Age of Onset  . Heart failure Mother   . COPD Mother   . Diabetes Mother   . Heart failure Father   . Diabetes Other    History  Substance Use Topics  . Smoking status: Current Every Day Smoker -- 1.00 packs/day    Types: Cigarettes  . Smokeless tobacco: Not on file  . Alcohol Use: No     Comment: occ   OB History    No data available     Review of Systems  Musculoskeletal: Positive for joint swelling and arthralgias.  Skin: Negative for wound.  Neurological: Negative for weakness and numbness.      Allergies  Codeine; Benadryl; and Compazine  Home Medications   Prior to Admission medications    Medication Sig Start Date End Date Taking? Authorizing Provider  acetaminophen (TYLENOL) 500 MG tablet Take 1,000 mg by mouth every 6 (six) hours as needed (pain). For pain    Historical Provider, MD  albuterol (PROVENTIL HFA;VENTOLIN HFA) 108 (90 BASE) MCG/ACT inhaler Inhale 2 puffs into the lungs every 6 (six) hours as needed for wheezing or shortness of breath. 07/23/14   Kathie DikeHobson M Bryant, PA-C  benzonatate (TESSALON) 100 MG capsule Take 1 capsule (100 mg total) by mouth 3 (three) times daily as needed for cough. Patient not taking: Reported on 07/22/2014 05/22/14   Raeford RazorStephen Kohut, MD  cephALEXin (KEFLEX) 500 MG capsule Take 1 capsule (500 mg total) by mouth 4 (four) times daily. 07/23/14   Kathie DikeHobson M Bryant, PA-C  dexamethasone (DECADRON) 4 MG tablet Take 2 tablets (8 mg total) by mouth daily. Patient not taking: Reported on 07/22/2014 05/22/14   Raeford RazorStephen Kohut, MD  HYDROcodone-acetaminophen (NORCO/VICODIN) 5-325 MG per tablet Take 1 tablet by mouth every 4 (four) hours as needed. 07/23/14   Kathie DikeHobson M Bryant, PA-C  ibuprofen (ADVIL,MOTRIN) 200 MG tablet Take 800 mg by mouth every 4 (four) hours as needed for headache (pain).     Historical Provider, MD  naproxen (NAPROSYN) 500 MG tablet Take 1  tablet (500 mg total) by mouth 2 (two) times daily. 09/18/14   Burgess Amor, PA-C  traMADol (ULTRAM) 50 MG tablet Take 1 tablet (50 mg total) by mouth every 6 (six) hours as needed. 09/18/14   Burgess Amor, PA-C   BP 145/75 mmHg  Pulse 86  Temp(Src) 98.9 F (37.2 C) (Oral)  Resp 14  Ht  (1.753 m)  Wt 265 lb (120.203 kg)  BMI 39.12 kg/m2  SpO2 100%  LMP 09/17/2014 Physical Exam  Constitutional: She appears well-developed and well-nourished.  HENT:  Head: Normocephalic.  Cardiovascular: Normal rate and intact distal pulses.  Exam reveals no decreased pulses.   Pulses:      Dorsalis pedis pulses are 2+ on the right side, and 2+ on the left side.       Posterior tibial pulses are 2+ on the right side,  and 2+ on the left side.  Musculoskeletal: She exhibits edema and tenderness.       Right ankle: She exhibits decreased range of motion, swelling and ecchymosis. She exhibits normal pulse. Tenderness. Medial malleolus tenderness found. No head of 5th metatarsal and no proximal fibula tenderness found. Achilles tendon normal.  Minimal edema noted at right medial malleolus.  Neurological: She is alert. No sensory deficit.  Skin: Skin is warm, dry and intact.  Nursing note and vitals reviewed.   ED Course  Procedures (including critical care time) Labs Review Labs Reviewed - No data to display  Imaging Review Dg Ankle Complete Right  09/18/2014   CLINICAL DATA:  Right ankle injury stepping out of a motor vehicle today. History of gout in both feet. History plantar fasciitis. Prior right ankle sprain several years ago.  EXAM: RIGHT ANKLE - COMPLETE 3+ VIEW  COMPARISON:  None.  FINDINGS: The malleoli, plafond, and talar dome appear intact.  Plantar and Achilles calcaneal spurs noted. No fracture or acute bony findings.  IMPRESSION: 1. Plantar and Achilles calcaneal spurs. 2. No fracture or acute bony findings.   Electronically Signed   By: Gaylyn Rong M.D.   On: 09/18/2014 19:13     EKG Interpretation None      MDM   Final diagnoses:  Ankle sprain, right, initial encounter    Crutches, aso, naproxen, tramadol.  Advised RICE, f/u with ortho if not improving over the next week.    Burgess Amor, PA-C 09/19/14 0101  Donnetta Hutching, MD 09/19/14 [phone]

## 2014-11-02 ENCOUNTER — Encounter (HOSPITAL_COMMUNITY): Payer: Self-pay | Admitting: Emergency Medicine

## 2014-11-02 ENCOUNTER — Emergency Department (HOSPITAL_COMMUNITY)
Admission: EM | Admit: 2014-11-02 | Discharge: 2014-11-02 | Disposition: A | Discharge status: Home / Self Care | Payer: Self-pay | Attending: Emergency Medicine | Admitting: Emergency Medicine

## 2014-11-02 DIAGNOSIS — Z791 Long term (current) use of non-steroidal anti-inflammatories (NSAID): Secondary | ICD-10-CM | POA: Insufficient documentation

## 2014-11-02 DIAGNOSIS — Z79899 Other long term (current) drug therapy: Secondary | ICD-10-CM | POA: Insufficient documentation

## 2014-11-02 DIAGNOSIS — M25571 Pain in right ankle and joints of right foot: Secondary | ICD-10-CM | POA: Insufficient documentation

## 2014-11-02 DIAGNOSIS — I10 Essential (primary) hypertension: Secondary | ICD-10-CM | POA: Insufficient documentation

## 2014-11-02 DIAGNOSIS — E119 Type 2 diabetes mellitus without complications: Secondary | ICD-10-CM | POA: Insufficient documentation

## 2014-11-02 DIAGNOSIS — Z8659 Personal history of other mental and behavioral disorders: Secondary | ICD-10-CM | POA: Insufficient documentation

## 2014-11-02 DIAGNOSIS — Z720 Tobacco use: Secondary | ICD-10-CM | POA: Insufficient documentation

## 2014-11-02 DIAGNOSIS — G8929 Other chronic pain: Secondary | ICD-10-CM | POA: Insufficient documentation

## 2014-11-02 MED ORDER — HYDROCODONE-ACETAMINOPHEN 5-325 MG PO TABS
1.0000 | ORAL_TABLET | Freq: Once | ORAL | Status: AC
  Administered 2014-11-02: 1 via ORAL
  Filled 2014-11-02: qty 1

## 2014-11-02 MED ORDER — DICLOFENAC SODIUM 50 MG PO TBEC: 50.0000 mg | DELAYED_RELEASE_TABLET | Freq: Two times a day (BID) | ORAL | Status: DC

## 2014-11-02 NOTE — ED Provider Notes (Signed)
CSN: [id number]     Arrival date & time 11/02/14  1547 History  This chart was scribed for non-physician practitioner, Kerrie BuffaloHope Neese, working with Benjiman CoreNathan Pickering, MD by Richarda Overlieichard Holland, ED Scribe. This patient was seen in room APFT21/[facility] and the patient's care was started at 5:06 PM.  Chief Complaint  Patient presents with  . Ankle Pain   The history is provided by the patient. No language interpreter was used.   HPI Comments: Tina BattiestLaura W Pham is a 39 y.o. female with a history of DM, HTN, chronic back pain and sciatica who presents to the Emergency Department complaining of recurrent ankle pain that worsened last week. She describes her pain as burning and states it feels like it is "pulling" when she walks. Pt states that she was seen in the ED on 09/18/14 and dx with ankle sprain. Pt states that her ankle was feeling better with the brace and crutches she was provided until her pain returned 1 week ago. Pt denies any new injury. She reports that her pain worsens with certain movements and weight bearing. Pt reports she is allergic to codeine and benadryl and compazine.   Past Medical History  Diagnosis Date  . Diabetes mellitus   . Depression   . Anxiety   . Hypertension   . Migraine   . High cholesterol   . Plantar fasciitis   . Neuropathic pain of foot   . Chronic back pain   . Sciatica    Past Surgical History  Procedure Laterality Date  . Back surgery    . Eye surgery    . Tubal ligation     Family History  Problem Relation Age of Onset  . Heart failure Mother   . COPD Mother   . Diabetes Mother   . Heart failure Father   . Diabetes Other    History  Substance Use Topics  . Smoking status: Current Every Day Smoker -- 1.00 packs/day    Types: Cigarettes  . Smokeless tobacco: Not on file  . Alcohol Use: No     Comment: occ   OB History    Gravida Para Term Preterm AB TAB SAB Ectopic Multiple Living            3     Review of Systems  Musculoskeletal:  Positive for arthralgias.  All other systems reviewed and are negative.  Allergies  Codeine; Benadryl; and Compazine  Home Medications   Prior to Admission medications   Medication Sig Start Date End Date Taking? Authorizing Provider  acetaminophen (TYLENOL) 500 MG tablet Take 2,000 mg by mouth once as needed for mild pain or moderate pain. For pain   Yes Historical Provider, MD  ibuprofen (ADVIL,MOTRIN) 200 MG tablet Take 800 mg by mouth every 4 (four) hours as needed for headache (pain).    Yes Historical Provider, MD  naproxen sodium (ANAPROX) 220 MG tablet Take 440 mg by mouth daily as needed (for pain).   Yes Historical Provider, MD  albuterol (PROVENTIL HFA;VENTOLIN HFA) 108 (90 BASE) MCG/ACT inhaler Inhale 2 puffs into the lungs every 6 (six) hours as needed for wheezing or shortness of breath. 07/23/14   Ivery QualeHobson Bryant, PA-C  benzonatate (TESSALON) 100 MG capsule Take 1 capsule (100 mg total) by mouth 3 (three) times daily as needed for cough. Patient not taking: Reported on 07/22/2014 05/22/14   Raeford RazorStephen Kohut, MD  diclofenac (VOLTAREN) 50 MG EC tablet Take 1 tablet (50 mg total) by mouth 2 (two) times daily.  11/02/14   Hope Orlene Och, NP   BP 144/69 mmHg  Pulse 98  Temp(Src) 98.7 F (37.1 C) (Oral)  Resp 18  Ht  (1.753 m)  Wt 250 lb (113.399 kg)  BMI 36.90 kg/m2  SpO2 99%  LMP 10/05/2014 Physical Exam  Constitutional: She is oriented to person, place, and time. She appears well-developed and well-nourished.  HENT:  Head: Normocephalic and atraumatic.  Eyes: Right eye exhibits no discharge. Left eye exhibits no discharge.  Neck: Neck supple. No tracheal deviation present.  Cardiovascular: Normal rate.   Pulmonary/Chest: Effort normal. No respiratory distress.  Abdominal: She exhibits no distension.  Musculoskeletal:  Pedal pulses 2+ bilateral. Equal strength with good plantar and dorsiflexion. Pain over the medial aspect of the right medial malleolus no increased  warmth. No redness. No signs of infection.   Neurological: She is alert and oriented to person, place, and time.  Skin: Skin is warm and dry.  Psychiatric: She has a normal mood and affect. Her behavior is normal.  Nursing note and vitals reviewed.   ED Course  Procedures   DIAGNOSTIC STUDIES: Oxygen Saturation is 99% on RA, normal by my interpretation.    COORDINATION OF CARE: 5:12 PM Discussed treatment plan with pt at bedside and pt agreed to plan. Will not re x-ray since no new injury.   MDM  39 y.o. female with right ankle pain. Stable for d/c without neurovascular compromise. Ace wrap applied, ice and NSAIDS. Patient to follow up with ortho.  Patient ambulatory without difficulty. She has crutches to use as needed for previous visit.   Final diagnoses:  Ankle pain, right   I personally performed the services described in this documentation, which was scribed in my presence. The recorded information has been reviewed and is accurate.      Petronila, NP 11/03/14 1736  Benjiman Core, MD 11/04/14 [phone]

## 2014-11-02 NOTE — ED Notes (Signed)
PT stated she was seen in the ED on 09/18/14 and dx with ankle sprain and stated it was feeling better and stated she starting having right ankle pain again with no new injury x7 days ago.

## 2014-11-29 ENCOUNTER — Emergency Department (HOSPITAL_COMMUNITY): Payer: Self-pay

## 2014-11-29 ENCOUNTER — Emergency Department (HOSPITAL_COMMUNITY)
Admission: EM | Admit: 2014-11-29 | Discharge: 2014-11-29 | Disposition: A | Discharge status: Home / Self Care | Payer: Self-pay | Attending: Emergency Medicine | Admitting: Emergency Medicine

## 2014-11-29 ENCOUNTER — Encounter (HOSPITAL_COMMUNITY): Payer: Self-pay | Admitting: Emergency Medicine

## 2014-11-29 DIAGNOSIS — G8929 Other chronic pain: Secondary | ICD-10-CM | POA: Insufficient documentation

## 2014-11-29 DIAGNOSIS — G43909 Migraine, unspecified, not intractable, without status migrainosus: Secondary | ICD-10-CM | POA: Insufficient documentation

## 2014-11-29 DIAGNOSIS — G43009 Migraine without aura, not intractable, without status migrainosus: Secondary | ICD-10-CM

## 2014-11-29 DIAGNOSIS — Z79899 Other long term (current) drug therapy: Secondary | ICD-10-CM | POA: Insufficient documentation

## 2014-11-29 DIAGNOSIS — F329 Major depressive disorder, single episode, unspecified: Secondary | ICD-10-CM | POA: Insufficient documentation

## 2014-11-29 DIAGNOSIS — E119 Type 2 diabetes mellitus without complications: Secondary | ICD-10-CM | POA: Insufficient documentation

## 2014-11-29 DIAGNOSIS — R002 Palpitations: Secondary | ICD-10-CM | POA: Insufficient documentation

## 2014-11-29 DIAGNOSIS — I10 Essential (primary) hypertension: Secondary | ICD-10-CM | POA: Insufficient documentation

## 2014-11-29 DIAGNOSIS — F419 Anxiety disorder, unspecified: Secondary | ICD-10-CM | POA: Insufficient documentation

## 2014-11-29 LAB — CBC WITH DIFFERENTIAL/PLATELET
Basophils Absolute: 0 10*3/uL (ref 0.0–0.1)
Basophils Relative: 0 % (ref 0–1)
Eosinophils Absolute: 0.2 10*3/uL (ref 0.0–0.7)
Eosinophils Relative: 2 % (ref 0–5)
HCT: 33.2 % — ABNORMAL LOW (ref 36.0–46.0)
Hemoglobin: 10.5 g/dL — ABNORMAL LOW (ref 12.0–15.0)
Lymphocytes Relative: 36 % (ref 12–46)
Lymphs Abs: 2.5 10*3/uL (ref 0.7–4.0)
MCH: 26 pg (ref 26.0–34.0)
MCHC: 31.6 g/dL (ref 30.0–36.0)
MCV: 82.2 fL (ref 78.0–100.0)
Monocytes Absolute: 0.4 10*3/uL (ref 0.1–1.0)
Monocytes Relative: 6 % (ref 3–12)
Neutro Abs: 3.9 10*3/uL (ref 1.7–7.7)
Neutrophils Relative %: 56 % (ref 43–77)
Platelets: 312 10*3/uL (ref 150–400)
RBC: 4.04 MIL/uL (ref 3.87–5.11)
RDW: 13.7 % (ref 11.5–15.5)
WBC: 6.9 10*3/uL (ref 4.0–10.5)

## 2014-11-29 LAB — BASIC METABOLIC PANEL
Anion gap: 11 (ref 5–15)
BUN: 16 mg/dL (ref 6–20)
CO2: 23 mmol/L (ref 22–32)
Calcium: 8.8 mg/dL — ABNORMAL LOW (ref 8.9–10.3)
Chloride: 106 mmol/L (ref 101–111)
Creatinine, Ser: 0.67 mg/dL (ref 0.44–1.00)
GFR calc Af Amer: >60 mL/min (ref >60)
GFR calc non Af Amer: >60 mL/min (ref >60)
Glucose, Bld: 193 mg/dL — ABNORMAL HIGH (ref 70–99)
Potassium: 3.3 mmol/L — ABNORMAL LOW (ref 3.5–5.1)
Sodium: 140 mmol/L (ref 135–145)

## 2014-11-29 MED ORDER — SODIUM CHLORIDE 0.9 % IV SOLN
Freq: Once | INTRAVENOUS | Status: AC
  Administered 2014-11-29: 19:00:00 via INTRAVENOUS

## 2014-11-29 MED ORDER — METOCLOPRAMIDE HCL 10 MG PO TABS: 10.0000 mg | ORAL_TABLET | Freq: Four times a day (QID) | ORAL | Status: DC | PRN

## 2014-11-29 MED ORDER — METOCLOPRAMIDE HCL 5 MG/ML IJ SOLN
10.0000 mg | Freq: Once | INTRAMUSCULAR | Status: AC
  Administered 2014-11-29: 10 mg via INTRAVENOUS
  Filled 2014-11-29: qty 2

## 2014-11-29 MED ORDER — KETOROLAC TROMETHAMINE 30 MG/ML IJ SOLN
30.0000 mg | Freq: Once | INTRAMUSCULAR | Status: AC
  Administered 2014-11-29: 30 mg via INTRAVENOUS
  Filled 2014-11-29: qty 1

## 2014-11-29 MED ORDER — DEXAMETHASONE SODIUM PHOSPHATE 10 MG/ML IJ SOLN
INTRAMUSCULAR | Status: AC
  Administered 2014-11-29: 10 mg
  Filled 2014-11-29: qty 1

## 2014-11-29 MED ORDER — DEXAMETHASONE SODIUM PHOSPHATE 4 MG/ML IJ SOLN
10.0000 mg | Freq: Once | INTRAMUSCULAR | Status: AC
  Administered 2014-11-29: 10 mg via INTRAVENOUS
  Filled 2014-11-29: qty 3

## 2014-11-29 MED ORDER — NAPROXEN 500 MG PO TABS: 500.0000 mg | ORAL_TABLET | Freq: Two times a day (BID) | ORAL | Status: DC

## 2014-11-29 NOTE — ED Notes (Addendum)
Patient c/o sudden headache with blurred vision in right eye. Patient also c/o dizziness (room spinning) and palpitations that started today at 4pm. Denies any slurred speech or weakness but reports difficulty with concentration. Patient reports hx of migraine headache but states "they usual come with out warning but I've never had the dizziness before."

## 2014-11-29 NOTE — ED Notes (Signed)
Pt reporting minimal improvement of headache.

## 2014-11-29 NOTE — ED Provider Notes (Signed)
CSN: [id number]     Arrival date & time 11/29/14  1756 History   First MD Initiated Contact with Patient 11/29/14 1817     Chief Complaint  Patient presents with  . Dizziness  . Palpitations     (Consider location/radiation/quality/duration/timing/severity/associated sxs/prior Treatment) HPI Comments: 39 year old female with history of migraines, smoker, diabetes, anxiety, blood pressure, chronic back pain presents with headache since 2 hours prior to arrival. This is similar to her previous migraines however she has additional lightheadedness/dizziness and palpitations for which are new for her. She said normally her migraines are more said in onset within 5 minutes. No history of aneurysms or bleeding. No focal neuro deficits. No stroke history. No balance issues, mild blurry vision right eye, mild photophobia.  Patient is a 39 y.o. female presenting with dizziness and palpitations. The history is provided by the patient.  Dizziness Associated symptoms: palpitations   Associated symptoms: no chest pain, no headaches, no shortness of breath, no vomiting and no weakness   Palpitations Associated symptoms: dizziness   Associated symptoms: no back pain, no chest pain, no numbness, no shortness of breath, no vomiting and no weakness     Past Medical History  Diagnosis Date  . Diabetes mellitus   . Depression   . Anxiety   . Hypertension   . Migraine   . High cholesterol   . Plantar fasciitis   . Neuropathic pain of foot   . Chronic back pain   . Sciatica    Past Surgical History  Procedure Laterality Date  . Back surgery    . Eye surgery    . Tubal ligation     Family History  Problem Relation Age of Onset  . Heart failure Mother   . COPD Mother   . Diabetes Mother   . Heart failure Father   . Diabetes Other    History  Substance Use Topics  . Smoking status: Current Every Day Smoker -- 1.00 packs/day for 20 years    Types: Cigarettes  . Smokeless tobacco: Never Used   . Alcohol Use: No     Comment: occ   OB History    Gravida Para Term Preterm AB TAB SAB Ectopic Multiple Living   Review of Systems  Constitutional: Negative for fever and chills.  HENT: Negative for congestion.   Eyes: Positive for photophobia and visual disturbance.  Respiratory: Negative for shortness of breath.   Cardiovascular: Positive for palpitations. Negative for chest pain.  Gastrointestinal: Negative for vomiting and abdominal pain.  Genitourinary: Negative for dysuria and flank pain.  Musculoskeletal: Negative for back pain, neck pain and neck stiffness.  Skin: Negative for rash.  Neurological: Positive for dizziness. Negative for weakness, light-headedness, numbness and headaches.      Allergies  Codeine; Benadryl; and Compazine  Home Medications   Prior to Admission medications   Medication Sig Start Date End Date Taking? Authorizing Provider  naproxen sodium (ANAPROX) 220 MG tablet Take 440 mg by mouth 2 (two) times daily as needed (Pain).   Yes Historical Provider, MD  albuterol (PROVENTIL HFA;VENTOLIN HFA) 108 (90 BASE) MCG/ACT inhaler Inhale 2 puffs into the lungs every 6 (six) hours as needed for wheezing or shortness of breath. 07/23/14   Ivery Quale, PA-C  benzonatate (TESSALON) 100 MG capsule Take 1 capsule (100 mg total) by mouth 3 (three) times daily as needed for cough. Patient not taking: Reported on 07/22/2014  05/22/14   Raeford RazorStephen Kohut, MD  diclofenac (VOLTAREN) 50 MG EC tablet Take 1 tablet (50 mg total) by mouth 2 (two) times daily. Patient not taking: Reported on 11/29/2014 11/02/14   Janne NapoleonHope M Neese, NP  metoCLOPramide (REGLAN) 10 MG tablet Take 1 tablet (10 mg total) by mouth every 6 (six) hours as needed for nausea (nausea/headache). 11/29/14   Blane OharaJoshua Eleonor Ocon, MD  naproxen (NAPROSYN) 500 MG tablet Take 1 tablet (500 mg total) by mouth 2 (two) times daily. 11/29/14   Blane OharaJoshua Deyvi Bonanno, MD   BP 134/66 mmHg  Pulse 85  Temp(Src) 98.9 F  (37.2 C) (Oral)  Resp 18  Ht 5\' 9"  (1.753 m)  Wt 260 lb (117.935 kg)  BMI 38.38 kg/m2  SpO2 98%  LMP 11/08/2014 Physical Exam  Constitutional: She is oriented to person, place, and time. She appears well-developed and well-nourished.  HENT:  Head: Normocephalic and atraumatic.  Eyes: Conjunctivae are normal. Right eye exhibits no discharge. Left eye exhibits no discharge.  Neck: Normal range of motion. Neck supple. No tracheal deviation present.  Cardiovascular: Normal rate and regular rhythm.   Pulmonary/Chest: Effort normal and breath sounds normal.  Abdominal: Soft. She exhibits no distension. There is no tenderness. There is no guarding.  Musculoskeletal: She exhibits no edema.  Neurological: She is alert and oriented to person, place, and time.  5+ strength in UE and LE with f/e at major joints. Sensation to palpation intact in UE and LE. CNs 2-12 grossly intact.  EOMFI.  PERRL.   Finger nose and coordination intact bilateral.   Visual fields intact to finger testing. No nystagmus  Skin: Skin is warm. No rash noted.  Psychiatric: She has a normal mood and affect.  Nursing note and vitals reviewed.   ED Course  Procedures (including critical care time) Labs Review Labs Reviewed  CBC WITH DIFFERENTIAL/PLATELET - Abnormal; Notable for the following:    Hemoglobin 10.5 (*)    HCT 33.2 (*)    All other components within normal limits  BASIC METABOLIC PANEL - Abnormal; Notable for the following:    Potassium 3.3 (*)    Glucose, Bld 193 (*)    Calcium 8.8 (*)    All other components within normal limits    Imaging Review Ct Head Wo Contrast  11/29/2014   CLINICAL DATA:  Migraine headache.  Dizziness.  EXAM: CT HEAD WITHOUT CONTRAST  TECHNIQUE: Contiguous axial images were obtained from the base of the skull through the vertex without intravenous contrast.  COMPARISON:  02/22/2012  FINDINGS: The brainstem, cerebellum, cerebral peduncles, thalamus, basal ganglia, basilar  cisterns, and ventricular system appear within normal limits. No intracranial hemorrhage, mass lesion, or acute CVA.  Nasal mucosal swelling with left-sided concha bullosa. Right frontal chronic sinusitis.  IMPRESSION: 1. Right frontal chronic sinusitis. 2. No significant intracranial abnormality observed.   Electronically Signed   By: Gaylyn RongWalter  Liebkemann M.D.   On: 11/29/2014 18:33     EKG Interpretation None      MDM   Final diagnoses:  Nonintractable migraine, unspecified migraine type    Patient presents with migraine-like headache similar previous however patient has new dizziness/vertigo that she has not had the past. Neurologic exam normal, no encephalopathy. CT head reviewed no acute bleeding. CT head was done within 3 hours of onset of symptoms and signs, no indication for lumbar puncture at this time. Migraine cocktail given, IV fluids. EKG reviewed no acute findings.  Pt improved on recheck, pt comfortable going home.  CT  head no acute findings.  Results and differential diagnosis were discussed with the patient/parent/guardian. Close follow up outpatient was discussed, comfortable with the plan.   Medications  0.9 %  sodium chloride infusion ( Intravenous New Bag/Given 11/29/14 1849)  metoCLOPramide (REGLAN) injection 10 mg (10 mg Intravenous Given 11/29/14 1846)  dexamethasone (DECADRON) injection 10 mg (10 mg Intravenous Given 11/29/14 1845)  dexamethasone (DECADRON) 10 MG/ML injection (10 mg  Given 11/29/14 1846)  ketorolac (TORADOL) 30 MG/ML injection 30 mg (30 mg Intravenous Given 11/29/14 1909)    Filed Vitals:   11/29/14 1807 11/29/14 1847  BP: 134/83 134/66  Pulse: 83 85  Temp: 99.3 F (37.4 C) 98.9 F (37.2 C)  TempSrc: Oral Oral  Resp: 18 18  Height: 5\' 9"  (1.753 m)   Weight: 260 lb (117.935 kg)   SpO2: 99% 98%    Final diagnoses:  Nonintractable migraine, unspecified migraine type       Blane OharaJoshua Pietra Zuluaga, MD 11/29/14 2059

## 2014-11-29 NOTE — ED Notes (Signed)
Pt ambulated to BR without assistance.  Continues to report headache.  Reinforced with pt that medications haven't had time to be effective.

## 2014-11-29 NOTE — ED Notes (Signed)
Patient assessed by Dr Jodi MourningZavitz at 18:08. No code stroke to be called.

## 2014-11-29 NOTE — Discharge Instructions (Signed)
If you were given medicines take as directed.  If you are on coumadin or contraceptives realize their levels and effectiveness is altered by many different medicines.  If you have any reaction (rash, tongues swelling, other) to the medicines stop taking and see a physician.   Please follow up as directed and return to the ER or see a physician for new or worsening symptoms.  Thank you. Filed Vitals:   11/29/14 1807 11/29/14 1847  BP: 134/83 134/66  Pulse: 83 85  Temp: 99.3 F (37.4 C) 98.9 F (37.2 C)  TempSrc: Oral Oral  Resp: 18 18  Height: 5\' 9"  (1.753 m)   Weight: 260 lb (117.935 kg)   SpO2: 99% 98%

## 2015-04-06 ENCOUNTER — Encounter: Payer: Self-pay | Admitting: Obstetrics & Gynecology

## 2015-04-06 ENCOUNTER — Encounter: Payer: Self-pay | Admitting: *Deleted

## 2015-04-19 ENCOUNTER — Emergency Department (HOSPITAL_COMMUNITY)
Admission: EM | Admit: 2015-04-19 | Discharge: 2015-04-19 | Disposition: A | Discharge status: Home / Self Care | Payer: BLUE CROSS/BLUE SHIELD | Attending: Emergency Medicine | Admitting: Emergency Medicine

## 2015-04-19 ENCOUNTER — Encounter (HOSPITAL_COMMUNITY): Payer: Self-pay | Admitting: Emergency Medicine

## 2015-04-19 DIAGNOSIS — F419 Anxiety disorder, unspecified: Secondary | ICD-10-CM | POA: Insufficient documentation

## 2015-04-19 DIAGNOSIS — Z791 Long term (current) use of non-steroidal anti-inflammatories (NSAID): Secondary | ICD-10-CM | POA: Insufficient documentation

## 2015-04-19 DIAGNOSIS — G8929 Other chronic pain: Secondary | ICD-10-CM | POA: Diagnosis not present

## 2015-04-19 DIAGNOSIS — I10 Essential (primary) hypertension: Secondary | ICD-10-CM | POA: Insufficient documentation

## 2015-04-19 DIAGNOSIS — R55 Syncope and collapse: Secondary | ICD-10-CM | POA: Diagnosis present

## 2015-04-19 DIAGNOSIS — Z3202 Encounter for pregnancy test, result negative: Secondary | ICD-10-CM | POA: Diagnosis not present

## 2015-04-19 DIAGNOSIS — Z720 Tobacco use: Secondary | ICD-10-CM | POA: Insufficient documentation

## 2015-04-19 DIAGNOSIS — Z79899 Other long term (current) drug therapy: Secondary | ICD-10-CM | POA: Insufficient documentation

## 2015-04-19 DIAGNOSIS — Z8739 Personal history of other diseases of the musculoskeletal system and connective tissue: Secondary | ICD-10-CM | POA: Diagnosis not present

## 2015-04-19 DIAGNOSIS — R42 Dizziness and giddiness: Secondary | ICD-10-CM

## 2015-04-19 DIAGNOSIS — E119 Type 2 diabetes mellitus without complications: Secondary | ICD-10-CM | POA: Diagnosis not present

## 2015-04-19 LAB — CBC
HCT: 34.9 % — ABNORMAL LOW (ref 36.0–46.0)
Hemoglobin: 10.7 g/dL — ABNORMAL LOW (ref 12.0–15.0)
MCH: 24.7 pg — ABNORMAL LOW (ref 26.0–34.0)
MCHC: 30.7 g/dL (ref 30.0–36.0)
MCV: 80.4 fL (ref 78.0–100.0)
Platelets: 408 10*3/uL — ABNORMAL HIGH (ref 150–400)
RBC: 4.34 MIL/uL (ref 3.87–5.11)
RDW: 15.5 % (ref 11.5–15.5)
WBC: 7.4 10*3/uL (ref 4.0–10.5)

## 2015-04-19 LAB — URINE MICROSCOPIC-ADD ON

## 2015-04-19 LAB — URINALYSIS, ROUTINE W REFLEX MICROSCOPIC
Bilirubin Urine: NEGATIVE
Glucose, UA: NEGATIVE mg/dL
Ketones, ur: NEGATIVE mg/dL
Nitrite: NEGATIVE
Protein, ur: NEGATIVE mg/dL
Specific Gravity, Urine: 1.01 (ref 1.005–1.030)
Urobilinogen, UA: 0.2 mg/dL (ref 0.0–1.0)
pH: 6.5 (ref 5.0–8.0)

## 2015-04-19 LAB — BASIC METABOLIC PANEL
Anion gap: 10 (ref 5–15)
BUN: 11 mg/dL (ref 6–20)
CO2: 24 mmol/L (ref 22–32)
Calcium: 8.8 mg/dL — ABNORMAL LOW (ref 8.9–10.3)
Chloride: 102 mmol/L (ref 101–111)
Creatinine, Ser: 0.53 mg/dL (ref 0.44–1.00)
GFR calc Af Amer: >60 mL/min (ref >60)
GFR calc non Af Amer: >60 mL/min (ref >60)
Glucose, Bld: 212 mg/dL — ABNORMAL HIGH (ref 65–99)
Potassium: 3.7 mmol/L (ref 3.5–5.1)
Sodium: 136 mmol/L (ref 135–145)

## 2015-04-19 LAB — PREGNANCY, URINE: Preg Test, Ur: NEGATIVE

## 2015-04-19 MED ORDER — SODIUM CHLORIDE 0.9 % IV BOLUS (SEPSIS)
1000.0000 mL | Freq: Once | INTRAVENOUS | Status: AC
  Administered 2015-04-19: 1000 mL via INTRAVENOUS

## 2015-04-19 MED ORDER — LORAZEPAM 1 MG PO TABS: 1.0000 mg | ORAL_TABLET | Freq: Four times a day (QID) | ORAL | Status: DC | PRN

## 2015-04-19 MED ORDER — LORAZEPAM 2 MG/ML IJ SOLN
1.0000 mg | Freq: Once | INTRAMUSCULAR | Status: AC
  Administered 2015-04-19: 1 mg via INTRAVENOUS
  Filled 2015-04-19: qty 1

## 2015-04-19 NOTE — ED Notes (Signed)
Patient states she still has a headache at this time. RN made aware.

## 2015-04-19 NOTE — ED Notes (Signed)
PT stated she starting feeling dizzy and had near syncopal episode at work this morning around 0800 and has felt generalized weakness with headache since and continuously dizziness worse with movement.

## 2015-04-19 NOTE — ED Notes (Signed)
Patient states she would like something for a headache at this time.

## 2015-04-19 NOTE — Discharge Instructions (Signed)
Drink plenty of fluids.  Use Tylenol or Motrin for pain.  Dizziness Dizziness is a common problem. It is a feeling of unsteadiness or light-headedness. You may feel like you are about to faint. Dizziness can lead to injury if you stumble or fall. A person of any age group can suffer from dizziness, but dizziness is more common in older adults. CAUSES  Dizziness can be caused by many different things, including:  Middle ear problems.  Standing for too long.  Infections.  An allergic reaction.  Aging.  An emotional response to something, such as the sight of blood.  Side effects of medicines.  Tiredness.  Problems with circulation or blood pressure.  Excessive use of alcohol or medicines, or illegal drug use.  Breathing too fast (hyperventilation).  An irregular heart rhythm (arrhythmia).  A low red blood cell count (anemia).  Pregnancy.  Vomiting, diarrhea, fever, or other illnesses that cause body fluid loss (dehydration).  Diseases or conditions such as Parkinson's disease, high blood pressure (hypertension), diabetes, and thyroid problems.  Exposure to extreme heat. DIAGNOSIS  Your health care provider will ask about your symptoms, perform a physical exam, and perform an electrocardiogram (ECG) to record the electrical activity of your heart. Your health care provider may also perform other heart or blood tests to determine the cause of your dizziness. These may include:  Transthoracic echocardiogram (TTE). During echocardiography, sound waves are used to evaluate how blood flows through your heart.  Transesophageal echocardiogram (TEE).  Cardiac monitoring. This allows your health care provider to monitor your heart rate and rhythm in real time.  Holter monitor. This is a portable device that records your heartbeat and can help diagnose heart arrhythmias. It allows your health care provider to track your heart activity for several days if needed.  Stress tests  by exercise or by giving medicine that makes the heart beat faster. TREATMENT  Treatment of dizziness depends on the cause of your symptoms and can vary greatly. HOME CARE INSTRUCTIONS   Drink enough fluids to keep your urine clear or pale yellow. This is especially important in very hot weather. In older adults, it is also important in cold weather.  Take your medicine exactly as directed if your dizziness is caused by medicines. When taking blood pressure medicines, it is especially important to get up slowly.  Rise slowly from chairs and steady yourself until you feel okay.  In the morning, first sit up on the side of the bed. When you feel okay, stand slowly while holding onto something until you know your balance is fine.  Move your legs often if you need to stand in one place for a long time. Tighten and relax your muscles in your legs while standing.  Have someone stay with you for 1-2 days if dizziness continues to be a problem. Do this until you feel you are well enough to stay alone. Have the person call your health care provider if he or she notices changes in you that are concerning.  Do not drive or use heavy machinery if you feel dizzy.  Do not drink alcohol. SEEK IMMEDIATE MEDICAL CARE IF:   Your dizziness or light-headedness gets worse.  You feel nauseous or vomit.  You have problems talking, walking, or using your arms, hands, or legs.  You feel weak.  You are not thinking clearly or you have trouble forming sentences. It may take a friend or family member to notice this.  You have chest pain, abdominal pain,  shortness of breath, or sweating.  Your vision changes.  You notice any bleeding.  You have side effects from medicine that seems to be getting worse rather than better. MAKE SURE YOU:   Understand these instructions.  Will watch your condition.  Will get help right away if you are not doing well or get worse. Document Released: 01/03/2001 Document  Revised: 07/15/2013 Document Reviewed: 01/27/2011 [facility] Patient Information 2015 [place], [place]. This information is not intended to replace advice given to you by your health care provider. Make sure you discuss any questions you have with your health care provider.

## 2015-04-19 NOTE — ED Provider Notes (Signed)
CSN: [id number]     Arrival date & time 04/19/15  1412 History   First MD Initiated Contact with Patient 04/19/15 1552     Chief Complaint  Patient presents with  . Near Syncope     (Consider location/radiation/quality/duration/timing/severity/associated sxs/prior Treatment) HPI   Tina Pham is a 39 y.o. female who presents for evaluation of episodes of dizziness and weakness. These episodes started earlier today, while at work. They have persisted. She describes the dizziness as a spinning sensation, which leads her to sit down, and it was she would fall. She denies headaches, nausea, vomiting, fever, chills, cough, shortness of breath or chest pain. She has noticed some urinary frequency, without dysuria recently. She is using her usual medications. She states that she stopped taking medications for diabetes a couple of years ago, because she felt better and lost weight. She does not currently have a doctor. There are no other known modifying factors.   Past Medical History  Diagnosis Date  . Diabetes mellitus   . Depression   . Anxiety   . Hypertension   . Migraine   . High cholesterol   . Plantar fasciitis   . Neuropathic pain of foot   . Chronic back pain   . Sciatica    Past Surgical History  Procedure Laterality Date  . Back surgery    . Eye surgery    . Tubal ligation     Family History  Problem Relation Age of Onset  . Heart failure Mother   . COPD Mother   . Diabetes Mother   . Heart failure Father   . Diabetes Other    Social History  Substance Use Topics  . Smoking status: Current Every Day Smoker -- 1.00 packs/day for 20 years    Types: Cigarettes  . Smokeless tobacco: Never Used  . Alcohol Use: No     Comment: occ   OB History    Gravida Para Term Preterm AB TAB SAB Ectopic Multiple Living   Review of Systems  All other systems reviewed and are negative.     Allergies  Codeine; Benadryl; and Compazine  Home  Medications   Prior to Admission medications   Medication Sig Start Date End Date Taking? Authorizing Provider  albuterol (PROVENTIL HFA;VENTOLIN HFA) 108 (90 BASE) MCG/ACT inhaler Inhale 2 puffs into the lungs every 6 (six) hours as needed for wheezing or shortness of breath. 07/23/14  Yes Ivery Quale, PA-C  HYDROcodone-acetaminophen (NORCO/VICODIN) 5-325 MG per tablet Take 1 tablet by mouth every 4 (four) hours as needed. For pain 04/05/15  Yes Historical Provider, MD  naproxen (NAPROSYN) 500 MG tablet Take 1 tablet (500 mg total) by mouth 2 (two) times daily. 11/29/14  Yes Blane Ohara, MD  LORazepam (ATIVAN) 1 MG tablet Take 1 tablet (1 mg total) by mouth every 6 (six) hours as needed (Dizziness). 04/19/15   Mancel Bale, MD   BP 131/81 mmHg  Pulse 89  Temp(Src) 98.2 F (36.8 C) (Oral)  Resp 18  Ht  (1.753 m)  Wt 250 lb (113.399 kg)  BMI 36.90 kg/m2  SpO2 98%  LMP 04/13/2015 Physical Exam  Constitutional: She is oriented to person, place, and time. She appears well-developed and well-nourished.  HENT:  Head: Normocephalic and atraumatic.  Right Ear: External ear normal.  Left Ear: External ear normal.  Eyes: Conjunctivae and EOM are normal. Pupils are equal, round, and  reactive to light.  Neck: Normal range of motion and phonation normal. Neck supple.  Cardiovascular: Normal rate, regular rhythm and normal heart sounds.   Pulmonary/Chest: Effort normal and breath sounds normal. She exhibits no bony tenderness.  Abdominal: Soft. There is no tenderness.  Musculoskeletal: Normal range of motion.  Neurological: She is alert and oriented to person, place, and time. No cranial nerve deficit or sensory deficit. She exhibits normal muscle tone. Coordination normal.  No dysarthria, aphasia or nystagmus  Skin: Skin is warm, dry and intact.  Psychiatric: She has a normal mood and affect. Her behavior is normal. Judgment and thought content normal.  Nursing note and vitals  reviewed.   ED Course  Procedures (including critical care time) Medications  sodium chloride 0.9 % bolus 1,000 mL (0 mLs Intravenous Stopped 04/19/15 1719)  LORazepam (ATIVAN) injection 1 mg (1 mg Intravenous Given 04/19/15 1616)    Patient Vitals for the past 24 hrs:  BP Pulse Resp SpO2  04/19/15 2100 131/81 mmHg 89 - 98 %  04/19/15 2054 140/100 mmHg 93 18 100 %  04/19/15 2045 - 91 - 98 %  04/19/15 2030 140/100 mmHg 88 - 98 %  04/19/15 2015 - 83 - 99 %  04/19/15 2000 127/75 mmHg 93 - 98 %  04/19/15 1945 - 89 - 99 %  04/19/15 1930 142/91 mmHg 84 - 99 %  04/19/15 1915 - 90 - 98 %  04/19/15 1900 (!) 166/145 mmHg 114 - 100 %  04/19/15 1845 - 90 - 97 %  04/19/15 1830 151/94 mmHg 87 - 99 %    8:53 PM Reevaluation with update and discussion. After initial assessment and treatment, an updated evaluation reveals she is sleeping, arouses easily and states that she does not have vertigo at this time. Findings discussed with the patient, all questions were answered. Haleema Vanderheyden L    Labs Review Labs Reviewed  BASIC METABOLIC PANEL - Abnormal; Notable for the following:    Glucose, Bld 212 (*)    Calcium 8.8 (*)    All other components within normal limits  CBC - Abnormal; Notable for the following:    Hemoglobin 10.7 (*)    HCT 34.9 (*)    MCH 24.7 (*)    Platelets 408 (*)    All other components within normal limits  URINALYSIS, ROUTINE W REFLEX MICROSCOPIC (NOT AT [facility]) - Abnormal; Notable for the following:    Color, Urine STRAW (*)    Hgb urine dipstick SMALL (*)    Leukocytes, UA TRACE (*)    All other components within normal limits  URINE MICROSCOPIC-ADD ON - Abnormal; Notable for the following:    Squamous Epithelial / LPF FEW (*)    All other components within normal limits  URINE CULTURE  PREGNANCY, URINE    Imaging Review No results found. I have personally reviewed and evaluated these images and lab results as part of my medical decision-making.   EKG  Interpretation None      MDM   Final diagnoses:  Vertigo    Vertigo, likely peripheral. Doubt CVA, metabolic instability or SBI/  Nursing Notes Reviewed/ Care Coordinated Applicable Imaging Reviewed Interpretation of Laboratory Data incorporated into ED treatment  The patient appears reasonably screened and/or stabilized for discharge and I doubt any other medical condition or other [facility] requiring further screening, evaluation, or treatment in the ED at this time prior to discharge.  Plan: Home Medications- Ativan; Home Treatments- rest; return here if the recommended treatment, does  not improve the symptoms; Recommended follow up- PCP prn     Mancel Bale, MD 04/20/15 [phone]

## 2015-04-19 NOTE — ED Notes (Signed)
Pt ambulated in hall to bathroom without any assistance. Tolerated well.

## 2015-04-21 LAB — URINE CULTURE: Culture: NO GROWTH

## 2015-07-28 ENCOUNTER — Emergency Department (HOSPITAL_COMMUNITY)
Admission: EM | Admit: 2015-07-28 | Discharge: 2015-07-28 | Disposition: A | Discharge status: Home / Self Care | Payer: BLUE CROSS/BLUE SHIELD | Attending: Emergency Medicine | Admitting: Emergency Medicine

## 2015-07-28 ENCOUNTER — Encounter (HOSPITAL_COMMUNITY): Payer: Self-pay | Admitting: *Deleted

## 2015-07-28 DIAGNOSIS — F17210 Nicotine dependence, cigarettes, uncomplicated: Secondary | ICD-10-CM | POA: Diagnosis not present

## 2015-07-28 DIAGNOSIS — M5430 Sciatica, unspecified side: Secondary | ICD-10-CM | POA: Diagnosis not present

## 2015-07-28 DIAGNOSIS — N939 Abnormal uterine and vaginal bleeding, unspecified: Secondary | ICD-10-CM | POA: Insufficient documentation

## 2015-07-28 DIAGNOSIS — Z791 Long term (current) use of non-steroidal anti-inflammatories (NSAID): Secondary | ICD-10-CM | POA: Diagnosis not present

## 2015-07-28 DIAGNOSIS — I10 Essential (primary) hypertension: Secondary | ICD-10-CM | POA: Diagnosis not present

## 2015-07-28 DIAGNOSIS — E119 Type 2 diabetes mellitus without complications: Secondary | ICD-10-CM | POA: Diagnosis not present

## 2015-07-28 DIAGNOSIS — F419 Anxiety disorder, unspecified: Secondary | ICD-10-CM | POA: Insufficient documentation

## 2015-07-28 DIAGNOSIS — Z79899 Other long term (current) drug therapy: Secondary | ICD-10-CM | POA: Diagnosis not present

## 2015-07-28 DIAGNOSIS — G8929 Other chronic pain: Secondary | ICD-10-CM | POA: Diagnosis not present

## 2015-07-28 LAB — BASIC METABOLIC PANEL
Anion gap: 10 (ref 5–15)
BUN: 14 mg/dL (ref 6–20)
CO2: 24 mmol/L (ref 22–32)
Calcium: 9.2 mg/dL (ref 8.9–10.3)
Chloride: 106 mmol/L (ref 101–111)
Creatinine, Ser: 0.57 mg/dL (ref 0.44–1.00)
GFR calc Af Amer: >60 mL/min (ref >60)
GFR calc non Af Amer: >60 mL/min (ref >60)
Glucose, Bld: 143 mg/dL — ABNORMAL HIGH (ref 65–99)
Potassium: 4.3 mmol/L (ref 3.5–5.1)
Sodium: 140 mmol/L (ref 135–145)

## 2015-07-28 LAB — CBC WITH DIFFERENTIAL/PLATELET
Basophils Absolute: 0 10*3/uL (ref 0.0–0.1)
Basophils Relative: 1 %
Eosinophils Absolute: 0.1 10*3/uL (ref 0.0–0.7)
Eosinophils Relative: 1 %
HCT: 33.3 % — ABNORMAL LOW (ref 36.0–46.0)
Hemoglobin: 10.3 g/dL — ABNORMAL LOW (ref 12.0–15.0)
Lymphocytes Relative: 17 %
Lymphs Abs: 1 10*3/uL (ref 0.7–4.0)
MCH: 24.4 pg — ABNORMAL LOW (ref 26.0–34.0)
MCHC: 30.9 g/dL (ref 30.0–36.0)
MCV: 78.9 fL (ref 78.0–100.0)
Monocytes Absolute: 0.5 10*3/uL (ref 0.1–1.0)
Monocytes Relative: 8 %
Neutro Abs: 4.4 10*3/uL (ref 1.7–7.7)
Neutrophils Relative %: 73 %
Platelets: 341 10*3/uL (ref 150–400)
RBC: 4.22 MIL/uL (ref 3.87–5.11)
RDW: 14.6 % (ref 11.5–15.5)
WBC: 6 10*3/uL (ref 4.0–10.5)

## 2015-07-28 LAB — HCG, QUANTITATIVE, PREGNANCY: hCG, Beta Chain, Quant, S: <1 m[IU]/mL (ref <5)

## 2015-07-28 MED ORDER — SODIUM CHLORIDE 0.9 % IV BOLUS (SEPSIS)
1000.0000 mL | Freq: Once | INTRAVENOUS | Status: AC
  Administered 2015-07-28: 1000 mL via INTRAVENOUS

## 2015-07-28 MED ORDER — KETOROLAC TROMETHAMINE 30 MG/ML IJ SOLN
30.0000 mg | Freq: Once | INTRAMUSCULAR | Status: AC
  Administered 2015-07-28: 30 mg via INTRAVENOUS
  Filled 2015-07-28: qty 1

## 2015-07-28 MED ORDER — HYDROCODONE-ACETAMINOPHEN 5-325 MG PO TABS: 1.0000 | ORAL_TABLET | Freq: Four times a day (QID) | ORAL | Status: DC | PRN

## 2015-07-28 MED ORDER — LORAZEPAM 2 MG/ML IJ SOLN
0.5000 mg | Freq: Once | INTRAMUSCULAR | Status: AC
  Administered 2015-07-28: 0.5 mg via INTRAVENOUS
  Filled 2015-07-28: qty 1

## 2015-07-28 NOTE — ED Provider Notes (Signed)
CSN: [id number]     Arrival date & time 07/28/15  1458 History   First MD Initiated Contact with Patient 07/28/15 1819     Chief Complaint  Patient presents with  . Abdominal Cramping  . Vaginal Bleeding     (Consider location/radiation/quality/duration/timing/severity/associated sxs/prior Treatment) Patient is a 40 y.o. female presenting with vaginal bleeding. The history is provided by the patient (Patient complains of moderate vaginal bleeding for a PE days. Moderate abdominal cramps).  Vaginal Bleeding Quality:  Clots and dark red Severity:  Moderate Onset quality:  Sudden Timing:  Intermittent Progression:  Waxing and waning Chronicity:  New Menstrual history:  Regular Associated symptoms: no abdominal pain, no back pain and no fatigue     Past Medical History  Diagnosis Date  . Diabetes mellitus   . Depression   . Anxiety   . Hypertension   . Migraine   . High cholesterol   . Plantar fasciitis   . Neuropathic pain of foot   . Chronic back pain   . Sciatica    Past Surgical History  Procedure Laterality Date  . Back surgery    . Eye surgery    . Tubal ligation     Family History  Problem Relation Age of Onset  . Heart failure Mother   . COPD Mother   . Diabetes Mother   . Heart failure Father   . Diabetes Other    Social History  Substance Use Topics  . Smoking status: Current Every Day Smoker -- 1.00 packs/day for 20 years    Types: Cigarettes  . Smokeless tobacco: Never Used  . Alcohol Use: No     Comment: occ   OB History    Gravida Para Term Preterm AB TAB SAB Ectopic Multiple Living   3 3 3       3      Review of Systems  Constitutional: Negative for appetite change and fatigue.  HENT: Negative for congestion, ear discharge and sinus pressure.   Eyes: Negative for discharge.  Respiratory: Negative for cough.   Cardiovascular: Negative for chest pain.  Gastrointestinal: Negative for abdominal pain and diarrhea.  Genitourinary: Positive for  vaginal bleeding. Negative for frequency and hematuria.  Musculoskeletal: Negative for back pain.  Skin: Negative for rash.  Neurological: Negative for seizures and headaches.  Psychiatric/Behavioral: Negative for hallucinations.      Allergies  Codeine; Benadryl; and Compazine  Home Medications   Prior to Admission medications   Medication Sig Start Date End Date Taking? Authorizing Provider  albuterol (PROVENTIL HFA;VENTOLIN HFA) 108 (90 BASE) MCG/ACT inhaler Inhale 2 puffs into the lungs every 6 (six) hours as needed for wheezing or shortness of breath. 07/23/14   Ivery QualeHobson Bryant, PA-C  HYDROcodone-acetaminophen (NORCO/VICODIN) 5-325 MG per tablet Take 1 tablet by mouth every 4 (four) hours as needed. For pain 04/05/15   Historical Provider, MD  LORazepam (ATIVAN) 1 MG tablet Take 1 tablet (1 mg total) by mouth every 6 (six) hours as needed (Dizziness). 04/19/15   Mancel BaleElliott Wentz, MD  naproxen (NAPROSYN) 500 MG tablet Take 1 tablet (500 mg total) by mouth 2 (two) times daily. 11/29/14   Blane OharaJoshua Zavitz, MD   BP 111/52 mmHg  Pulse 97  Temp(Src) 98.2 F (36.8 C) (Oral)  Resp 18  Ht 5\' 8"  (1.727 m)  Wt 238 lb (107.956 kg)  BMI 36.20 kg/m2  SpO2 98%  LMP 07/23/2015 Physical Exam  Constitutional: She is oriented to person, place, and time. She appears well-developed.  HENT:  Head: Normocephalic.  Eyes: Conjunctivae and EOM are normal. No scleral icterus.  Neck: Neck supple. No thyromegaly present.  Cardiovascular: Normal rate and regular rhythm.  Exam reveals no gallop and no friction rub.   No murmur heard. Pulmonary/Chest: No stridor. She has no wheezes. She has no rales. She exhibits no tenderness.  Abdominal: She exhibits no distension. There is tenderness. There is no rebound.  Mild suprapubic tenderness  Genitourinary:  Mild amount blood in vagina.  Musculoskeletal: Normal range of motion. She exhibits no edema.  Lymphadenopathy:    She has no cervical adenopathy.   Neurological: She is oriented to person, place, and time. She exhibits normal muscle tone. Coordination normal.  Skin: No rash noted. No erythema.  Psychiatric: She has a normal mood and affect. Her behavior is normal.    ED Course  Procedures (including critical care time) Labs Review Labs Reviewed  CBC WITH DIFFERENTIAL/PLATELET - Abnormal; Notable for the following:    Hemoglobin 10.3 (*)    HCT 33.3 (*)    MCH 24.4 (*)    All other components within normal limits  BASIC METABOLIC PANEL - Abnormal; Notable for the following:    Glucose, Bld 143 (*)    All other components within normal limits  HCG, QUANTITATIVE, PREGNANCY    Imaging Review No results found. I have personally reviewed and evaluated these images and lab results as part of my medical decision-making.   EKG Interpretation None      MDM   Final diagnoses:  None    Irregular menses. Patient referred to OB/GYN doctor. She is given Vicodin for discomfort. She will follow-up in a week or return but the ER if things get worse    Bethann Berkshire, MD 07/28/15 [phone]

## 2015-07-28 NOTE — Discharge Instructions (Signed)
Follow up with dr. Emelda FearFerguson next week.  Return if problems before then

## 2015-07-28 NOTE — ED Notes (Addendum)
Pt she had her normal period the 2nd week of December and started again last Friday. Pt states her bleeding and abdominal cramping has gotten progressively worse. Pt states she is having clots of blood from her vagina.   Pt took hydrocodone for pain relief around 1300 today.

## 2015-08-16 ENCOUNTER — Emergency Department (HOSPITAL_COMMUNITY)
Admission: EM | Admit: 2015-08-16 | Discharge: 2015-08-16 | Disposition: A | Discharge status: Home / Self Care | Payer: BLUE CROSS/BLUE SHIELD | Attending: Emergency Medicine | Admitting: Emergency Medicine

## 2015-08-16 ENCOUNTER — Encounter (HOSPITAL_COMMUNITY): Payer: Self-pay | Admitting: *Deleted

## 2015-08-16 DIAGNOSIS — Z8739 Personal history of other diseases of the musculoskeletal system and connective tissue: Secondary | ICD-10-CM | POA: Insufficient documentation

## 2015-08-16 DIAGNOSIS — G8929 Other chronic pain: Secondary | ICD-10-CM | POA: Diagnosis not present

## 2015-08-16 DIAGNOSIS — Z79899 Other long term (current) drug therapy: Secondary | ICD-10-CM | POA: Diagnosis not present

## 2015-08-16 DIAGNOSIS — Z8659 Personal history of other mental and behavioral disorders: Secondary | ICD-10-CM | POA: Diagnosis not present

## 2015-08-16 DIAGNOSIS — R42 Dizziness and giddiness: Secondary | ICD-10-CM | POA: Diagnosis not present

## 2015-08-16 DIAGNOSIS — I10 Essential (primary) hypertension: Secondary | ICD-10-CM | POA: Insufficient documentation

## 2015-08-16 DIAGNOSIS — F17210 Nicotine dependence, cigarettes, uncomplicated: Secondary | ICD-10-CM | POA: Insufficient documentation

## 2015-08-16 DIAGNOSIS — R519 Headache, unspecified: Secondary | ICD-10-CM

## 2015-08-16 DIAGNOSIS — R51 Headache: Secondary | ICD-10-CM | POA: Diagnosis not present

## 2015-08-16 DIAGNOSIS — J069 Acute upper respiratory infection, unspecified: Secondary | ICD-10-CM | POA: Diagnosis not present

## 2015-08-16 DIAGNOSIS — E119 Type 2 diabetes mellitus without complications: Secondary | ICD-10-CM | POA: Diagnosis not present

## 2015-08-16 LAB — CBC WITH DIFFERENTIAL/PLATELET
Basophils Absolute: 0 10*3/uL (ref 0.0–0.1)
Basophils Relative: 0 %
Eosinophils Absolute: 0.1 10*3/uL (ref 0.0–0.7)
Eosinophils Relative: 2 %
HCT: 33.7 % — ABNORMAL LOW (ref 36.0–46.0)
Hemoglobin: 10.2 g/dL — ABNORMAL LOW (ref 12.0–15.0)
Lymphocytes Relative: 29 %
Lymphs Abs: 2.1 10*3/uL (ref 0.7–4.0)
MCH: 23.8 pg — ABNORMAL LOW (ref 26.0–34.0)
MCHC: 30.3 g/dL (ref 30.0–36.0)
MCV: 78.7 fL (ref 78.0–100.0)
Monocytes Absolute: 0.7 10*3/uL (ref 0.1–1.0)
Monocytes Relative: 10 %
Neutro Abs: 4.3 10*3/uL (ref 1.7–7.7)
Neutrophils Relative %: 59 %
Platelets: 343 10*3/uL (ref 150–400)
RBC: 4.28 MIL/uL (ref 3.87–5.11)
RDW: 14.6 % (ref 11.5–15.5)
WBC: 7.2 10*3/uL (ref 4.0–10.5)

## 2015-08-16 LAB — COMPREHENSIVE METABOLIC PANEL
ALT: 12 U/L — ABNORMAL LOW (ref 14–54)
AST: 16 U/L (ref 15–41)
Albumin: 3.8 g/dL (ref 3.5–5.0)
Alkaline Phosphatase: 43 U/L (ref 38–126)
Anion gap: 6 (ref 5–15)
BUN: 8 mg/dL (ref 6–20)
CO2: 25 mmol/L (ref 22–32)
Calcium: 9 mg/dL (ref 8.9–10.3)
Chloride: 108 mmol/L (ref 101–111)
Creatinine, Ser: 0.57 mg/dL (ref 0.44–1.00)
GFR calc Af Amer: >60 mL/min (ref >60)
GFR calc non Af Amer: >60 mL/min (ref >60)
Glucose, Bld: 215 mg/dL — ABNORMAL HIGH (ref 65–99)
Potassium: 3.9 mmol/L (ref 3.5–5.1)
Sodium: 139 mmol/L (ref 135–145)
Total Bilirubin: 0.4 mg/dL (ref 0.3–1.2)
Total Protein: 7.4 g/dL (ref 6.5–8.1)

## 2015-08-16 LAB — URINALYSIS, ROUTINE W REFLEX MICROSCOPIC
Bilirubin Urine: NEGATIVE
Glucose, UA: NEGATIVE mg/dL
Hgb urine dipstick: NEGATIVE
Ketones, ur: NEGATIVE mg/dL
Leukocytes, UA: NEGATIVE
Nitrite: NEGATIVE
Protein, ur: NEGATIVE mg/dL
Specific Gravity, Urine: 1.025 (ref 1.005–1.030)
pH: 5.5 (ref 5.0–8.0)

## 2015-08-16 LAB — PREGNANCY, URINE: Preg Test, Ur: NEGATIVE

## 2015-08-16 MED ORDER — KETOROLAC TROMETHAMINE 30 MG/ML IJ SOLN
30.0000 mg | Freq: Once | INTRAMUSCULAR | Status: AC
  Administered 2015-08-16: 30 mg via INTRAVENOUS
  Filled 2015-08-16: qty 1

## 2015-08-16 MED ORDER — METOCLOPRAMIDE HCL 5 MG/ML IJ SOLN
10.0000 mg | Freq: Once | INTRAMUSCULAR | Status: AC
  Administered 2015-08-16: 10 mg via INTRAVENOUS
  Filled 2015-08-16: qty 2

## 2015-08-16 MED ORDER — SODIUM CHLORIDE 0.9 % IV BOLUS (SEPSIS)
1000.0000 mL | Freq: Once | INTRAVENOUS | Status: AC
  Administered 2015-08-16: 1000 mL via INTRAVENOUS

## 2015-08-16 NOTE — ED Notes (Signed)
Pt c/o generalized muscle aches, cough/congestion and fatigue since Friday. Pt reports feeling lightheaded and dizzy today. Denies n/v/d. nad noted.

## 2015-08-16 NOTE — ED Notes (Signed)
Pt tolerating po well.  Pt ambulated around nurse's station x 1 independently, steady gait. Denies dizziness.

## 2015-08-16 NOTE — Discharge Instructions (Signed)

## 2015-08-16 NOTE — ED Notes (Addendum)
Pt states headache, facial pain and congestion began yesterday. States dizziness began this morning, which she notices with movement. Nonproductive cough started Friday. Pt denies N/V/D. Hx of migraines. States photophobia and phonophobia.

## 2015-08-16 NOTE — ED Provider Notes (Signed)
CSN: [id number]     Arrival date & time 08/16/15  1317 History   First MD Initiated Contact with Patient 08/16/15 1333     Chief Complaint  Patient presents with  . Headache     (Consider location/radiation/quality/duration/timing/severity/associated sxs/prior Treatment) HPI Comments: 2 days of congestion, body aches, headache, fatigue with lightheadedness with standing. Denies any nausea vomiting or diarrhea. No documented fevers. Gradual onset headache today with facial pain similar to previous migraine. Cough is nonproductive. Denies sick contacts at home did not receive a flu shot. No chest pain, abdominal pain, shortness of breath. States diffuse body aches. Headache is diffuse with photo and phonophobia similar to previous migraines. No thunderclap onset.  Patient is a 40 y.o. female presenting with headaches. The history is provided by the patient.  Headache Associated symptoms: congestion, cough, dizziness, fatigue, photophobia and weakness   Associated symptoms: no abdominal pain, no fever, no nausea and no vomiting     Past Medical History  Diagnosis Date  . Diabetes mellitus   . Depression   . Anxiety   . Hypertension   . Migraine   . High cholesterol   . Plantar fasciitis   . Neuropathic pain of foot   . Chronic back pain   . Sciatica    Past Surgical History  Procedure Laterality Date  . Back surgery    . Eye surgery    . Tubal ligation     Family History  Problem Relation Age of Onset  . Heart failure Mother   . COPD Mother   . Diabetes Mother   . Heart failure Father   . Diabetes Other    Social History  Substance Use Topics  . Smoking status: Current Every Day Smoker -- 1.00 packs/day for 20 years    Types: Cigarettes  . Smokeless tobacco: Never Used  . Alcohol Use: No     Comment: occ   OB History    Gravida Para Term Preterm AB TAB SAB Ectopic Multiple Living   Review of Systems  Constitutional: Positive for chills,  activity change, appetite change and fatigue. Negative for fever.  HENT: Positive for congestion and rhinorrhea.   Eyes: Positive for photophobia.  Respiratory: Positive for cough. Negative for chest tightness and shortness of breath.   Cardiovascular: Negative for chest pain.  Gastrointestinal: Negative for nausea, vomiting and abdominal pain.  Genitourinary: Negative for vaginal bleeding and vaginal discharge.  Neurological: Positive for dizziness, weakness, light-headedness and headaches.  A complete 10 system review of systems was obtained and all systems are negative except as noted in the HPI and PMH.      Allergies  Codeine; Benadryl; and Compazine  Home Medications   Prior to Admission medications   Medication Sig Start Date End Date Taking? Authorizing Provider  Phenylephrine-APAP-Guaifenesin (MUCINEX FAST-MAX COLD & SINUS PO) Take 30 mLs by mouth every 4 (four) hours as needed (cold).   Yes Historical Provider, MD  albuterol (PROVENTIL HFA;VENTOLIN HFA) 108 (90 BASE) MCG/ACT inhaler Inhale 2 puffs into the lungs every 6 (six) hours as needed for wheezing or shortness of breath. Patient not taking: Reported on 08/16/2015 07/23/14   Ivery Quale, PA-C  HYDROcodone-acetaminophen (NORCO/VICODIN) 5-325 MG tablet Take 1 tablet by mouth every 6 (six) hours as needed. Patient not taking: Reported on 08/16/2015 07/28/15   Bethann Berkshire, MD  LORazepam (ATIVAN) 1 MG tablet Take 1 tablet (1 mg total) by  mouth every 6 (six) hours as needed (Dizziness). Patient not taking: Reported on 08/16/2015 04/19/15   Mancel Bale, MD   BP 115/66 mmHg  Pulse 73  Temp(Src) 98.4 F (36.9 C) (Oral)  Resp 18  Ht  (1.727 m)  Wt 260 lb (117.935 kg)  BMI 39.54 kg/m2  SpO2 100%  LMP 07/23/2015 Physical Exam  Constitutional: She is oriented to person, place, and time. She appears well-developed and well-nourished. No distress.  HENT:  Head: Normocephalic and atraumatic.  Mouth/Throat: Oropharynx is  clear and moist. No oropharyngeal exudate.  Frontal sinus tenderness  Eyes: Conjunctivae and EOM are normal. Pupils are equal, round, and reactive to light.  Neck: Normal range of motion. Neck supple.  No meningismus.  Cardiovascular: Normal rate, regular rhythm, normal heart sounds and intact distal pulses.   No murmur heard. Pulmonary/Chest: Effort normal and breath sounds normal. No respiratory distress.  Abdominal: Soft. There is no tenderness. There is no rebound and no guarding.  Musculoskeletal: Normal range of motion. She exhibits no edema or tenderness.  Neurological: She is alert and oriented to person, place, and time. No cranial nerve deficit. She exhibits normal muscle tone. Coordination normal.  No ataxia on finger to nose bilaterally. No pronator drift. 5/5 strength throughout. CN 2-12 intact.Equal grip strength. Sensation intact.   Skin: Skin is warm.  Psychiatric: She has a normal mood and affect. Her behavior is normal.  Nursing note and vitals reviewed.   ED Course  Procedures (including critical care time) Labs Review Labs Reviewed  CBC WITH DIFFERENTIAL/PLATELET - Abnormal; Notable for the following:    Hemoglobin 10.2 (*)    HCT 33.7 (*)    MCH 23.8 (*)    All other components within normal limits  COMPREHENSIVE METABOLIC PANEL - Abnormal; Notable for the following:    Glucose, Bld 215 (*)    ALT 12 (*)    All other components within normal limits  URINALYSIS, ROUTINE W REFLEX MICROSCOPIC (NOT AT [facility])  PREGNANCY, URINE    Imaging Review No results found. I have personally reviewed and evaluated these images and lab results as part of my medical decision-making.   EKG Interpretation None      MDM   Final diagnoses:  Upper respiratory infection  Headache, unspecified headache type   2 days of diffuse bodyaches, headache, fatigue, subjective fever, nonproducitve cough.  Hemoglobin is stable. Labs show hyperglycemia without DKA.  Low suspicion  for meningitis, subarachnoid hemorrhage, temporal arteritis.  Suspect upper respiratory viral syndrome causing her headache and lightheadedness. She is given IV and by mouth fluids.  Headache has resolved. She is tolerating by mouth and ambulatory without difficulty. Continue PO hydration at home. Followup with PCP. Return precautions discussed.   Glynn Octave, MD 08/17/15 [phone]

## 2015-09-01 ENCOUNTER — Encounter: Payer: Self-pay | Admitting: *Deleted

## 2015-09-01 ENCOUNTER — Ambulatory Visit: Payer: BLUE CROSS/BLUE SHIELD | Admitting: Orthopaedic Surgery

## 2015-09-14 ENCOUNTER — Telehealth: Payer: Self-pay | Admitting: Orthopaedic Surgery

## 2015-09-14 MED ORDER — HYDROCODONE-ACETAMINOPHEN 7.5-325 MG PO TABS: 1.0000 | ORAL_TABLET | ORAL | Status: DC | PRN

## 2015-09-14 NOTE — Telephone Encounter (Signed)
Patient aware prescription is available for pick up

## 2015-09-14 NOTE — Telephone Encounter (Signed)
Rx printed

## 2015-09-14 NOTE — Telephone Encounter (Signed)
Hydrocodone 7.5-325 mgs.  Qty 120  Lasted filled on 08-16-15

## 2015-09-20 ENCOUNTER — Encounter (HOSPITAL_COMMUNITY): Payer: Self-pay | Admitting: *Deleted

## 2015-09-20 ENCOUNTER — Emergency Department (HOSPITAL_COMMUNITY)
Admission: EM | Admit: 2015-09-20 | Discharge: 2015-09-20 | Disposition: A | Discharge status: Home / Self Care | Payer: BLUE CROSS/BLUE SHIELD | Attending: Emergency Medicine | Admitting: Emergency Medicine

## 2015-09-20 DIAGNOSIS — Z8659 Personal history of other mental and behavioral disorders: Secondary | ICD-10-CM | POA: Diagnosis not present

## 2015-09-20 DIAGNOSIS — G8929 Other chronic pain: Secondary | ICD-10-CM | POA: Diagnosis not present

## 2015-09-20 DIAGNOSIS — E119 Type 2 diabetes mellitus without complications: Secondary | ICD-10-CM | POA: Diagnosis not present

## 2015-09-20 DIAGNOSIS — R200 Anesthesia of skin: Secondary | ICD-10-CM | POA: Diagnosis not present

## 2015-09-20 DIAGNOSIS — M25522 Pain in left elbow: Secondary | ICD-10-CM

## 2015-09-20 DIAGNOSIS — I10 Essential (primary) hypertension: Secondary | ICD-10-CM | POA: Insufficient documentation

## 2015-09-20 DIAGNOSIS — Z8739 Personal history of other diseases of the musculoskeletal system and connective tissue: Secondary | ICD-10-CM | POA: Diagnosis not present

## 2015-09-20 DIAGNOSIS — Z8639 Personal history of other endocrine, nutritional and metabolic disease: Secondary | ICD-10-CM | POA: Insufficient documentation

## 2015-09-20 DIAGNOSIS — R600 Localized edema: Secondary | ICD-10-CM | POA: Diagnosis not present

## 2015-09-20 DIAGNOSIS — F17210 Nicotine dependence, cigarettes, uncomplicated: Secondary | ICD-10-CM | POA: Insufficient documentation

## 2015-09-20 MED ORDER — KETOROLAC TROMETHAMINE 60 MG/2ML IM SOLN
60.0000 mg | Freq: Once | INTRAMUSCULAR | Status: AC
  Administered 2015-09-20: 60 mg via INTRAMUSCULAR
  Filled 2015-09-20: qty 2

## 2015-09-20 NOTE — ED Notes (Signed)
Pt comes in for left elbow pain starting 4 days ago. Denies any injury. No redness noted.

## 2015-09-20 NOTE — ED Notes (Signed)
Has noticed a raised area coming and going for the last week.  Reports no injury- but is taking hydrocodone for her right rotator cuff injury Rx'd by Dr Hilda Lias  She has an appointment with Dr Hilda Lias tomorrow morning Reports middle thru little finger tingling Works as [employer]- no repetitive movements with her left hand

## 2015-09-20 NOTE — ED Notes (Signed)
Pt verbalizes understanding of DC instructions, med regime, follow up with Dr Hilda Lias as she already has an appointment

## 2015-09-20 NOTE — ED Provider Notes (Signed)
CSN: [id number]     Arrival date & time 09/20/15  1653 History  By signing my name below, I, Tina Pham, attest that this documentation has been prepared under the direction and in the presence of Burgess Amor, PA-C. Electronically Signed: Angelene Pham, ED Scribe. 09/20/2015. 7:28 PM.    Chief Complaint  Patient presents with  . Elbow Pain   The history is provided by the patient. No language interpreter was used.   HPI Comments: Tina Pham is a 40 y.o. female with a hx of DM, HTN, and high cholesterol who presents to the Emergency Department complaining of gradually worsening pain to a "knot" and swelling in her left elbow that radiates down her left forearm onset 2 days ago. She reports associated numbness/tingling in her left fingers and limited ROM secondary to pain. 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 She denies any recent falls, injuries, or trauma. She states that she has tried Hydrocodone with mild relief. She reports an allergy to codeine, benadryl, and compazine. Pt has a hx tubal ligation. No fever, chills, or color change.   She has an appointment with her orthopedist tomorrow morning who is currently treating her for a right rotator cuff injury.   Past Medical History  Diagnosis Date  . Diabetes mellitus   . Depression   . Anxiety   . Hypertension   . Migraine   . High cholesterol   . Plantar fasciitis   . Neuropathic pain of foot   . Chronic back pain   . Sciatica    Past Surgical History  Procedure Laterality Date  . Back surgery    . Eye surgery    . Tubal ligation     Family History  Problem Relation Age of Onset  . Heart failure Mother   . COPD Mother   . Diabetes Mother   . Heart failure Father   . Diabetes Other    Social History  Substance Use Topics  . Smoking status: Current Every Day Smoker -- 1.00 packs/day for 20 years    Types: Cigarettes  . Smokeless tobacco: Never Used  .  Alcohol Use: No     Comment: occ   OB History    Gravida Para Term Preterm AB TAB SAB Ectopic Multiple Living   Review of Systems  Constitutional: Negative for fever and chills.  Musculoskeletal: Positive for joint swelling and arthralgias (left elbow).  Skin: Negative for color change.  Neurological: Positive for numbness.  All other systems reviewed and are negative.     Allergies  Codeine; Benadryl; and Compazine  Home Medications   Prior to Admission medications   Medication Sig Start Date End Date Taking? Authorizing Provider  HYDROcodone-acetaminophen (NORCO) 7.5-325 MG tablet Take 1 tablet by mouth every 4 (four) hours as needed for moderate pain (Must last 30 days.  Do not drive or operate machinery while taking this medicine.). 09/14/15  Yes Darreld Mclean, MD  albuterol (PROVENTIL HFA;VENTOLIN HFA) 108 (90 BASE) MCG/ACT inhaler Inhale 2 puffs into the lungs every 6 (six) hours as needed for wheezing or shortness of breath. Patient not taking: Reported on 09/21/2015 07/23/14   Ivery Quale, PA-C  LORazepam (ATIVAN) 1 MG tablet Take 1 tablet (1 mg total) by mouth every 6 (six) hours as needed (Dizziness). Patient not taking: Reported on 09/21/2015 04/19/15   Mancel Bale, MD  BP 153/79 mmHg  Pulse 86  Temp(Src) 99.1 F (37.3 C) (Oral)  Resp 16  Ht  (1.727 m)  Wt 111.131 kg  BMI 37.26 kg/m2  SpO2 100% Physical Exam  Constitutional: She is oriented to person, place, and time. She appears well-developed and well-nourished.  HENT:  Head: Normocephalic and atraumatic.  Cardiovascular: Normal rate.   Pulmonary/Chest: Effort normal.  Abdominal: She exhibits no distension.  Musculoskeletal: She exhibits edema and tenderness.  TTP over left elbow epicondyle with mild edema, no palpable nodules or deformity, no increased warmth or erythema, no forearm pain. Skin is intact Radial pulse intact Equal grip strength bilaterally   Neurological: She is  alert and oriented to person, place, and time.  Skin: Skin is warm and dry.  Psychiatric: She has a normal mood and affect.  Nursing note and vitals reviewed.   ED Course  Procedures (including critical care time) DIAGNOSTIC STUDIES: Oxygen Saturation is 100% on RA, normal by my interpretation.    COORDINATION OF CARE: 7:26 PM- Pt advised of plan for treatment and pt agrees. Advised to use heat to the area. Pt will receive an ace wrap for compression. She will also receive Toradol and advised to not use Ibuprofen in addition.   MDM   Final diagnoses:  Elbow pain, left    Suspect left lateral epicondylitis without clear mechanism of injury.  She was given a toradol injection to help with inflammation.  Currently taking hydrocodone.  Ace wrap, f/u with Dr. Hilda Lias tomorrow as planned.  There is no indication for imaging today which was discussed with pt.    I personally performed the services described in this documentation, which was scribed in my presence. The recorded information has been reviewed and is accurate.   Burgess Amor, PA-C 09/22/15 1204  Marily Memos, MD 09/22/15 [id number]

## 2015-09-20 NOTE — Discharge Instructions (Signed)
Joint Pain Joint pain, which is also called arthralgia, can be caused by many things. Joint pain often goes away when you follow your health care provider's instructions for relieving pain at home. However, joint pain can also be caused by conditions that require further treatment. Common causes of joint pain include:  Bruising in the area of the joint.  Overuse of the joint.  Wear and tear on the joints that occur with aging (osteoarthritis).  Various other forms of arthritis.  A buildup of a crystal form of uric acid in the joint (gout).  Infections of the joint (septic arthritis) or of the bone (osteomyelitis). Your health care provider may recommend medicine to help with the pain. If your joint pain continues, additional tests may be needed to diagnose your condition. HOME CARE INSTRUCTIONS Watch your condition for any changes. Follow these instructions as directed to lessen the pain that you are feeling.  Take medicines only as directed by your health care provider.  Rest the affected area for as long as your health care provider says that you should. If directed to do so, raise the painful joint above the level of your heart while you are sitting or lying down.  Do not do things that cause or worsen pain.  If directed, apply ice to the painful area:  Put ice in a plastic bag.  Place a towel between your skin and the bag.  Leave the ice on for 20 minutes, 2-3 times per day.  Wear an elastic bandage, splint, or sling as directed by your health care provider. Loosen the elastic bandage or splint if your fingers or toes become numb and tingle, or if they turn cold and blue.  Begin exercising or stretching the affected area as directed by your health care provider. Ask your health care provider what types of exercise are safe for you.  Keep all follow-up visits as directed by your health care provider. This is important. SEEK MEDICAL CARE IF:  Your pain increases, and medicine  does not help.  Your joint pain does not improve within 3 days.  You have increased bruising or swelling.  You have a fever.  You lose 10 lb (4.5 kg) or more without trying. SEEK IMMEDIATE MEDICAL CARE IF:  You are not able to move the joint.  Your fingers or toes become numb or they turn cold and blue.   This information is not intended to replace advice given to you by your health care provider. Make sure you discuss any questions you have with your health care provider.   Document Released: 07/10/2005 Document Revised: 07/31/2014 Document Reviewed: 04/21/2014 Elsevier Interactive Patient Education [employer]   As discussed your pain could be due to epicondylar strain and overuse (tennis elbow) but you may also have an inflamed bursa.  Keep your appointment with Dr. Hilda Lias tomorrow as planned.

## 2015-09-21 ENCOUNTER — Ambulatory Visit (INDEPENDENT_AMBULATORY_CARE_PROVIDER_SITE_OTHER): Payer: BLUE CROSS/BLUE SHIELD | Admitting: Orthopaedic Surgery

## 2015-09-21 VITALS — BP 131/83 | HR 88 | Temp 99.0°F | Ht 68.0 in | Wt 235.6 lb

## 2015-09-21 DIAGNOSIS — M25512 Pain in left shoulder: Secondary | ICD-10-CM | POA: Diagnosis not present

## 2015-09-21 NOTE — Progress Notes (Addendum)
Patient UX:LKGMW W Prom, female DOB:1976/07/23, 40 y.o. NUU:[id number]  Chief Complaint  Patient presents with  . Follow-up    Right shoulder pain "hurts"    HPI  Tina Pham is a 40 y.o. female who has chronic pain of the right shoulder.  She has no new trauma. She has been doing exercises at home. She has no paresthesias.  Shoulder Pain  The pain is present in the right shoulder. This is a chronic problem. The current episode started more than 1 year ago. There has been no history of extremity trauma. The problem occurs daily. The problem has been waxing and waning. The quality of the pain is described as aching. The pain is at a severity of 4/10. The pain is moderate. The symptoms are aggravated by activity. She has tried NSAIDS, oral narcotics, rest, cold and heat for the symptoms. The treatment provided mild relief.    Body mass index is 35.83 kg/(m^2).   Review of Systems  Constitutional:       Patient has Diabetes Mellitus. Patient does not have hypertension. Patient does not have COPD or shortness of breath. Patient has BMI > 35. Patient has current smoking history.  HENT: Negative for congestion.   Respiratory: Negative for cough and shortness of breath.   Cardiovascular: Negative for chest pain.  Endocrine: Negative for cold intolerance.  Musculoskeletal: Positive for myalgias, back pain and arthralgias.  Allergic/Immunologic: Negative for environmental allergies.  Psychiatric/Behavioral: The patient is nervous/anxious.     Past Medical History  Diagnosis Date  . Diabetes mellitus   . Depression   . Anxiety   . Hypertension   . Migraine   . High cholesterol   . Plantar fasciitis   . Neuropathic pain of foot   . Chronic back pain   . Sciatica     Past Surgical History  Procedure Laterality Date  . Back surgery    . Eye surgery    . Tubal ligation      Family History  Problem Relation Age of Onset  . Heart failure Mother   . COPD Mother    . Diabetes Mother   . Heart failure Father   . Diabetes Other     Social History Social History  Substance Use Topics  . Smoking status: Current Every Day Smoker -- 1.00 packs/day for 20 years    Types: Cigarettes  . Smokeless tobacco: Never Used  . Alcohol Use: No     Comment: occ    Allergies  Allergen Reactions  . Codeine Other (See Comments)    Upset Stomach  . Benadryl [Diphenhydramine Hcl] Palpitations  . Compazine Palpitations    Current Outpatient Prescriptions  Medication Sig Dispense Refill  . HYDROcodone-acetaminophen (NORCO) 7.5-325 MG tablet Take 1 tablet by mouth every 4 (four) hours as needed for moderate pain (Must last 30 days.  Do not drive or operate machinery while taking this medicine.). 120 tablet 0  . albuterol (PROVENTIL HFA;VENTOLIN HFA) 108 (90 BASE) MCG/ACT inhaler Inhale 2 puffs into the lungs every 6 (six) hours as needed for wheezing or shortness of breath. (Patient not taking: Reported on 09/21/2015) 1 Inhaler 0  . LORazepam (ATIVAN) 1 MG tablet Take 1 tablet (1 mg total) by mouth every 6 (six) hours as needed (Dizziness). (Patient not taking: Reported on 09/21/2015) 20 tablet 0   No current facility-administered medications for this visit.   Facility-Administered Medications Ordered in Other Visits  Medication Dose Route Frequency Provider Last Rate Last Dose  .  ondansetron (ZOFRAN) injection 4 mg  4 mg Intravenous Once John-Adam Bonk, MD         Physical Exam  Blood pressure 131/83, pulse 88, temperature 99 F (37.2 C), height  (1.727 m), weight 235 lb 9.6 oz (106.867 kg).  Constitutional: overall normal hygiene, normal nutrition, well developed, normal grooming, normal body habitus. Assistive device:none  Musculoskeletal: gait and station Limp none, muscle tone and strength are normal, no tremors or atrophy is present.  .  Neurological: coordination overall normal.  Deep tendon reflex/nerve stretch intact.  Sensation normal.   Cranial nerves II-XII intact.   Skin:   normal overall no scars, lesions, ulcers or rashes. No psoriasis.  Psychiatric: Alert and oriented x 3.  Recent memory intact, remote memory unclear.  Normal mood and affect. Well groomed.  Good eye contact.  Cardiovascular: overall no swelling, no varicosities, no edema bilaterally, normal temperatures of the legs and arms, no clubbing, cyanosis and good capillary refill.  Lymphatic: palpation is normal.  Examination of right Upper Extremity is done.  Inspection:   Overall:  Elbow non-tender without crepitus or defects, forearm non-tender without crepitus or defects, wrist non-tender without crepitus or defects, hand non-tender.    Shoulder: with glenohumeral joint tenderness, without effusion.   Upper arm: without swelling and tenderness   Range of motion:   Overall:  Full range of motion of the elbow, full range of motion of wrist and full range of motion in fingers.   Shoulder:  right  110 degrees forward flexion; 90 degrees abduction; 35 degrees internal rotation, 35 degrees external rotation, 20 degrees extension, 40 degrees adduction.   Stability:   Overall:  Shoulder, elbow and wrist stable   Strength and Tone:   Overall full shoulder muscles strength, full upper arm strength and normal upper arm bulk and tone.  The left shoulder has full motion and no pain.  Neck has full motion and no pain.  She is managing her diabetes well.  Blood sugars are controlled well with diet.  I went over exercises of the right shoulder again with here.  She may need surgery on the shoulder.  I have talked to her again about cutting back or stopping smoking all together.  She is ready to consider quitting.  She will do better if she is not smoking when she has surgery.  The patient has been educated about the nature of the problem(s) and counseled on treatment options.  The patient appeared to understand what I have discussed and is in agreement with  it. Encounter Diagnosis  Name Primary?  . Left shoulder pain Yes    PLAN Call if any problems.  Precautions discussed.  Continue current medications.   Return to clinic six weeks

## 2015-09-21 NOTE — Patient Instructions (Signed)
Continue exercises at home  

## 2015-10-11 ENCOUNTER — Telehealth: Payer: Self-pay | Admitting: Orthopaedic Surgery

## 2015-10-11 NOTE — Telephone Encounter (Signed)
Patient called and requested a refill on  Hydrocodone  7.5-325 mgs.  Qty  120.  This was last filled on 09-14-15.

## 2015-10-12 MED ORDER — HYDROCODONE-ACETAMINOPHEN 5-325 MG PO TABS: 1.0000 | ORAL_TABLET | ORAL | Status: DC | PRN

## 2015-10-12 NOTE — Telephone Encounter (Signed)
Rx printed

## 2015-11-02 ENCOUNTER — Ambulatory Visit: Payer: BLUE CROSS/BLUE SHIELD | Admitting: Orthopaedic Surgery

## 2015-11-04 ENCOUNTER — Ambulatory Visit (INDEPENDENT_AMBULATORY_CARE_PROVIDER_SITE_OTHER): Payer: BLUE CROSS/BLUE SHIELD | Admitting: Orthopaedic Surgery

## 2015-11-04 DIAGNOSIS — M25512 Pain in left shoulder: Secondary | ICD-10-CM

## 2015-11-07 NOTE — Progress Notes (Signed)
No show

## 2015-11-10 ENCOUNTER — Ambulatory Visit: Payer: BLUE CROSS/BLUE SHIELD | Admitting: Orthopaedic Surgery

## 2015-11-11 ENCOUNTER — Telehealth: Payer: Self-pay | Admitting: Orthopaedic Surgery

## 2015-11-11 MED ORDER — HYDROCODONE-ACETAMINOPHEN 5-325 MG PO TABS: 1.0000 | ORAL_TABLET | ORAL | Status: DC | PRN

## 2015-11-11 NOTE — Telephone Encounter (Signed)
Patient requesting Hydrocodone refill

## 2015-11-11 NOTE — Telephone Encounter (Signed)
Rx done. 

## 2015-11-18 ENCOUNTER — Encounter: Payer: Self-pay | Admitting: Orthopaedic Surgery

## 2015-11-18 ENCOUNTER — Ambulatory Visit (INDEPENDENT_AMBULATORY_CARE_PROVIDER_SITE_OTHER): Payer: BLUE CROSS/BLUE SHIELD | Admitting: Orthopaedic Surgery

## 2015-11-18 VITALS — BP 143/81 | HR 77 | Temp 98.1°F | Ht 68.0 in | Wt 235.0 lb

## 2015-11-18 DIAGNOSIS — M25512 Pain in left shoulder: Secondary | ICD-10-CM

## 2015-11-18 NOTE — Progress Notes (Signed)
Patient JY:NWGNF:Tina Pham, female DOB:12/30/1975, 40 y.o. AOZ:[id number]  Chief Complaint  Patient presents with  . Follow-up    Right shoulder    HPI  Tina Pham is a 40 y.o. female who has chronic right shoulder pain.  She is improved.  She has less pain and less tenderness.  She has no paresthesias.  She has been taking the medicine and doing the exercises. Motion is better.  She feels better.  HPI  Body mass index is 35.74 kg/(m^2).  Review of Systems  Constitutional:       Patient has Diabetes Mellitus. Patient does not have hypertension. Patient does not have COPD or shortness of breath. Patient has BMI > 35. Patient has current smoking history.  HENT: Negative for congestion.   Respiratory: Negative for cough and shortness of breath.   Cardiovascular: Negative for chest pain.  Endocrine: Negative for cold intolerance.  Musculoskeletal: Positive for myalgias, back pain and arthralgias.  Allergic/Immunologic: Negative for environmental allergies.  Psychiatric/Behavioral: The patient is nervous/anxious.     Past Medical History  Diagnosis Date  . Diabetes mellitus   . Depression   . Anxiety   . Hypertension   . Migraine   . High cholesterol   . Plantar fasciitis   . Neuropathic pain of foot   . Chronic back pain   . Sciatica     Past Surgical History  Procedure Laterality Date  . Back surgery    . Eye surgery    . Tubal ligation      Family History  Problem Relation Age of Onset  . Heart failure Mother   . COPD Mother   . Diabetes Mother   . Heart failure Father   . Diabetes Other     Social History Social History  Substance Use Topics  . Smoking status: Current Every Day Smoker -- 1.00 packs/day for 20 years    Types: Cigarettes  . Smokeless tobacco: Never Used  . Alcohol Use: No     Comment: occ    Allergies  Allergen Reactions  . Codeine Other (See Comments)    Upset Stomach  . Benadryl [Diphenhydramine Hcl] Palpitations   . Compazine Palpitations    Current Outpatient Prescriptions  Medication Sig Dispense Refill  . albuterol (PROVENTIL HFA;VENTOLIN HFA) 108 (90 BASE) MCG/ACT inhaler Inhale 2 puffs into the lungs every 6 (six) hours as needed for wheezing or shortness of breath. 1 Inhaler 0  . HYDROcodone-acetaminophen (NORCO/VICODIN) 5-325 MG tablet Take 1 tablet by mouth every 4 (four) hours as needed for moderate pain (Must last 30 days.  Do not take and drive a car or use machinery.). 120 tablet 0  . LORazepam (ATIVAN) 1 MG tablet Take 1 tablet (1 mg total) by mouth every 6 (six) hours as needed (Dizziness). 20 tablet 0   No current facility-administered medications for this visit.   Facility-Administered Medications Ordered in Other Visits  Medication Dose Route Frequency Provider Last Rate Last Dose  . ondansetron (ZOFRAN) injection 4 mg  4 mg Intravenous Once John-Adam Bonk, MD         Physical Exam  Blood pressure 143/81, pulse 77, temperature 98.1 F (36.7 C), height 5\' 8"  (1.727 m), weight 235 lb (106.595 kg).  Constitutional: overall normal hygiene, normal nutrition, well developed, normal grooming, normal body habitus. Assistive device:none  Musculoskeletal: gait and station Limp none, muscle tone and strength are normal, no tremors or atrophy is present.  .  Neurological: coordination overall normal.  Deep  tendon reflex/nerve stretch intact.  Sensation normal.  Cranial nerves II-XII intact.   Skin:   normal overall no scars, lesions, ulcers or rashes. No psoriasis.  Psychiatric: Alert and oriented x 3.  Recent memory intact, remote memory unclear.  Normal mood and affect. Well groomed.  Good eye contact.  Cardiovascular: overall no swelling, no varicosities, no edema bilaterally, normal temperatures of the legs and arms, no clubbing, cyanosis and good capillary refill.  Lymphatic: palpation is normal. Examination of right Upper Extremity is done.  Inspection:   Overall:  Elbow  non-tender without crepitus or defects, forearm non-tender without crepitus or defects, wrist non-tender without crepitus or defects, hand non-tender.    Shoulder: with glenohumeral joint tenderness, without effusion.   Upper arm: without swelling and tenderness   Range of motion:   Overall:  Full range of motion of the elbow, full range of motion of wrist and full range of motion in fingers.   Shoulder:  right  145 degrees forward flexion; 100 degrees abduction; 35 degrees internal rotation, 35 degrees external rotation, 20 degrees extension, 40 degrees adduction.   Stability:   Overall:  Shoulder, elbow and wrist stable   Strength and Tone:   Overall full shoulder muscles strength, full upper arm strength and normal upper arm bulk and tone.  The patient has been educated about the nature of the problem(s) and counseled on treatment options.  The patient appeared to understand what I have discussed and is in agreement with it.  Encounter Diagnosis  Name Primary?  . Left shoulder pain Yes    PLAN Call if any problems.  Precautions discussed.  Continue current medications.   Return to clinic 2 months

## 2015-11-22 ENCOUNTER — Encounter (HOSPITAL_COMMUNITY): Payer: Self-pay | Admitting: Emergency Medicine

## 2015-11-22 ENCOUNTER — Emergency Department (HOSPITAL_COMMUNITY)
Admission: EM | Admit: 2015-11-22 | Discharge: 2015-11-22 | Disposition: A | Discharge status: Home / Self Care | Payer: BLUE CROSS/BLUE SHIELD | Attending: Emergency Medicine | Admitting: Emergency Medicine

## 2015-11-22 DIAGNOSIS — E119 Type 2 diabetes mellitus without complications: Secondary | ICD-10-CM | POA: Insufficient documentation

## 2015-11-22 DIAGNOSIS — F329 Major depressive disorder, single episode, unspecified: Secondary | ICD-10-CM | POA: Insufficient documentation

## 2015-11-22 DIAGNOSIS — I10 Essential (primary) hypertension: Secondary | ICD-10-CM | POA: Diagnosis not present

## 2015-11-22 DIAGNOSIS — F17210 Nicotine dependence, cigarettes, uncomplicated: Secondary | ICD-10-CM | POA: Insufficient documentation

## 2015-11-22 DIAGNOSIS — N644 Mastodynia: Secondary | ICD-10-CM | POA: Insufficient documentation

## 2015-11-22 MED ORDER — NAPROXEN 500 MG PO TABS: 500.0000 mg | ORAL_TABLET | Freq: Two times a day (BID) | ORAL | Status: DC

## 2015-11-22 MED ORDER — CEPHALEXIN 500 MG PO CAPS: 500.0000 mg | ORAL_CAPSULE | Freq: Three times a day (TID) | ORAL | Status: DC

## 2015-11-22 NOTE — ED Notes (Signed)
Pt reports LT breast pain for years. States last night she was lying in bed when she noticed a couple of "knots" on her breast.

## 2015-11-22 NOTE — ED Notes (Signed)
Pt states understanding of care given and follow up instructions.  Ambulated from ED  

## 2015-11-22 NOTE — ED Provider Notes (Signed)
CSN: [id number]     Arrival date & time 11/22/15  1757 History   First MD Initiated Contact with Patient 11/22/15 1951     Chief Complaint  Patient presents with  . Breast Problem     (Consider location/radiation/quality/duration/timing/severity/associated sxs/prior Treatment) HPI Tina Pham is a 40 y.o. female who presents to the ED with left breast pain. She reports having pain and problems off and on for the past 5 years. She has had infected milk glands in the past. She states that last night she noted tenderness in the left breast and felt 2 knots. She does not have a PCP at this time.  Past Medical History  Diagnosis Date  . Diabetes mellitus   . Depression   . Anxiety   . Hypertension   . Migraine   . High cholesterol   . Plantar fasciitis   . Neuropathic pain of foot   . Chronic back pain   . Sciatica    Past Surgical History  Procedure Laterality Date  . Back surgery    . Eye surgery    . Tubal ligation     Family History  Problem Relation Age of Onset  . Heart failure Mother   . COPD Mother   . Diabetes Mother   . Heart failure Father   . Diabetes Other    Social History  Substance Use Topics  . Smoking status: Current Every Day Smoker -- 1.00 packs/day for 20 years    Types: Cigarettes  . Smokeless tobacco: Never Used  . Alcohol Use: No     Comment: occ   OB History    Gravida Para Term Preterm AB TAB SAB Ectopic Multiple Living   Review of Systems Negative except as stated in HPI   Allergies  Codeine; Benadryl; and Compazine  Home Medications   Prior to Admission medications   Medication Sig Start Date End Date Taking? Authorizing Provider  albuterol (PROVENTIL HFA;VENTOLIN HFA) 108 (90 BASE) MCG/ACT inhaler Inhale 2 puffs into the lungs every 6 (six) hours as needed for wheezing or shortness of breath. 07/23/14   Ivery Quale, PA-C  cephALEXin (KEFLEX) 500 MG capsule Take 1 capsule (500 mg total) by mouth 3  (three) times daily. 11/22/15   Janiyah Beery Orlene Och, NP  HYDROcodone-acetaminophen (NORCO/VICODIN) 5-325 MG tablet Take 1 tablet by mouth every 4 (four) hours as needed for moderate pain (Must last 30 days.  Do not take and drive a car or use machinery.). 11/11/15   Darreld Mclean, MD  LORazepam (ATIVAN) 1 MG tablet Take 1 tablet (1 mg total) by mouth every 6 (six) hours as needed (Dizziness). 04/19/15   Mancel Bale, MD  naproxen (NAPROSYN) 500 MG tablet Take 1 tablet (500 mg total) by mouth 2 (two) times daily. 11/22/15   Jinelle Butchko Orlene Och, NP   BP 137/75 mmHg  Pulse 92  Temp(Src) 98 F (36.7 C) (Oral)  Resp 16  Ht  (1.753 m)  Wt 108.863 kg  BMI 35.43 kg/m2  SpO2 100%  LMP 11/20/2015 Physical Exam  Constitutional: She is oriented to person, place, and time. She appears well-developed and well-nourished.  HENT:  Head: Normocephalic and atraumatic.  Eyes: Conjunctivae and EOM are normal.  Neck: Neck supple.  Cardiovascular: Normal rate.   Pulmonary/Chest: Effort normal. Right breast exhibits no inverted nipple, no nipple discharge, no skin change and no tenderness. Left breast exhibits tenderness. Left  breast exhibits no inverted nipple, no nipple discharge and no skin change.    Left breast tender on palpation 2 5 o'clock. Three are multiple small nodes palpated. Axillary tenderness but no nodes palpated in the axilla.   Musculoskeletal: Normal range of motion.  Neurological: She is alert and oriented to person, place, and time. No cranial nerve deficit.  Skin: Skin is warm and dry.  Psychiatric: She has a normal mood and affect. Her behavior is normal.  Nursing note and vitals reviewed.   ED Course  Procedures (including critical care time) Labs Review Labs Reviewed - No data to display   MDM  40 y.o. female with left breast tenderness stable for d/c without drainage, erythema or red streaking noted. Will start Keflex in case of infection and [facility]. Discussed with the patient and all questioned fully answered.  She voices understanding and agrees with plan.   Final diagnoses:  Breast pain        Janne NapoleonHope M Kellis Mcadam, NP 11/22/15 2050  Bethann BerkshireJoseph Zammit, MD 11/22/15 [phone]

## 2015-11-22 NOTE — Discharge Instructions (Signed)
I am treating you with an antibiotic  In case there is infection. You will need to call [facility] for further evaluation.   Breast Tenderness Breast tenderness is a common problem for women of all ages. Breast tenderness may cause mild discomfort to severe pain. It has a variety of causes. Your health care provider will find out the likely cause of your breast tenderness by examining your breasts, asking you about symptoms, and ordering some tests. Breast tenderness usually does not mean you have breast cancer. HOME CARE INSTRUCTIONS  Breast tenderness often can be handled at home. You can try:  Getting fitted for a new bra that provides more support, especially during exercise.  Wearing a more supportive bra or sports bra while sleeping when your breasts are very tender.  If you have a breast injury, apply ice to the area:  Put ice in a plastic bag.  Place a towel between your skin and the bag.  Leave the ice on for 20 minutes, 2-3 times a day.  If your breasts are too full of milk as a result of breastfeeding, try:  Expressing milk either by hand or with a breast pump.  Applying a warm compress to the breasts for relief.  Taking over-the-counter pain relievers, if approved by your health care provider.  Taking other medicines that your health care provider prescribes. These may include antibiotic medicines or birth control pills. Over the long term, your breast tenderness might be eased if you:  Cut down on caffeine.  Reduce the amount of fat in your diet. Keep a log of the days and times when your breasts are most tender. This will help you and your health care provider find the cause of the tenderness and how to relieve it. Also, learn how to do breast exams at home. This will help you notice if you have an unusual growth or lump that could cause tenderness. SEEK MEDICAL CARE IF:   Any part of your breast is hard, red, and hot to the touch. This could be a sign of  infection.  Fluid is coming out of your nipples (and you are not breastfeeding). Especially watch for blood or pus.  You have a fever as well as breast tenderness.  You have a new or painful lump in your breast that remains after your menstrual period ends.  You have tried to take care of the pain at home, but it has not gone away.  Your breast pain is getting worse, or the pain is making it hard to do the things you usually do during your day.   This information is not intended to replace advice given to you by your health care provider. Make sure you discuss any questions you have with your health care provider.   Document Released: 06/22/2008 Document Revised: 03/12/2013 Document Reviewed: 02/06/2013 Elsevier Interactive Patient Education [employer]2016 [employer] Inc.

## 2015-12-09 ENCOUNTER — Telehealth: Payer: Self-pay | Admitting: Orthopaedic Surgery

## 2015-12-09 MED ORDER — HYDROCODONE-ACETAMINOPHEN 5-325 MG PO TABS: 1.0000 | ORAL_TABLET | ORAL | Status: DC | PRN

## 2015-12-09 NOTE — Telephone Encounter (Signed)
Patient called to request refill on medication: HYDROcodone-acetaminophen (NORCO/VICODIN) 5-325 MG tablet [[id number]][[id number]] - quantity 120 - please advise.

## 2015-12-09 NOTE — Telephone Encounter (Signed)
Rx done. 

## 2016-01-06 ENCOUNTER — Telehealth: Payer: Self-pay | Admitting: Orthopaedic Surgery

## 2016-01-06 MED ORDER — HYDROCODONE-ACETAMINOPHEN 5-325 MG PO TABS: 1.0000 | ORAL_TABLET | ORAL | Status: DC | PRN

## 2016-01-06 NOTE — Telephone Encounter (Signed)
Hydrocodone-Acetaminophen  5/325mg  Qty 120 Tablets °

## 2016-01-06 NOTE — Telephone Encounter (Signed)
Rx done. 

## 2016-01-18 ENCOUNTER — Ambulatory Visit: Payer: BLUE CROSS/BLUE SHIELD | Admitting: Orthopaedic Surgery

## 2016-01-30 ENCOUNTER — Emergency Department (HOSPITAL_COMMUNITY)
Admission: EM | Admit: 2016-01-30 | Discharge: 2016-01-30 | Disposition: A | Discharge status: Home / Self Care | Payer: BLUE CROSS/BLUE SHIELD | Attending: Emergency Medicine | Admitting: Emergency Medicine

## 2016-01-30 ENCOUNTER — Emergency Department (HOSPITAL_COMMUNITY): Payer: BLUE CROSS/BLUE SHIELD

## 2016-01-30 ENCOUNTER — Encounter (HOSPITAL_COMMUNITY): Payer: Self-pay | Admitting: Emergency Medicine

## 2016-01-30 DIAGNOSIS — E119 Type 2 diabetes mellitus without complications: Secondary | ICD-10-CM | POA: Diagnosis not present

## 2016-01-30 DIAGNOSIS — M5441 Lumbago with sciatica, right side: Secondary | ICD-10-CM | POA: Diagnosis not present

## 2016-01-30 DIAGNOSIS — I10 Essential (primary) hypertension: Secondary | ICD-10-CM | POA: Insufficient documentation

## 2016-01-30 DIAGNOSIS — F329 Major depressive disorder, single episode, unspecified: Secondary | ICD-10-CM | POA: Insufficient documentation

## 2016-01-30 DIAGNOSIS — F17210 Nicotine dependence, cigarettes, uncomplicated: Secondary | ICD-10-CM | POA: Diagnosis not present

## 2016-01-30 DIAGNOSIS — M545 Low back pain: Secondary | ICD-10-CM | POA: Diagnosis present

## 2016-01-30 LAB — URINALYSIS, ROUTINE W REFLEX MICROSCOPIC
Bilirubin Urine: NEGATIVE
Glucose, UA: NEGATIVE mg/dL
Hgb urine dipstick: NEGATIVE
Ketones, ur: NEGATIVE mg/dL
Nitrite: NEGATIVE
Protein, ur: NEGATIVE mg/dL
Specific Gravity, Urine: 1.02 (ref 1.005–1.030)
pH: 6 (ref 5.0–8.0)

## 2016-01-30 LAB — PREGNANCY, URINE: Preg Test, Ur: NEGATIVE

## 2016-01-30 LAB — URINE MICROSCOPIC-ADD ON

## 2016-01-30 MED ORDER — METHOCARBAMOL 500 MG PO TABS: 500.0000 mg | ORAL_TABLET | Freq: Four times a day (QID) | ORAL | Status: DC

## 2016-01-30 MED ORDER — KETOROLAC TROMETHAMINE 60 MG/2ML IM SOLN
60.0000 mg | Freq: Once | INTRAMUSCULAR | Status: AC
  Administered 2016-01-30: 60 mg via INTRAMUSCULAR
  Filled 2016-01-30: qty 2

## 2016-01-30 NOTE — ED Provider Notes (Signed)
CSN: [id number]     Arrival date & time 01/30/16  1034 History  By signing my name below, I, Tanda RockersMargaux Venter, attest that this documentation has been prepared under the direction and in the presence of Langston MaskerKaren Sofia, [place]-C.  Electronically Signed: Tanda RockersMargaux Venter, ED Scribe. 01/30/2016. 12:02 PM.   Chief Complaint  Patient presents with  . Back Pain   Patient is a 40 y.o. female presenting with back pain. The history is provided by the patient. No language interpreter was used.  Back Pain Location:  Lumbar spine Radiates to:  Does not radiate Pain severity:  Severe Pain is:  Same all the time Onset quality:  Sudden Duration:  2 days Timing:  Constant Progression:  Unchanged Chronicity:  Chronic Context: not recent injury   Relieved by:  Nothing Worsened by:  Movement Ineffective treatments:  Narcotics Associated symptoms: no bladder incontinence, no bowel incontinence, no numbness, no paresthesias, no perianal numbness, no tingling and no weakness     HPI Comments: Tina Pham is a 40 y.o. female with PMHx DM, HTN, HLD, chronic back pain, and sciatica who presents to the Emergency Department complaining of sudden onset, constant, right lower back pain x 2 days. Pt reports that she was walking when she felt a sudden pain in her back and her right leg gave out on her. She has been taking Hydrocodone without relief. Pt reports lumbar back surgery in 2009 by Dr. Franky Machoabbell. She is following with Dr. Hilda LiasKeeling at this time for rotator cuff issues. Denies urinary or bowel incontinence, saddle anesthesia, weakness, numbness, tingling, or any other associated symptoms.   Past Medical History  Diagnosis Date  . Diabetes mellitus   . Depression   . Anxiety   . Hypertension   . Migraine   . High cholesterol   . Plantar fasciitis   . Neuropathic pain of foot   . Chronic back pain   . Sciatica    Past Surgical History  Procedure Laterality Date  . Back surgery    . Eye surgery    . Tubal  ligation     Family History  Problem Relation Age of Onset  . Heart failure Mother   . COPD Mother   . Diabetes Mother   . Heart failure Father   . Diabetes Other    Social History  Substance Use Topics  . Smoking status: Current Every Day Smoker -- 1.00 packs/day for 20 years    Types: Cigarettes  . Smokeless tobacco: Never Used  . Alcohol Use: No     Comment: occ   OB History    Gravida Para Term Preterm AB TAB SAB Ectopic Multiple Living   3 3 3       3      Review of Systems  Gastrointestinal: Negative for bowel incontinence.  Genitourinary: Negative for bladder incontinence.  Musculoskeletal: Positive for back pain.  Neurological: Negative for tingling, weakness, numbness and paresthesias.  All other systems reviewed and are negative.  Allergies  Codeine; Benadryl; and Compazine  Home Medications   Prior to Admission medications   Medication Sig Start Date End Date Taking? Authorizing Provider  albuterol (PROVENTIL HFA;VENTOLIN HFA) 108 (90 BASE) MCG/ACT inhaler Inhale 2 puffs into the lungs every 6 (six) hours as needed for wheezing or shortness of breath. 07/23/14   Ivery QualeHobson Bryant, PA-C  cephALEXin (KEFLEX) 500 MG capsule Take 1 capsule (500 mg total) by mouth 3 (three) times daily. 11/22/15   Hope Orlene OchM Neese, NP  HYDROcodone-acetaminophen (  NORCO/VICODIN) 5-325 MG tablet Take 1 tablet by mouth every 4 (four) hours as needed for moderate pain (Must last 30 days.  Do not take and drive a car or use machinery.). 01/06/16   Darreld Mclean, MD  LORazepam (ATIVAN) 1 MG tablet Take 1 tablet (1 mg total) by mouth every 6 (six) hours as needed (Dizziness). 04/19/15   Mancel Bale, MD  naproxen (NAPROSYN) 500 MG tablet Take 1 tablet (500 mg total) by mouth 2 (two) times daily. 11/22/15   Hope Orlene Och, NP   BP 112/81 mmHg  Pulse 115  Temp(Src) 98.2 F (36.8 C) (Oral)  Resp 18  Ht  (1.727 m)  Wt 238 lb (107.956 kg)  BMI 36.20 kg/m2  SpO2 100%  LMP 12/24/2015   Physical Exam   Constitutional: She is oriented to person, place, and time. She appears well-developed and well-nourished. No distress.  HENT:  Head: Normocephalic and atraumatic.  Eyes: Conjunctivae and EOM are normal.  Neck: Neck supple. No tracheal deviation present.  Cardiovascular: Normal rate.   Pulmonary/Chest: Effort normal. No respiratory distress.  Musculoskeletal:  Diffusely tender l1-l2 area,  Tender paravertebrals,  Pain with range of motion,  nv and ns intact  Neurological: She is alert and oriented to person, place, and time.  Skin: Skin is warm and dry.  Psychiatric: She has a normal mood and affect. Her behavior is normal.  Nursing note and vitals reviewed.   ED Course  Procedures (including critical care time)  DIAGNOSTIC STUDIES: Oxygen Saturation is 100% on RA, normal by my interpretation.    COORDINATION OF CARE: 11:40 AM-Discussed treatment plan which includes Toradol injection and DG L Spine with pt at bedside and pt agreed to plan.   Labs Review Labs Reviewed  URINALYSIS, ROUTINE W REFLEX MICROSCOPIC (NOT AT [facility]) - Abnormal; Notable for the following:    APPearance HAZY (*)    Leukocytes, UA SMALL (*)    All other components within normal limits  URINE MICROSCOPIC-ADD ON - Abnormal; Notable for the following:    Squamous Epithelial / LPF TOO NUMEROUS TO COUNT (*)    Bacteria, UA MANY (*)    All other components within normal limits  PREGNANCY, URINE    Imaging Review Dg Lumbar Spine Complete  01/30/2016  CLINICAL DATA:  Low back pain EXAM: LUMBAR SPINE - COMPLETE 4+ VIEW COMPARISON:  02/21/2014 FINDINGS: Anterior metallic hardware for L5-S1 fusion is stable. A disc spacer is integrated at this level. There is no vertebral compression deformity. Disc height is maintained at L4-5. Anterior osteophytes are seen throughout the lower thoracic and lumbar spine. No definite fracture. Vascular calcifications. IMPRESSION: No acute bony pathology.  Aortic atherosclerosis.   Postop changes. Electronically Signed   By: Jolaine Click M.D.   On: 01/30/2016 12:32   I have personally reviewed and evaluated these images and lab results as part of my medical decision-making.   EKG Interpretation None      MDM   Final diagnoses:  Midline low back pain with right-sided sciatica   Meds ordered this encounter  Medications  . ketorolac (TORADOL) injection 60 mg    Sig:   . methocarbamol (ROBAXIN) 500 MG tablet    Sig: Take 1 tablet (500 mg total) by mouth 4 (four) times daily.    Dispense:  20 tablet    Refill:  0    Order Specific Question:  Supervising Provider    Answer:  Eber Hong [[id number]]  An After Visit Summary was  printed and given to the patient.      Elson Areas, PA-C 01/30/16 1248  [address] [place], PA-C 01/30/16 1252  Donnetta Hutching, MD 02/02/16 [phone]

## 2016-01-30 NOTE — Discharge Instructions (Signed)

## 2016-01-30 NOTE — ED Notes (Signed)
Patient c/o mid back pain that radiates into right flank. Denies any urinary symptoms. Per patient "has been hurting for a while." Patient states that walking to store Friday right leg "went out and pain started. " Patient reports hx of back surgeries. Denies any nausea, vomiting, diarrhea, or fevers.

## 2016-02-01 ENCOUNTER — Ambulatory Visit: Payer: BLUE CROSS/BLUE SHIELD | Admitting: Orthopaedic Surgery

## 2016-02-09 ENCOUNTER — Encounter: Payer: Self-pay | Admitting: Orthopaedic Surgery

## 2016-02-09 ENCOUNTER — Ambulatory Visit (INDEPENDENT_AMBULATORY_CARE_PROVIDER_SITE_OTHER): Payer: BLUE CROSS/BLUE SHIELD | Admitting: Orthopaedic Surgery

## 2016-02-09 VITALS — BP 140/86 | HR 89 | Temp 97.7°F | Ht 68.0 in | Wt 230.2 lb

## 2016-02-09 DIAGNOSIS — M25511 Pain in right shoulder: Secondary | ICD-10-CM | POA: Diagnosis not present

## 2016-02-09 MED ORDER — HYDROCODONE-ACETAMINOPHEN 5-325 MG PO TABS: 1.0000 | ORAL_TABLET | ORAL | Status: DC | PRN

## 2016-02-09 NOTE — Progress Notes (Signed)
Patient MW:NUUVO:Tina Pham, female DOB:04/25/1976, 40 y.o. ZDG:[id number]  Chief Complaint  Patient presents with  . Follow-up    Right shoulder    HPI  Tina Pham is a 40 y.o. female who has had left shoulder pain in the past.  That is doing better.  She has now right shoulder pain.  She has pain with overhead use. She has no trauma,no paresthesias. She has been using ice and heat and rubs with slight help.    HPI  Body mass index is 35.01 kg/(m^2).  ROS  Review of Systems  Constitutional:       Patient has Diabetes Mellitus. Patient does not have hypertension. Patient does not have COPD or shortness of breath. Patient has BMI > 35. Patient has current smoking history.  HENT: Negative for congestion.   Respiratory: Negative for cough and shortness of breath.   Cardiovascular: Negative for chest pain.  Endocrine: Negative for cold intolerance.  Musculoskeletal: Positive for myalgias, back pain and arthralgias.  Allergic/Immunologic: Negative for environmental allergies.  Psychiatric/Behavioral: The patient is nervous/anxious.     Past Medical History  Diagnosis Date  . Diabetes mellitus   . Depression   . Anxiety   . Hypertension   . Migraine   . High cholesterol   . Plantar fasciitis   . Neuropathic pain of foot   . Chronic back pain   . Sciatica     Past Surgical History  Procedure Laterality Date  . Back surgery    . Eye surgery    . Tubal ligation      Family History  Problem Relation Age of Onset  . Heart failure Mother   . COPD Mother   . Diabetes Mother   . Heart failure Father   . Diabetes Other     Social History Social History  Substance Use Topics  . Smoking status: Current Every Day Smoker -- 1.00 packs/day for 20 years    Types: Cigarettes  . Smokeless tobacco: Never Used  . Alcohol Use: No     Comment: occ    Allergies  Allergen Reactions  . Codeine Other (See Comments)    Upset Stomach  . Benadryl [Diphenhydramine  Hcl] Palpitations  . Compazine Palpitations    Current Outpatient Prescriptions  Medication Sig Dispense Refill  . albuterol (PROVENTIL HFA;VENTOLIN HFA) 108 (90 BASE) MCG/ACT inhaler Inhale 2 puffs into the lungs every 6 (six) hours as needed for wheezing or shortness of breath. 1 Inhaler 0  . cephALEXin (KEFLEX) 500 MG capsule Take 1 capsule (500 mg total) by mouth 3 (three) times daily. 21 capsule 0  . HYDROcodone-acetaminophen (NORCO/VICODIN) 5-325 MG tablet Take 1 tablet by mouth every 4 (four) hours as needed for moderate pain (Must last 30 days.  Do not take and drive a car or use machinery.). 120 tablet 0  . LORazepam (ATIVAN) 1 MG tablet Take 1 tablet (1 mg total) by mouth every 6 (six) hours as needed (Dizziness). 20 tablet 0  . methocarbamol (ROBAXIN) 500 MG tablet Take 1 tablet (500 mg total) by mouth 4 (four) times daily. 20 tablet 0  . naproxen (NAPROSYN) 500 MG tablet Take 1 tablet (500 mg total) by mouth 2 (two) times daily. 30 tablet 0   No current facility-administered medications for this visit.   Facility-Administered Medications Ordered in Other Visits  Medication Dose Route Frequency Provider Last Rate Last Dose  . ondansetron (ZOFRAN) injection 4 mg  4 mg Intravenous Once Jones SkeneJohn-Adam Bonk, MD  Physical Exam  Blood pressure 140/86, pulse 89, temperature 97.7 F (36.5 C), height  (1.727 m), weight 230 lb 3.2 oz (104.418 kg), last menstrual period 12/24/2015.  Constitutional: overall normal hygiene, normal nutrition, well developed, normal grooming, normal body habitus. Assistive device:none  Musculoskeletal: gait and station Limp none, muscle tone and strength are normal, no tremors or atrophy is present.  .  Neurological: coordination overall normal.  Deep tendon reflex/nerve stretch intact.  Sensation normal.  Cranial nerves II-XII intact.   Skin:   normal overall no scars, lesions, ulcers or rashes. No psoriasis.  Psychiatric: Alert and oriented x  3.  Recent memory intact, remote memory unclear.  Normal mood and affect. Well groomed.  Good eye contact.  Cardiovascular: overall no swelling, no varicosities, no edema bilaterally, normal temperatures of the legs and arms, no clubbing, cyanosis and good capillary refill.  Lymphatic: palpation is normal.  Examination of right Upper Extremity is done.  Inspection:   Overall:  Elbow non-tender without crepitus or defects, forearm non-tender without crepitus or defects, wrist non-tender without crepitus or defects, hand non-tender.    Shoulder: with glenohumeral joint tenderness, without effusion.   Upper arm: without swelling and tenderness   Range of motion:   Overall:  Full range of motion of the elbow, full range of motion of wrist and full range of motion in fingers.   Shoulder:  right  145 degrees forward flexion; 120 degrees abduction; 35 degrees internal rotation, 35 degrees external rotation, 15 degrees extension, 40 degrees adduction.   Stability:   Overall:  Shoulder, elbow and wrist stable   Strength and Tone:   Overall full shoulder muscles strength, full upper arm strength and normal upper arm bulk and tone.  PROCEDURE NOTE:  The patient request injection, verbal consent was obtained.  The right shoulder was prepped appropriately after time out was performed.   Sterile technique was observed and injection of 1 cc of Depo-Medrol 40 mg with several cc's of plain xylocaine. Anesthesia was provided by ethyl chloride and a 20-gauge needle was used to inject the shoulder area. A posterior approach was used.  The injection was tolerated well.  A band aid dressing was applied.  The patient was advised to apply ice later today and tomorrow to the injection sight as needed. The patient has been educated about the nature of the problem(s) and counseled on treatment options.  The patient appeared to understand what I have discussed and is in agreement with it.  Encounter Diagnosis   Name Primary?  . Right shoulder pain Yes    PLAN Call if any problems.  Precautions discussed.  Continue current medications.   Return to clinic 2 months   Instructions for shoulder exercises given.  Electronically Signed Darreld Mclean, MD 7/19/201711:21 PM

## 2016-02-09 NOTE — Patient Instructions (Signed)
Generic Shoulder Exercises  EXERCISES   RANGE OF MOTION (ROM) AND STRETCHING EXERCISES  These exercises may help you when beginning to rehabilitate your injury. Your symptoms may resolve with or without further involvement from your physician, physical therapist or athletic trainer. While completing these exercises, remember:   · Restoring tissue flexibility helps normal motion to return to the joints. This allows healthier, less painful movement and activity.  · An effective stretch should be held for at least 30 seconds.  · A stretch should never be painful. You should only feel a gentle lengthening or release in the stretched tissue.  ROM - Pendulum  · Bend at the waist so that your right / left arm falls away from your body. Support yourself with your opposite hand on a solid surface, such as a table or a countertop.  · Your right / left arm should be perpendicular to the ground. If it is not perpendicular, you need to lean over farther. Relax the muscles in your right / left arm and shoulder as much as possible.  · Gently sway your hips and trunk so they move your right / left arm without any use of your right / left shoulder muscles.  · Progress your movements so that your right / left arm moves side to side, then forward and backward, and finally, both clockwise and counterclockwise.  · Complete __________ repetitions in each direction. Many people use this exercise to relieve discomfort in their shoulder as well as to gain range of motion.  Repeat __________ times. Complete this exercise __________ times per day.  STRETCH - Flexion, Standing  · Stand with good posture. With an underhand grip on your right / left hand and an overhand grip on the opposite hand, grasp a broomstick or cane so that your hands are a little more than shoulder-width apart.  · Keeping your right / left elbow straight and shoulder muscles relaxed, push the stick with your opposite hand to raise your right / left arm in front of your  body and then overhead. Raise your arm until you feel a stretch in your right / left shoulder, but before you have increased shoulder pain.  · Try to avoid shrugging your right / left shoulder as your arm rises by keeping your shoulder blade tucked down and toward your mid-back spine. Hold __________ seconds.  · Slowly return to the starting position.  Repeat __________ times. Complete this exercise __________ times per day.  STRETCH - Internal Rotation  · Place your right / left hand behind your back, palm-up.  · Throw a towel or belt over your opposite shoulder. Grasp the towel/belt with your right / left hand.  · While keeping an upright posture, gently pull up on the towel/belt until you feel a stretch in the front of your right / left shoulder.  · Avoid shrugging your right / left shoulder as your arm rises by keeping your shoulder blade tucked down and toward your mid-back spine.  · Hold __________. Release the stretch by lowering your opposite hand.  Repeat __________ times. Complete this exercise __________ times per day.  STRETCH - External Rotation and Abduction  · Stagger your stance through a doorframe. It does not matter which foot is forward.  · As instructed by your physician, physical therapist or athletic trainer, place your hands:    And forearms above your head and on the door frame.    And forearms at head-height and on the door frame.      At elbow-height and on the door frame.  · Keeping your head and chest upright and your stomach muscles tight to prevent over-extending your low-back, slowly shift your weight onto your front foot until you feel a stretch across your chest and/or in the front of your shoulders.  · Hold __________ seconds. Shift your weight to your back foot to release the stretch.  Repeat __________ times. Complete this stretch __________ times per day.   STRENGTHENING EXERCISES   These exercises may help you when beginning to rehabilitate your injury. They may resolve your  symptoms with or without further involvement from your physician, physical therapist or athletic trainer. While completing these exercises, remember:   · Muscles can gain both the endurance and the strength needed for everyday activities through controlled exercises.  · Complete these exercises as instructed by your physician, physical therapist or athletic trainer. Progress the resistance and repetitions only as guided.  · You may experience muscle soreness or fatigue, but the pain or discomfort you are trying to eliminate should never worsen during these exercises. If this pain does worsen, stop and make certain you are following the directions exactly. If the pain is still present after adjustments, discontinue the exercise until you can discuss the trouble with your clinician.  · If advised by your physician, during your recovery, avoid activity or exercises which involve actions that place your right / left hand or elbow above your head or behind your back or head. These positions stress the tissues which are trying to heal.  STRENGTH - Scapular Depression and Adduction  · With good posture, sit on a firm chair. Supported your arms in front of you with pillows, arm rests or a table top. Have your elbows in line with the sides of your body.  · Gently draw your shoulder blades down and toward your mid-back spine. Gradually increase the tension without tensing the muscles along the top of your shoulders and the back of your neck.  · Hold for __________ seconds. Slowly release the tension and relax your muscles completely before completing the next repetition.  · After you have practiced this exercise, remove the arm support and complete it in standing as well as sitting.  Repeat __________ times. Complete this exercise __________ times per day.   STRENGTH - External Rotators  · Secure a rubber exercise band/tubing to a fixed object so that it is at the same height as your right / left elbow when you are standing  or sitting on a firm surface.  · Stand or sit so that the secured exercise band/tubing is at your side that is not injured.  · Bend your elbow 90 degrees. Place a folded towel or small pillow under your right / left arm so that your elbow is a few inches away from your side.  · Keeping the tension on the exercise band/tubing, pull it away from your body, as if pivoting on your elbow. Be sure to keep your body steady so that the movement is only coming from your shoulder rotating.  · Hold __________ seconds. Release the tension in a controlled manner as you return to the starting position.  Repeat __________ times. Complete this exercise __________ times per day.   STRENGTH - Supraspinatus  · Stand or sit with good posture. Grasp a __________ weight or an exercise band/tubing so that your hand is "thumbs-up," like when you shake hands.  · Slowly lift your right / left hand from your thigh into the air,   traveling about 30 degrees from straight out at your side. Lift your hand to shoulder height or as far as you can without increasing any shoulder pain. Initially, many people do not lift their hands above shoulder height.  · Avoid shrugging your right / left shoulder as your arm rises by keeping your shoulder blade tucked down and toward your mid-back spine.  · Hold for __________ seconds. Control the descent of your hand as you slowly return to your starting position.  Repeat __________ times. Complete this exercise __________ times per day.   STRENGTH - Shoulder Extensors  · Secure a rubber exercise band/tubing so that it is at the height of your shoulders when you are either standing or sitting on a firm arm-less chair.  · With a thumbs-up grip, grasp an end of the band/tubing in each hand. Straighten your elbows and lift your hands straight in front of you at shoulder height. Step back away from the secured end of band/tubing until it becomes tense.  · Squeezing your shoulder blades together, pull your hands down  to the sides of your thighs. Do not allow your hands to go behind you.  · Hold for __________ seconds. Slowly ease the tension on the band/tubing as you reverse the directions and return to the starting position.  Repeat __________ times. Complete this exercise __________ times per day.   STRENGTH - Scapular Retractors  · Secure a rubber exercise band/tubing so that it is at the height of your shoulders when you are either standing or sitting on a firm arm-less chair.  · With a palm-down grip, grasp an end of the band/tubing in each hand. Straighten your elbows and lift your hands straight in front of you at shoulder height. Step back away from the secured end of band/tubing until it becomes tense.  · Squeezing your shoulder blades together, draw your elbows back as you bend them. Keep your upper arm lifted away from your body throughout the exercise.  · Hold __________ seconds. Slowly ease the tension on the band/tubing as you reverse the directions and return to the starting position.  Repeat __________ times. Complete this exercise __________ times per day.  STRENGTH - Scapular Depressors  · Find a sturdy chair without wheels, such as a from a dining room table.  · Keeping your feet on the floor, lift your bottom from the seat and lock your elbows.  · Keeping your elbows straight, allow gravity to pull your body weight down. Your shoulders will rise toward your ears.  · Raise your body against gravity by drawing your shoulder blades down your back, shortening the distance between your shoulders and ears. Although your feet should always maintain contact with the floor, your feet should progressively support less body weight as you get stronger.  · Hold __________ seconds. In a controlled and slow manner, lower your body weight to begin the next repetition.  Repeat __________ times. Complete this exercise __________ times per day.      This information is not intended to replace advice given to you by your health  care provider. Make sure you discuss any questions you have with your health care provider.     Document Released: 05/24/2005 Document Revised: 07/31/2014 Document Reviewed: 10/22/2008  [employer]vier Interactive Patient Education ©2016 [employer]

## 2016-03-08 ENCOUNTER — Telehealth: Payer: Self-pay | Admitting: Orthopaedic Surgery

## 2016-03-08 MED ORDER — HYDROCODONE-ACETAMINOPHEN 5-325 MG PO TABS: 1.0000 | ORAL_TABLET | ORAL | 0 refills | Status: DC | PRN

## 2016-03-08 NOTE — Telephone Encounter (Signed)
Patient requested a refill on Hydrocodone/Acetaminophen 5-325  mgs.Tina Pham.   Qty  120    Sig: Take 1 tablet by mouth every 4 (four) hours as needed for moderate pain (Must last 30 days. Do not take and drive a car or use machinery.).

## 2016-04-11 ENCOUNTER — Encounter: Payer: Self-pay | Admitting: Orthopaedic Surgery

## 2016-04-11 ENCOUNTER — Ambulatory Visit (INDEPENDENT_AMBULATORY_CARE_PROVIDER_SITE_OTHER): Payer: BLUE CROSS/BLUE SHIELD | Admitting: Orthopaedic Surgery

## 2016-04-11 ENCOUNTER — Ambulatory Visit (INDEPENDENT_AMBULATORY_CARE_PROVIDER_SITE_OTHER): Payer: BLUE CROSS/BLUE SHIELD

## 2016-04-11 VITALS — BP 136/83 | HR 87 | Temp 97.3°F | Ht 68.0 in | Wt 239.0 lb

## 2016-04-11 DIAGNOSIS — M25512 Pain in left shoulder: Secondary | ICD-10-CM

## 2016-04-11 DIAGNOSIS — M25511 Pain in right shoulder: Secondary | ICD-10-CM

## 2016-04-11 DIAGNOSIS — M542 Cervicalgia: Secondary | ICD-10-CM

## 2016-04-11 MED ORDER — HYDROCODONE-ACETAMINOPHEN 5-325 MG PO TABS: 1.0000 | ORAL_TABLET | Freq: Four times a day (QID) | ORAL | 0 refills | Status: DC | PRN

## 2016-04-11 NOTE — Progress Notes (Signed)
Patient NW:GNFAO:Tina Pham, female DOB:06/09/1976, 40 y.o. ZHY:[id number]  Chief Complaint  Patient presents with  . Follow-up    right shoulder pain    HPI  Tina Pham is a 40 y.o. female who has bilateral shoulder pain.  The injection last month helped the right shoulder.  She has less pain there now.  She has no new trauma.  She has developed neck pain, more on the right.  For two separate occassions she has had a day or two of pain in the neck with rotation to the right. She has no paresthesias. She has no trauma.  She is taking her medicine and doing her exercises for the shoulders.   HPI  Body mass index is 36.34 kg/m.  ROS  Review of Systems  Constitutional:       Patient has Diabetes Mellitus. Patient does not have hypertension. Patient does not have COPD or shortness of breath. Patient has BMI > 35. Patient has current smoking history.  HENT: Negative for congestion.   Respiratory: Negative for cough and shortness of breath.   Cardiovascular: Negative for chest pain.  Endocrine: Negative for cold intolerance.  Musculoskeletal: Positive for arthralgias, back pain and myalgias.  Allergic/Immunologic: Negative for environmental allergies.  Psychiatric/Behavioral: The patient is nervous/anxious.     Past Medical History:  Diagnosis Date  . Anxiety   . Chronic back pain   . Depression   . Diabetes mellitus   . High cholesterol   . Hypertension   . Migraine   . Neuropathic pain of foot   . Plantar fasciitis   . Sciatica     Past Surgical History:  Procedure Laterality Date  . BACK SURGERY    . EYE SURGERY    . TUBAL LIGATION      Family History  Problem Relation Age of Onset  . Heart failure Mother   . COPD Mother   . Diabetes Mother   . Heart failure Father   . Diabetes Other     Social History Social History  Substance Use Topics  . Smoking status: Current Every Day Smoker    Packs/day: 1.00    Years: 20.00    Types: Cigarettes   . Smokeless tobacco: Never Used  . Alcohol use No     Comment: occ    Allergies  Allergen Reactions  . Codeine Other (See Comments)    Upset Stomach  . Benadryl [Diphenhydramine Hcl] Palpitations  . Compazine Palpitations    Current Outpatient Prescriptions  Medication Sig Dispense Refill  . albuterol (PROVENTIL HFA;VENTOLIN HFA) 108 (90 BASE) MCG/ACT inhaler Inhale 2 puffs into the lungs every 6 (six) hours as needed for wheezing or shortness of breath. 1 Inhaler 0  . cephALEXin (KEFLEX) 500 MG capsule Take 1 capsule (500 mg total) by mouth 3 (three) times daily. 21 capsule 0  . HYDROcodone-acetaminophen (NORCO/VICODIN) 5-325 MG tablet Take 1 tablet by mouth every 6 (six) hours as needed for moderate pain (Must last 30 days.Do not take and drive a car or use machinery.). 120 tablet 0  . LORazepam (ATIVAN) 1 MG tablet Take 1 tablet (1 mg total) by mouth every 6 (six) hours as needed (Dizziness). 20 tablet 0  . methocarbamol (ROBAXIN) 500 MG tablet Take 1 tablet (500 mg total) by mouth 4 (four) times daily. 20 tablet 0  . naproxen (NAPROSYN) 500 MG tablet Take 1 tablet (500 mg total) by mouth 2 (two) times daily. 30 tablet 0  . naproxen sodium (ANAPROX) 220  MG tablet Take 220 mg by mouth as needed.     No current facility-administered medications for this visit.    Facility-Administered Medications Ordered in Other Visits  Medication Dose Route Frequency Provider Last Rate Last Dose  . ondansetron (ZOFRAN) injection 4 mg  4 mg Intravenous Once John-Adam Bonk, MD         Physical Exam  Blood pressure 136/83, pulse 87, temperature 97.3 F (36.3 C), height 5\' 8"  (1.727 m), weight 239 lb (108.4 kg), last menstrual period 03/21/2016.  Constitutional: overall normal hygiene, normal nutrition, well developed, normal grooming, normal body habitus. Assistive device:none  Musculoskeletal: gait and station Limp none, muscle tone and strength are normal, no tremors or atrophy is  present.  .  Neurological: coordination overall normal.  Deep tendon reflex/nerve stretch intact.  Sensation normal.  Cranial nerves II-XII intact.   Skin:   Normal overall no scars, lesions, ulcers or rashes. No psoriasis.  Psychiatric: Alert and oriented x 3.  Recent memory intact, remote memory unclear.  Normal mood and affect. Well groomed.  Good eye contact.  Cardiovascular: overall no swelling, no varicosities, no edema bilaterally, normal temperatures of the legs and arms, no clubbing, cyanosis and good capillary refill.  Lymphatic: palpation is normal.  Neck has full motion but some pain when moving to the right. She has no spasm.  NV intact.  Examination of bilaterally Upper Extremity is done.  Inspection:   Overall:  Elbow non-tender without crepitus or defects, forearm non-tender without crepitus or defects, wrist non-tender without crepitus or defects, hand non-tender.    Shoulder: with glenohumeral joint tenderness, without effusion.   Upper arm: without swelling and tenderness   Range of motion:   Overall:  Full range of motion of the elbow, full range of motion of wrist and full range of motion in fingers.   Shoulder:  bilaterally  180 degrees forward flexion; 165 degrees abduction; 40 degrees internal rotation, 40 degrees external rotation, 20 degrees extension, 40 degrees adduction.   Stability:   Overall:  Shoulder, elbow and wrist stable   Strength and Tone:   Overall full shoulder muscles strength, full upper arm strength and normal upper arm bulk and tone.   X-rays were done of the neck, reported separately.  The patient has been educated about the nature of the problem(s) and counseled on treatment options.  The patient appeared to understand what I have discussed and is in agreement with it.  Encounter Diagnoses  Name Primary?  . Cervicalgia Yes  . Right shoulder pain   . Left shoulder pain     PLAN Call if any problems.  Precautions discussed.   Continue current medications.   Return to clinic 1 month   Electronically Signed Darreld Mclean, MD 9/19/201711:12 AM

## 2016-05-09 ENCOUNTER — Encounter: Payer: Self-pay | Admitting: Orthopaedic Surgery

## 2016-05-09 ENCOUNTER — Ambulatory Visit (INDEPENDENT_AMBULATORY_CARE_PROVIDER_SITE_OTHER): Payer: BLUE CROSS/BLUE SHIELD | Admitting: Orthopaedic Surgery

## 2016-05-09 VITALS — BP 140/74 | HR 83 | Ht 68.0 in | Wt 231.6 lb

## 2016-05-09 DIAGNOSIS — F17210 Nicotine dependence, cigarettes, uncomplicated: Secondary | ICD-10-CM | POA: Diagnosis not present

## 2016-05-09 DIAGNOSIS — Z720 Tobacco use: Secondary | ICD-10-CM

## 2016-05-09 DIAGNOSIS — M25511 Pain in right shoulder: Secondary | ICD-10-CM

## 2016-05-09 DIAGNOSIS — G8929 Other chronic pain: Secondary | ICD-10-CM

## 2016-05-09 MED ORDER — HYDROCODONE-ACETAMINOPHEN 5-325 MG PO TABS: 1.0000 | ORAL_TABLET | Freq: Four times a day (QID) | ORAL | 0 refills | Status: DC | PRN

## 2016-05-09 NOTE — Progress Notes (Signed)
Patient WU:Tina Pham:Tina Pham, female DOB:02/01/1976, 40 y.o. JYN:[id number]  Chief Complaint  Patient presents with  . Follow-up    Right shoulder    HPI  Tina Pham is a 40 y.o. female who has chronic pain of the right shoulder. She has been doing her exercises. She has no new trauma, no paresthesias. She is taking her medicine.  She is stable. HPI  Body mass index is 35.21 kg/m.  ROS  Review of Systems  Constitutional:       Patient has Diabetes Mellitus. Patient does not have hypertension. Patient does not have COPD or shortness of breath. Patient has BMI > 35. Patient has current smoking history.  HENT: Negative for congestion.   Respiratory: Negative for cough and shortness of breath.   Cardiovascular: Negative for chest pain.  Endocrine: Negative for cold intolerance.  Musculoskeletal: Positive for arthralgias, back pain and myalgias.  Allergic/Immunologic: Negative for environmental allergies.  Psychiatric/Behavioral: The patient is nervous/anxious.     Past Medical History:  Diagnosis Date  . Anxiety   . Chronic back pain   . Depression   . Diabetes mellitus   . High cholesterol   . Hypertension   . Migraine   . Neuropathic pain of foot   . Plantar fasciitis   . Sciatica     Past Surgical History:  Procedure Laterality Date  . BACK SURGERY    . EYE SURGERY    . TUBAL LIGATION      Family History  Problem Relation Age of Onset  . Heart failure Mother   . COPD Mother   . Diabetes Mother   . Heart failure Father   . Diabetes Other     Social History Social History  Substance Use Topics  . Smoking status: Current Every Day Smoker    Packs/day: 1.00    Years: 20.00    Types: Cigarettes  . Smokeless tobacco: Never Used  . Alcohol use No     Comment: occ    Allergies  Allergen Reactions  . Codeine Other (See Comments)    Upset Stomach  . Benadryl [Diphenhydramine Hcl] Palpitations  . Compazine Palpitations    Current  Outpatient Prescriptions  Medication Sig Dispense Refill  . albuterol (PROVENTIL HFA;VENTOLIN HFA) 108 (90 BASE) MCG/ACT inhaler Inhale 2 puffs into the lungs every 6 (six) hours as needed for wheezing or shortness of breath. 1 Inhaler 0  . cephALEXin (KEFLEX) 500 MG capsule Take 1 capsule (500 mg total) by mouth 3 (three) times daily. 21 capsule 0  . HYDROcodone-acetaminophen (NORCO/VICODIN) 5-325 MG tablet Take 1 tablet by mouth every 6 (six) hours as needed for moderate pain (Must last 30 days.Do not take and drive a car or use machinery.). 110 tablet 0  . LORazepam (ATIVAN) 1 MG tablet Take 1 tablet (1 mg total) by mouth every 6 (six) hours as needed (Dizziness). 20 tablet 0  . methocarbamol (ROBAXIN) 500 MG tablet Take 1 tablet (500 mg total) by mouth 4 (four) times daily. 20 tablet 0  . naproxen (NAPROSYN) 500 MG tablet Take 1 tablet (500 mg total) by mouth 2 (two) times daily. 30 tablet 0  . naproxen sodium (ANAPROX) 220 MG tablet Take 220 mg by mouth as needed.     No current facility-administered medications for this visit.    Facility-Administered Medications Ordered in Other Visits  Medication Dose Route Frequency Provider Last Rate Last Dose  . ondansetron (ZOFRAN) injection 4 mg  4 mg Intravenous Once John-Adam Bonk,  MD         Physical Exam  Blood pressure 140/74, pulse 83, height 5\' 8"  (1.727 m), weight 231 lb 9.6 oz (105.1 kg), last menstrual period 03/21/2016.  Constitutional: overall normal hygiene, normal nutrition, well developed, normal grooming, normal body habitus. Assistive device:none  Musculoskeletal: gait and station Limp none, muscle tone and strength are normal, no tremors or atrophy is present.  .  Neurological: coordination overall normal.  Deep tendon reflex/nerve stretch intact.  Sensation normal.  Cranial nerves II-XII intact.   Skin:   Normal overall no scars, lesions, ulcers or rashes. No psoriasis.  Psychiatric: Alert and oriented x 3.  Recent  memory intact, remote memory unclear.  Normal mood and affect. Well groomed.  Good eye contact.  Cardiovascular: overall no swelling, no varicosities, no edema bilaterally, normal temperatures of the legs and arms, no clubbing, cyanosis and good capillary refill.  Lymphatic: palpation is normal.  Examination of right Upper Extremity is done.  Inspection:   Overall:  Elbow non-tender without crepitus or defects, forearm non-tender without crepitus or defects, wrist non-tender without crepitus or defects, hand non-tender.    Shoulder: with glenohumeral joint tenderness, without effusion.   Upper arm: without swelling and tenderness   Range of motion:   Overall:  Full range of motion of the elbow, full range of motion of wrist and full range of motion in fingers.   Shoulder:  right  full degrees forward flexion; full degrees abduction; full degrees internal rotation, full degrees external rotation, full degrees extension, full degrees adduction. Pain in the extremes of motion.   Stability:   Overall:  Shoulder, elbow and wrist stable   Strength and Tone:   Overall full shoulder muscles strength, full upper arm strength and normal upper arm bulk and tone.   The patient has been educated about the nature of the problem(s) and counseled on treatment options.  The patient appeared to understand what I have discussed and is in agreement with it.  Encounter Diagnoses  Name Primary?  . Chronic right shoulder pain Yes  . Cigarette nicotine dependence without complication     PLAN Call if any problems.  Precautions discussed.  Continue current medications.   Return to clinic 1 month   Electronically Signed Darreld Mclean, MD 10/17/201711:09 AM

## 2016-06-06 ENCOUNTER — Other Ambulatory Visit: Payer: Self-pay | Admitting: Orthopaedic Surgery

## 2016-06-06 MED ORDER — HYDROCODONE-ACETAMINOPHEN 5-325 MG PO TABS: 1.0000 | ORAL_TABLET | Freq: Four times a day (QID) | ORAL | 0 refills | Status: DC | PRN

## 2016-06-12 ENCOUNTER — Emergency Department (HOSPITAL_COMMUNITY)
Admission: EM | Admit: 2016-06-12 | Discharge: 2016-06-12 | Disposition: A | Discharge status: Home / Self Care | Payer: BLUE CROSS/BLUE SHIELD | Attending: Emergency Medicine | Admitting: Emergency Medicine

## 2016-06-12 ENCOUNTER — Emergency Department (HOSPITAL_COMMUNITY): Payer: BLUE CROSS/BLUE SHIELD

## 2016-06-12 ENCOUNTER — Encounter (HOSPITAL_COMMUNITY): Payer: Self-pay | Admitting: Emergency Medicine

## 2016-06-12 DIAGNOSIS — I10 Essential (primary) hypertension: Secondary | ICD-10-CM | POA: Diagnosis not present

## 2016-06-12 DIAGNOSIS — F17210 Nicotine dependence, cigarettes, uncomplicated: Secondary | ICD-10-CM | POA: Insufficient documentation

## 2016-06-12 DIAGNOSIS — Z7984 Long term (current) use of oral hypoglycemic drugs: Secondary | ICD-10-CM | POA: Insufficient documentation

## 2016-06-12 DIAGNOSIS — E119 Type 2 diabetes mellitus without complications: Secondary | ICD-10-CM | POA: Diagnosis not present

## 2016-06-12 DIAGNOSIS — R05 Cough: Secondary | ICD-10-CM | POA: Diagnosis present

## 2016-06-12 DIAGNOSIS — Z79899 Other long term (current) drug therapy: Secondary | ICD-10-CM | POA: Insufficient documentation

## 2016-06-12 DIAGNOSIS — J209 Acute bronchitis, unspecified: Secondary | ICD-10-CM | POA: Diagnosis not present

## 2016-06-12 MED ORDER — BENZONATATE 100 MG PO CAPS: 200.0000 mg | ORAL_CAPSULE | Freq: Three times a day (TID) | ORAL | 0 refills | Status: DC | PRN

## 2016-06-12 MED ORDER — IPRATROPIUM-ALBUTEROL 0.5-2.5 (3) MG/3ML IN SOLN
3.0000 mL | Freq: Once | RESPIRATORY_TRACT | Status: AC
  Administered 2016-06-12: 3 mL via RESPIRATORY_TRACT
  Filled 2016-06-12: qty 3

## 2016-06-12 MED ORDER — ALBUTEROL SULFATE HFA 108 (90 BASE) MCG/ACT IN AERS: 1.0000 | INHALATION_SPRAY | Freq: Four times a day (QID) | RESPIRATORY_TRACT | 0 refills | Status: DC | PRN

## 2016-06-12 MED ORDER — AZITHROMYCIN 250 MG PO TABS: ORAL_TABLET | ORAL | 0 refills | Status: DC

## 2016-06-12 MED ORDER — PREDNISONE 50 MG PO TABS: 50.0000 mg | ORAL_TABLET | Freq: Every day | ORAL | 0 refills | Status: DC

## 2016-06-12 MED ORDER — ALBUTEROL SULFATE (2.5 MG/3ML) 0.083% IN NEBU
2.5000 mg | INHALATION_SOLUTION | Freq: Once | RESPIRATORY_TRACT | Status: AC
  Administered 2016-06-12: 2.5 mg via RESPIRATORY_TRACT
  Filled 2016-06-12: qty 3

## 2016-06-12 NOTE — ED Provider Notes (Signed)
[facility]Y DEPT Provider Note    By signing my name below, I, Earmon PhoenixJennifer Waddell, attest that this documentation has been prepared under the direction and in the presence of Burgess AmorJulie Karema Tocci, PA-C. Electronically Signed: Earmon PhoenixJennifer Waddell, ED Scribe. 06/12/16. 12:12 PM.    History   Chief Complaint Chief Complaint  Patient presents with  . Cough  . Nasal Congestion   The history is provided by the patient and medical records. No language interpreter was used.    HPI Comments:  Tina Pham is an obese 40 y.o. female who presents to the Emergency Department complaining of URI symptoms that began six days ago. She reports a dry, nonproductive cough, clear rhinorrhea and nasal congestion, wheezing, sore throat and subjective fever. She reports associated fullness in her ears. Pt reports some nausea last night. She has taken NyQuil to treat her symptoms with no significant relief. She denies modifying factors. She denies vomiting, otalgia. She is a smoker of a little more than 1/2 PPD. She did not have a flu vaccination this year. Pt reports allergies to Codeine, Compazine and Benadryl together.   Past Medical History:  Diagnosis Date  . Anxiety   . Chronic back pain   . Depression   . Diabetes mellitus   . High cholesterol   . Hypertension   . Migraine   . Neuropathic pain of foot   . Plantar fasciitis   . Sciatica     Patient Active Problem List   Diagnosis Date Noted  . Left shoulder pain 09/21/2015  . Disorders of bursae and tendons in shoulder region, unspecified 09/25/2013    Past Surgical History:  Procedure Laterality Date  . BACK SURGERY    . EYE SURGERY    . TUBAL LIGATION      OB History    Gravida Para Term Preterm AB Living   3 3 3     3    SAB TAB Ectopic Multiple Live Births                   Home Medications    Prior to Admission medications   Medication Sig Start Date End Date Taking? Authorizing Provider  albuterol (PROVENTIL  HFA;VENTOLIN HFA) 108 (90 Base) MCG/ACT inhaler Inhale 1-2 puffs into the lungs every 6 (six) hours as needed for wheezing or shortness of breath. 06/12/16   Burgess AmorJulie Alea Ryer, PA-C  azithromycin (ZITHROMAX Z-PAK) 250 MG tablet Take 2 tablets by mouth on day one followed by one tablet daily for 4 days. 06/12/16   Burgess AmorJulie Nathaniel Wakeley, PA-C  benzonatate (TESSALON) 100 MG capsule Take 2 capsules (200 mg total) by mouth 3 (three) times daily as needed. 06/12/16   Burgess AmorJulie Jessyka Austria, PA-C  cephALEXin (KEFLEX) 500 MG capsule Take 1 capsule (500 mg total) by mouth 3 (three) times daily. 11/22/15   Hope Orlene OchM Neese, NP  HYDROcodone-acetaminophen (NORCO/VICODIN) 5-325 MG tablet Take 1 tablet by mouth every 6 (six) hours as needed for moderate pain (Must last 30 days.Do not take and drive a car or use machinery.). 06/06/16   Vickki HearingStanley E Harrison, MD  LORazepam (ATIVAN) 1 MG tablet Take 1 tablet (1 mg total) by mouth every 6 (six) hours as needed (Dizziness). 04/19/15   Mancel BaleElliott Wentz, MD  methocarbamol (ROBAXIN) 500 MG tablet Take 1 tablet (500 mg total) by mouth 4 (four) times daily. 01/30/16   Elson AreasLeslie K Sofia, PA-C  naproxen (NAPROSYN) 500 MG tablet Take 1 tablet (500 mg total) by mouth 2 (two) times daily. 11/22/15  Hope Orlene OchM Neese, NP  naproxen sodium (ANAPROX) 220 MG tablet Take 220 mg by mouth as needed.    Historical Provider, MD  predniSONE (DELTASONE) 50 MG tablet Take 1 tablet (50 mg total) by mouth daily with breakfast. 06/12/16   Burgess AmorJulie Stevenson Windmiller, PA-C    Family History Family History  Problem Relation Age of Onset  . Heart failure Mother   . COPD Mother   . Diabetes Mother   . Heart failure Father   . Diabetes Other     Social History Social History  Substance Use Topics  . Smoking status: Current Every Day Smoker    Packs/day: 0.50    Years: 20.00    Types: Cigarettes  . Smokeless tobacco: Never Used  . Alcohol use No     Comment: occ     Allergies   Codeine; Benadryl [diphenhydramine hcl]; and Compazine   Review of  Systems Review of Systems  Constitutional: Positive for fever (subjective).  HENT: Positive for congestion, rhinorrhea and sore throat. Negative for ear pain, sinus pressure, trouble swallowing and voice change.   Eyes: Negative for discharge.  Respiratory: Positive for cough and wheezing. Negative for shortness of breath and stridor.   Cardiovascular: Negative for chest pain.  Gastrointestinal: Positive for nausea. Negative for abdominal pain and vomiting.  Genitourinary: Negative.      Physical Exam Updated Vital Signs BP 116/78 (BP Location: Left Arm)   Pulse 95   Temp 98.6 F (37 C) (Oral)   Resp 20   Ht 5\' 8"  (1.727 m)   Wt 238 lb (108 kg)   LMP 04/28/2016   SpO2 100%   BMI 36.19 kg/m   Physical Exam  Constitutional: She is oriented to person, place, and time. She appears well-developed and well-nourished.  HENT:  Head: Normocephalic and atraumatic.  Right Ear: Tympanic membrane and ear canal normal.  Left Ear: Tympanic membrane and ear canal normal.  Mouth/Throat: Uvula is midline, oropharynx is clear and moist and mucous membranes are normal. No oropharyngeal exudate, posterior oropharyngeal edema, posterior oropharyngeal erythema or tonsillar abscesses.  Edentulous.  Eyes: Conjunctivae are normal.  Cardiovascular: Normal rate and normal heart sounds.   Pulmonary/Chest: Effort normal. No respiratory distress. She has no decreased breath sounds. She has wheezes. She has no rales.  Expiratory wheezes throughout all lung fields with prolonged expirations.  Abdominal: Soft. There is no tenderness.  Musculoskeletal: Normal range of motion.  Neurological: She is alert and oriented to person, place, and time.  Skin: Skin is warm and dry. No rash noted.  Psychiatric: She has a normal mood and affect.  Nursing note and vitals reviewed.    ED Treatments / Results  DIAGNOSTIC STUDIES: Oxygen Saturation is 100% on RA, normal by my interpretation.   COORDINATION OF  CARE: 12:07 PM- Will order nebulizer treatment and prescribe MDI. Pt verbalizes understanding and agrees to plan.  Medications  ipratropium-albuterol (DUONEB) 0.5-2.5 (3) MG/3ML nebulizer solution 3 mL (3 mLs Nebulization Given 06/12/16 1244)  albuterol (PROVENTIL) (2.5 MG/3ML) 0.083% nebulizer solution 2.5 mg (2.5 mg Nebulization Given 06/12/16 1248)    Labs (all labs ordered are listed, but only abnormal results are displayed) Labs Reviewed - No data to display  EKG  EKG Interpretation None       Radiology Dg Chest 2 View  Result Date: 06/12/2016 CLINICAL DATA:  Nonproductive cough, headache, body aches EXAM: CHEST  2 VIEW COMPARISON:  New 07/22/2014 FINDINGS: The heart size and mediastinal contours are within normal limits.  Both lungs are clear. The visualized skeletal structures are unremarkable. IMPRESSION: No active cardiopulmonary disease. Electronically Signed   By: Elige Ko   On: 06/12/2016 10:24    Procedures Procedures (including critical care time)  Medications Ordered in ED Medications  ipratropium-albuterol (DUONEB) 0.5-2.5 (3) MG/3ML nebulizer solution 3 mL (3 mLs Nebulization Given 06/12/16 1244)  albuterol (PROVENTIL) (2.5 MG/3ML) 0.083% nebulizer solution 2.5 mg (2.5 mg Nebulization Given 06/12/16 1248)     Initial Impression / Assessment and Plan / ED Course  I have reviewed the triage vital signs and the nursing notes.  Pertinent labs & imaging results that were available during my care of the patient were reviewed by me and considered in my medical decision making (see chart for details).  Clinical Course     Albuterol neb given with resolution of wheezing. Advised continued home use of albuterol.  zithromax and prednisone pulse dosing prescribed, tessalon for cough sx.  Rest, recheck by pcp for any persistent or return here for worsened sx.  Advised smoking cessation  The patient appears reasonably screened and/or stabilized for discharge and I  doubt any other medical condition or other [facility] requiring further screening, evaluation, or treatment in the ED at this time prior to discharge.   I personally performed the services described in this documentation, which was scribed in my presence. The recorded information has been reviewed and is accurate.   Final Clinical Impressions(s) / ED Diagnoses   Final diagnoses:  Acute bronchitis, unspecified organism    New Prescriptions Discharge Medication List as of 06/12/2016  1:59 PM    START taking these medications   Details  azithromycin (ZITHROMAX Z-PAK) 250 MG tablet Take 2 tablets by mouth on day one followed by one tablet daily for 4 days., Print    benzonatate (TESSALON) 100 MG capsule Take 2 capsules (200 mg total) by mouth 3 (three) times daily as needed., Starting Mon 06/12/2016, Print    predniSONE (DELTASONE) 50 MG tablet Take 1 tablet (50 mg total) by mouth daily with breakfast., Starting Mon 06/12/2016, Print         Burgess Amor, PA-C 06/14/16 9528    Donnetta Hutching, MD 06/20/16 1143

## 2016-06-12 NOTE — ED Notes (Signed)
Resp tx notified of new orders 

## 2016-06-12 NOTE — ED Triage Notes (Signed)
Pt has multiple complaints, cough non productive, sore throat, ear ache, facial pain

## 2016-06-13 ENCOUNTER — Ambulatory Visit: Payer: BLUE CROSS/BLUE SHIELD | Admitting: Orthopaedic Surgery

## 2016-06-13 ENCOUNTER — Encounter: Payer: Self-pay | Admitting: Orthopaedic Surgery

## 2016-07-06 ENCOUNTER — Telehealth: Payer: Self-pay | Admitting: Orthopaedic Surgery

## 2016-07-06 MED ORDER — HYDROCODONE-ACETAMINOPHEN 5-325 MG PO TABS: 1.0000 | ORAL_TABLET | Freq: Four times a day (QID) | ORAL | 0 refills | Status: DC | PRN

## 2016-07-06 NOTE — Telephone Encounter (Signed)
Hydrocodone-Acetaminophen  5/325mg  Qty 110 Tablets °

## 2016-07-12 ENCOUNTER — Ambulatory Visit: Payer: BLUE CROSS/BLUE SHIELD | Admitting: Orthopaedic Surgery

## 2016-07-27 ENCOUNTER — Encounter: Payer: Self-pay | Admitting: Orthopaedic Surgery

## 2016-07-27 ENCOUNTER — Ambulatory Visit (INDEPENDENT_AMBULATORY_CARE_PROVIDER_SITE_OTHER): Payer: BLUE CROSS/BLUE SHIELD | Admitting: Orthopaedic Surgery

## 2016-07-27 VITALS — BP 141/84 | HR 83 | Temp 97.7°F | Ht 68.0 in | Wt 228.0 lb

## 2016-07-27 DIAGNOSIS — M25511 Pain in right shoulder: Secondary | ICD-10-CM

## 2016-07-27 DIAGNOSIS — G8929 Other chronic pain: Secondary | ICD-10-CM | POA: Diagnosis not present

## 2016-07-27 DIAGNOSIS — F17210 Nicotine dependence, cigarettes, uncomplicated: Secondary | ICD-10-CM | POA: Diagnosis not present

## 2016-07-27 NOTE — Progress Notes (Signed)
Patient ZO:XWRUE Tina Pham, female DOB:06-30-1976, 41 y.o. AVW:[id number]  Chief Complaint  Patient presents with  . Shoulder Pain    Right    HPI  Tina Pham is a 41 y.o. female who has chronic pain of the right shoulder.  She is better despite the cold weather which makes it worse.  She is doing her exercises. She has no new trauma or paresthesias. HPI  Body mass index is 34.67 kg/m.  ROS  Review of Systems  Constitutional:       Patient has Diabetes Mellitus. Patient does not have hypertension. Patient does not have COPD or shortness of breath. Patient has BMI > 35. Patient has current smoking history.  HENT: Negative for congestion.   Respiratory: Negative for cough and shortness of breath.   Cardiovascular: Negative for chest pain.  Endocrine: Negative for cold intolerance.  Musculoskeletal: Positive for arthralgias, back pain and myalgias.  Allergic/Immunologic: Negative for environmental allergies.  Psychiatric/Behavioral: The patient is nervous/anxious.     Past Medical History:  Diagnosis Date  . Anxiety   . Chronic back pain   . Depression   . Diabetes mellitus   . High cholesterol   . Hypertension   . Migraine   . Neuropathic pain of foot   . Plantar fasciitis   . Sciatica     Past Surgical History:  Procedure Laterality Date  . BACK SURGERY    . EYE SURGERY    . TUBAL LIGATION      Family History  Problem Relation Age of Onset  . Heart failure Mother   . COPD Mother   . Diabetes Mother   . Heart failure Father   . Diabetes Other     Social History Social History  Substance Use Topics  . Smoking status: Current Every Day Smoker    Packs/day: 0.50    Years: 20.00    Types: Cigarettes  . Smokeless tobacco: Never Used  . Alcohol use No     Comment: occ    Allergies  Allergen Reactions  . Codeine Other (See Comments)    Upset Stomach  . Benadryl [Diphenhydramine Hcl] Palpitations  . Compazine Palpitations    Current  Outpatient Prescriptions  Medication Sig Dispense Refill  . albuterol (PROVENTIL HFA;VENTOLIN HFA) 108 (90 Base) MCG/ACT inhaler Inhale 1-2 puffs into the lungs every 6 (six) hours as needed for wheezing or shortness of breath. 1 Inhaler 0  . azithromycin (ZITHROMAX Z-PAK) 250 MG tablet Take 2 tablets by mouth on day one followed by one tablet daily for 4 days. 6 each 0  . benzonatate (TESSALON) 100 MG capsule Take 2 capsules (200 mg total) by mouth 3 (three) times daily as needed. 30 capsule 0  . cephALEXin (KEFLEX) 500 MG capsule Take 1 capsule (500 mg total) by mouth 3 (three) times daily. 21 capsule 0  . HYDROcodone-acetaminophen (NORCO/VICODIN) 5-325 MG tablet Take 1 tablet by mouth every 6 (six) hours as needed for moderate pain (Must last 30 days.Do not take and drive a car or use machinery.). 100 tablet 0  . LORazepam (ATIVAN) 1 MG tablet Take 1 tablet (1 mg total) by mouth every 6 (six) hours as needed (Dizziness). 20 tablet 0  . methocarbamol (ROBAXIN) 500 MG tablet Take 1 tablet (500 mg total) by mouth 4 (four) times daily. 20 tablet 0  . naproxen (NAPROSYN) 500 MG tablet Take 1 tablet (500 mg total) by mouth 2 (two) times daily. 30 tablet 0  . naproxen sodium (ANAPROX)  220 MG tablet Take 220 mg by mouth as needed.    . predniSONE (DELTASONE) 50 MG tablet Take 1 tablet (50 mg total) by mouth daily with breakfast. 5 tablet 0   No current facility-administered medications for this visit.    Facility-Administered Medications Ordered in Other Visits  Medication Dose Route Frequency Provider Last Rate Last Dose  . ondansetron (ZOFRAN) injection 4 mg  4 mg Intravenous Once Tina Bonk, MD         Physical Exam  Blood pressure (!) 141/84, pulse 83, temperature 97.7 F (36.5 C), height 5\' 8"  (1.727 m), weight 228 lb (103.4 kg).  Constitutional: overall normal hygiene, normal nutrition, well developed, normal grooming, normal body habitus. Assistive device:none  Musculoskeletal:  gait and station Limp none, muscle tone and strength are normal, no tremors or atrophy is present.  .  Neurological: coordination overall normal.  Deep tendon reflex/nerve stretch intact.  Sensation normal.  Cranial nerves II-XII intact.   Skin:   Normal overall no scars, lesions, ulcers or rashes. No psoriasis.  Psychiatric: Alert and oriented x 3.  Recent memory intact, remote memory unclear.  Normal mood and affect. Well groomed.  Good eye contact.  Cardiovascular: overall no swelling, no varicosities, no edema bilaterally, normal temperatures of the legs and arms, no clubbing, cyanosis and good capillary refill.  Lymphatic: palpation is normal.  Examination of right Upper Extremity is done.  Inspection:   Overall:  Elbow non-tender without crepitus or defects, forearm non-tender without crepitus or defects, wrist non-tender without crepitus or defects, hand non-tender.    Shoulder: with glenohumeral joint tenderness, without effusion.   Upper arm: without swelling and tenderness   Range of motion:   Overall:  Full range of motion of the elbow, full range of motion of wrist and full range of motion in fingers.   Shoulder:  right  180 degrees forward flexion; 180 degrees abduction; 35 degrees internal rotation, 35 degrees external rotation, 15 degrees extension, 40 degrees adduction.   Stability:   Overall:  Shoulder, elbow and wrist stable   Strength and Tone:   Overall full shoulder muscles strength, full upper arm strength and normal upper arm bulk and tone.  She continues to smoke and is willing to cut back.  The patient has been educated about the nature of the problem(s) and counseled on treatment options.  The patient appeared to understand what I have discussed and is in agreement with it.  Encounter Diagnoses  Name Primary?  . Chronic right shoulder pain Yes  . Cigarette nicotine dependence without complication     PLAN Call if any problems.  Precautions discussed.   Continue current medications.   Return to clinic 2 months   Electronically Signed Darreld McleanWayne Domani Bakos, MD 1/4/20189:50 AM

## 2016-07-27 NOTE — Patient Instructions (Signed)
Steps to Quit Smoking Smoking tobacco can be bad for your health. It can also affect almost every organ in your body. Smoking puts you and people around you at risk for many serious long-lasting (chronic) diseases. Quitting smoking is hard, but it is one of the best things that you can do for your health. It is never too late to quit. What are the benefits of quitting smoking? When you quit smoking, you lower your risk for getting serious diseases and conditions. They can include:  Lung cancer or lung disease.  Heart disease.  Stroke.  Heart attack.  Not being able to have children (infertility).  Weak bones (osteoporosis) and broken bones (fractures). If you have coughing, wheezing, and shortness of breath, those symptoms may get better when you quit. You may also get sick less often. If you are pregnant, quitting smoking can help to lower your chances of having a baby of low birth weight. What can I do to help me quit smoking? Talk with your doctor about what can help you quit smoking. Some things you can do (strategies) include:  Quitting smoking totally, instead of slowly cutting back how much you smoke over a period of time.  Going to in-person counseling. You are more likely to quit if you go to many counseling sessions.  Using resources and support systems, such as:  Online chats with a counselor.  Phone quitlines.  Printed self-help materials.  Support groups or group counseling.  Text messaging programs.  Mobile phone apps or applications.  Taking medicines. Some of these medicines may have nicotine in them. If you are pregnant or breastfeeding, do not take any medicines to quit smoking unless your doctor says it is okay. Talk with your doctor about counseling or other things that can help you. Talk with your doctor about using more than one strategy at the same time, such as taking medicines while you are also going to in-person counseling. This can help make quitting  easier. What things can I do to make it easier to quit? Quitting smoking might feel very hard at first, but there is a lot that you can do to make it easier. Take these steps:  Talk to your family and friends. Ask them to support and encourage you.  Call phone quitlines, reach out to support groups, or work with a counselor.  Ask people who smoke to not smoke around you.  Avoid places that make you want (trigger) to smoke, such as:  Bars.  Parties.  Smoke-break areas at work.  Spend time with people who do not smoke.  Lower the stress in your life. Stress can make you want to smoke. Try these things to help your stress:  Getting regular exercise.  Deep-breathing exercises.  Yoga.  Meditating.  Doing a body scan. To do this, close your eyes, focus on one area of your body at a time from head to toe, and notice which parts of your body are tense. Try to relax the muscles in those areas.  Download or buy apps on your mobile phone or tablet that can help you stick to your quit plan. There are many free apps, such as QuitGuide from the CDC (Centers for Disease Control and Prevention). You can find more support from smokefree.gov and other websites. This information is not intended to replace advice given to you by your health care provider. Make sure you discuss any questions you have with your health care provider. Document Released: 05/06/2009 Document Revised: 03/07/2016 Document   Reviewed: 11/24/2014 Elsevier Interactive Patient Education  2017 [employer]  

## 2016-08-01 ENCOUNTER — Emergency Department (HOSPITAL_COMMUNITY): Payer: BLUE CROSS/BLUE SHIELD

## 2016-08-01 ENCOUNTER — Encounter (HOSPITAL_COMMUNITY): Payer: Self-pay | Admitting: Emergency Medicine

## 2016-08-01 ENCOUNTER — Emergency Department (HOSPITAL_COMMUNITY)
Admission: EM | Admit: 2016-08-01 | Discharge: 2016-08-01 | Disposition: A | Discharge status: Home / Self Care | Payer: BLUE CROSS/BLUE SHIELD | Attending: Emergency Medicine | Admitting: Emergency Medicine

## 2016-08-01 DIAGNOSIS — E119 Type 2 diabetes mellitus without complications: Secondary | ICD-10-CM | POA: Insufficient documentation

## 2016-08-01 DIAGNOSIS — Z791 Long term (current) use of non-steroidal anti-inflammatories (NSAID): Secondary | ICD-10-CM | POA: Diagnosis not present

## 2016-08-01 DIAGNOSIS — I10 Essential (primary) hypertension: Secondary | ICD-10-CM | POA: Insufficient documentation

## 2016-08-01 DIAGNOSIS — F17210 Nicotine dependence, cigarettes, uncomplicated: Secondary | ICD-10-CM | POA: Diagnosis not present

## 2016-08-01 DIAGNOSIS — M5136 Other intervertebral disc degeneration, lumbar region: Secondary | ICD-10-CM | POA: Insufficient documentation

## 2016-08-01 DIAGNOSIS — R1030 Lower abdominal pain, unspecified: Secondary | ICD-10-CM | POA: Diagnosis present

## 2016-08-01 DIAGNOSIS — R109 Unspecified abdominal pain: Secondary | ICD-10-CM | POA: Diagnosis not present

## 2016-08-01 DIAGNOSIS — R05 Cough: Secondary | ICD-10-CM | POA: Diagnosis not present

## 2016-08-01 HISTORY — DX: Unspecified rotator cuff tear or rupture of unspecified shoulder, not specified as traumatic: M75.100

## 2016-08-01 LAB — CBC
HCT: 36.8 % (ref 36.0–46.0)
Hemoglobin: 11.4 g/dL — ABNORMAL LOW (ref 12.0–15.0)
MCH: 24.3 pg — ABNORMAL LOW (ref 26.0–34.0)
MCHC: 31 g/dL (ref 30.0–36.0)
MCV: 78.5 fL (ref 78.0–100.0)
Platelets: 416 10*3/uL — ABNORMAL HIGH (ref 150–400)
RBC: 4.69 MIL/uL (ref 3.87–5.11)
RDW: 16 % — ABNORMAL HIGH (ref 11.5–15.5)
WBC: 10.8 10*3/uL — ABNORMAL HIGH (ref 4.0–10.5)

## 2016-08-01 LAB — URINALYSIS, ROUTINE W REFLEX MICROSCOPIC
Bilirubin Urine: NEGATIVE
Glucose, UA: NEGATIVE mg/dL
Hgb urine dipstick: NEGATIVE
Ketones, ur: NEGATIVE mg/dL
Leukocytes, UA: NEGATIVE
Nitrite: NEGATIVE
Protein, ur: NEGATIVE mg/dL
Specific Gravity, Urine: 1.02 (ref 1.005–1.030)
pH: 6 (ref 5.0–8.0)

## 2016-08-01 LAB — BASIC METABOLIC PANEL
Anion gap: 5 (ref 5–15)
BUN: 10 mg/dL (ref 6–20)
CO2: 25 mmol/L (ref 22–32)
Calcium: 9 mg/dL (ref 8.9–10.3)
Chloride: 104 mmol/L (ref 101–111)
Creatinine, Ser: 0.56 mg/dL (ref 0.44–1.00)
GFR calc Af Amer: >60 mL/min (ref >60)
GFR calc non Af Amer: >60 mL/min (ref >60)
Glucose, Bld: 184 mg/dL — ABNORMAL HIGH (ref 65–99)
Potassium: 3.7 mmol/L (ref 3.5–5.1)
Sodium: 134 mmol/L — ABNORMAL LOW (ref 135–145)

## 2016-08-01 LAB — PREGNANCY, URINE: Preg Test, Ur: NEGATIVE

## 2016-08-01 MED ORDER — IOPAMIDOL (ISOVUE-300) INJECTION 61%
100.0000 mL | Freq: Once | INTRAVENOUS | Status: AC | PRN
  Administered 2016-08-01: 100 mL via INTRAVENOUS

## 2016-08-01 MED ORDER — HYDROCODONE-ACETAMINOPHEN 5-325 MG PO TABS: 1.0000 | ORAL_TABLET | ORAL | 0 refills | Status: DC | PRN

## 2016-08-01 MED ORDER — HYDROMORPHONE HCL 1 MG/ML IJ SOLN
1.0000 mg | Freq: Once | INTRAMUSCULAR | Status: AC
  Administered 2016-08-01: 1 mg via INTRAVENOUS
  Filled 2016-08-01: qty 1

## 2016-08-01 MED ORDER — CYCLOBENZAPRINE HCL 10 MG PO TABS: 10.0000 mg | ORAL_TABLET | Freq: Three times a day (TID) | ORAL | 0 refills | Status: DC

## 2016-08-01 MED ORDER — IOPAMIDOL (ISOVUE-300) INJECTION 61%
INTRAVENOUS | Status: AC
  Administered 2016-08-01: 30 mL
  Filled 2016-08-01: qty 30

## 2016-08-01 MED ORDER — DICLOFENAC SODIUM 75 MG PO TBEC: 75.0000 mg | DELAYED_RELEASE_TABLET | Freq: Two times a day (BID) | ORAL | 0 refills | Status: DC

## 2016-08-01 MED ORDER — HYDROCODONE-ACETAMINOPHEN 5-325 MG PO TABS
2.0000 | ORAL_TABLET | Freq: Once | ORAL | Status: AC
  Administered 2016-08-01: 2 via ORAL
  Filled 2016-08-01: qty 2

## 2016-08-01 MED ORDER — ONDANSETRON HCL 4 MG PO TABS
4.0000 mg | ORAL_TABLET | Freq: Once | ORAL | Status: AC
  Administered 2016-08-01: 4 mg via ORAL
  Filled 2016-08-01: qty 1

## 2016-08-01 MED ORDER — ONDANSETRON HCL 4 MG/2ML IJ SOLN
4.0000 mg | Freq: Once | INTRAMUSCULAR | Status: AC
  Administered 2016-08-01: 4 mg via INTRAVENOUS
  Filled 2016-08-01: qty 2

## 2016-08-01 NOTE — Discharge Instructions (Signed)
Your vital signs within normal limits. Your urine pregnancy test is negative, and your urine test is negative for acute problem. The CT scan of your abdomen and pelvis suggests some degenerative disc disease problems in your lower back. There is also noted kidney stones in the kidney, but on the left side. There is also noted a very small cyst on your ovary on the left side, but none on the right were your symptoms are. Please see your primary physician to finish the workup concerning your right flank and back area pain. Please use Flexeril, and diclofenac daily for possible pain related to your degenerative disc disease and spasm in this area. May use Norco for more severe pain. Flexeril and Norco may cause drowsiness, and/or lightheadedness. Please use caution when taking these medications. Please do not drive a vehicle, operating machinery, drink alcohol, or participate in activities requiring concentration when taking these medications.

## 2016-08-01 NOTE — ED Notes (Signed)
Pt returned from ct

## 2016-08-01 NOTE — ED Triage Notes (Signed)
Patient complaining of right flank pain x 4 days. Denies urinary symptoms.

## 2016-08-01 NOTE — ED Provider Notes (Signed)
[facility] DEPT Provider Note   CSN: [id number] Arrival date & time: 08/01/16  0902     History   Chief Complaint Chief Complaint  Patient presents with  . Flank Pain    HPI Tina Pham is a 41 y.o. female.  The history is provided by the patient.  Flank Pain  This is a new problem. The current episode started more than 2 days ago. The problem occurs hourly. The problem has been gradually worsening. Pertinent negatives include no chest pain, no abdominal pain and no shortness of breath. Associated symptoms comments: Lower abd pain. Exacerbated by: movement. The symptoms are relieved by lying down. She has tried nothing for the symptoms.    Past Medical History:  Diagnosis Date  . Anxiety   . Chronic back pain   . Depression   . Diabetes mellitus   . High cholesterol   . Hypertension   . Migraine   . Neuropathic pain of foot   . Plantar fasciitis   . Sciatica   . Torn rotator cuff     Patient Active Problem List   Diagnosis Date Noted  . Left shoulder pain 09/21/2015  . Disorders of bursae and tendons in shoulder region, unspecified 09/25/2013    Past Surgical History:  Procedure Laterality Date  . BACK SURGERY    . EYE SURGERY    . TUBAL LIGATION      OB History    Gravida Para Term Preterm AB Living   3 3 3     3    SAB TAB Ectopic Multiple Live Births                   Home Medications    Prior to Admission medications   Medication Sig Start Date End Date Taking? Authorizing Provider  HYDROcodone-acetaminophen (NORCO/VICODIN) 5-325 MG tablet Take 1 tablet by mouth every 6 (six) hours as needed for moderate pain (Must last 30 days.Do not take and drive a car or use machinery.). 07/06/16  Yes Darreld Mclean, MD  ibuprofen (ADVIL,MOTRIN) 200 MG tablet Take 200-800 mg by mouth every 6 (six) hours as needed for moderate pain.   Yes Historical Provider, MD  albuterol (PROVENTIL HFA;VENTOLIN HFA) 108 (90 Base) MCG/ACT inhaler Inhale 1-2 puffs  into the lungs every 6 (six) hours as needed for wheezing or shortness of breath. Patient not taking: Reported on 08/01/2016 06/12/16   Burgess Amor, PA-C  azithromycin (ZITHROMAX Z-PAK) 250 MG tablet Take 2 tablets by mouth on day one followed by one tablet daily for 4 days. Patient not taking: Reported on 08/01/2016 06/12/16   Burgess Amor, PA-C  benzonatate (TESSALON) 100 MG capsule Take 2 capsules (200 mg total) by mouth 3 (three) times daily as needed. Patient not taking: Reported on 08/01/2016 06/12/16   Burgess Amor, PA-C  predniSONE (DELTASONE) 50 MG tablet Take 1 tablet (50 mg total) by mouth daily with breakfast. Patient not taking: Reported on 08/01/2016 06/12/16   Burgess Amor, PA-C    Family History Family History  Problem Relation Age of Onset  . Heart failure Mother   . COPD Mother   . Diabetes Mother   . Heart failure Father   . Diabetes Other     Social History Social History  Substance Use Topics  . Smoking status: Current Every Day Smoker    Packs/day: 0.50    Years: 20.00    Types: Cigarettes  . Smokeless tobacco: Never Used  . Alcohol use No  Comment: occ     Allergies   Codeine; Benadryl [diphenhydramine hcl]; and Compazine   Review of Systems Review of Systems  Constitutional: Negative for activity change.       All ROS Neg except as noted in HPI  HENT: Negative for nosebleeds.   Eyes: Negative for photophobia and discharge.  Respiratory: Positive for cough. Negative for shortness of breath and wheezing.   Cardiovascular: Negative for chest pain and palpitations.  Gastrointestinal: Negative for abdominal pain, blood in stool, constipation, diarrhea and vomiting.  Genitourinary: Positive for flank pain and frequency. Negative for dysuria, hematuria and vaginal discharge.  Musculoskeletal: Negative for arthralgias, back pain and neck pain.  Skin: Negative.   Neurological: Negative for dizziness, seizures and speech difficulty.  Psychiatric/Behavioral:  Negative for confusion and hallucinations.     Physical Exam Updated Vital Signs BP 107/90 (BP Location: Right Arm)   Pulse 86   Temp 98.1 F (36.7 C) (Oral)   Resp 20   Ht 5\' 8"  (1.727 m)   Wt 108.9 kg   LMP 07/12/2016   SpO2 100%   BMI 36.49 kg/m   Physical Exam  Constitutional: She is oriented to person, place, and time. She appears well-developed and well-nourished.  Non-toxic appearance.  HENT:  Head: Normocephalic.  Right Ear: Tympanic membrane and external ear normal.  Left Ear: Tympanic membrane and external ear normal.  Eyes: EOM and lids are normal. Pupils are equal, round, and reactive to light.  Neck: Normal range of motion. Neck supple. Carotid bruit is not present.  Cardiovascular: Normal rate, regular rhythm, normal heart sounds, intact distal pulses and normal pulses.   Pulmonary/Chest: Breath sounds normal. No respiratory distress.  Abdominal: Soft. Bowel sounds are normal. There is no tenderness. There is no guarding.  Right flank area pain with palpation and with attempted range of motion. No crepitus appreciated. No palpable deformity appreciated.  Musculoskeletal: Normal range of motion.       Lumbar back: She exhibits pain and spasm.  Lymphadenopathy:       Head (right side): No submandibular adenopathy present.       Head (left side): No submandibular adenopathy present.    She has no cervical adenopathy.  Neurological: She is alert and oriented to person, place, and time. She has normal strength. No cranial nerve deficit or sensory deficit.  Skin: Skin is warm and dry.  Psychiatric: She has a normal mood and affect. Her speech is normal.  Nursing note and vitals reviewed.    ED Treatments / Results  Labs (all labs ordered are listed, but only abnormal results are displayed) Labs Reviewed  BASIC METABOLIC PANEL - Abnormal; Notable for the following:       Result Value   Sodium 134 (*)    Glucose, Bld 184 (*)    All other components within  normal limits  CBC - Abnormal; Notable for the following:    WBC 10.8 (*)    Hemoglobin 11.4 (*)    MCH 24.3 (*)    RDW 16.0 (*)    Platelets 416 (*)    All other components within normal limits  URINALYSIS, ROUTINE W REFLEX MICROSCOPIC  PREGNANCY, URINE    EKG  EKG Interpretation None       Radiology No results found.  Procedures Procedures (including critical care time)  Medications Ordered in ED Medications - No data to display   Initial Impression / Assessment and Plan / ED Course  I have reviewed the triage vital  signs and the nursing notes.  Pertinent labs & imaging results that were available during my care of the patient were reviewed by me and considered in my medical decision making (see chart for details).  Clinical Course     **I have reviewed nursing notes, vital signs, and all appropriate lab and imaging results for this patient.*  Final Clinical Impressions(s) / ED Diagnoses  MDM Vital signs reviewed. Basic metabolic panel shows the glucose to be slightly elevated at 184, otherwise within normal limits. Anion gap is 5, within normal limits by my interpretation. Complete blood count shows mild increase in white blood cell count of 10,800, there is no shift to the left. Urinalysis is negative for infection or evidence of kidney stone. Urine pregnancy test is negative, doubt ectopic pregnancy issue. CT scan of the abdomen and pelvis reveals no acute process within the abdomen or pelvis. There is a 2 mm nonobstructing stone within the left kidney. There is also some aortic atherosclerosis.  I've discussed these findings with the patient. We discussed the possibility of this being a musculoskeletal-related issue. I've asked the patient see her primary physician to complete the workup. Prescription for Flexeril and diclofenac given to the patient 4 to assist with her discomfort. Patient will return to the emergency department sooner if any changes, problems, or  concerns.    Final diagnoses:  None    New Prescriptions Discharge Medication List as of 08/01/2016  1:37 PM    START taking these medications   Details  cyclobenzaprine (FLEXERIL) 10 MG tablet Take 1 tablet (10 mg total) by mouth 3 (three) times daily., Starting Tue 08/01/2016, Print    diclofenac (VOLTAREN) 75 MG EC tablet Take 1 tablet (75 mg total) by mouth 2 (two) times daily., Starting Tue 08/01/2016, Print         [place] Laurie Lovejoy, PA-C 08/04/16 [id number]    Raeford RazorStephen Kohut, MD 08/04/16 [phone]

## 2016-08-01 NOTE — ED Notes (Signed)
Ct aware pt finished contrast 

## 2016-08-01 NOTE — ED Notes (Signed)
Pa in with pt.

## 2016-08-03 ENCOUNTER — Encounter: Payer: Self-pay | Admitting: Orthopaedic Surgery

## 2016-08-08 ENCOUNTER — Telehealth: Payer: Self-pay | Admitting: Orthopaedic Surgery

## 2016-08-08 NOTE — Telephone Encounter (Signed)
Hydrocodone-Acetaminophen 5/325mg   Qty 100 Tablets  Pt had requested through her my chart, but wasn't sure you got it.

## 2016-08-10 MED ORDER — HYDROCODONE-ACETAMINOPHEN 5-325 MG PO TABS: 1.0000 | ORAL_TABLET | ORAL | 0 refills | Status: DC | PRN

## 2016-09-11 ENCOUNTER — Telehealth: Payer: Self-pay | Admitting: Orthopaedic Surgery

## 2016-09-12 ENCOUNTER — Emergency Department (HOSPITAL_COMMUNITY)
Admission: EM | Admit: 2016-09-12 | Discharge: 2016-09-12 | Disposition: A | Discharge status: Home / Self Care | Payer: BLUE CROSS/BLUE SHIELD | Attending: Dermatology | Admitting: Dermatology

## 2016-09-12 ENCOUNTER — Encounter (HOSPITAL_COMMUNITY): Payer: Self-pay | Admitting: Emergency Medicine

## 2016-09-12 DIAGNOSIS — E119 Type 2 diabetes mellitus without complications: Secondary | ICD-10-CM | POA: Insufficient documentation

## 2016-09-12 DIAGNOSIS — Z791 Long term (current) use of non-steroidal anti-inflammatories (NSAID): Secondary | ICD-10-CM | POA: Diagnosis not present

## 2016-09-12 DIAGNOSIS — Z5321 Procedure and treatment not carried out due to patient leaving prior to being seen by health care provider: Secondary | ICD-10-CM | POA: Diagnosis not present

## 2016-09-12 DIAGNOSIS — Z79899 Other long term (current) drug therapy: Secondary | ICD-10-CM | POA: Diagnosis not present

## 2016-09-12 DIAGNOSIS — I10 Essential (primary) hypertension: Secondary | ICD-10-CM | POA: Insufficient documentation

## 2016-09-12 DIAGNOSIS — M542 Cervicalgia: Secondary | ICD-10-CM | POA: Insufficient documentation

## 2016-09-12 DIAGNOSIS — F17210 Nicotine dependence, cigarettes, uncomplicated: Secondary | ICD-10-CM | POA: Diagnosis not present

## 2016-09-12 MED ORDER — HYDROCODONE-ACETAMINOPHEN 5-325 MG PO TABS: 1.0000 | ORAL_TABLET | ORAL | 0 refills | Status: DC | PRN

## 2016-09-12 NOTE — ED Notes (Signed)
Pt called to recheck VS and update pt on wait time, no answer in waiting room.

## 2016-09-12 NOTE — ED Notes (Signed)
Pt called, no answer in waiting room

## 2016-09-12 NOTE — ED Notes (Signed)
Called pt to recheck VS and update pt on wait, no answer from waiting room.
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
## 2016-10-03 ENCOUNTER — Ambulatory Visit: Payer: BLUE CROSS/BLUE SHIELD | Admitting: Orthopaedic Surgery

## 2016-10-05 ENCOUNTER — Ambulatory Visit (INDEPENDENT_AMBULATORY_CARE_PROVIDER_SITE_OTHER): Payer: BLUE CROSS/BLUE SHIELD | Admitting: Orthopaedic Surgery

## 2016-10-05 ENCOUNTER — Encounter: Payer: Self-pay | Admitting: Orthopaedic Surgery

## 2016-10-05 VITALS — BP 159/88 | HR 77 | Ht 68.0 in | Wt 235.0 lb

## 2016-10-05 DIAGNOSIS — F17210 Nicotine dependence, cigarettes, uncomplicated: Secondary | ICD-10-CM | POA: Diagnosis not present

## 2016-10-05 DIAGNOSIS — M25511 Pain in right shoulder: Secondary | ICD-10-CM

## 2016-10-05 DIAGNOSIS — G8929 Other chronic pain: Secondary | ICD-10-CM

## 2016-10-05 MED ORDER — HYDROCODONE-ACETAMINOPHEN 5-325 MG PO TABS: 1.0000 | ORAL_TABLET | ORAL | 0 refills | Status: DC | PRN

## 2016-10-05 MED ORDER — NAPROXEN 500 MG PO TABS: 500.0000 mg | ORAL_TABLET | Freq: Two times a day (BID) | ORAL | 5 refills | Status: DC

## 2016-10-05 NOTE — Progress Notes (Signed)
PROCEDURE NOTE:  The patient request injection, verbal consent was obtained.  The right shoulder was prepped appropriately after time out was performed.   Sterile technique was observed and injection of 1 cc of Depo-Medrol 40 mg with several cc's of plain xylocaine. Anesthesia was provided by ethyl chloride and a 20-gauge needle was used to inject the shoulder area. A posterior approach was used.  The injection was tolerated well.  A band aid dressing was applied.  The patient was advised to apply ice later today and tomorrow to the injection sight as needed.  Encounter Diagnoses  Name Primary?  . Chronic right shoulder pain Yes  . Cigarette nicotine dependence without complication    She continues to smoke and is not willing to stop.  Return in two months.  I have reviewed the [place] Controlled Substance Reporting System web site prior to prescribing narcotic medicine for this patient.  Electronically Signed Darreld McleanWayne Kevonna Nolte, MD 3/15/201810:30 AM

## 2016-10-05 NOTE — Patient Instructions (Signed)
Steps to Quit Smoking Smoking tobacco can be bad for your health. It can also affect almost every organ in your body. Smoking puts you and people around you at risk for many serious long-lasting (chronic) diseases. Quitting smoking is hard, but it is one of the best things that you can do for your health. It is never too late to quit. What are the benefits of quitting smoking? When you quit smoking, you lower your risk for getting serious diseases and conditions. They can include:  Lung cancer or lung disease.  Heart disease.  Stroke.  Heart attack.  Not being able to have children (infertility).  Weak bones (osteoporosis) and broken bones (fractures). If you have coughing, wheezing, and shortness of breath, those symptoms may get better when you quit. You may also get sick less often. If you are pregnant, quitting smoking can help to lower your chances of having a baby of low birth weight. What can I do to help me quit smoking? Talk with your doctor about what can help you quit smoking. Some things you can do (strategies) include:  Quitting smoking totally, instead of slowly cutting back how much you smoke over a period of time.  Going to in-person counseling. You are more likely to quit if you go to many counseling sessions.  Using resources and support systems, such as:  Online chats with a counselor.  Phone quitlines.  Printed self-help materials.  Support groups or group counseling.  Text messaging programs.  Mobile phone apps or applications.  Taking medicines. Some of these medicines may have nicotine in them. If you are pregnant or breastfeeding, do not take any medicines to quit smoking unless your doctor says it is okay. Talk with your doctor about counseling or other things that can help you. Talk with your doctor about using more than one strategy at the same time, such as taking medicines while you are also going to in-person counseling. This can help make quitting  easier. What things can I do to make it easier to quit? Quitting smoking might feel very hard at first, but there is a lot that you can do to make it easier. Take these steps:  Talk to your family and friends. Ask them to support and encourage you.  Call phone quitlines, reach out to support groups, or work with a counselor.  Ask people who smoke to not smoke around you.  Avoid places that make you want (trigger) to smoke, such as:  Bars.  Parties.  Smoke-break areas at work.  Spend time with people who do not smoke.  Lower the stress in your life. Stress can make you want to smoke. Try these things to help your stress:  Getting regular exercise.  Deep-breathing exercises.  Yoga.  Meditating.  Doing a body scan. To do this, close your eyes, focus on one area of your body at a time from head to toe, and notice which parts of your body are tense. Try to relax the muscles in those areas.  Download or buy apps on your mobile phone or tablet that can help you stick to your quit plan. There are many free apps, such as QuitGuide from the CDC (Centers for Disease Control and Prevention). You can find more support from smokefree.gov and other websites. This information is not intended to replace advice given to you by your health care provider. Make sure you discuss any questions you have with your health care provider. Document Released: 05/06/2009 Document Revised: 03/07/2016 Document   Reviewed: 11/24/2014 Elsevier Interactive Patient Education  2017 [employer]  

## 2016-11-06 ENCOUNTER — Telehealth: Payer: Self-pay | Admitting: Orthopaedic Surgery

## 2016-11-06 MED ORDER — HYDROCODONE-ACETAMINOPHEN 5-325 MG PO TABS: 1.0000 | ORAL_TABLET | ORAL | 0 refills | Status: DC | PRN

## 2016-11-16 ENCOUNTER — Encounter: Payer: Self-pay | Admitting: Orthopaedic Surgery

## 2016-11-16 ENCOUNTER — Ambulatory Visit (INDEPENDENT_AMBULATORY_CARE_PROVIDER_SITE_OTHER): Payer: BLUE CROSS/BLUE SHIELD | Admitting: Orthopaedic Surgery

## 2016-11-16 ENCOUNTER — Ambulatory Visit (INDEPENDENT_AMBULATORY_CARE_PROVIDER_SITE_OTHER): Payer: BLUE CROSS/BLUE SHIELD

## 2016-11-16 VITALS — BP 145/77 | HR 83 | Temp 97.9°F | Ht 68.0 in | Wt 238.0 lb

## 2016-11-16 DIAGNOSIS — F17210 Nicotine dependence, cigarettes, uncomplicated: Secondary | ICD-10-CM

## 2016-11-16 DIAGNOSIS — M25562 Pain in left knee: Secondary | ICD-10-CM

## 2016-11-16 DIAGNOSIS — G8929 Other chronic pain: Secondary | ICD-10-CM | POA: Diagnosis not present

## 2016-11-16 NOTE — Progress Notes (Signed)
Patient ZO:XWRUE Tina Pham, female DOB:June 21, 1976, 41 y.o. AVW:[id number]  Chief Complaint  Patient presents with  . Knee Pain    left knee pain    HPI  Tina Pham is a 41 y.o. female who has had increasing pain of the left knee.  She has giving way now and increased pain with no locking or redness.  The giving way is a problem.  She has no new trauma.  She has not been improved with rest, medicine or rubs.  I would like to get a MRI of her left knee as I am concerned she may have a meniscus tear.  She has giving way, positive medial McMurray and no good response to conservative treatment to date. HPI  Body mass index is 36.19 kg/m.  ROS  Review of Systems  Constitutional:       Patient has Diabetes Mellitus. Patient does not have hypertension. Patient does not have COPD or shortness of breath. Patient has BMI > 35. Patient has current smoking history.  HENT: Negative for congestion.   Respiratory: Negative for cough and shortness of breath.   Cardiovascular: Negative for chest pain.  Endocrine: Negative for cold intolerance.  Musculoskeletal: Positive for arthralgias, back pain and myalgias.  Allergic/Immunologic: Negative for environmental allergies.  Psychiatric/Behavioral: The patient is nervous/anxious.     Past Medical History:  Diagnosis Date  . Anxiety   . Chronic back pain   . Depression   . Diabetes mellitus   . High cholesterol   . Hypertension   . Migraine   . Neuropathic pain of foot   . Plantar fasciitis   . Sciatica   . Torn rotator cuff     Past Surgical History:  Procedure Laterality Date  . BACK SURGERY    . EYE SURGERY    . TUBAL LIGATION      Family History  Problem Relation Age of Onset  . Heart failure Mother   . COPD Mother   . Diabetes Mother   . Heart failure Father   . Diabetes Other     Social History Social History  Substance Use Topics  . Smoking status: Current Every Day Smoker    Packs/day: 0.50   Years: 20.00    Types: Cigarettes  . Smokeless tobacco: Never Used  . Alcohol use No     Comment: occ    Allergies  Allergen Reactions  . Codeine Other (See Comments)    Upset Stomach  . Benadryl [Diphenhydramine Hcl] Palpitations  . Compazine Palpitations    Current Outpatient Prescriptions  Medication Sig Dispense Refill  . cyclobenzaprine (FLEXERIL) 10 MG tablet Take 1 tablet (10 mg total) by mouth 3 (three) times daily. 20 tablet 0  . HYDROcodone-acetaminophen (NORCO/VICODIN) 5-325 MG tablet Take 1 tablet by mouth every 4 (four) hours as needed for moderate pain (Must last 30 days.  Do not take and drive a car or use machinery.). 20 tablet 0  . naproxen (NAPROSYN) 500 MG tablet Take 1 tablet (500 mg total) by mouth 2 (two) times daily with a meal. 60 tablet 5   No current facility-administered medications for this visit.      Physical Exam  Blood pressure (!) 145/77, pulse 83, temperature 97.9 F (36.6 C), height  (1.727 m), weight 238 lb (108 kg), last menstrual period 11/14/2016.  Constitutional: overall normal hygiene, normal nutrition, well developed, normal grooming, normal body habitus. Assistive device:none  Musculoskeletal: gait and station Limp left, muscle tone and strength are normal,  no tremors or atrophy is present.  .  Neurological: coordination overall normal.  Deep tendon reflex/nerve stretch intact.  Sensation normal.  Cranial nerves II-XII intact.   Skin:   Normal overall no scars, lesions, ulcers or rashes. No psoriasis.  Psychiatric: Alert and oriented x 3.  Recent memory intact, remote memory unclear.  Normal mood and affect. Well groomed.  Good eye contact.  Cardiovascular: overall no swelling, no varicosities, no edema bilaterally, normal temperatures of the legs and arms, no clubbing, cyanosis and good capillary refill.  Lymphatic: palpation is normal.  The left lower extremity is examined:  Inspection:  Thigh:  Non-tender and no  defects  Knee has swelling 1+ effusion.                        Joint tenderness is present                        Patient is tender over the medial joint line  Lower Leg:  Has normal appearance and no tenderness or defects  Ankle:  Non-tender and no defects  Foot:  Non-tender and no defects Range of Motion:  Knee:  Range of motion is: 0-110                        Crepitus is  present  Ankle:  Range of motion is normal. Strength and Tone:  The left lower extremity has normal strength and tone. Stability:  Knee:  The knee has positive medial McMurray.  Ankle:  The ankle is stable.    The patient has been educated about the nature of the problem(s) and counseled on treatment options.  The patient appeared to understand what I have discussed and is in agreement with it.  Encounter Diagnoses  Name Primary?  . Chronic pain of left knee Yes  . Cigarette nicotine dependence without complication    PROCEDURE NOTE:  The patient requests injections of the left knee , verbal consent was obtained.  The left knee was prepped appropriately after time out was performed.   Sterile technique was observed and injection of 1 cc of Depo-Medrol 40 mg with several cc's of plain xylocaine. Anesthesia was provided by ethyl chloride and a 20-gauge needle was used to inject the knee area. The injection was tolerated well.  A band aid dressing was applied.  The patient was advised to apply ice later today and tomorrow to the injection sight as needed.   PLAN Call if any problems.  Precautions discussed.  Continue current medications.   She smokes and is not willing to stop at this time.  Return to clinic after MRI of the left knee   Electronically Signed Darreld Mclean, MD 4/26/201810:03 AM

## 2016-11-22 ENCOUNTER — Ambulatory Visit (HOSPITAL_COMMUNITY)
Admission: RE | Admit: 2016-11-22 | Discharge: 2016-11-22 | Disposition: A | Discharge status: Home / Self Care | Payer: BLUE CROSS/BLUE SHIELD | Source: Ambulatory Visit | Attending: Orthopaedic Surgery | Admitting: Orthopaedic Surgery

## 2016-11-22 DIAGNOSIS — M949 Disorder of cartilage, unspecified: Secondary | ICD-10-CM | POA: Diagnosis not present

## 2016-11-22 DIAGNOSIS — G8929 Other chronic pain: Secondary | ICD-10-CM | POA: Diagnosis present

## 2016-11-22 DIAGNOSIS — S83222A Peripheral tear of medial meniscus, current injury, left knee, initial encounter: Secondary | ICD-10-CM | POA: Diagnosis not present

## 2016-11-22 DIAGNOSIS — M25562 Pain in left knee: Secondary | ICD-10-CM | POA: Diagnosis not present

## 2016-11-30 ENCOUNTER — Ambulatory Visit: Payer: BLUE CROSS/BLUE SHIELD | Admitting: Orthopaedic Surgery

## 2016-12-01 ENCOUNTER — Encounter: Payer: Self-pay | Admitting: Orthopaedic Surgery

## 2016-12-01 ENCOUNTER — Ambulatory Visit (INDEPENDENT_AMBULATORY_CARE_PROVIDER_SITE_OTHER): Payer: BLUE CROSS/BLUE SHIELD | Admitting: Orthopaedic Surgery

## 2016-12-01 VITALS — BP 164/91 | HR 87 | Ht 68.0 in | Wt 236.0 lb

## 2016-12-01 DIAGNOSIS — M25562 Pain in left knee: Secondary | ICD-10-CM | POA: Diagnosis not present

## 2016-12-01 DIAGNOSIS — S83242A Other tear of medial meniscus, current injury, left knee, initial encounter: Secondary | ICD-10-CM

## 2016-12-01 DIAGNOSIS — G8929 Other chronic pain: Secondary | ICD-10-CM

## 2016-12-01 DIAGNOSIS — F17210 Nicotine dependence, cigarettes, uncomplicated: Secondary | ICD-10-CM

## 2016-12-01 NOTE — Progress Notes (Signed)
Patient ZO:XWRUE Tina Pham, female DOB:1976/05/15, 41 y.o. AVW:[id number]  Chief Complaint  Patient presents with  . Knee Pain    f/u left knee pain-getting worse    HPI  Tina Pham is a 41 y.o. female who has pain of the left knee with giving way and popping.  She had MRI which showed: IMPRESSION: 1. There is an inferior articular surfacing tear involving the posterior horn of the medial meniscus. 2. Mild partial thickness cartilage loss within the femorotibial compartment without focal chondral defects. 3. No marrow signal abnormalities to suggest fracture or edema. 4. Intact cruciate and collateral ligaments.  I have explained the findings to her and used a model.  I have recommended arthroscopy of the left knee.  I will have Dr. Romeo Apple see her. She agrees.  HPI  Body mass index is 35.88 kg/m.  ROS  Review of Systems  Constitutional:       Patient has Diabetes Mellitus. Patient does not have hypertension. Patient does not have COPD or shortness of breath. Patient has BMI > 35. Patient has current smoking history.  HENT: Negative for congestion.   Respiratory: Negative for cough and shortness of breath.   Cardiovascular: Negative for chest pain.  Endocrine: Negative for cold intolerance.  Musculoskeletal: Positive for arthralgias, back pain and myalgias.  Allergic/Immunologic: Negative for environmental allergies.  Psychiatric/Behavioral: The patient is nervous/anxious.     Past Medical History:  Diagnosis Date  . Anxiety   . Chronic back pain   . Depression   . Diabetes mellitus   . High cholesterol   . Hypertension   . Migraine   . Neuropathic pain of foot   . Plantar fasciitis   . Sciatica   . Torn rotator cuff     Past Surgical History:  Procedure Laterality Date  . BACK SURGERY    . EYE SURGERY    . TUBAL LIGATION      Family History  Problem Relation Age of Onset  . Heart failure Mother   . COPD Mother   . Diabetes Mother    . Heart failure Father   . Diabetes Other     Social History Social History  Substance Use Topics  . Smoking status: Current Every Day Smoker    Packs/day: 0.50    Years: 20.00    Types: Cigarettes  . Smokeless tobacco: Never Used  . Alcohol use No     Comment: occ    Allergies  Allergen Reactions  . Codeine Other (See Comments)    Upset Stomach  . Benadryl [Diphenhydramine Hcl] Palpitations  . Compazine Palpitations    Current Outpatient Prescriptions  Medication Sig Dispense Refill  . HYDROcodone-acetaminophen (NORCO/VICODIN) 5-325 MG tablet Take 1 tablet by mouth every 4 (four) hours as needed for moderate pain (Must last 30 days.  Do not take and drive a car or use machinery.). 20 tablet 0  . naproxen (NAPROSYN) 500 MG tablet Take 1 tablet (500 mg total) by mouth 2 (two) times daily with a meal. 60 tablet 5   No current facility-administered medications for this visit.      Physical Exam  Blood pressure (!) 164/91, pulse 87, height 5\' 8"  (1.727 m), weight 236 lb (107 kg), last menstrual period 11/14/2016.  Constitutional: overall normal hygiene, normal nutrition, well developed, normal grooming, normal body habitus. Assistive device:none  Musculoskeletal: gait and station Limp left, muscle tone and strength are normal, no tremors or atrophy is present.  .  Neurological: coordination overall  normal.  Deep tendon reflex/nerve stretch intact.  Sensation normal.  Cranial nerves II-XII intact.   Skin:   Normal overall no scars, lesions, ulcers or rashes. No psoriasis.  Psychiatric: Alert and oriented x 3.  Recent memory intact, remote memory unclear.  Normal mood and affect. Well groomed.  Good eye contact.  Cardiovascular: overall no swelling, no varicosities, no edema bilaterally, normal temperatures of the legs and arms, no clubbing, cyanosis and good capillary refill.  Lymphatic: palpation is normal.  Her left knee has 1+ effusion, medial joint line pain, ROM  0 to 110, positive medial McMurray and tenderness.  NV intact.  The patient has been educated about the nature of the problem(s) and counseled on treatment options.  The patient appeared to understand what I have discussed and is in agreement with it.  Encounter Diagnoses  Name Primary?  . Chronic pain of left knee Yes  . Cigarette nicotine dependence without complication   . Acute medial meniscus tear of left knee, initial encounter     PLAN Call if any problems.  Precautions discussed.  Continue current medications.   Return to clinic To see Dr. Romeo AppleHarrison for surgical evaluation.   Electronically Signed Darreld McleanWayne Cairo Agostinelli, MD 5/11/201810:04 AM

## 2016-12-03 ENCOUNTER — Other Ambulatory Visit: Payer: Self-pay | Admitting: Orthopaedic Surgery

## 2016-12-20 ENCOUNTER — Encounter: Payer: Self-pay | Admitting: Orthopedic Surgery

## 2016-12-20 ENCOUNTER — Encounter: Payer: Self-pay | Admitting: *Deleted

## 2016-12-20 ENCOUNTER — Ambulatory Visit (INDEPENDENT_AMBULATORY_CARE_PROVIDER_SITE_OTHER): Payer: BLUE CROSS/BLUE SHIELD | Admitting: Orthopedic Surgery

## 2016-12-20 VITALS — BP 132/91 | HR 110 | Wt 238.0 lb

## 2016-12-20 DIAGNOSIS — M25562 Pain in left knee: Secondary | ICD-10-CM

## 2016-12-20 DIAGNOSIS — G8929 Other chronic pain: Secondary | ICD-10-CM | POA: Diagnosis not present

## 2016-12-20 DIAGNOSIS — S83242D Other tear of medial meniscus, current injury, left knee, subsequent encounter: Secondary | ICD-10-CM

## 2016-12-20 DIAGNOSIS — F17210 Nicotine dependence, cigarettes, uncomplicated: Secondary | ICD-10-CM

## 2016-12-20 MED ORDER — HYDROCODONE-ACETAMINOPHEN 5-325 MG PO TABS: 1.0000 | ORAL_TABLET | Freq: Three times a day (TID) | ORAL | 0 refills | Status: DC | PRN

## 2016-12-20 NOTE — Patient Instructions (Signed)
Surgery June 6  You have decided to proceed with operative arthroscopy of the knee. You have decided not to continue with nonoperative measures such as but not limited to oral medication, weight loss, activity modification, physical therapy, bracing, or injection.  We will perform operative arthroscopy of the knee. Some of the risks associated with arthroscopic surgery of the knee include but are not limited to Bleeding Infection Swelling Stiffness Blood clot Pain  If you're not comfortable with these risks and would like to continue with nonoperative treatment please let Dr. Romeo AppleHarrison know prior to your surgery.

## 2016-12-20 NOTE — Progress Notes (Addendum)
PREOP  OFFICE VISIT    Chief Complaint  Patient presents with  . Knee Problem    left knee    Tina Pham is a 41 y.o. female who has had increasing pain of the left knee.  She has giving way now and increased pain with no locking or redness.  The giving way is a problem.  She has no new trauma.  She has not been improved with rest, medicine or rubs.    Review of Systems  Constitutional: Negative.   Respiratory: Negative.   Cardiovascular: Negative.   Musculoskeletal: Positive for back pain.  Neurological: Negative for tingling.   Past Medical History:  Diagnosis Date  . Anxiety   . Chronic back pain   . Depression   . Diabetes mellitus   . High cholesterol   . Hypertension   . Migraine   . Neuropathic pain of foot   . Plantar fasciitis   . Sciatica   . Torn rotator cuff     Past Surgical History:  Procedure Laterality Date  . BACK SURGERY    . EYE SURGERY    . TUBAL LIGATION      Family History  Problem Relation Age of Onset  . Heart failure Mother   . COPD Mother   . Diabetes Mother   . Heart failure Father   . Diabetes Other    Social History  Substance Use Topics  . Smoking status: Current Every Day Smoker    Packs/day: 0.50    Years: 20.00    Types: Cigarettes  . Smokeless tobacco: Never Used  . Alcohol use No     Comment: occ    BP (!) 132/91   Pulse (!) 110   Wt 238 lb (108 kg)   BMI 36.19 kg/m   Physical Exam  Constitutional: She is oriented to person, place, and time. She appears well-developed and well-nourished.  Neurological: She is alert and oriented to person, place, and time. Gait abnormal.  Psychiatric: She has a normal mood and affect.  Vitals reviewed.   Right Knee Exam   Tenderness  The patient is experiencing no tenderness.     Range of Motion  Extension: normal  Flexion: normal   Muscle Strength   The patient has normal right knee strength.  Tests  McMurray:  Medial - negative Lateral -  negative Drawer:       Anterior - negative    Posterior - negative Varus: negative Valgus: negative  Other  Erythema: absent Scars: absent Sensation: normal Pulse: present Swelling: none   Left Knee Exam   Tenderness  The patient is experiencing tenderness in the medial joint line.  Range of Motion  Extension:  5 normal  Flexion: normal   Muscle Strength   The patient has normal left knee strength.  Tests  McMurray:  Medial - positive Lateral - negative Drawer:       Anterior - negative     Posterior - negative Varus: negative Valgus: negative  Other  Erythema: absent Scars: absent Sensation: normal Pulse: present Swelling: none      Meds ordered this encounter  Medications  . HYDROcodone-acetaminophen (NORCO) 5-325 MG tablet    Sig: Take 1 tablet by mouth every 8 (eight) hours as needed for moderate pain.    Dispense:  21 tablet    Refill:  0    Encounter Diagnoses  Name Primary?  . Chronic pain of left knee Yes  . Cigarette nicotine dependence without complication   .  Acute medial meniscus tear of left knee, subsequent encounter      PLAN:  SALK MEDIAL MENISECTOMY   We discussed the condition of her knee including the arthritis with chondral changes and tenderness over the medial femoral condyle which she will still have discomfort with after. She also has sciatica which is chronic and will continue to give her some posterior leg and knee pain as well.  This procedure has been fully reviewed with the patient and written informed consent has been obtained.

## 2016-12-22 NOTE — Patient Instructions (Signed)
Tina Pham  12/22/2016     @PREFPERIOPPHARMACY @   Your procedure is scheduled on  12/27/2016   [facility] to [facility] at  1050  A.M.  Call this number if you have problems the morning of surgery:  [phone]   Remember:  Do not eat food or drink liquids after midnight.  Take these medicines the morning of surgery with A SIP OF WATER  Hydrocodone (if needed).   Do not wear jewelry, make-up or nail polish.  Do not wear lotions, powders, or perfumes, or deoderant.  Do not shave 48 hours prior to surgery.  Men may shave face and neck.  Do not bring valuables to the hospital.  [facility] is not responsible for any belongings or valuables.  Contacts, dentures or bridgework may not be worn into surgery.  Leave your suitcase in the car.  After surgery it may be brought to your room.  For patients admitted to the hospital, discharge time will be determined by your treatment team.  Patients discharged the day of surgery will not be allowed to drive home.   Name and phone number of your driver:   family Special instructions:  None  Please read over the following fact sheets that you were given. Anesthesia Post-op Instructions and Care and Recovery After Surgery      Knee Ligament Injury, Arthroscopy Arthroscopy is a surgical technique in which your health care provider examines your knee through a small, pencil-sized telescope (arthroscope). Often, repairs to injured ligaments can be done with instruments in the arthroscope. Arthroscopy is less invasive than open-knee surgery. Tell a health care provider about:  Any allergies you have.  All medicines you are taking, including vitamins, herbs, eye drops, creams, and over-the-counter medicines.  Any problems you or family members have had with anesthetic medicines.  Any blood disorders you have.  Any surgeries you have had.  Any medical conditions you have. What are the risks? Generally, this is a safe  procedure. However, as with any procedure, problems can occur. Possible problems include:  Infection.  Bleeding.  Stiffness.  What happens before the procedure?  Ask your health care provider about changing or stopping any regular medicines. Avoid taking aspirin or blood thinners as directed by your health care provider.  Do not eat or drink anything after midnight the night before surgery.  If you smoke, do not smoke for at least 2 weeks before your surgery.  Do not drink alcohol starting the day before your surgery.  Let your health care provider know if you develop a cold or any infection before your surgery.  Arrange for someone to drive you home after the surgery or after your hospital stay. Also arrange for someone to help you with activities during recovery. What happens during the procedure?  Small monitors will be put on your body. They are used to check your heart, blood pressure, and oxygen levels.  An IV access tube will be put into one of your veins. Medicine will be able to flow directly into your body through this IV tube.  You might be given a medicine to help you relax (sedative).  You will be given a medicine that makes you go to sleep (general anesthetic), and a breathing tube will be placed into your lungs during the procedure.  Several small incisions are made in your knee. Saline fluid is placed into one of the incisions to expand the knee and clear  away any blood in the knee.  Your health care provider will insert the arthroscope to examine the injured knee.  During arthroscopy, your health care provider may find a partial or complete tear in a ligament.  Tools can be inserted through the other incisions to repair the injured ligaments.  The incisions are then closed with absorbable stitches and covered with dressings. What happens after the procedure?  You will be taken to the recovery area where you will be monitored.  When you are awake, stable,  and taking fluids without problems, you will be allowed to go home. This information is not intended to replace advice given to you by your health care provider. Make sure you discuss any questions you have with your health care provider. Document Released: 07/07/2000 Document Revised: 12/16/2015 Document Reviewed: 02/19/2013 Elsevier Interactive Patient Education  2017 [place].  Arthroscopic Knee Ligament Repair, Care After This sheet gives you information about how to care for yourself after your procedure. Your health care provider may also give you more specific instructions. If you have problems or questions, contact your health care provider. What can I expect after the procedure? After the procedure, it is common to have:  Pain in your knee.  Bruising and swelling on your knee, calf, and ankle for 3-4 days.  Fatigue.  Follow these instructions at home: If you have a brace or immobilizer:  Wear the brace or immobilizer as told by your health care provider. Remove it only as told by your health care provider.  Loosen the splint or immobilizer if your toes tingle, become numb, or turn cold and blue.  Keep the brace or immobilizer clean. Bathing  Do not take baths, swim, or use a hot tub until your health care provider approves. Ask your health care provider if you can take showers.  Keep your bandage (dressing) dry until your health care provider says that it can be removed. Cover it and your brace or immobilizer with a watertight covering when you take a shower. Incision care  Follow instructions from your health care provider about how to take care of your incision. Make sure you: ? Wash your hands with soap and water before you change your bandage (dressing). If soap and water are not available, use hand sanitizer. ? Change your dressing as told by your health care provider. ? Leave stitches (sutures), skin glue, or adhesive strips in place. These skin closures may need  to stay in place for 2 weeks or longer. If adhesive strip edges start to loosen and curl up, you may trim the loose edges. Do not remove adhesive strips completely unless your health care provider tells you to do that.  Check your incision area every day for signs of infection. Check for: ? More redness, swelling, or pain. ? More fluid or blood. ? Warmth. ? Pus or a bad smell. Managing pain, stiffness, and swelling  If directed, put ice on the affected area. ? If you have a removable brace or immobilizer, remove it as told by your health care provider. ? Put ice in a plastic bag. ? Place a towel between your skin and the bag or between your brace or immobilizer and the bag. ? Leave the ice on for 20 minutes, 2-3 times a day.  Move your toes often to avoid stiffness and to lessen swelling.  Raise (elevate) the injured area above the level of your heart while you are sitting or lying down. Driving  Do not drive until  your health care provider approves. If you have a brace or immobilizer on your leg, ask your health care provider when it is safe for you to drive.  Do not drive or use heavy machinery while taking prescription pain medicine. Activity  Rest as directed. Ask your health care provider what activities are safe for you.  Do physical therapy exercises as told by your health care provider. Physical therapy will help you regain strength and motion in your knee.  Follow instructions from your health care provider about: ? When you may start motion exercises. ? When you may start riding a stationary bike and doing other low-impact activities. ? When you may start to jog and do other high-impact activities. Safety  Do not use the injured limb to support your body weight until your health care provider says that you can. Use crutches as told by your health care provider. General instructions  Do not use any products that contain nicotine or tobacco, such as cigarettes and  e-cigarettes. These can delay bone healing. If you need help quitting, ask your health care provider.  To prevent or treat constipation while you are taking prescription pain medicine, your health care provider may recommend that you: ? Drink enough fluid to keep your urine clear or pale yellow. ? Take over-the-counter or prescription medicines. ? Eat foods that are high in fiber, such as fresh fruits and vegetables, whole grains, and beans. ? Limit foods that are high in fat and processed sugars, such as fried and sweet foods.  Take over-the-counter and prescription medicines only as told by your health care provider.  Keep all follow-up visits as told by your health care provider. This is important. Contact a health care provider if:  You have more redness, swelling, or pain around an incision.  You have more fluid or blood coming from an incision.  Your incision feels warm to the touch.  You have a fever.  You have pain or swelling in your knee, and it gets worse.  You have pain that does not get better with medicine. Get help right away if:  You have trouble breathing.  You have pus or a bad smell coming from an incision.  You have numbness and tingling near the knee joint. Summary  After the procedure, it is common to have knee pain with bruising and swelling on your knee, calf, and ankle.  Icing your knee and raising your leg above the level of your heart will help control the pain and the swelling.  Do physical therapy exercises as told by your health care provider. Physical therapy will help you regain strength and motion in your knee. This information is not intended to replace advice given to you by your health care provider. Make sure you discuss any questions you have with your health care provider. Document Released: 04/30/2013 Document Revised: 07/04/2016 Document Reviewed: 07/04/2016 Elsevier Interactive Patient Education  2017 [place]  Anesthesia, Adult General anesthesia is the use of medicines to make a person "go to sleep" (be unconscious) for a medical procedure. General anesthesia is often recommended when a procedure:  Is long.  Requires you to be still or in an unusual position.  Is major and can cause you to lose blood.  Is impossible to do without general anesthesia.  The medicines used for general anesthesia are called general anesthetics. In addition to making you sleep, the medicines:  Prevent pain.  Control your blood pressure.  Relax your muscles.  Tell  a health care provider about:  Any allergies you have.  All medicines you are taking, including vitamins, herbs, eye drops, creams, and over-the-counter medicines.  Any problems you or family members have had with anesthetic medicines.  Types of anesthetics you have had in the past.  Any bleeding disorders you have.  Any surgeries you have had.  Any medical conditions you have.  Any history of heart or lung conditions, such as heart failure, sleep apnea, or chronic obstructive pulmonary disease (COPD).  Whether you are pregnant or may be pregnant.  Whether you use tobacco, alcohol, marijuana, or street drugs.  Any history of Armed forces logistics/support/administrative officer.  Any history of depression or anxiety. What are the risks? Generally, this is a safe procedure. However, problems may occur, including:  Allergic reaction to anesthetics.  Lung and heart problems.  Inhaling food or liquids from your stomach into your lungs (aspiration).  Injury to nerves.  Waking up during your procedure and being unable to move (rare).  Extreme agitation or a state of mental confusion (delirium) when you wake up from the anesthetic.  Air in the bloodstream, which can lead to stroke.  These problems are more likely to develop if you are having a major surgery or if you have an advanced medical condition. You can prevent some of these complications by answering all of  your health care provider's questions thoroughly and by following all pre-procedure instructions. General anesthesia can cause side effects, including:  Nausea or vomiting  A sore throat from the breathing tube.  Feeling cold or shivery.  Feeling tired, washed out, or achy.  Sleepiness or drowsiness.  Confusion or agitation.  What happens before the procedure? Staying hydrated Follow instructions from your health care provider about hydration, which may include:  Up to 2 hours before the procedure - you may continue to drink clear liquids, such as water, clear fruit juice, black coffee, and plain tea.  Eating and drinking restrictions Follow instructions from your health care provider about eating and drinking, which may include:  8 hours before the procedure - stop eating heavy meals or foods such as meat, fried foods, or fatty foods.  6 hours before the procedure - stop eating light meals or foods, such as toast or cereal.  6 hours before the procedure - stop drinking milk or drinks that contain milk.  2 hours before the procedure - stop drinking clear liquids.  Medicines  Ask your health care provider about: ? Changing or stopping your regular medicines. This is especially important if you are taking diabetes medicines or blood thinners. ? Taking medicines such as aspirin and ibuprofen. These medicines can thin your blood. Do not take these medicines before your procedure if your health care provider instructs you not to. ? Taking new dietary supplements or medicines. Do not take these during the week before your procedure unless your health care provider approves them.  If you are told to take a medicine or to continue taking a medicine on the day of the procedure, take the medicine with sips of water. General instructions   Ask if you will be going home the same day, the following day, or after a longer hospital stay. ? Plan to have someone take you home. ? Plan to  have someone stay with you for the first 24 hours after you leave the hospital or clinic.  For 3-6 weeks before the procedure, try not to use any tobacco products, such as cigarettes, chewing tobacco, and e-cigarettes.  You may brush your teeth on the morning of the procedure, but make sure to spit out the toothpaste. What happens during the procedure?  You will be given anesthetics through a mask and through an IV tube in one of your veins.  You may receive medicine to help you relax (sedative).  As soon as you are asleep, a breathing tube may be used to help you breathe.  An anesthesia specialist will stay with you throughout the procedure. He or she will help keep you comfortable and safe by continuing to give you medicines and adjusting the amount of medicine that you get. He or she will also watch your blood pressure, pulse, and oxygen levels to make sure that the anesthetics do not cause any problems.  If a breathing tube was used to help you breathe, it will be removed before you wake up. The procedure may vary among health care providers and hospitals. What happens after the procedure?  You will wake up, often slowly, after the procedure is complete, usually in a recovery area.  Your blood pressure, heart rate, breathing rate, and blood oxygen level will be monitored until the medicines you were given have worn off.  You may be given medicine to help you calm down if you feel anxious or agitated.  If you will be going home the same day, your health care provider may check to make sure you can stand, drink, and urinate.  Your health care providers will treat your pain and side effects before you go home.  Do not drive for 24 hours if you received a sedative.  You may: ? Feel nauseous and vomit. ? Have a sore throat. ? Have mental slowness. ? Feel cold or shivery. ? Feel sleepy. ? Feel tired. ? Feel sore or achy, even in parts of your body where you did not have  surgery. This information is not intended to replace advice given to you by your health care provider. Make sure you discuss any questions you have with your health care provider. Document Released: 10/17/2007 Document Revised: 12/21/2015 Document Reviewed: 06/24/2015 Elsevier Interactive Patient Education  2018 [place] Anesthesia, Adult, Care After These instructions provide you with information about caring for yourself after your procedure. Your health care provider may also give you more specific instructions. Your treatment has been planned according to current medical practices, but problems sometimes occur. Call your health care provider if you have any problems or questions after your procedure. What can I expect after the procedure? After the procedure, it is common to have:  Vomiting.  A sore throat.  Mental slowness.  It is common to feel:  Nauseous.  Cold or shivery.  Sleepy.  Tired.  Sore or achy, even in parts of your body where you did not have surgery.  Follow these instructions at home: For at least 24 hours after the procedure:  Do not: ? Participate in activities where you could fall or become injured. ? Drive. ? Use heavy machinery. ? Drink alcohol. ? Take sleeping pills or medicines that cause drowsiness. ? Make important decisions or sign legal documents. ? Take care of children on your own.  Rest. Eating and drinking  If you vomit, drink water, juice, or soup when you can drink without vomiting.  Drink enough fluid to keep your urine clear or pale yellow.  Make sure you have little or no nausea before eating solid foods.  Follow the diet recommended by your health care provider. General instructions  Have a responsible adult stay with you until you are awake and alert.  Return to your normal activities as told by your health care provider. Ask your health care provider what activities are safe for you.  Take over-the-counter  and prescription medicines only as told by your health care provider.  If you smoke, do not smoke without supervision.  Keep all follow-up visits as told by your health care provider. This is important. Contact a health care provider if:  You continue to have nausea or vomiting at home, and medicines are not helpful.  You cannot drink fluids or start eating again.  You cannot urinate after 8-12 hours.  You develop a skin rash.  You have fever.  You have increasing redness at the site of your procedure. Get help right away if:  You have difficulty breathing.  You have chest pain.  You have unexpected bleeding.  You feel that you are having a life-threatening or urgent problem. This information is not intended to replace advice given to you by your health care provider. Make sure you discuss any questions you have with your health care provider. Document Released: 10/16/2000 Document Revised: 12/13/2015 Document Reviewed: 06/24/2015 Elsevier Interactive Patient Education  [employer].

## 2016-12-25 ENCOUNTER — Encounter (HOSPITAL_COMMUNITY): Admission: RE | Admit: 2016-12-25 | Payer: BLUE CROSS/BLUE SHIELD | Source: Ambulatory Visit

## 2016-12-25 ENCOUNTER — Encounter (HOSPITAL_COMMUNITY)
Admission: RE | Admit: 2016-12-25 | Discharge: 2016-12-25 | Disposition: A | Discharge status: Home / Self Care | Payer: BLUE CROSS/BLUE SHIELD | Source: Ambulatory Visit | Attending: Orthopedic Surgery | Admitting: Orthopedic Surgery

## 2016-12-25 ENCOUNTER — Encounter (HOSPITAL_COMMUNITY): Payer: Self-pay

## 2016-12-25 DIAGNOSIS — M549 Dorsalgia, unspecified: Secondary | ICD-10-CM | POA: Diagnosis not present

## 2016-12-25 DIAGNOSIS — Z9851 Tubal ligation status: Secondary | ICD-10-CM | POA: Diagnosis not present

## 2016-12-25 DIAGNOSIS — S83242A Other tear of medial meniscus, current injury, left knee, initial encounter: Secondary | ICD-10-CM | POA: Diagnosis present

## 2016-12-25 DIAGNOSIS — I10 Essential (primary) hypertension: Secondary | ICD-10-CM | POA: Diagnosis not present

## 2016-12-25 DIAGNOSIS — E7800 Pure hypercholesterolemia, unspecified: Secondary | ICD-10-CM | POA: Diagnosis not present

## 2016-12-25 DIAGNOSIS — F329 Major depressive disorder, single episode, unspecified: Secondary | ICD-10-CM | POA: Diagnosis not present

## 2016-12-25 DIAGNOSIS — F419 Anxiety disorder, unspecified: Secondary | ICD-10-CM | POA: Diagnosis not present

## 2016-12-25 DIAGNOSIS — F17210 Nicotine dependence, cigarettes, uncomplicated: Secondary | ICD-10-CM | POA: Diagnosis not present

## 2016-12-25 DIAGNOSIS — M2242 Chondromalacia patellae, left knee: Secondary | ICD-10-CM | POA: Diagnosis not present

## 2016-12-25 DIAGNOSIS — G8929 Other chronic pain: Secondary | ICD-10-CM | POA: Diagnosis not present

## 2016-12-25 DIAGNOSIS — K219 Gastro-esophageal reflux disease without esophagitis: Secondary | ICD-10-CM | POA: Diagnosis not present

## 2016-12-25 DIAGNOSIS — E119 Type 2 diabetes mellitus without complications: Secondary | ICD-10-CM | POA: Diagnosis not present

## 2016-12-25 HISTORY — DX: Unspecified osteoarthritis, unspecified site: M19.90

## 2016-12-25 LAB — CBC WITH DIFFERENTIAL/PLATELET
Basophils Absolute: 0 10*3/uL (ref 0.0–0.1)
Basophils Relative: 1 %
Eosinophils Absolute: 0.2 10*3/uL (ref 0.0–0.7)
Eosinophils Relative: 2 %
HCT: 33.7 % — ABNORMAL LOW (ref 36.0–46.0)
Hemoglobin: 10.2 g/dL — ABNORMAL LOW (ref 12.0–15.0)
Lymphocytes Relative: 31 %
Lymphs Abs: 2.4 10*3/uL (ref 0.7–4.0)
MCH: 23.9 pg — ABNORMAL LOW (ref 26.0–34.0)
MCHC: 30.3 g/dL (ref 30.0–36.0)
MCV: 79.1 fL (ref 78.0–100.0)
Monocytes Absolute: 0.5 10*3/uL (ref 0.1–1.0)
Monocytes Relative: 7 %
Neutro Abs: 4.7 10*3/uL (ref 1.7–7.7)
Neutrophils Relative %: 59 %
Platelets: 318 10*3/uL (ref 150–400)
RBC: 4.26 MIL/uL (ref 3.87–5.11)
RDW: 15.5 % (ref 11.5–15.5)
WBC: 7.8 10*3/uL (ref 4.0–10.5)

## 2016-12-25 LAB — BASIC METABOLIC PANEL
Anion gap: 8 (ref 5–15)
BUN: 8 mg/dL (ref 6–20)
CO2: 25 mmol/L (ref 22–32)
Calcium: 9 mg/dL (ref 8.9–10.3)
Chloride: 103 mmol/L (ref 101–111)
Creatinine, Ser: 0.54 mg/dL (ref 0.44–1.00)
GFR calc Af Amer: >60 mL/min (ref >60)
GFR calc non Af Amer: >60 mL/min (ref >60)
Glucose, Bld: 199 mg/dL — ABNORMAL HIGH (ref 65–99)
Potassium: 3.7 mmol/L (ref 3.5–5.1)
Sodium: 136 mmol/L (ref 135–145)

## 2016-12-25 LAB — SURGICAL PCR SCREEN
MRSA, PCR: NEGATIVE
Staphylococcus aureus: NEGATIVE

## 2016-12-25 LAB — HCG, SERUM, QUALITATIVE: Preg, Serum: NEGATIVE

## 2016-12-26 NOTE — Pre-Procedure Instructions (Signed)
Dr Jayme CloudGonzalez aware of glucose of 199. Will do fasting CBG on arrival 12/27/2016. No orders given for Hgb of 10.2.

## 2016-12-26 NOTE — H&P (Signed)
PREOP  OFFICE VISIT      Left knee pain     Tina Pham is a 41 y.o. female who has had increasing pain of the left knee.  She has giving way now and increased pain with no locking or redness.  The giving way is a problem.  She has no new trauma.  She has not been improved with rest, medicine or rubs.     Review of Systems  Constitutional: Negative.   Respiratory: Negative.   Cardiovascular: Negative.   Musculoskeletal: Positive for back pain.  Neurological: Negative for tingling.        Past Medical History:  Diagnosis Date  . Anxiety    . Chronic back pain    . Depression    . Diabetes mellitus    . High cholesterol    . Hypertension    . Migraine    . Neuropathic pain of foot    . Plantar fasciitis    . Sciatica    . Torn rotator cuff             Past Surgical History:  Procedure Laterality Date  . BACK SURGERY      . EYE SURGERY      . TUBAL LIGATION               Family History  Problem Relation Age of Onset  . Heart failure Mother    . COPD Mother    . Diabetes Mother    . Heart failure Father    . Diabetes Other            Social History  Substance Use Topics  . Smoking status: Current Every Day Smoker      Packs/day: 0.50      Years: 20.00      Types: Cigarettes  . Smokeless tobacco: Never Used  . Alcohol use No        Comment: occ      BP (!) 132/91   Pulse (!) 110   Wt 238 lb (108 kg)   BMI 36.19 kg/m    Physical Exam  Constitutional: She is oriented to person, place, and time. She appears well-developed and well-nourished.  Neurological: She is alert and oriented to person, place, and time. Gait abnormal.  Psychiatric: She has a normal mood and affect.  Vitals reviewed.     Right Knee Exam    Tenderness  The patient is experiencing no tenderness.        Range of Motion  Extension: normal  Flexion: normal    Muscle Strength    The patient has normal right knee strength.   Tests  McMurray:  Medial - negative  Lateral - negative Drawer:       Anterior - negative    Posterior - negative Varus: negative Valgus: negative   Other  Erythema: absent Scars: absent Sensation: normal Pulse: present Swelling: none     Left Knee Exam    Tenderness  The patient is experiencing tenderness in the medial joint line.   Range of Motion  Extension:  5 normal  Flexion: normal    Muscle Strength    The patient has normal left knee strength.   Tests  McMurray:  Medial - positive Lateral - negative Drawer:       Anterior - negative     Posterior - negative Varus: negative Valgus: negative   Other  Erythema: absent Scars: absent Sensation: normal Pulse: present Swelling: none  Meds ordered this encounter  Medications  . HYDROcodone-acetaminophen (NORCO) 5-325 MG tablet      Sig: Take 1 tablet by mouth every 8 (eight) hours as needed for moderate pain.      Dispense:  21 tablet      Refill:  0          Encounter Diagnoses  Name Primary?  . Chronic pain of left knee Yes  . Cigarette nicotine dependence without complication    . Acute medial meniscus tear of left knee, subsequent encounter          PLAN:  Surgical arthroscopy left knee with MEDIAL MENISECTOMY    We discussed the condition of her knee including the arthritis with chondral changes and tenderness over the medial femoral condyle which she will still have discomfort with after. She also has sciatica which is chronic and will continue to give her some posterior leg and knee pain as well.   This procedure has been fully reviewed with the patient and written informed consent has been obtained.

## 2016-12-27 ENCOUNTER — Ambulatory Visit (HOSPITAL_COMMUNITY)
Admission: RE | Admit: 2016-12-27 | Discharge: 2016-12-27 | Disposition: A | Discharge status: Home / Self Care | Payer: BLUE CROSS/BLUE SHIELD | Source: Ambulatory Visit | Attending: Orthopedic Surgery | Admitting: Orthopedic Surgery

## 2016-12-27 ENCOUNTER — Encounter (HOSPITAL_COMMUNITY)
Admission: RE | Disposition: A | Discharge status: Home / Self Care | Payer: Self-pay | Source: Ambulatory Visit | Attending: Orthopedic Surgery

## 2016-12-27 ENCOUNTER — Ambulatory Visit (HOSPITAL_COMMUNITY): Payer: BLUE CROSS/BLUE SHIELD | Admitting: Anesthesiology

## 2016-12-27 ENCOUNTER — Encounter (HOSPITAL_COMMUNITY): Payer: Self-pay

## 2016-12-27 DIAGNOSIS — G8929 Other chronic pain: Secondary | ICD-10-CM

## 2016-12-27 DIAGNOSIS — E7800 Pure hypercholesterolemia, unspecified: Secondary | ICD-10-CM | POA: Insufficient documentation

## 2016-12-27 DIAGNOSIS — E119 Type 2 diabetes mellitus without complications: Secondary | ICD-10-CM | POA: Insufficient documentation

## 2016-12-27 DIAGNOSIS — F329 Major depressive disorder, single episode, unspecified: Secondary | ICD-10-CM | POA: Insufficient documentation

## 2016-12-27 DIAGNOSIS — M2242 Chondromalacia patellae, left knee: Secondary | ICD-10-CM | POA: Insufficient documentation

## 2016-12-27 DIAGNOSIS — I10 Essential (primary) hypertension: Secondary | ICD-10-CM | POA: Insufficient documentation

## 2016-12-27 DIAGNOSIS — M25562 Pain in left knee: Secondary | ICD-10-CM

## 2016-12-27 DIAGNOSIS — F419 Anxiety disorder, unspecified: Secondary | ICD-10-CM | POA: Insufficient documentation

## 2016-12-27 DIAGNOSIS — K219 Gastro-esophageal reflux disease without esophagitis: Secondary | ICD-10-CM | POA: Insufficient documentation

## 2016-12-27 DIAGNOSIS — S83242D Other tear of medial meniscus, current injury, left knee, subsequent encounter: Secondary | ICD-10-CM

## 2016-12-27 DIAGNOSIS — M549 Dorsalgia, unspecified: Secondary | ICD-10-CM | POA: Insufficient documentation

## 2016-12-27 DIAGNOSIS — Z9851 Tubal ligation status: Secondary | ICD-10-CM | POA: Insufficient documentation

## 2016-12-27 DIAGNOSIS — F17210 Nicotine dependence, cigarettes, uncomplicated: Secondary | ICD-10-CM | POA: Insufficient documentation

## 2016-12-27 HISTORY — PX: KNEE ARTHROSCOPY WITH MEDIAL MENISECTOMY: SHX5651

## 2016-12-27 LAB — GLUCOSE, CAPILLARY
Glucose-Capillary: 184 mg/dL — ABNORMAL HIGH (ref 65–99)
Glucose-Capillary: 151 mg/dL — ABNORMAL HIGH (ref 65–99)

## 2016-12-27 SURGERY — ARTHROSCOPY, KNEE, WITH MEDIAL MENISCECTOMY
Anesthesia: General | Site: Knee | Laterality: Left

## 2016-12-27 MED ORDER — PROPOFOL 10 MG/ML IV BOLUS
INTRAVENOUS | Status: AC
  Filled 2016-12-27: qty 20

## 2016-12-27 MED ORDER — LIDOCAINE HCL (PF) 1 % IJ SOLN
INTRAMUSCULAR | Status: AC
Start: 2016-12-27 — End: 2016-12-27
  Filled 2016-12-27: qty 5

## 2016-12-27 MED ORDER — SUCCINYLCHOLINE CHLORIDE 20 MG/ML IJ SOLN
INTRAMUSCULAR | Status: DC | PRN
  Administered 2016-12-27: 180 mg via INTRAVENOUS

## 2016-12-27 MED ORDER — GLYCOPYRROLATE 0.2 MG/ML IJ SOLN
INTRAMUSCULAR | Status: DC | PRN
  Administered 2016-12-27: 0.6 mg via INTRAVENOUS

## 2016-12-27 MED ORDER — PROPOFOL 10 MG/ML IV BOLUS
INTRAVENOUS | Status: DC | PRN
  Administered 2016-12-27: 175 mg via INTRAVENOUS

## 2016-12-27 MED ORDER — MIDAZOLAM HCL 2 MG/2ML IJ SOLN
INTRAMUSCULAR | Status: AC
  Filled 2016-12-27: qty 2

## 2016-12-27 MED ORDER — LACTATED RINGERS IV SOLN
INTRAVENOUS | Status: DC
  Administered 2016-12-27 (×2): via INTRAVENOUS

## 2016-12-27 MED ORDER — CEFAZOLIN SODIUM-DEXTROSE 2-4 GM/100ML-% IV SOLN
2.0000 g | INTRAVENOUS | Status: AC
  Administered 2016-12-27: 2 g via INTRAVENOUS
  Filled 2016-12-27: qty 100

## 2016-12-27 MED ORDER — FENTANYL CITRATE (PF) 100 MCG/2ML IJ SOLN
25.0000 ug | INTRAMUSCULAR | Status: DC | PRN
  Administered 2016-12-27 (×2): 50 ug via INTRAVENOUS
  Filled 2016-12-27: qty 2

## 2016-12-27 MED ORDER — LIDOCAINE HCL 1 % IJ SOLN
INTRAMUSCULAR | Status: DC | PRN
  Administered 2016-12-27: 25 mg via INTRADERMAL

## 2016-12-27 MED ORDER — SUCCINYLCHOLINE CHLORIDE 20 MG/ML IJ SOLN
INTRAMUSCULAR | Status: AC
  Filled 2016-12-27: qty 1

## 2016-12-27 MED ORDER — ONDANSETRON HCL 4 MG/2ML IJ SOLN
4.0000 mg | Freq: Once | INTRAMUSCULAR | Status: AC
  Administered 2016-12-27: 4 mg via INTRAVENOUS

## 2016-12-27 MED ORDER — MIDAZOLAM HCL 5 MG/5ML IJ SOLN
INTRAMUSCULAR | Status: DC | PRN
  Administered 2016-12-27: 2 mg via INTRAVENOUS

## 2016-12-27 MED ORDER — FENTANYL CITRATE (PF) 100 MCG/2ML IJ SOLN
INTRAMUSCULAR | Status: AC
  Filled 2016-12-27: qty 4

## 2016-12-27 MED ORDER — BUPIVACAINE-EPINEPHRINE (PF) 0.5% -1:200000 IJ SOLN
INTRAMUSCULAR | Status: AC
  Filled 2016-12-27: qty 60

## 2016-12-27 MED ORDER — SODIUM CHLORIDE 0.9 % IR SOLN
Status: DC | PRN
  Administered 2016-12-27 (×2): 3000 mL

## 2016-12-27 MED ORDER — ONDANSETRON HCL 4 MG/2ML IJ SOLN
4.0000 mg | Freq: Once | INTRAMUSCULAR | Status: AC
  Administered 2016-12-27: 4 mg via INTRAVENOUS
  Filled 2016-12-27: qty 2

## 2016-12-27 MED ORDER — HYDROCODONE-ACETAMINOPHEN 5-325 MG PO TABS: 1.0000 | ORAL_TABLET | ORAL | 0 refills | Status: DC | PRN

## 2016-12-27 MED ORDER — ONDANSETRON HCL 4 MG/2ML IJ SOLN
INTRAMUSCULAR | Status: AC
  Filled 2016-12-27: qty 2

## 2016-12-27 MED ORDER — CHLORHEXIDINE GLUCONATE 4 % EX LIQD: 60.0000 mL | Freq: Once | CUTANEOUS | Status: DC

## 2016-12-27 MED ORDER — FENTANYL CITRATE (PF) 100 MCG/2ML IJ SOLN
INTRAMUSCULAR | Status: DC | PRN
  Administered 2016-12-27: 100 ug via INTRAVENOUS
  Administered 2016-12-27 (×2): 50 ug via INTRAVENOUS

## 2016-12-27 MED ORDER — BUPIVACAINE-EPINEPHRINE (PF) 0.5% -1:200000 IJ SOLN
INTRAMUSCULAR | Status: DC | PRN
  Administered 2016-12-27 (×2): 30 mL

## 2016-12-27 MED ORDER — GLYCOPYRROLATE 0.2 MG/ML IJ SOLN
INTRAMUSCULAR | Status: AC
  Filled 2016-12-27: qty 2

## 2016-12-27 MED ORDER — MIDAZOLAM HCL 2 MG/2ML IJ SOLN
1.0000 mg | INTRAMUSCULAR | Status: AC
  Administered 2016-12-27 (×2): 2 mg via INTRAVENOUS
  Filled 2016-12-27: qty 2

## 2016-12-27 MED ORDER — ROCURONIUM BROMIDE 50 MG/5ML IV SOLN
INTRAVENOUS | Status: AC
  Filled 2016-12-27: qty 1

## 2016-12-27 MED ORDER — EPINEPHRINE PF 1 MG/ML IJ SOLN
INTRAMUSCULAR | Status: AC
  Filled 2016-12-27: qty 3

## 2016-12-27 MED ORDER — NEOSTIGMINE METHYLSULFATE 10 MG/10ML IV SOLN
INTRAVENOUS | Status: DC | PRN
  Administered 2016-12-27: 2 mg via INTRAVENOUS

## 2016-12-27 MED ORDER — HYDROCODONE-ACETAMINOPHEN 5-325 MG PO TABS
1.0000 | ORAL_TABLET | Freq: Once | ORAL | Status: AC
  Administered 2016-12-27: 1 via ORAL
  Filled 2016-12-27: qty 1

## 2016-12-27 MED ORDER — ROCURONIUM BROMIDE 100 MG/10ML IV SOLN
INTRAVENOUS | Status: DC | PRN
  Administered 2016-12-27 (×2): 10 mg via INTRAVENOUS
  Administered 2016-12-27: 20 mg via INTRAVENOUS

## 2016-12-27 SURGICAL SUPPLY — 45 items
BAG HAMPER (MISCELLANEOUS) ×3 IMPLANT
BANDAGE ELASTIC 6 LF NS (GAUZE/BANDAGES/DRESSINGS) ×3 IMPLANT
BLADE AGGRESSIVE PLUS 4.0 (BLADE) ×3 IMPLANT
BLADE SURG SZ11 CARB STEEL (BLADE) ×3 IMPLANT
CHLORAPREP W/TINT 26ML (MISCELLANEOUS) ×3 IMPLANT
CLOTH BEACON ORANGE TIMEOUT ST (SAFETY) ×3 IMPLANT
COOLER CRYO IC GRAV AND TUBE (ORTHOPEDIC SUPPLIES) ×3 IMPLANT
CUFF CRYO KNEE18X23 MED (MISCELLANEOUS) ×3 IMPLANT
CUFF TOURNIQUET SINGLE 34IN LL (TOURNIQUET CUFF) ×3 IMPLANT
DECANTER SPIKE VIAL GLASS SM (MISCELLANEOUS) ×6 IMPLANT
GAUZE SPONGE 4X4 12PLY STRL (GAUZE/BANDAGES/DRESSINGS) ×3 IMPLANT
GAUZE SPONGE 4X4 16PLY XRAY LF (GAUZE/BANDAGES/DRESSINGS) ×3 IMPLANT
GAUZE XEROFORM 5X9 LF (GAUZE/BANDAGES/DRESSINGS) ×3 IMPLANT
GLOVE BIO SURGEON STRL SZ7 (GLOVE) ×3 IMPLANT
GLOVE BIOGEL PI IND STRL 7.0 (GLOVE) ×1 IMPLANT
GLOVE BIOGEL PI INDICATOR 7.0 (GLOVE) ×2
GLOVE SKINSENSE NS SZ8.0 LF (GLOVE) ×2
GLOVE SKINSENSE STRL SZ8.0 LF (GLOVE) ×1 IMPLANT
GLOVE SS N UNI LF 8.5 STRL (GLOVE) ×3 IMPLANT
GOWN STRL REUS W/ TWL LRG LVL3 (GOWN DISPOSABLE) ×1 IMPLANT
GOWN STRL REUS W/TWL LRG LVL3 (GOWN DISPOSABLE) ×2
GOWN STRL REUS W/TWL XL LVL3 (GOWN DISPOSABLE) ×3 IMPLANT
HLDR LEG FOAM (MISCELLANEOUS) ×1 IMPLANT
IV NS IRRIG 3000ML ARTHROMATIC (IV SOLUTION) ×6 IMPLANT
KIT BLADEGUARD II DBL (SET/KITS/TRAYS/PACK) ×3 IMPLANT
KIT ROOM TURNOVER AP CYSTO (KITS) ×3 IMPLANT
LEG HOLDER FOAM (MISCELLANEOUS) ×2
MANIFOLD NEPTUNE II (INSTRUMENTS) ×3 IMPLANT
MARKER SKIN DUAL TIP RULER LAB (MISCELLANEOUS) ×3 IMPLANT
NEEDLE HYPO 18GX1.5 BLUNT FILL (NEEDLE) ×3 IMPLANT
NEEDLE HYPO 21X1.5 SAFETY (NEEDLE) ×3 IMPLANT
NEEDLE SPNL 18GX3.5 QUINCKE PK (NEEDLE) ×3 IMPLANT
NS IRRIG 1000ML POUR BTL (IV SOLUTION) ×3 IMPLANT
PACK ARTHRO LIMB DRAPE STRL (MISCELLANEOUS) ×3 IMPLANT
PAD ABD 5X9 TENDERSORB (GAUZE/BANDAGES/DRESSINGS) ×3 IMPLANT
PAD ARMBOARD 7.5X6 YLW CONV (MISCELLANEOUS) ×3 IMPLANT
PADDING CAST COTTON 6X4 STRL (CAST SUPPLIES) ×3 IMPLANT
SET ARTHROSCOPY INST (INSTRUMENTS) ×3 IMPLANT
SET ARTHROSCOPY PUMP TUBE (IRRIGATION / IRRIGATOR) ×3 IMPLANT
SET BASIN LINEN APH (SET/KITS/TRAYS/PACK) ×3 IMPLANT
SUT ETHILON 3 0 FSL (SUTURE) ×3 IMPLANT
SYR 30ML LL (SYRINGE) ×3 IMPLANT
SYRINGE 10CC LL (SYRINGE) ×3 IMPLANT
TUBE CONNECTING 12'X1/4 (SUCTIONS) ×3
TUBE CONNECTING 12X1/4 (SUCTIONS) ×6 IMPLANT

## 2016-12-27 NOTE — Anesthesia Postprocedure Evaluation (Signed)
Anesthesia Post Note  Patient: Tina ReelLaura W Pham  Procedure(s) Performed: Procedure(s) (LRB): LEFT KNEE DIAGNOSTIC ARTHROSCOPY (Left)  Patient location during evaluation: PACU Anesthesia Type: General Level of consciousness: awake and alert, oriented and patient cooperative Pain management: pain level controlled Vital Signs Assessment: post-procedure vital signs reviewed and stable Respiratory status: spontaneous breathing, nonlabored ventilation and respiratory function stable Cardiovascular status: blood pressure returned to baseline Postop Assessment: no signs of nausea or vomiting Anesthetic complications: no     Last Vitals:  Vitals:   12/27/16 1115 12/27/16 1130  BP: 119/66 (!) 108/49  Pulse:    Resp: (!) 0 (!) 0  Temp:      Last Pain:  Vitals:   12/27/16 0844  TempSrc: Oral  PainSc: 8                  Thomasine Klutts J

## 2016-12-27 NOTE — Transfer of Care (Signed)
Immediate Anesthesia Transfer of Care Note  Patient: Tina ReelLaura W Pham  Procedure(s) Performed: Procedure(s): LEFT KNEE DIAGNOSTIC ARTHROSCOPY (Left)  Patient Location: PACU  Anesthesia Type:General  Level of Consciousness: awake, alert  and patient cooperative  Airway & Oxygen Therapy: Patient Spontanous Breathing and Patient connected to face mask oxygen  Post-op Assessment: Report given to RN, Post -op Vital signs reviewed and stable and Patient moving all extremities  Post vital signs: Reviewed and stable  Last Vitals:  Vitals:   12/27/16 1115 12/27/16 1130  BP: 119/66 (!) 108/49  Pulse:    Resp: (!) 0 (!) 0  Temp:      Last Pain:  Vitals:   12/27/16 0844  TempSrc: Oral  PainSc: 8          Complications: No apparent anesthesia complications

## 2016-12-27 NOTE — Discharge Instructions (Signed)
PATIENT INSTRUCTIONS °POST-ANESTHESIA ° °IMMEDIATELY FOLLOWING SURGERY:  Do not drive or operate machinery for the first twenty four hours after surgery.  Do not make any important decisions for twenty four hours after surgery or while taking narcotic pain medications or sedatives.  If you develop intractable nausea and vomiting or a severe headache please notify your doctor immediately. ° °FOLLOW-UP:  Please make an appointment with your surgeon as instructed. You do not need to follow up with anesthesia unless specifically instructed to do so. ° °WOUND CARE INSTRUCTIONS (if applicable):  Keep a dry clean dressing on the anesthesia/puncture wound site if there is drainage.  Once the wound has quit draining you may leave it open to air.  Generally you should leave the bandage intact for twenty four hours unless there is drainage.  If the epidural site drains for more than 36-48 hours please call the anesthesia department. ° °QUESTIONS?:  Please feel free to call your physician or the hospital operator if you have any questions, and they will be happy to assist you.    ° ° ° °Knee Arthroscopy, Care After °Refer to this sheet in the next few weeks. These instructions provide you with information about caring for yourself after your procedure. Your health care provider may also give you more specific instructions. Your treatment has been planned according to current medical practices, but problems sometimes occur. Call your health care provider if you have any problems or questions after your procedure. °What can I expect after the procedure? °After the procedure, it is common to have: °· Soreness. °· Pain. ° °Follow these instructions at home: °Bathing °· Do not take baths, swim, or use a hot tub until your health care provider approves. °Incision care °· There are many different ways to close and cover an incision, including stitches, skin glue, and adhesive strips. Follow your health care provider’s instructions  about: °? Incision care. °? Bandage (dressing) changes and removal. °? Incision closure removal. °· Check your incision area every day for signs of infection. Watch for: °? Redness, swelling, or pain. °? Fluid, blood, or pus. °Activity °· Avoid strenuous activities for as long as directed by your health care provider. °· Return to your normal activities as directed by your health care provider. Ask your health care provider what activities are safe for you. °· Perform range-of-motion exercises only as directed by your health care provider. °· Do not lift anything that is heavier than 10 lb (4.5 kg). °· Do not drive or operate heavy machinery while taking pain medicine. °· If you were given crutches, use them as directed by your health care provider. °Managing pain, stiffness, and swelling °· If directed, apply ice to the injured area: °? Put ice in a plastic bag. °? Place a towel between your skin and the bag. °? Leave the ice on for 20 minutes, 2-3 times per day. °· Raise the injured area above the level of your heart while you are sitting or lying down as directed by your health care provider. °General instructions °· Keep all follow-up visits as directed by your health care provider. This is important. °· Take medicines only as directed by your health care provider. °· Do not use any tobacco products, including cigarettes, chewing tobacco, or electronic cigarettes. If you need help quitting, ask your health care provider. °· If you were given compression stockings, wear them as directed by your health care provider. These stockings help prevent blood clots and reduce swelling in your legs. °  Contact a health care provider if: °· You have severe pain with any movement of your knee. °· You notice a bad smell coming from the incision or dressing. °· You have redness, swelling, or pain at the site of your incision. °· You have fluid, blood, or pus coming from your incision. °Get help right away if: °· You develop a  rash. °· You have a fever. °· You have difficulty breathing or have shortness of breath. °· You develop pain in your calves or in the back of your knee. °· You develop chest pain. °· You develop numbness or tingling in your leg or foot. °This information is not intended to replace advice given to you by your health care provider. Make sure you discuss any questions you have with your health care provider. °Document Released: 01/27/2005 Document Revised: 12/10/2015 Document Reviewed: 07/06/2014 °Elsevier Interactive Patient Education © 2017 [employer] ° ° °

## 2016-12-27 NOTE — Op Note (Signed)
12/27/2016  12:56 PM  PATIENT:  Tina Pham  41 y.o. 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 The chart was reviewed and updated. The surgical site was confirmed as LEFT knee and marked with an indelible marker.  The patient was taken to the operating room for anesthesia. After successful  GENERAL anesthesia, 2GM was used as IV antibiotics.  The patient was placed in the supine position with the (LEFT) the operative extremity in an arthroscopic leg holder and the opposite extremity in a padded leg holder.  The timeout was executed.  A lateral portal was established with an 11 blade and the scope was introduced into the joint. A diagnostic arthroscopy was performed in circumferential manner examining the entire knee joint. A medial portal was established and the diagnostic arthroscopy was repeated using a probe to palpate intra-articular structures as they were encountered.    The medial meniscus was probed several times lifted up and looked underneath and there was no tear a recheck the meniscal root it was intact  The mild chondromalacia of the medial patellar facet and lateral tibial plateau did not require any treatments  The arthroscopic pump was placed on the wash mode and any excess debris was removed from the joint using suction.  60 cc of Marcaine with epinephrine was injected through the arthroscope.  The portals were closed with 3-0 nylon suture.  A sterile bandage, Ace wrap and Cryo/Cuff was placed and the Cryo/Cuff was  activated. The patient was taken to the recovery room in stable condition.   SURGEON:  Surgeon(s) and Role:    * Vickki HearingHarrison, Noelle Sease E, MD - Primary  PHYSICIAN ASSISTANT:   ASSISTANTS: none   ANESTHESIA:   general  EBL:  Total I/O In: 200 [I.V.:200] Out: -   BLOOD ADMINISTERED:none  DRAINS: none   LOCAL MEDICATIONS USED:  MARCAINE     SPECIMEN:  No Specimen  DISPOSITION OF SPECIMEN:  N/A  COUNTS:  YES  TOURNIQUET:    DICTATION: .Dragon Dictation  PLAN OF CARE: Discharge to home after PACU  PATIENT DISPOSITION:  PACU - hemodynamically stable.   Delay start of Pharmacological VTE agent (>24hrs) due to surgical blood loss or risk of bleeding: not applicable  [id number]

## 2016-12-27 NOTE — Anesthesia Preprocedure Evaluation (Signed)
Anesthesia Evaluation  Patient identified by MRN, date of birth, ID band  Reviewed: Allergy & Precautions, NPO status , Patient's Chart, lab work & pertinent test results  Airway Mallampati: I  TM Distance: >3 FB Neck ROM: Full    Dental  (+) Edentulous Upper, Edentulous Lower   Pulmonary Current Smoker,    breath sounds clear to auscultation       Cardiovascular hypertension,  Rhythm:Regular Rate:Normal     Neuro/Psych  Headaches, PSYCHIATRIC DISORDERS Anxiety Depression  Neuromuscular disease ( Neuropathic pain of foot, sciatica)    GI/Hepatic GERD  ,  Endo/Other  diabetes, Type 2  Renal/GU      Musculoskeletal   Abdominal   Peds  Hematology   Anesthesia Other Findings   Reproductive/Obstetrics                             Anesthesia Physical Anesthesia Plan  ASA: III  Anesthesia Plan: General   Post-op Pain Management:    Induction: Intravenous, Rapid sequence and Cricoid pressure planned  PONV Risk Score and Plan:   Airway Management Planned: Oral ETT  Additional Equipment:   Intra-op Plan:   Post-operative Plan: Extubation in OR  Informed Consent: I have reviewed the patients History and Physical, chart, labs and discussed the procedure including the risks, benefits and alternatives for the proposed anesthesia with the patient or authorized representative who has indicated his/her understanding and acceptance.     Plan Discussed with:   Anesthesia Plan Comments:         Anesthesia Quick Evaluation

## 2016-12-27 NOTE — Brief Op Note (Addendum)
12/27/2016  12:56 PM  PATIENT:  Tina Pham  41 y.o. 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 The chart was reviewed and updated. The surgical site was confirmed as LEFT knee and marked with an indelible marker.  The patient was taken to the operating room for anesthesia. After successful  GENERAL anesthesia, 2GM was used as IV antibiotics.  The patient was placed in the supine position with the (LEFT) the operative extremity in an arthroscopic leg holder and the opposite extremity in a padded leg holder.  The timeout was executed.  A lateral portal was established with an 11 blade and the scope was introduced into the joint. A diagnostic arthroscopy was performed in circumferential manner examining the entire knee joint. A medial portal was established and the diagnostic arthroscopy was repeated using a probe to palpate intra-articular structures as they were encountered.    The medial meniscus was probed several times lifted up and looked underneath and there was no tear a recheck the meniscal root it was intact  The mild chondromalacia of the medial patellar facet and lateral tibial plateau did not require any treatments  The arthroscopic pump was placed on the wash mode and any excess debris was removed from the joint using suction.  60 cc of Marcaine with epinephrine was injected through the arthroscope.  The portals were closed with 3-0 nylon suture.  A sterile bandage, Ace wrap and Cryo/Cuff was placed and the Cryo/Cuff was  activated. The patient was taken to the recovery room in stable condition.   SURGEON:  Surgeon(s) and Role:    * Vickki HearingHarrison, Stanley E, MD - Primary  PHYSICIAN ASSISTANT:   ASSISTANTS: none   ANESTHESIA:   general  EBL:  Total I/O In: 200 [I.V.:200] Out: -   BLOOD ADMINISTERED:none  DRAINS: none   LOCAL MEDICATIONS USED:  MARCAINE     SPECIMEN:  No Specimen  DISPOSITION OF SPECIMEN:  N/A  COUNTS:  YES  TOURNIQUET:    DICTATION: .Dragon Dictation  PLAN OF CARE: Discharge to home after PACU  PATIENT DISPOSITION:  PACU - hemodynamically stable.   Delay start of Pharmacological VTE agent (>24hrs) due to surgical blood loss or risk of bleeding: not applicable

## 2016-12-27 NOTE — Anesthesia Procedure Notes (Signed)
Procedure Name: Intubation Date/Time: 12/27/2016 12:18 PM Performed by: Charmaine Downs Pre-anesthesia Checklist: Patient identified, Patient being monitored, Timeout performed, Emergency Drugs available and Suction available Patient Re-evaluated:Patient Re-evaluated prior to inductionOxygen Delivery Method: Circle System Utilized Preoxygenation: Pre-oxygenation with 100% oxygen Intubation Type: IV induction, Rapid sequence and Cricoid Pressure applied Ventilation: Mask ventilation without difficulty Laryngoscope Size: Mac and 3 Grade View: Grade I Tube type: Oral Tube size: 7.0 mm Number of attempts: 1 Airway Equipment and Method: stylet Placement Confirmation: ETT inserted through vocal cords under direct vision,  positive ETCO2 and breath sounds checked- equal and bilateral Secured at: 22 cm Tube secured with: Tape Dental Injury: Teeth and Oropharynx as per pre-operative assessment

## 2016-12-27 NOTE — Interval H&P Note (Signed)
History and Physical Interval Note:  12/27/2016 10:01 AM  Tina Pham  has presented today for surgery, with the diagnosis of left medial meniscus tear  The various methods of treatment have been discussed with the patient and family. After consideration of risks, benefits and other options for treatment, the patient has consented to  Procedure(s) with comments: KNEE ARTHROSCOPY WITH MEDIAL MENISECTOMY (Left) - pt knows to arrive at 8:45 as a surgical intervention .  The patient's history has been reviewed, patient examined, no change in status, stable for surgery.  I have reviewed the patient's chart and labs.  Questions were answered to the patient's satisfaction.     Fuller CanadaStanley Harrison

## 2016-12-28 ENCOUNTER — Encounter (HOSPITAL_COMMUNITY): Payer: Self-pay | Admitting: Orthopedic Surgery

## 2016-12-29 ENCOUNTER — Telehealth: Payer: Self-pay | Admitting: Orthopedic Surgery

## 2016-12-29 NOTE — Telephone Encounter (Signed)
ADVISED TO ADD IBUPROFEN, PATIENT AGREES

## 2016-12-29 NOTE — Telephone Encounter (Signed)
Patient states she is having pain and her pain meds are not helping.  She is icing and elevating the knee but that does not help.  She wants to know what else she can do

## 2017-01-02 ENCOUNTER — Ambulatory Visit (INDEPENDENT_AMBULATORY_CARE_PROVIDER_SITE_OTHER): Payer: Self-pay | Admitting: Orthopedic Surgery

## 2017-01-02 ENCOUNTER — Encounter: Payer: Self-pay | Admitting: Orthopedic Surgery

## 2017-01-02 DIAGNOSIS — Z9889 Other specified postprocedural states: Secondary | ICD-10-CM

## 2017-01-02 DIAGNOSIS — S83242D Other tear of medial meniscus, current injury, left knee, subsequent encounter: Secondary | ICD-10-CM

## 2017-01-02 DIAGNOSIS — G8929 Other chronic pain: Secondary | ICD-10-CM

## 2017-01-02 DIAGNOSIS — M25562 Pain in left knee: Secondary | ICD-10-CM

## 2017-01-02 DIAGNOSIS — Z4889 Encounter for other specified surgical aftercare: Secondary | ICD-10-CM

## 2017-01-02 MED ORDER — HYDROCODONE-ACETAMINOPHEN 5-325 MG PO TABS: 1.0000 | ORAL_TABLET | ORAL | 0 refills | Status: DC | PRN

## 2017-01-02 NOTE — Progress Notes (Signed)
Postop visit #1 status post knee arthroscopy  Chief Complaint  Patient presents with  . Follow-up    SALK MM 12/27/16     41 year old female 6 days postop from a left knee arthroscopy at which time we did not find any meniscal tear we found some chondromalacia of the medial patella and lateral femoral condyle  She is still complaining of some pain in her knee primarily behind the kneecap. She is on a walker walking full weightbearing       Operative report   12/27/2016   12:56 PM   PATIENT:  Tina Pham  41 y.o. 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 HYDROcodone-acetaminophen (NORCO) 5-325 MG tablet Take 1 tablet by mouth every 4 (four) hours as needed for moderate pain.  Marland Kitchen. ibuprofen (ADVIL,MOTRIN) 200 MG tablet Take 800 mg by mouth every 8 (eight) hours as needed (for pain.).   Meds ordered this encounter  Medications  . HYDROcodone-acetaminophen (NORCO) 5-325 MG tablet    Sig: Take 1 tablet by mouth every 4 (four) hours as needed for moderate pain.    Dispense:  42 tablet    Refill:  0     The portal sites are clean dry and intact, the sutures were removed.  The calf was supple soft and the Homans sign was negative.  The patient is regaining knee flexion Equals 115  Status post knee arthroscopy  Follow-up in  3-4 WEEKS

## 2017-01-08 ENCOUNTER — Encounter: Payer: Self-pay | Admitting: Orthopedic Surgery

## 2017-01-08 ENCOUNTER — Other Ambulatory Visit: Payer: Self-pay | Admitting: Orthopedic Surgery

## 2017-01-08 DIAGNOSIS — M25562 Pain in left knee: Principal | ICD-10-CM

## 2017-01-08 DIAGNOSIS — G8929 Other chronic pain: Secondary | ICD-10-CM

## 2017-01-08 DIAGNOSIS — S83242D Other tear of medial meniscus, current injury, left knee, subsequent encounter: Secondary | ICD-10-CM

## 2017-01-08 MED ORDER — HYDROCODONE-ACETAMINOPHEN 5-325 MG PO TABS: 1.0000 | ORAL_TABLET | Freq: Three times a day (TID) | ORAL | 0 refills | Status: DC | PRN

## 2017-01-23 ENCOUNTER — Ambulatory Visit (INDEPENDENT_AMBULATORY_CARE_PROVIDER_SITE_OTHER): Payer: BLUE CROSS/BLUE SHIELD | Admitting: Orthopedic Surgery

## 2017-01-23 ENCOUNTER — Encounter: Payer: Self-pay | Admitting: Orthopedic Surgery

## 2017-01-23 DIAGNOSIS — M549 Dorsalgia, unspecified: Secondary | ICD-10-CM

## 2017-01-23 DIAGNOSIS — Z9889 Other specified postprocedural states: Secondary | ICD-10-CM

## 2017-01-23 DIAGNOSIS — Z4889 Encounter for other specified surgical aftercare: Secondary | ICD-10-CM

## 2017-01-23 NOTE — Addendum Note (Signed)
Addended by: Adella HareBOOTHE, JAIME B on: 01/23/2017 02:05 PM   Modules accepted: Orders

## 2017-01-23 NOTE — Progress Notes (Signed)
41 year old female had a knee arthroscopy which did not show any damage to the knee. She still has left leg pain knee pain and giving way symptoms left knee.  She's had multiple back operations and she says that Dr. Mikal Planeabell will not see her because of some payment issues from her surgery 10 years ago so we will offer her a referral to another doctor. I've released as far as her knee as it was completely clean on arthroscopy

## 2017-02-27 ENCOUNTER — Ambulatory Visit: Payer: BLUE CROSS/BLUE SHIELD | Admitting: Orthopaedic Surgery

## 2017-03-13 ENCOUNTER — Ambulatory Visit: Payer: BLUE CROSS/BLUE SHIELD | Admitting: Orthopaedic Surgery

## 2017-08-08 ENCOUNTER — Encounter: Payer: Self-pay | Admitting: Orthopedic Surgery

## 2017-08-08 ENCOUNTER — Ambulatory Visit (INDEPENDENT_AMBULATORY_CARE_PROVIDER_SITE_OTHER): Payer: BLUE CROSS/BLUE SHIELD

## 2017-08-08 ENCOUNTER — Ambulatory Visit: Payer: BLUE CROSS/BLUE SHIELD | Admitting: Orthopedic Surgery

## 2017-08-08 VITALS — BP 156/83 | HR 105 | Ht 68.0 in | Wt 238.0 lb

## 2017-08-08 DIAGNOSIS — Z9889 Other specified postprocedural states: Secondary | ICD-10-CM | POA: Diagnosis not present

## 2017-08-08 DIAGNOSIS — G8929 Other chronic pain: Secondary | ICD-10-CM | POA: Diagnosis not present

## 2017-08-08 DIAGNOSIS — M2242 Chondromalacia patellae, left knee: Secondary | ICD-10-CM | POA: Diagnosis not present

## 2017-08-08 DIAGNOSIS — M25562 Pain in left knee: Secondary | ICD-10-CM

## 2017-08-08 DIAGNOSIS — M94262 Chondromalacia, left knee: Secondary | ICD-10-CM | POA: Diagnosis not present

## 2017-08-08 MED ORDER — PREDNISONE 10 MG PO TABS: ORAL_TABLET | ORAL | 5 refills | Status: DC

## 2017-08-08 NOTE — Progress Notes (Signed)
Progress Note   Patient ID: Tina Pham, female   DOB: 05/08/1976, 42 y.o.   MRN: [id number]  Chief Complaint  Patient presents with  . Follow-up    left knee, hurting worse than before the surgery (SALK) in 12/27/16    42 year old female had a knee arthroscopy in June I found grade I chondromalacia no ligament injury and no arthritic changes and no cartilage troubles  She presents now with increasing pain swelling and giving way left knee history of lumbar fusion in 2009.  She did not go to the spine specialist that we referred her to because of transportation issues  She complains of swelling which is intermittent ,  appears to be activity related.     Review of Systems  Constitutional: Negative for fever.  Musculoskeletal: Positive for back pain.  Skin: Negative.   Neurological: Positive for focal weakness.   Current Meds  Medication Sig  . acetaminophen (TYLENOL) 325 MG tablet Take 650 mg by mouth every 6 (six) hours as needed.  Marland Kitchen. ibuprofen (ADVIL,MOTRIN) 200 MG tablet Take 800 mg by mouth every 8 (eight) hours as needed (for pain.).  Marland Kitchen. naproxen sodium (ALEVE) 220 MG tablet Take 220 mg by mouth.    Past Medical History:  Diagnosis Date  . Anxiety   . Arthritis   . Chronic back pain   . Depression   . Diabetes mellitus    lost weight and is not on meds now.  . High cholesterol   . Hypertension   . Migraine   . Neuropathic pain of foot   . Plantar fasciitis   . Sciatica   . Torn rotator cuff      Allergies  Allergen Reactions  . Codeine Other (See Comments)    Upset Stomach  . Benadryl [Diphenhydramine Hcl] Palpitations  . Compazine Palpitations    BP (!) 156/83   Pulse (!) 105   Ht 5\' 8"  (1.727 m)   Wt 238 lb (108 kg)   BMI 36.19 kg/m    Physical Exam  Constitutional: She is oriented to person, place, and time. She appears well-developed and well-nourished.  Neurological: She is alert and oriented to person, place, and time.  Psychiatric:  She has a normal mood and affect. Judgment normal.  Vitals reviewed.   Ortho Exam  Reexamination of the lower extremities right knee inspection palpation revealed no abnormalities she had full range of motion there ligaments were stable strength and muscle tone were normal skin was intact without rash no erythema no effusion pulse and perfusion were normal sensation was intact  Left knee tender medial femoral condyle tender over the medial patellar facet with no effusion flexion was painful but passive range of motion was full stability tests were normal strength was intact muscle tone was normal skin was intact pulses are good sensation was normal   Medical decision-making  Imaging: Repeat x-ray showed valgus knee no effusion tibiofemoral articulation normal patellofemoral articulation was normal  Encounter Diagnoses  Name Primary?  . Chronic pain of left knee   . S/P arthroscopy of left knee   . Chondromalacia, patella, left Yes  . 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  I placed her on a Dosepak which she can take as needed when the knee is swollen  She will need referral to the spine Center for evaluation of her prior fusion and giving way symptoms in her left  leg     Fuller Canada, MD 08/08/2017 4:57 PM

## 2017-08-08 NOTE — Patient Instructions (Signed)
Steps to Quit Smoking Smoking tobacco can be bad for your health. It can also affect almost every organ in your body. Smoking puts you and people around you at risk for many serious Tina Pham-lasting (chronic) diseases. Quitting smoking is hard, but it is one of the best things that you can do for your health. It is never too late to quit. What are the benefits of quitting smoking? When you quit smoking, you lower your risk for getting serious diseases and conditions. They can include:  Lung cancer or lung disease.  Heart disease.  Stroke.  Heart attack.  Not being able to have children (infertility).  Weak bones (osteoporosis) and broken bones (fractures).  If you have coughing, wheezing, and shortness of breath, those symptoms may get better when you quit. You may also get sick less often. If you are pregnant, quitting smoking can help to lower your chances of having a baby of low birth weight. What can I do to help me quit smoking? Talk with your doctor about what can help you quit smoking. Some things you can do (strategies) include:  Quitting smoking totally, instead of slowly cutting back how much you smoke over a period of time.  Going to in-person counseling. You are more likely to quit if you go to many counseling sessions.  Using resources and support systems, such as: ? Online chats with a counselor. ? Phone quitlines. ? Printed self-help materials. ? Support groups or group counseling. ? Text messaging programs. ? Mobile phone apps or applications.  Taking medicines. Some of these medicines may have nicotine in them. If you are pregnant or breastfeeding, do not take any medicines to quit smoking unless your doctor says it is okay. Talk with your doctor about counseling or other things that can help you.  Talk with your doctor about using more than one strategy at the same time, such as taking medicines while you are also going to in-person counseling. This can help make  quitting easier. What things can I do to make it easier to quit? Quitting smoking might feel very hard at first, but there is a lot that you can do to make it easier. Take these steps:  Talk to your family and friends. Ask them to support and encourage you.  Call phone quitlines, reach out to support groups, or work with a counselor.  Ask people who smoke to not smoke around you.  Avoid places that make you want (trigger) to smoke, such as: ? Bars. ? Parties. ? Smoke-break areas at work.  Spend time with people who do not smoke.  Lower the stress in your life. Stress can make you want to smoke. Try these things to help your stress: ? Getting regular exercise. ? Deep-breathing exercises. ? Yoga. ? Meditating. ? Doing a body scan. To do this, close your eyes, focus on one area of your body at a time from head to toe, and notice which parts of your body are tense. Try to relax the muscles in those areas.  Download or buy apps on your mobile phone or tablet that can help you stick to your quit plan. There are many free apps, such as QuitGuide from the CDC (Centers for Disease Control and Prevention). You can find more support from smokefree.gov and other websites.  This information is not intended to replace advice given to you by your health care provider. Make sure you discuss any questions you have with your health care provider. Document Released: 05/06/2009 Document   Revised: 03/07/2016 Document Reviewed: 11/24/2014 Elsevier Interactive Patient Education  2018 Elsevier Inc.  

## 2017-08-09 ENCOUNTER — Telehealth: Payer: Self-pay | Admitting: Radiology

## 2017-08-09 DIAGNOSIS — Z981 Arthrodesis status: Secondary | ICD-10-CM

## 2017-08-09 NOTE — Telephone Encounter (Signed)
I have called patient, she  Needs to get records from Dr Franky Machoabbell to send to Dr Noel Geroldohen for appointment. I have been told I have the wrong number when I call.   I have sent her a  My chart message.

## 2017-08-09 NOTE — Telephone Encounter (Signed)
-----   Message from Vickki HearingStanley E Harrison, MD sent at 08/08/2017  5:25 PM EST ----- Referral to spine center Dr. Noel Geroldohen evaluation of lumbar spine previous fusion

## 2017-08-20 ENCOUNTER — Telehealth: Payer: Self-pay | Admitting: Radiology

## 2017-08-20 NOTE — Telephone Encounter (Signed)
Unable to reach patient about her request for 2nd opinion with Dr Noel Geroldohen someone answers the phone number given and tells me it is not Tina Pham. I have sent a my chart message which has not been read. I have now mailed her a letter.

## 2017-09-03 ENCOUNTER — Telehealth: Payer: Self-pay | Admitting: Radiology

## 2017-09-03 NOTE — Telephone Encounter (Signed)
Patient requested referral to Dr Noel Geroldohen for her back, she was previously treated by Dr Franky Machoabbell. I have called patient, sent my chart message, and mailed a letter to patient to send records from Dr Franky Machoabbell to Dr Noel Geroldohen, or here so we can send to Dr Noel Geroldohen. Patient has declined to reply to my communications. I can not proceed with the referral without records. I have cancelled the referral and it patient gets records later, can reopen.

## 2017-11-20 ENCOUNTER — Emergency Department (HOSPITAL_COMMUNITY)
Admission: EM | Admit: 2017-11-20 | Discharge: 2017-11-20 | Disposition: A | Discharge status: Home / Self Care | Payer: BLUE CROSS/BLUE SHIELD | Attending: Emergency Medicine | Admitting: Emergency Medicine

## 2017-11-20 ENCOUNTER — Other Ambulatory Visit: Payer: Self-pay

## 2017-11-20 DIAGNOSIS — I10 Essential (primary) hypertension: Secondary | ICD-10-CM | POA: Diagnosis not present

## 2017-11-20 DIAGNOSIS — Z79899 Other long term (current) drug therapy: Secondary | ICD-10-CM | POA: Diagnosis not present

## 2017-11-20 DIAGNOSIS — E119 Type 2 diabetes mellitus without complications: Secondary | ICD-10-CM | POA: Insufficient documentation

## 2017-11-20 DIAGNOSIS — R51 Headache: Secondary | ICD-10-CM | POA: Diagnosis present

## 2017-11-20 DIAGNOSIS — G43009 Migraine without aura, not intractable, without status migrainosus: Secondary | ICD-10-CM | POA: Diagnosis not present

## 2017-11-20 DIAGNOSIS — F17210 Nicotine dependence, cigarettes, uncomplicated: Secondary | ICD-10-CM | POA: Insufficient documentation

## 2017-11-20 LAB — POC URINE PREG, ED: Preg Test, Ur: NEGATIVE

## 2017-11-20 MED ORDER — KETOROLAC TROMETHAMINE 30 MG/ML IJ SOLN
30.0000 mg | Freq: Once | INTRAMUSCULAR | Status: AC
  Administered 2017-11-20: 30 mg via INTRAVENOUS
  Filled 2017-11-20: qty 1

## 2017-11-20 MED ORDER — DEXAMETHASONE SODIUM PHOSPHATE 10 MG/ML IJ SOLN
10.0000 mg | Freq: Once | INTRAMUSCULAR | Status: AC
  Administered 2017-11-20: 10 mg via INTRAVENOUS
  Filled 2017-11-20: qty 1

## 2017-11-20 MED ORDER — ONDANSETRON HCL 4 MG/2ML IJ SOLN
4.0000 mg | Freq: Once | INTRAMUSCULAR | Status: AC
  Administered 2017-11-20: 4 mg via INTRAVENOUS
  Filled 2017-11-20: qty 2

## 2017-11-20 MED ORDER — ISOMETHEPTENE-DICHLORAL-APAP 65-100-325 MG PO CAPS: 1.0000 | ORAL_CAPSULE | Freq: Four times a day (QID) | ORAL | 0 refills | Status: DC | PRN

## 2017-11-20 MED ORDER — METOCLOPRAMIDE HCL 5 MG/ML IJ SOLN
10.0000 mg | Freq: Once | INTRAMUSCULAR | Status: AC
  Administered 2017-11-20: 10 mg via INTRAVENOUS
  Filled 2017-11-20: qty 2

## 2017-11-20 MED ORDER — MORPHINE SULFATE (PF) 4 MG/ML IV SOLN
4.0000 mg | Freq: Once | INTRAVENOUS | Status: AC
  Administered 2017-11-20: 4 mg via INTRAVENOUS
  Filled 2017-11-20: qty 1

## 2017-11-20 MED ORDER — SODIUM CHLORIDE 0.9 % IV BOLUS
1000.0000 mL | Freq: Once | INTRAVENOUS | Status: AC
  Administered 2017-11-20: 1000 mL via INTRAVENOUS

## 2017-11-20 NOTE — ED Triage Notes (Signed)
Migraine x 4 day. OTC medications not working. Nausea, light sensitive, loss of appetite noted.

## 2017-11-20 NOTE — Discharge Instructions (Signed)
You may try the medicine prescribed for your next migraine headache since this medicine has been helpful in the past.  You have received a narcotic injection today which will make you drowsy so do not drive within the next 4 hours.

## 2017-11-20 NOTE — ED Provider Notes (Signed)
[facility] EMERGENCY DEPARTMENT Provider Note   CSN: [id number] Arrival date & time: 11/20/17  1556     History   Chief Complaint Chief Complaint  Patient presents with  . Migraine    HPI Tina Pham is a 42 y.o. female presenting with migraine headache which started 4 days ago.  The patient has a history of migraine, reporting very frequent headaches, but generally respond to ibuprofen which this headache has not.  Her current symptoms are similar to previous episodes of migraine headache.  The patients symptoms were preceded by prodromal symptoms.  The patient has left sided pain in association with nausea, insomnia, photophobia and reduced appetite, but denies emesis, dizziness, vomiting, abdominal pain and focal weakness.  She has had poor PO intake the past 2 days secondary to headache. There has been no fevers, chills, syncope, confusion or localized weakness.  The patient has tried ibuprofen without relief of symptoms.  .  The history is provided by the patient.    Past Medical History:  Diagnosis Date  . Anxiety   . Arthritis   . Chronic back pain   . Depression   . Diabetes mellitus    lost weight and is not on meds now.  . High cholesterol   . Hypertension   . Migraine   . Neuropathic pain of foot   . Plantar fasciitis   . Sciatica   . Torn rotator cuff     Patient Active Problem List   Diagnosis Date Noted  . Chronic pain of left knee   . Left shoulder pain 09/21/2015  . Disorders of bursae and tendons in shoulder region, unspecified 09/25/2013    Past Surgical History:  Procedure Laterality Date  . BACK SURGERY     herniated disc; had disc removed and then fusion; total of 3 surgeries  . EYE SURGERY Bilateral    removal of cyst and straightening of muscles  . KNEE ARTHROSCOPY WITH MEDIAL MENISECTOMY Left 12/27/2016   Procedure: LEFT KNEE DIAGNOSTIC ARTHROSCOPY;  Surgeon: Vickki Hearing, MD;  Location: [facility];  Service: Orthopedics;   Laterality: Left;  . TUBAL LIGATION       OB History    Gravida  3   Para  3   Term  3   Preterm      AB      Living  3     SAB      TAB      Ectopic      Multiple      Live Births               Home Medications    Prior to Admission medications   Medication Sig Start Date End Date Taking? Authorizing Provider  acetaminophen (TYLENOL) 325 MG tablet Take 650 mg by mouth every 6 (six) hours as needed.    [provider]  HYDROcodone-acetaminophen (NORCO) 5-325 MG tablet Take 1 tablet by mouth every 8 (eight) hours as needed for moderate pain. Patient not taking: Reported on 08/08/2017 01/08/17   Vickki Hearing, MD  ibuprofen (ADVIL,MOTRIN) 200 MG tablet Take 800 mg by mouth every 8 (eight) hours as needed (for pain.).    [provider]  isometheptene-acetaminophen-dichloralphenazone (MIDRIN) [phone] MG capsule Take 1 capsule by mouth 4 (four) times daily as needed for migraine. Maximum 5 capsules in 12 hours for migraine headaches, 8 capsules in 24 hours for tension headaches. 11/20/17   Burgess Amor, PA-C  naproxen sodium (ALEVE) 220  MG tablet Take 220 mg by mouth.    [provider]  predniSONE (DELTASONE) 10 MG tablet Take 4 tables day 1 3 tablets day 2 2 tablets day 3 1 tablet day 4 when knee is swollen 08/08/17   Vickki Hearing, MD    Family History Family History  Problem Relation Age of Onset  . Heart failure Mother   . COPD Mother   . Diabetes Mother   . Heart failure Father   . Diabetes Other     Social History Social History   Tobacco Use  . Smoking status: Current Every Day Smoker    Packs/day: 0.50    Years: 20.00    Pack years: 10.00    Types: Cigarettes  . Smokeless tobacco: Never Used  Substance Use Topics  . Alcohol use: No  . Drug use: No     Allergies   Codeine; Benadryl [diphenhydramine hcl]; and Compazine   Review of Systems Review of Systems  Constitutional: Positive for appetite  change. Negative for chills and fever.  HENT: Negative for congestion and sore throat.   Eyes: Negative.   Respiratory: Negative for chest tightness and shortness of breath.   Cardiovascular: Negative for chest pain.  Gastrointestinal: Positive for nausea. Negative for abdominal pain and vomiting.  Genitourinary: Negative.   Musculoskeletal: Negative for arthralgias, joint swelling and neck pain.  Skin: Negative.  Negative for rash and wound.  Neurological: Positive for headaches. Negative for dizziness, weakness, light-headedness and numbness.  Psychiatric/Behavioral: Positive for sleep disturbance.     Physical Exam Updated Vital Signs BP 125/82 (BP Location: Right Arm)   Pulse 95   Temp 98.4 F (36.9 C) (Oral)   Resp 18   Ht  (1.727 m)   Wt 113.4 kg (250 lb)   LMP 11/18/2017   SpO2 100%   BMI 38.01 kg/m   Physical Exam  Constitutional: She is oriented to person, place, and time. She appears well-developed and well-nourished.  HENT:  Head: Normocephalic and atraumatic.  Mouth/Throat: Oropharynx is clear and moist.  Eyes: Pupils are equal, round, and reactive to light. EOM are normal.  Neck: Normal range of motion. Neck supple.  Cardiovascular: Normal rate and normal heart sounds.  Pulmonary/Chest: Effort normal.  Abdominal: Soft. There is no tenderness.  Musculoskeletal: Normal range of motion.  Neurological: She is alert and oriented to person, place, and time. She has normal strength. No sensory deficit. Gait normal. GCS eye subscore is 4. GCS verbal subscore is 5. GCS motor subscore is 6.  Normal heel-shin, normal rapid alternating movements. Cranial nerves III-XII intact.  No pronator drift.  Skin: Skin is warm and dry. No rash noted.  Psychiatric: She has a normal mood and affect. Her speech is normal and behavior is normal. Thought content normal. Cognition and memory are normal.  Nursing note and vitals reviewed.    ED Treatments / Results  Labs (all  labs ordered are listed, but only abnormal results are displayed) Labs Reviewed  POC URINE PREG, ED    EKG None  Radiology No results found.  Procedures Procedures (including critical care time)  Medications Ordered in ED Medications  sodium chloride 0.9 % bolus 1,000 mL (1,000 mLs Intravenous New Bag/Given 11/20/17 1809)  ketorolac (TORADOL) 30 MG/ML injection 30 mg (30 mg Intravenous Given 11/20/17 1810)  metoCLOPramide (REGLAN) injection 10 mg (10 mg Intravenous Given 11/20/17 1810)  dexamethasone (DECADRON) injection 10 mg (10 mg Intravenous Given 11/20/17 1810)  morphine 4 MG/ML injection  4 mg (4 mg Intravenous Given 11/20/17 1853)  ondansetron (ZOFRAN) injection 4 mg (4 mg Intravenous Given 11/20/17 1853)     Initial Impression / Assessment and Plan / ED Course  I have reviewed the triage vital signs and the nursing notes.  Pertinent labs & imaging results that were available during my care of the patient were reviewed by me and considered in my medical decision making (see chart for details).     Pt given IV fluids along with reglan, decadron and toradol, with headache improving, but still 6/10. Added morphine with significant improvement.  She was ambulatory at dc without complaint.  She states she has been prescribed midrin in the past which has been helpful. This was prescribed today.  Also given referrals for pcp.   The patient appears reasonably screened and/or stabilized for discharge and I doubt any other medical condition or other [facility] requiring further screening, evaluation, or treatment in the ED at this time prior to discharge.   Final Clinical Impressions(s) / ED Diagnoses   Final diagnoses:  Migraine without aura and without status migrainosus, not intractable    ED Discharge Orders        Ordered    isometheptene-acetaminophen-dichloralphenazone (MIDRIN) 65-100-325 MG capsule  4 times daily PRN     11/20/17 1851       Burgess Amor, PA-C 11/20/17  1900    Eber Hong, MD 11/28/17 1012

## 2017-12-04 ENCOUNTER — Other Ambulatory Visit: Payer: Self-pay | Admitting: Obstetrics & Gynecology

## 2017-12-19 ENCOUNTER — Other Ambulatory Visit: Payer: Self-pay

## 2017-12-19 ENCOUNTER — Encounter (HOSPITAL_COMMUNITY): Payer: Self-pay | Admitting: Emergency Medicine

## 2017-12-19 ENCOUNTER — Emergency Department (HOSPITAL_COMMUNITY)
Admission: EM | Admit: 2017-12-19 | Discharge: 2017-12-19 | Disposition: A | Discharge status: Home / Self Care | Payer: BLUE CROSS/BLUE SHIELD | Attending: Emergency Medicine | Admitting: Emergency Medicine

## 2017-12-19 DIAGNOSIS — R51 Headache: Secondary | ICD-10-CM | POA: Diagnosis not present

## 2017-12-19 DIAGNOSIS — Z5321 Procedure and treatment not carried out due to patient leaving prior to being seen by health care provider: Secondary | ICD-10-CM | POA: Insufficient documentation

## 2017-12-19 DIAGNOSIS — H53149 Visual discomfort, unspecified: Secondary | ICD-10-CM | POA: Diagnosis not present

## 2017-12-19 DIAGNOSIS — R110 Nausea: Secondary | ICD-10-CM | POA: Diagnosis not present

## 2017-12-19 LAB — CBG MONITORING, ED: Glucose-Capillary: 261 mg/dL — ABNORMAL HIGH (ref 65–99)

## 2017-12-19 NOTE — ED Notes (Signed)
Called no answer x2 

## 2017-12-19 NOTE — ED Notes (Signed)
Called no answer

## 2017-12-19 NOTE — ED Triage Notes (Signed)
Pt c/o migraine all day today. Nausea. Light sensitivity. Nad.

## 2017-12-20 NOTE — ED Notes (Signed)
Follow up call complete  Pt states she is looking for a pcp  12/20/17  1005  s Treylan Mcclintock rn

## 2018-01-01 ENCOUNTER — Other Ambulatory Visit: Payer: Self-pay | Admitting: Obstetrics & Gynecology

## 2018-05-30 IMAGING — DX DG CHEST 2V
2 series · 2 of 2 positions shown · non-contrast
Comparison: New 07/22/2014

CLINICAL DATA: Nonproductive cough, headache, body aches

EXAM:
CHEST  2 VIEW

[chest pa]
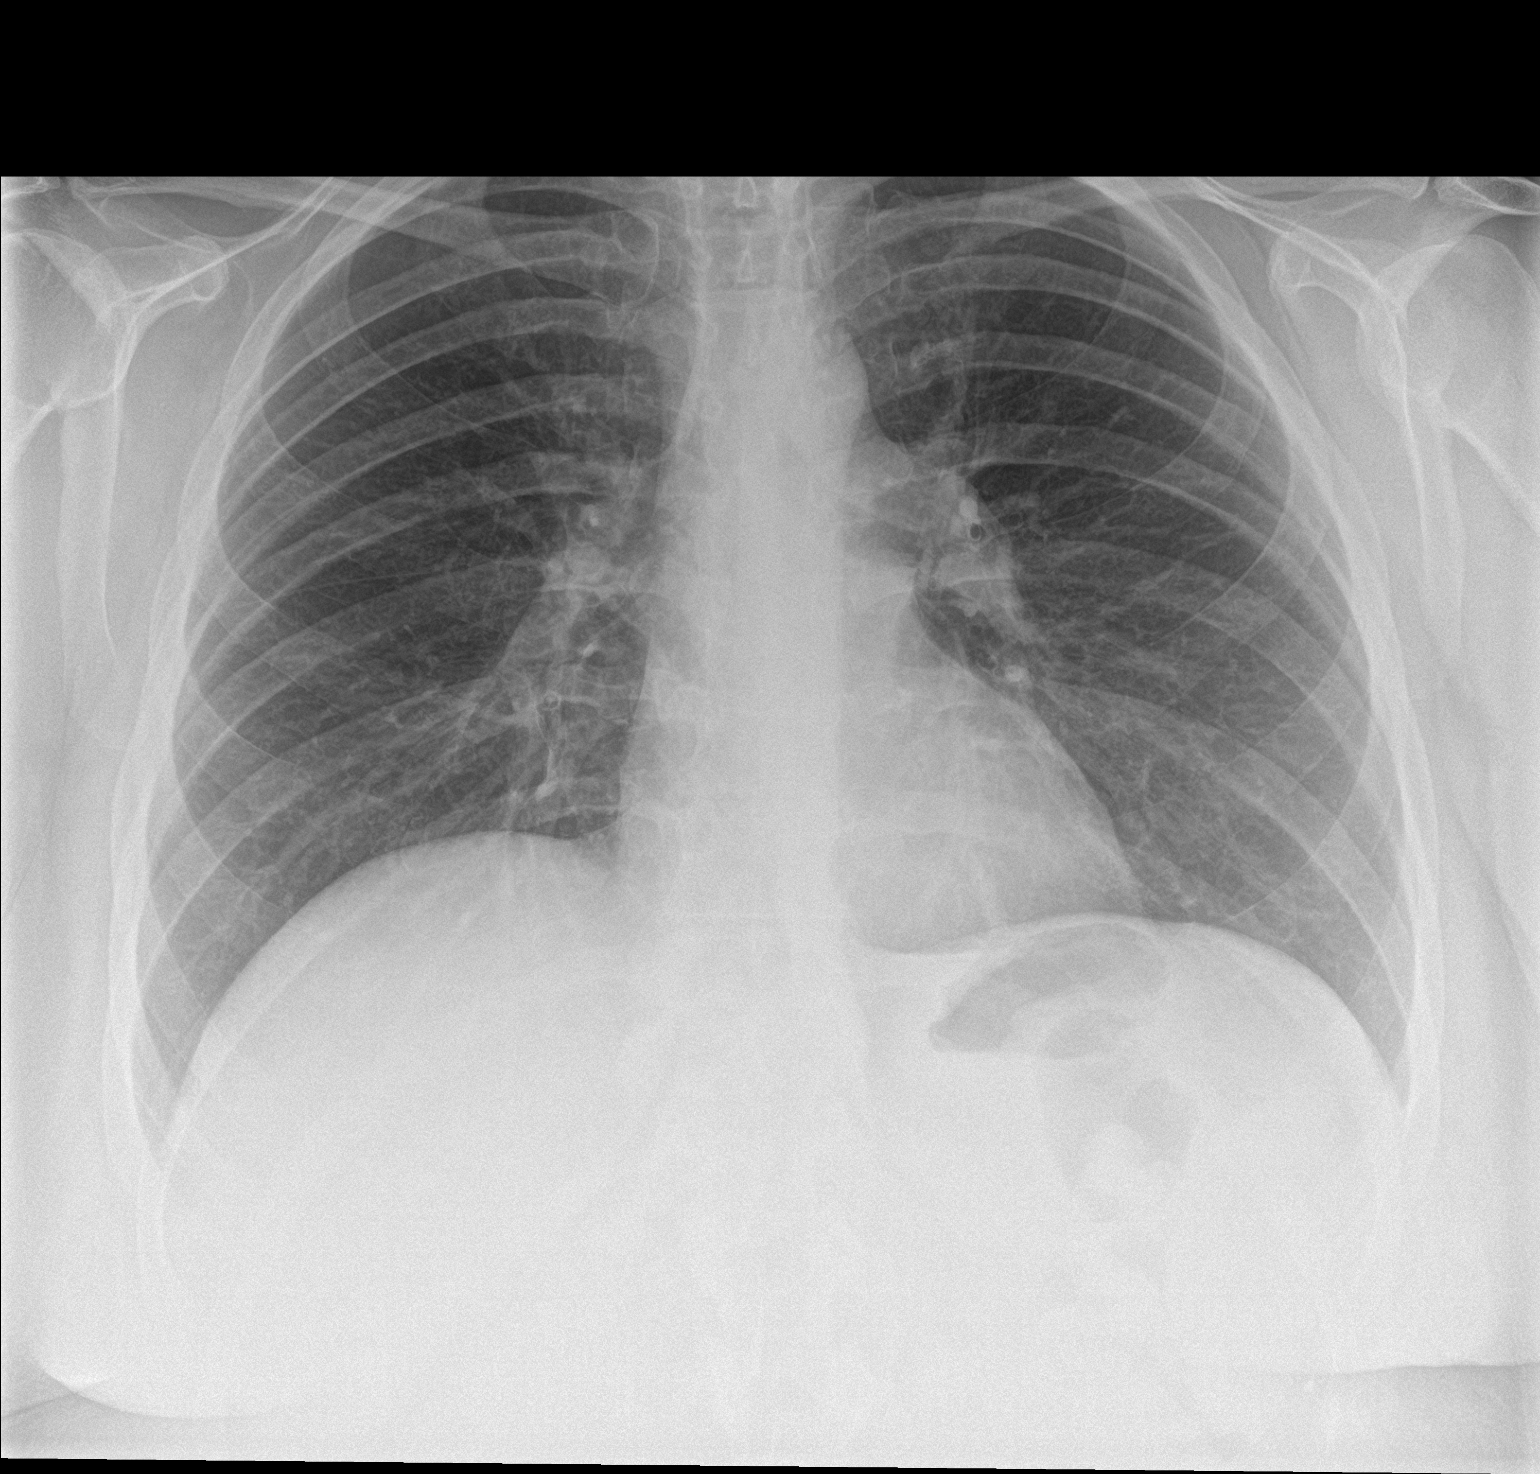

[chest lat]
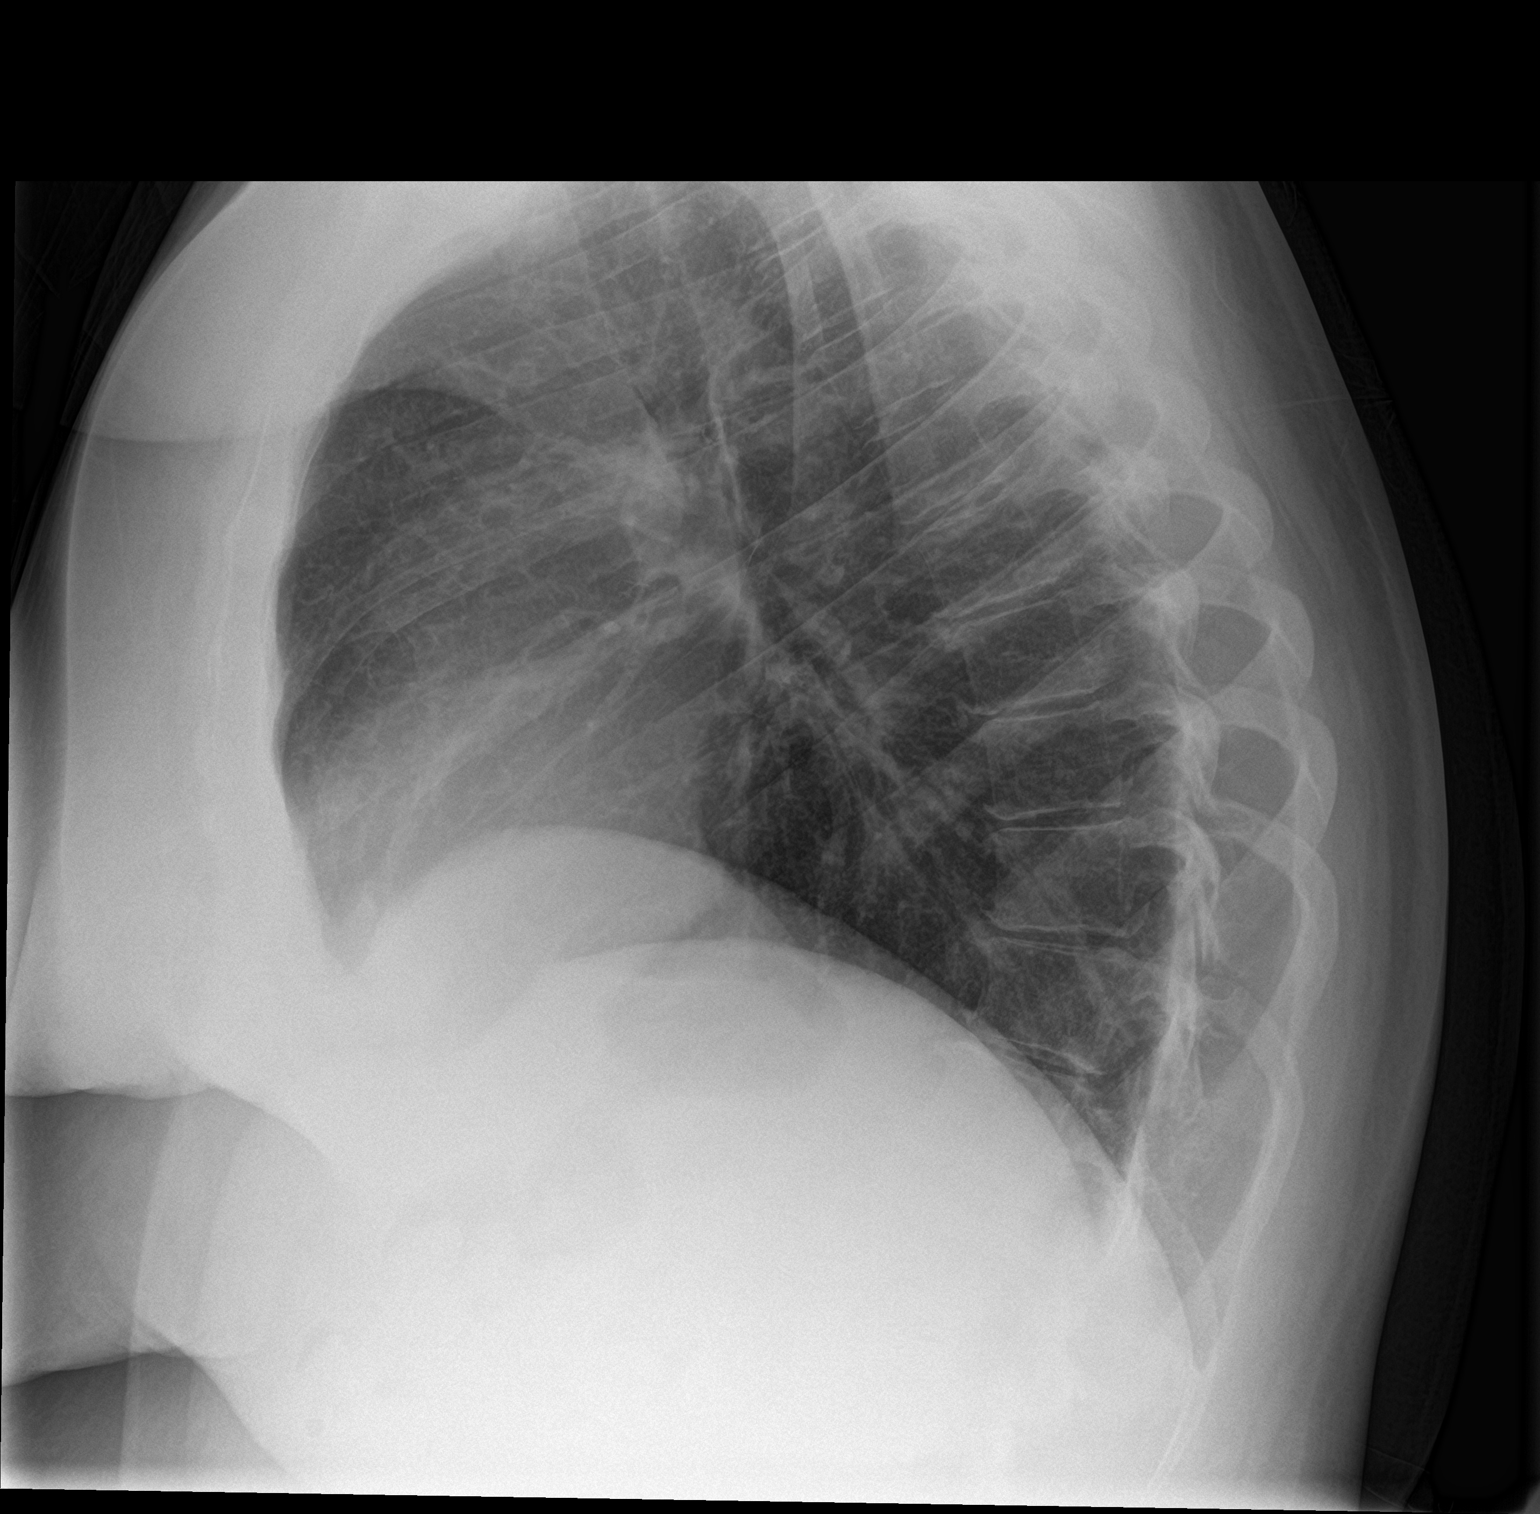

[2 of 2 positions shown; findings below may reference images not displayed]

FINDINGS: The heart size and mediastinal contours are within normal limits.
Both lungs are clear. The visualized skeletal structures are
unremarkable.
IMPRESSION: No active cardiopulmonary disease.

## 2018-06-06 ENCOUNTER — Emergency Department (HOSPITAL_COMMUNITY)
Admission: EM | Admit: 2018-06-06 | Discharge: 2018-06-06 | Disposition: A | Discharge status: Home / Self Care | Payer: BLUE CROSS/BLUE SHIELD | Attending: Emergency Medicine | Admitting: Emergency Medicine

## 2018-06-06 ENCOUNTER — Encounter (HOSPITAL_COMMUNITY): Payer: Self-pay | Admitting: Emergency Medicine

## 2018-06-06 ENCOUNTER — Other Ambulatory Visit: Payer: Self-pay

## 2018-06-06 ENCOUNTER — Emergency Department (HOSPITAL_COMMUNITY): Payer: BLUE CROSS/BLUE SHIELD

## 2018-06-06 DIAGNOSIS — J069 Acute upper respiratory infection, unspecified: Secondary | ICD-10-CM | POA: Diagnosis not present

## 2018-06-06 DIAGNOSIS — B9789 Other viral agents as the cause of diseases classified elsewhere: Secondary | ICD-10-CM | POA: Insufficient documentation

## 2018-06-06 DIAGNOSIS — R69 Illness, unspecified: Secondary | ICD-10-CM

## 2018-06-06 DIAGNOSIS — I10 Essential (primary) hypertension: Secondary | ICD-10-CM | POA: Insufficient documentation

## 2018-06-06 DIAGNOSIS — R05 Cough: Secondary | ICD-10-CM | POA: Diagnosis present

## 2018-06-06 DIAGNOSIS — E119 Type 2 diabetes mellitus without complications: Secondary | ICD-10-CM | POA: Insufficient documentation

## 2018-06-06 DIAGNOSIS — Z720 Tobacco use: Secondary | ICD-10-CM

## 2018-06-06 DIAGNOSIS — Z79899 Other long term (current) drug therapy: Secondary | ICD-10-CM | POA: Insufficient documentation

## 2018-06-06 DIAGNOSIS — J111 Influenza due to unidentified influenza virus with other respiratory manifestations: Secondary | ICD-10-CM

## 2018-06-06 DIAGNOSIS — F17210 Nicotine dependence, cigarettes, uncomplicated: Secondary | ICD-10-CM | POA: Insufficient documentation

## 2018-06-06 MED ORDER — OSELTAMIVIR PHOSPHATE 75 MG PO CAPS: 75.0000 mg | ORAL_CAPSULE | Freq: Two times a day (BID) | ORAL | 0 refills | Status: DC

## 2018-06-06 MED ORDER — PREDNISONE 10 MG PO TABS: 40.0000 mg | ORAL_TABLET | Freq: Every day | ORAL | 0 refills | Status: AC

## 2018-06-06 MED ORDER — ALBUTEROL SULFATE HFA 108 (90 BASE) MCG/ACT IN AERS: 2.0000 | INHALATION_SPRAY | Freq: Four times a day (QID) | RESPIRATORY_TRACT | 0 refills | Status: DC | PRN

## 2018-06-06 NOTE — Discharge Instructions (Addendum)
You are seen in the ER for cough, headache, nasal congestion rhinorrhea, body aches.  Suspect her symptoms are from a virus, possibly influenza.  Guidelines recommend patients with diabetes, underlying lung disease for smoking initiate Tamiflu within 48 to 72 hours of flulike symptoms.  This medication will support your immune system.  Your symptoms will likely last 3 to 5 days and start to improve after a week.  The main treatment approach for a viral upper respiratory infection is to treat the symptoms, support your immune system and prevent spread of illness.   Stay well-hydrated. Rest. You can use over the counter medications to help with symptoms: 600 mg ibuprofen (motrin, aleve, advil) or [phone] mg acetaminophen (tylenol) every 6 hours, around the clock to help with associated fevers, sore throat, headaches, generalized body aches and malaise.  Oxymetazoline (afrin) intranasal spray once daily for no more than 3 days to help with congestion, after 3 days you can switch to another over-the-counter nasal steroid spray such as fluticasone (flonase) Allergy medication (loratadine, cetirizine, etc) and phenylephrine (sudafed) help with nasal congestion, runny nose and postnasal drip.   Dextromethorphan (Delsym) to suppress cough without mucus Guaifenesin (mucinex) to help with built up mucus in chest and productive cough Wash your hands often to prevent spread.   Use albuterol inhaler and prednisone to help with associated bronchospasm, cough and inflammatory response in your airways   Return to the ER for fevers, worsening symptoms, chest pain or shortness of breath with exertion

## 2018-06-06 NOTE — ED Triage Notes (Signed)
Pt c/o cough, nasal congestion, and body aches x 2-3 days. Denies fever.

## 2018-06-06 NOTE — ED Notes (Signed)
Patient given discharge instruction, verbalized understand. Patient ambulatory out of the department.  

## 2018-06-06 NOTE — ED Provider Notes (Signed)
[facility] EMERGENCY DEPARTMENT Provider Note   CSN: [id number] Arrival date & time: 06/06/18  1520     History   Chief Complaint Chief Complaint  Patient presents with  . Cough    HPI Tina Pham is a 42 y.o. female with a history of tobacco use, bronchitis, diabetes managed with diet and exercise is here for evaluation of cough.  Onset 2 days ago, dry, frequent and persistent.  Associated with nasal congestion, postnasal drip, frontal headache, chest tightness with exertion.  She lost her voice today.  Has been alternating ibuprofen and Tylenol with Robitussin without significant pain.  States her chest feels "tight" when she is exerting herself but denies any pain or shortness of breath with exertion.  No fevers, chills, sore throat, nausea, vomiting, abdominal pain, diarrhea.  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  States all the patients have gotten their flu shot but she has not this year.  States every year she typically gets bronchitis.  HPI  Past Medical History:  Diagnosis Date  . Anxiety   . Arthritis   . Chronic back pain   . Depression   . Diabetes mellitus    lost weight and is not on meds now.  . High cholesterol   . Hypertension   . Migraine   . Neuropathic pain of foot   . Plantar fasciitis   . Sciatica   . Torn rotator cuff     Patient Active Problem List   Diagnosis Date Noted  . Chronic pain of left knee   . Left shoulder pain 09/21/2015  . Disorders of bursae and tendons in shoulder region, unspecified 09/25/2013    Past Surgical History:  Procedure Laterality Date  . BACK SURGERY     herniated disc; had disc removed and then fusion; total of 3 surgeries  . EYE SURGERY Bilateral    removal of cyst and straightening of muscles  . KNEE ARTHROSCOPY WITH MEDIAL MENISECTOMY Left 12/27/2016   Procedure: LEFT KNEE DIAGNOSTIC ARTHROSCOPY;  Surgeon: Vickki Hearing, MD;  Location: [facility];  Service: Orthopedics;  Laterality: Left;  . TUBAL LIGATION       OB History    Gravida  3   Para  3   Term  3   Preterm      AB      Living  3     SAB      TAB      Ectopic      Multiple      Live Births               Home Medications    Prior to Admission medications   Medication Sig Start Date End Date Taking? Authorizing Provider  acetaminophen (TYLENOL) 325 MG tablet Take 650 mg by mouth every 6 (six) hours as needed.    [provider]  albuterol (VENTOLIN HFA) 108 (90 Base) MCG/ACT inhaler Inhale 2 puffs into the lungs every 6 (six) hours as needed for up to 7 days for wheezing (coughing). 06/06/18 06/13/18  Liberty Handy, PA-C  HYDROcodone-acetaminophen (NORCO) 5-325 MG tablet Take 1 tablet by mouth every 8 (eight) hours as needed for moderate pain. Patient not taking: Reported on 08/08/2017 01/08/17   Vickki Hearing, MD  ibuprofen (ADVIL,MOTRIN) 200 MG tablet Take 800 mg by mouth every 8 (eight) hours as needed (for pain.).    [provider]  isometheptene-acetaminophen-dichloralphenazone (MIDRIN) 65-100-325 MG capsule Take 1 capsule by mouth 4 (four) times daily as needed for migraine. Maximum 5 capsules in 12 hours for migraine headaches, 8 capsules in 24 hours for tension headaches. 11/20/17   Burgess AmorIdol, Julie, PA-C  naproxen sodium (ALEVE) 220 MG tablet Take 220 mg by mouth.    [provider]  oseltamivir (TAMIFLU) 75 MG capsule Take 1 capsule (75 mg total) by mouth every 12 (twelve) hours. 06/06/18   Liberty HandyGibbons, Leticia Coletta J, PA-C  predniSONE (DELTASONE) 10 MG tablet Take 4 tablets (40 mg total) by mouth daily for 5 days. 06/06/18 06/11/18  Liberty HandyGibbons, Rayanna Matusik J, PA-C    Family History Family History  Problem Relation Age of Onset  . Heart failure Mother   . COPD Mother   . Diabetes Mother   . Heart failure Father   . Diabetes Other     Social History Social History   Tobacco Use  .  Smoking status: Current Every Day Smoker    Packs/day: 0.50    Years: 20.00    Pack years: 10.00    Types: Cigarettes  . Smokeless tobacco: Never Used  Substance Use Topics  . Alcohol use: No  . Drug use: No     Allergies   Codeine; Benadryl [diphenhydramine hcl]; and Compazine   Review of Systems Review of Systems  HENT: Positive for congestion, rhinorrhea and voice change.   Respiratory: Positive for cough and chest tightness.   Neurological: Positive for headaches.  All other systems reviewed and are negative.    Physical Exam Updated Vital Signs BP 134/75 (BP Location: Right Arm)   Pulse 95   Temp 98.6 F (37 C) (Oral)   Resp 16   Ht 5\' 8"  (1.727 m)   Wt 113.4 kg   LMP 05/20/2018 (Approximate)   SpO2 100%   BMI 38.01 kg/m   Physical Exam  Constitutional: She is oriented to person, place, and time. She appears well-developed and well-nourished. No distress.  Raspy, low voice.  Nontoxic.  HENT:  Head: Normocephalic and atraumatic.  Right Ear: External ear normal.  Left Ear: External ear normal.  Nose: Nose normal.  Moderate mucosal edema bilaterally with erythema and rhinorrhea.  Diffuse erythema to soft palate.  Oropharynx without erythema, edema, petechia.  Tonsils without edema, exudates.  Moist mucous members.  No sublingual fullness.  Normal protrusion of the tongue.  No pooling of oral secretions.  No trismus.  No sinus tenderness.  Eyes: Conjunctivae and EOM are normal. No scleral icterus.  Neck: Normal range of motion. Neck supple.  No cervical lymphadenopathy.  Cardiovascular: Normal rate, regular rhythm and normal heart sounds.  No murmur heard. Pulmonary/Chest: Effort normal and breath sounds normal. She has no wheezes.  Musculoskeletal: Normal range of motion. She exhibits no deformity.  Neurological: She is alert and oriented to person, place, and time.  Skin: Skin is warm and dry. Capillary refill takes less than 2 seconds.  Psychiatric: She  has a normal mood and affect. Her behavior is normal. Judgment and thought content normal.  Nursing note and vitals reviewed.    ED Treatments / Results  Labs (all labs ordered are listed, but only abnormal results are displayed) Labs Reviewed - No data to display  EKG None  Radiology Dg Chest 2 View  Result Date: 06/06/2018 CLINICAL DATA:  Cough, nasal congestion, aches over the last 2-3 days EXAM: CHEST - 2 VIEW COMPARISON:  Chest x-ray of 06/12/2016 FINDINGS: No active infiltrate  or effusion is seen. Mediastinal and hilar contours are unremarkable. The heart is within normal limits in size. No acute bony abnormality is seen. IMPRESSION: No active cardiopulmonary disease. Electronically Signed   By: Dwyane Dee M.D.   On: 06/06/2018 15:53    Procedures Procedures (including critical care time)  Medications Ordered in ED Medications - No data to display   Initial Impression / Assessment and Plan / ED Course  I have reviewed the triage vital signs and the nursing notes.  Pertinent labs & imaging results that were available during my care of the patient were reviewed by me and considered in my medical decision making (see chart for details).     42 y.o. -year-old female URI like symptoms x 2 days. On my exam patient is nontoxic appearing, speaking in full sentences, w/o increased WOB. No fever, tachypnea, tachycardia, hypoxia. Lungs are CTAB. CXR negative.  No indication for emergent labs in ER.  Given reassuring physical exam, will discharge with symptomatic treatment. Given underlying DM, tobacco use, bronchitis, recommended tamiflu as well as prednisone and albuterol inhaler.  Strict ED return precautions given. Patient is aware that a viral URI infection may precede pneumonia or worsening illness. Patient is aware of red flag symptoms to monitor for that would warrant return to the ED for further reevaluation.   Final Clinical Impressions(s) / ED Diagnoses   Final diagnoses:    Viral URI with cough  Influenza-like illness  Tobacco use    ED Discharge Orders         Ordered    predniSONE (DELTASONE) 10 MG tablet  Daily     06/06/18 1622    albuterol (VENTOLIN HFA) 108 (90 Base) MCG/ACT inhaler  Every 6 hours PRN     06/06/18 1622    oseltamivir (TAMIFLU) 75 MG capsule  Every 12 hours     06/06/18 1622           Liberty Handy, PA-C 06/06/18 1735    Sabas Sous, MD 06/07/18 [phone]

## 2018-08-06 ENCOUNTER — Encounter: Payer: Self-pay | Admitting: Orthopaedic Surgery

## 2018-08-06 ENCOUNTER — Ambulatory Visit: Payer: BLUE CROSS/BLUE SHIELD | Admitting: Orthopaedic Surgery

## 2018-08-06 VITALS — BP 134/74 | HR 95 | Ht 68.0 in | Wt 240.0 lb

## 2018-08-06 DIAGNOSIS — M25511 Pain in right shoulder: Secondary | ICD-10-CM

## 2018-08-06 DIAGNOSIS — F17210 Nicotine dependence, cigarettes, uncomplicated: Secondary | ICD-10-CM | POA: Diagnosis not present

## 2018-08-06 DIAGNOSIS — G8929 Other chronic pain: Secondary | ICD-10-CM

## 2018-08-06 MED ORDER — HYDROCODONE-ACETAMINOPHEN 5-325 MG PO TABS: ORAL_TABLET | ORAL | 0 refills | Status: DC

## 2018-08-06 NOTE — Progress Notes (Signed)
PROCEDURE NOTE:  The patient request injection, verbal consent was obtained.  The right shoulder was prepped appropriately after time out was performed.   Sterile technique was observed and injection of 1 cc of Depo-Medrol 40 mg with several cc's of plain xylocaine. Anesthesia was provided by ethyl chloride and a 20-gauge needle was used to inject the shoulder area. A posterior approach was used.  The injection was tolerated well.  A band aid dressing was applied.  The patient was advised to apply ice later today and tomorrow to the injection sight as needed.  I have reviewed the [place] Controlled Substance Reporting System web site prior to prescribing narcotic medicine for this patient.   Return in two weeks.  She may need MRI of the shoulder.  Electronically Signed Darreld Mclean, MD 1/14/20202:42 PM

## 2018-08-06 NOTE — Patient Instructions (Signed)
Steps to Quit Smoking    Smoking tobacco can be bad for your health. It can also affect almost every organ in your body. Smoking puts you and people around you at risk for many serious long-lasting (chronic) diseases. Quitting smoking is hard, but it is one of the best things that you can do for your health. It is never too late to quit.  What are the benefits of quitting smoking?  When you quit smoking, you lower your risk for getting serious diseases and conditions. They can include:  · Lung cancer or lung disease.  · Heart disease.  · Stroke.  · Heart attack.  · Not being able to have children (infertility).  · Weak bones (osteoporosis) and broken bones (fractures).  If you have coughing, wheezing, and shortness of breath, those symptoms may get better when you quit. You may also get sick less often. If you are pregnant, quitting smoking can help to lower your chances of having a baby of low birth weight.  What can I do to help me quit smoking?  Talk with your doctor about what can help you quit smoking. Some things you can do (strategies) include:  · Quitting smoking totally, instead of slowly cutting back how much you smoke over a period of time.  · Going to in-person counseling. You are more likely to quit if you go to many counseling sessions.  · Using resources and support systems, such as:  ? Online chats with a counselor.  ? Phone quitlines.  ? Printed self-help materials.  ? Support groups or group counseling.  ? Text messaging programs.  ? Mobile phone apps or applications.  · Taking medicines. Some of these medicines may have nicotine in them. If you are pregnant or breastfeeding, do not take any medicines to quit smoking unless your doctor says it is okay. Talk with your doctor about counseling or other things that can help you.  Talk with your doctor about using more than one strategy at the same time, such as taking medicines while you are also going to in-person counseling. This can help make  quitting easier.  What things can I do to make it easier to quit?  Quitting smoking might feel very hard at first, but there is a lot that you can do to make it easier. Take these steps:  · Talk to your family and friends. Ask them to support and encourage you.  · Call phone quitlines, reach out to support groups, or work with a counselor.  · Ask people who smoke to not smoke around you.  · Avoid places that make you want (trigger) to smoke, such as:  ? Bars.  ? Parties.  ? Smoke-break areas at work.  · Spend time with people who do not smoke.  · Lower the stress in your life. Stress can make you want to smoke. Try these things to help your stress:  ? Getting regular exercise.  ? Deep-breathing exercises.  ? Yoga.  ? Meditating.  ? Doing a body scan. To do this, close your eyes, focus on one area of your body at a time from head to toe, and notice which parts of your body are tense. Try to relax the muscles in those areas.  · Download or buy apps on your mobile phone or tablet that can help you stick to your quit plan. There are many free apps, such as QuitGuide from the CDC (Centers for Disease Control and Prevention). You can find more   support from smokefree.gov and other websites.  This information is not intended to replace advice given to you by your health care provider. Make sure you discuss any questions you have with your health care provider.  Document Released: 05/06/2009 Document Revised: 03/07/2016 Document Reviewed: 11/24/2014  Elsevier Interactive Patient Education © 2019 Elsevier Inc.

## 2018-08-20 ENCOUNTER — Ambulatory Visit: Payer: BLUE CROSS/BLUE SHIELD | Admitting: Orthopaedic Surgery

## 2018-08-20 ENCOUNTER — Encounter: Payer: Self-pay | Admitting: Orthopaedic Surgery

## 2018-08-20 VITALS — BP 160/94 | HR 93 | Ht 68.0 in | Wt 240.0 lb

## 2018-08-20 DIAGNOSIS — G8929 Other chronic pain: Secondary | ICD-10-CM

## 2018-08-20 DIAGNOSIS — M25511 Pain in right shoulder: Secondary | ICD-10-CM | POA: Diagnosis not present

## 2018-08-20 DIAGNOSIS — F17210 Nicotine dependence, cigarettes, uncomplicated: Secondary | ICD-10-CM | POA: Diagnosis not present

## 2018-08-20 MED ORDER — HYDROCODONE-ACETAMINOPHEN 5-325 MG PO TABS: ORAL_TABLET | ORAL | 0 refills | Status: DC

## 2018-08-21 NOTE — Progress Notes (Signed)
Patient RF:XJOIT Tina Pham, female DOB:Nov 27, 1975, 43 y.o. GPQ:[id number]  Chief Complaint  Patient presents with  . Shoulder Pain    right   . Neck Pain    right arm pain, into fingers right hand     HPI  Tina Pham is a 43 y.o. female who has worsening of pain in the right shoulder.  She has not improved with treatment to date.  I will get a MRI of the shoulder.   Body mass index is 36.49 kg/m.  ROS  Review of Systems  Constitutional:       Patient has Diabetes Mellitus. Patient does not have hypertension. Patient does not have COPD or shortness of breath. Patient has BMI > 35. Patient has current smoking history.  HENT: Negative for congestion.   Respiratory: Negative for cough and shortness of breath.   Cardiovascular: Negative for chest pain.  Endocrine: Negative for cold intolerance.  Musculoskeletal: Positive for arthralgias, back pain and myalgias.  Allergic/Immunologic: Negative for environmental allergies.  Psychiatric/Behavioral: The patient is nervous/anxious.     All other systems reviewed and are negative.  The following is a summary of the past history medically, past history surgically, known current medicines, social history and family history.  This information is gathered electronically by the computer from prior information and documentation.  I review this each visit and have found including this information at this point in the chart is beneficial and informative.    Past Medical History:  Diagnosis Date  . Anxiety   . Arthritis   . Chronic back pain   . Depression   . Diabetes mellitus    lost weight and is not on meds now.  . High cholesterol   . Hypertension   . Migraine   . Neuropathic pain of foot   . Plantar fasciitis   . Sciatica   . Torn rotator cuff     Past Surgical History:  Procedure Laterality Date  . BACK SURGERY     herniated disc; had disc removed and then fusion; total of 3 surgeries  . EYE SURGERY  Bilateral    removal of cyst and straightening of muscles  . KNEE ARTHROSCOPY WITH MEDIAL MENISECTOMY Left 12/27/2016   Procedure: LEFT KNEE DIAGNOSTIC ARTHROSCOPY;  Surgeon: Vickki Hearing, MD;  Location: [facility];  Service: Orthopedics;  Laterality: Left;  . TUBAL LIGATION      Family History  Problem Relation Age of Onset  . Heart failure Mother   . COPD Mother   . Diabetes Mother   . Heart failure Father   . Diabetes Other     Social History Social History   Tobacco Use  . Smoking status: Current Every Day Smoker    Packs/day: 0.50    Years: 20.00    Pack years: 10.00    Types: Cigarettes  . Smokeless tobacco: Never Used  Substance Use Topics  . Alcohol use: No  . Drug use: No    Allergies  Allergen Reactions  . Codeine Other (See Comments)    Upset Stomach  . Benadryl [Diphenhydramine Hcl] Palpitations  . Compazine Palpitations    Current Outpatient Medications  Medication Sig Dispense Refill  . acetaminophen (TYLENOL) 325 MG tablet Take 650 mg by mouth every 6 (six) hours as needed.    Marland Kitchen HYDROcodone-acetaminophen (NORCO/VICODIN) 5-325 MG tablet One tablet every four hours for pain. 30 tablet 0  . ibuprofen (ADVIL,MOTRIN) 200 MG tablet Take 800 mg by mouth every 8 (eight) hours as  needed (for pain.).    Marland Kitchen naproxen sodium (ALEVE) 220 MG tablet Take 220 mg by mouth.    Marland Kitchen albuterol (VENTOLIN HFA) 108 (90 Base) MCG/ACT inhaler Inhale 2 puffs into the lungs every 6 (six) hours as needed for up to 7 days for wheezing (coughing). (Patient not taking: Reported on 08/06/2018) 1 Inhaler 0  . isometheptene-acetaminophen-dichloralphenazone (MIDRIN) 65-100-325 MG capsule Take 1 capsule by mouth 4 (four) times daily as needed for migraine. Maximum 5 capsules in 12 hours for migraine headaches, 8 capsules in 24 hours for tension headaches. (Patient not taking: Reported on 08/06/2018) 30 capsule 0   No current facility-administered medications for this visit.      Physical  Exam  Blood pressure (!) 160/94, pulse 93, height 5\' 8"  (1.727 m), weight 240 lb (108.9 kg).  Constitutional: overall normal hygiene, normal nutrition, well developed, normal grooming, normal body habitus. Assistive device:none  Musculoskeletal: gait and station Limp none, muscle tone and strength are normal, no tremors or atrophy is present.  .  Neurological: coordination overall normal.  Deep tendon reflex/nerve stretch intact.  Sensation normal.  Cranial nerves II-XII intact.   Skin:   Normal overall no scars, lesions, ulcers or rashes. No psoriasis.  Psychiatric: Alert and oriented x 3.  Recent memory intact, remote memory unclear.  Normal mood and affect. Well groomed.  Good eye contact.  Cardiovascular: overall no swelling, no varicosities, no edema bilaterally, normal temperatures of the legs and arms, no clubbing, cyanosis and good capillary refill.  Lymphatic: palpation is normal.  Right shoulder motion is painful above the head.  Forward 150, abduction 120, internal 30, external 30, extension 15, adduction full.  NV intact. All other systems reviewed and are negative   The patient has been educated about the nature of the problem(s) and counseled on treatment options.  The patient appeared to understand what I have discussed and is in agreement with it.  Encounter Diagnoses  Name Primary?  . Chronic right shoulder pain Yes  . Cigarette nicotine dependence without complication     PLAN Call if any problems.  Precautions discussed.  Continue current medications.   Return to clinic after MRI of the shoulder on the right  I have reviewed the [facility] Controlled Substance Reporting System web site prior to prescribing narcotic medicine for this patient.    Electronically Signed Darreld Mclean, MD 1/29/20208:08 AM

## 2018-08-23 ENCOUNTER — Telehealth: Payer: Self-pay | Admitting: Orthopaedic Surgery

## 2018-08-23 NOTE — Telephone Encounter (Signed)
Patient was checking to see if you had heard anything back from her insurance about an approval for the MRI. I told her that I saw where you had submitted the request and waiting on response. I also told her that when you get the approval you will let her know.

## 2018-08-26 NOTE — Telephone Encounter (Signed)
Received authorization and patient notified of appointments.

## 2018-09-02 ENCOUNTER — Ambulatory Visit (HOSPITAL_COMMUNITY)
Admission: RE | Admit: 2018-09-02 | Discharge: 2018-09-02 | Disposition: A | Discharge status: Home / Self Care | Payer: BLUE CROSS/BLUE SHIELD | Source: Ambulatory Visit | Attending: Orthopaedic Surgery | Admitting: Orthopaedic Surgery

## 2018-09-02 DIAGNOSIS — M25511 Pain in right shoulder: Secondary | ICD-10-CM | POA: Diagnosis not present

## 2018-09-02 DIAGNOSIS — G8929 Other chronic pain: Secondary | ICD-10-CM | POA: Diagnosis present

## 2018-09-03 ENCOUNTER — Encounter: Payer: Self-pay | Admitting: Orthopaedic Surgery

## 2018-09-03 ENCOUNTER — Ambulatory Visit: Payer: BLUE CROSS/BLUE SHIELD | Admitting: Orthopaedic Surgery

## 2018-09-03 VITALS — BP 157/90 | HR 110 | Ht 68.0 in | Wt 240.0 lb

## 2018-09-03 DIAGNOSIS — M25511 Pain in right shoulder: Secondary | ICD-10-CM

## 2018-09-03 DIAGNOSIS — G8929 Other chronic pain: Secondary | ICD-10-CM | POA: Diagnosis not present

## 2018-09-03 DIAGNOSIS — F17210 Nicotine dependence, cigarettes, uncomplicated: Secondary | ICD-10-CM | POA: Diagnosis not present

## 2018-09-03 MED ORDER — HYDROCODONE-ACETAMINOPHEN 5-325 MG PO TABS: ORAL_TABLET | ORAL | 0 refills | Status: DC

## 2018-09-03 NOTE — Patient Instructions (Signed)
Steps to Quit Smoking    Smoking tobacco can be bad for your health. It can also affect almost every organ in your body. Smoking puts you and people around you at risk for many serious long-lasting (chronic) diseases. Quitting smoking is hard, but it is one of the best things that you can do for your health. It is never too late to quit.  What are the benefits of quitting smoking?  When you quit smoking, you lower your risk for getting serious diseases and conditions. They can include:  · Lung cancer or lung disease.  · Heart disease.  · Stroke.  · Heart attack.  · Not being able to have children (infertility).  · Weak bones (osteoporosis) and broken bones (fractures).  If you have coughing, wheezing, and shortness of breath, those symptoms may get better when you quit. You may also get sick less often. If you are pregnant, quitting smoking can help to lower your chances of having a baby of low birth weight.  What can I do to help me quit smoking?  Talk with your doctor about what can help you quit smoking. Some things you can do (strategies) include:  · Quitting smoking totally, instead of slowly cutting back how much you smoke over a period of time.  · Going to in-person counseling. You are more likely to quit if you go to many counseling sessions.  · Using resources and support systems, such as:  ? Online chats with a counselor.  ? Phone quitlines.  ? Printed self-help materials.  ? Support groups or group counseling.  ? Text messaging programs.  ? Mobile phone apps or applications.  · Taking medicines. Some of these medicines may have nicotine in them. If you are pregnant or breastfeeding, do not take any medicines to quit smoking unless your doctor says it is okay. Talk with your doctor about counseling or other things that can help you.  Talk with your doctor about using more than one strategy at the same time, such as taking medicines while you are also going to in-person counseling. This can help make  quitting easier.  What things can I do to make it easier to quit?  Quitting smoking might feel very hard at first, but there is a lot that you can do to make it easier. Take these steps:  · Talk to your family and friends. Ask them to support and encourage you.  · Call phone quitlines, reach out to support groups, or work with a counselor.  · Ask people who smoke to not smoke around you.  · Avoid places that make you want (trigger) to smoke, such as:  ? Bars.  ? Parties.  ? Smoke-break areas at work.  · Spend time with people who do not smoke.  · Lower the stress in your life. Stress can make you want to smoke. Try these things to help your stress:  ? Getting regular exercise.  ? Deep-breathing exercises.  ? Yoga.  ? Meditating.  ? Doing a body scan. To do this, close your eyes, focus on one area of your body at a time from head to toe, and notice which parts of your body are tense. Try to relax the muscles in those areas.  · Download or buy apps on your mobile phone or tablet that can help you stick to your quit plan. There are many free apps, such as QuitGuide from the CDC (Centers for Disease Control and Prevention). You can find more   support from smokefree.gov and other websites.  This information is not intended to replace advice given to you by your health care provider. Make sure you discuss any questions you have with your health care provider.  Document Released: 05/06/2009 Document Revised: 03/07/2016 Document Reviewed: 11/24/2014  Elsevier Interactive Patient Education © 2019 Elsevier Inc.

## 2018-09-03 NOTE — Progress Notes (Signed)
Tina Pham, female DOB:February 13, 1976, 43 y.o. EML:[id number]  Chief Complaint  Tina presents with  . Shoulder Pain    right  . Results    MRI review Right shoulder     HPI  Tina Pham is a 43 y.o. female who has continued pain of the right shoulder. She had a MRI which showed: IMPRESSION: Focal small deep partial-thickness articular surface tear of the distal supraspinatus tendon.  I have explained the findings to her. She has tried PT, injections, medicine and still has pain. She would like to have possible surgery.  I will have Dr. Romeo Apple see her.  She is agreeable to this.   Body mass index is 36.49 kg/m.  ROS  Review of Systems  Constitutional:       Tina has Diabetes Mellitus. Tina does not have hypertension. Tina does not have COPD or shortness of breath. Tina has BMI > 35. Tina has current smoking history.  HENT: Negative for congestion.   Respiratory: Negative for cough and shortness of breath.   Cardiovascular: Negative for chest pain.  Endocrine: Negative for cold intolerance.  Musculoskeletal: Positive for arthralgias, back pain and myalgias.  Allergic/Immunologic: Negative for environmental allergies.  Psychiatric/Behavioral: The Tina is nervous/anxious.     All other systems reviewed and are negative.  The following is a summary of the past history medically, past history surgically, known current medicines, social history and family history.  This information is gathered electronically by the computer from prior information and documentation.  I review this each visit and have found including this information at this point in the chart is beneficial and informative.    Past Medical History:  Diagnosis Date  . Anxiety   . Arthritis   . Chronic back pain   . Depression   . Diabetes mellitus    lost weight and is not on meds now.  . High cholesterol   . Hypertension   . Migraine   . Neuropathic pain of  foot   . Plantar fasciitis   . Sciatica   . Torn rotator cuff     Past Surgical History:  Procedure Laterality Date  . BACK SURGERY     herniated disc; had disc removed and then fusion; total of 3 surgeries  . EYE SURGERY Bilateral    removal of cyst and straightening of muscles  . KNEE ARTHROSCOPY WITH MEDIAL MENISECTOMY Left 12/27/2016   Procedure: LEFT KNEE DIAGNOSTIC ARTHROSCOPY;  Surgeon: Vickki Hearing, MD;  Location: [facility];  Service: Orthopedics;  Laterality: Left;  . TUBAL LIGATION      Family History  Problem Relation Age of Onset  . Heart failure Mother   . COPD Mother   . Diabetes Mother   . Heart failure Father   . Diabetes Other     Social History Social History   Tobacco Use  . Smoking status: Current Every Day Smoker    Packs/day: 0.50    Years: 20.00    Pack years: 10.00    Types: Cigarettes  . Smokeless tobacco: Never Used  Substance Use Topics  . Alcohol use: No  . Drug use: No    Allergies  Allergen Reactions  . Codeine Other (See Comments)    Upset Stomach  . Benadryl [Diphenhydramine Hcl] Palpitations  . Compazine Palpitations    Current Outpatient Medications  Medication Sig Dispense Refill  . acetaminophen (TYLENOL) 325 MG tablet Take 650 mg by mouth every 6 (six) hours as needed.    Marland Kitchen  ibuprofen (ADVIL,MOTRIN) 200 MG tablet Take 800 mg by mouth every 8 (eight) hours as needed (for pain.).    Marland Kitchen naproxen sodium (ALEVE) 220 MG tablet Take 220 mg by mouth.    Marland Kitchen albuterol (VENTOLIN HFA) 108 (90 Base) MCG/ACT inhaler Inhale 2 puffs into the lungs every 6 (six) hours as needed for up to 7 days for wheezing (coughing). (Tina not taking: Reported on 08/06/2018) 1 Inhaler 0  . HYDROcodone-acetaminophen (NORCO/VICODIN) 5-325 MG tablet One tablet every six hours for pain.  Limit 7 days. 28 tablet 0  . isometheptene-acetaminophen-dichloralphenazone (MIDRIN) 65-100-325 MG capsule Take 1 capsule by mouth 4 (four) times daily as needed for  migraine. Maximum 5 capsules in 12 hours for migraine headaches, 8 capsules in 24 hours for tension headaches. (Tina not taking: Reported on 08/06/2018) 30 capsule 0   No current facility-administered medications for this visit.      Physical Exam  Blood pressure (!) 157/90, pulse (!) 110, height 5\' 8"  (1.727 m), weight 240 lb (108.9 kg).  Constitutional: overall normal hygiene, normal nutrition, well developed, normal grooming, normal body habitus. Assistive device:none  Musculoskeletal: gait and station Limp none, muscle tone and strength are normal, no tremors or atrophy is present.  .  Neurological: coordination overall normal.  Deep tendon reflex/nerve stretch intact.  Sensation normal.  Cranial nerves II-XII intact.   Skin:   Normal overall no scars, lesions, ulcers or rashes. No psoriasis.  Psychiatric: Alert and oriented x 3.  Recent memory intact, remote memory unclear.  Normal mood and affect. Well groomed.  Good eye contact.  Cardiovascular: overall no swelling, no varicosities, no edema bilaterally, normal temperatures of the legs and arms, no clubbing, cyanosis and good capillary refill.  Lymphatic: palpation is normal.  Right shoulder has full motion but painful in extremes and past abduction 90.  NV intact. She has no effusion.  All other systems reviewed and are negative   The Tina has been educated about the nature of the problem(s) and counseled on treatment options.  The Tina appeared to understand what I have discussed and is in agreement with it.  Encounter Diagnosis  Name Primary?  . Cigarette nicotine dependence without complication Yes    PLAN Call if any problems.  Precautions discussed.  Continue current medications.   Return to clinic to see Dr. Romeo Apple  I have reviewed the [facility] Controlled Substance Reporting System web site prior to prescribing narcotic medicine for this Tina.     Electronically Signed Darreld Mclean,  MD 2/11/20209:22 AM

## 2018-09-13 ENCOUNTER — Ambulatory Visit: Payer: BLUE CROSS/BLUE SHIELD | Admitting: Orthopedic Surgery

## 2018-09-13 ENCOUNTER — Ambulatory Visit (INDEPENDENT_AMBULATORY_CARE_PROVIDER_SITE_OTHER): Payer: BLUE CROSS/BLUE SHIELD

## 2018-09-13 ENCOUNTER — Encounter: Payer: Self-pay | Admitting: Orthopedic Surgery

## 2018-09-13 VITALS — BP 159/80 | HR 84 | Ht 68.0 in | Wt 240.0 lb

## 2018-09-13 DIAGNOSIS — M25511 Pain in right shoulder: Secondary | ICD-10-CM

## 2018-09-13 DIAGNOSIS — G8929 Other chronic pain: Secondary | ICD-10-CM

## 2018-09-13 DIAGNOSIS — M75111 Incomplete rotator cuff tear or rupture of right shoulder, not specified as traumatic: Secondary | ICD-10-CM | POA: Diagnosis not present

## 2018-09-13 MED ORDER — HYDROCODONE-ACETAMINOPHEN 5-325 MG PO TABS: ORAL_TABLET | ORAL | 0 refills | Status: DC

## 2018-09-13 NOTE — Progress Notes (Signed)
PREOP CONSULT/REFERRAL INTRA-OFFICE FROM DR Gaylene Brooks   Chief Complaint  Patient presents with  . 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  The patient started having pain about 3 years ago.  She has been through an adequate course of nonoperative treatment which has included physical therapy, oral medication and injection.  She complains of right shoulder pain radiates to her elbow her pain is worse with the arm away from her body in abduction.  She describes a dull aching sensation that runs into her elbow she rates her pain at times 8 out of 10.  She has had an MRI which shows she has a PASTA  lesion of the right rotator cuff.   Review of Systems  Genitourinary: Negative for dysuria, frequency, hematuria and urgency.  Endo/Heme/Allergies: Negative for polydipsia.  All other systems reviewed and are negative.    Past Medical History:  Diagnosis Date  . Anxiety   . Arthritis   . Chronic back pain   . Depression   . Diabetes mellitus    lost weight and is not on meds now.  . High cholesterol   . Hypertension   . Migraine   . Neuropathic pain of foot   . Plantar fasciitis   . Sciatica   . Torn rotator cuff     Past Surgical History:  Procedure Laterality Date  . BACK SURGERY     herniated disc; had disc removed and then fusion; total of 3 surgeries  . EYE SURGERY Bilateral    removal of cyst and straightening of muscles  . KNEE ARTHROSCOPY WITH MEDIAL MENISECTOMY Left 12/27/2016   Procedure: LEFT KNEE DIAGNOSTIC ARTHROSCOPY;  Surgeon: Vickki Hearing, MD;  Location: [facility];  Service: Orthopedics;  Laterality: Left;  . TUBAL LIGATION      Family History  Problem Relation Age of Onset  . Heart failure Mother   . COPD Mother   . Diabetes Mother   . Heart failure  Father   . Diabetes Other    Social History   Tobacco Use  . Smoking status: Current Every Day Smoker    Packs/day: 0.50    Years: 20.00    Pack years: 10.00    Types: Cigarettes  . Smokeless tobacco: Never Used  Substance Use Topics  . Alcohol use: No  . Drug use: No    Allergies  Allergen Reactions  . Codeine Other (See Comments)    Upset Stomach  . Benadryl [Diphenhydramine Hcl] Palpitations  . Compazine Palpitations     Current Meds  Medication Sig  . HYDROcodone-acetaminophen (NORCO/VICODIN) 5-325 MG tablet One tablet every six hours for pain.  Limit 7 days.  . naproxen sodium (ALEVE) 220 MG tablet Take 220 mg by mouth.  . [DISCONTINUED] HYDROcodone-acetaminophen (NORCO/VICODIN) 5-325 MG tablet One tablet every six hours for pain.  Limit 7 days.    BP (!) 159/80   Pulse 84   Ht 5\' 8"  (1.727 m)   Wt 240 lb (108.9 kg)   LMP 08/29/2018   BMI 36.49 kg/m   Physical Exam Vitals signs reviewed.  Constitutional:      General: She is not in acute distress.    Appearance: Normal appearance. She is not ill-appearing, toxic-appearing or diaphoretic.  Neurological:     Mental Status: She is alert and  oriented to person, place, and time.  Psychiatric:        Mood and Affect: Mood normal.        Behavior: Behavior normal.        Thought Content: Thought content normal.        Judgment: Judgment normal.     Ortho Exam   Left shoulder no tenderness normal alignment.  Full range of motion recorded.  No instability on abduction external rotation.  Muscle strength and tone normal.  Neurovascular exam is intact.  Skin normal.  No axillary lymph nodes.  Right shoulder normal internal rotation normal passive external rotation with pain at terminal external rotation.  Tenderness in the proximal shoulder along the deltoid anterior fibers.  AC joint nontender.  Passive range of motion 130 degrees of flexion active range of motion 80 degrees of flexion and 70 degrees of  abduction.  Passive abduction 90 degrees.  She has pain throughout the arc of motion especially from 70 to 100 degrees.  In abduction external rotation the shoulder was stable she had negative sulcus sign.  She had weakness in supraspinatus tendon grade 4 out of 5 internal and external rotation strength normal.  Skin warm dry and intact supraclavicular lymph nodes normal pulse and perfusion normal normal sensation.  Skin warm dry and intact  MEDICAL DECISION SECTION  xrays ordered?  Yes  My independent reading of xrays: Plain films are normal with no glenohumeral arthritis and type I acromion   MRI shows a torn rotator cuff with a undersurface tear on the articular side which is approximately 80% of the supraspinatus tendon  CLINICAL DATA:  Chronic right shoulder pain.   EXAM: MRI OF THE RIGHT SHOULDER WITHOUT CONTRAST   TECHNIQUE: Multiplanar, multisequence MR imaging of the shoulder was performed. No intravenous contrast was administered.   COMPARISON:  None.   FINDINGS: Rotator cuff: There is a deep focal partial-thickness articular surface tear of the distal supraspinatus tendon best seen on images 9 and 10 of series 4. The tear extends 80% of the way through the tendon with a few superficial fibers remaining intact. The remainder of the rotator cuff is normal.   Muscles: No atrophy or abnormal signal of the muscles of the rotator cuff.   Biceps long head:  Properly located and intact.   Acromioclavicular Joint: Minimal arthritic changes. Type 1 acromion. No bursitis. Glenohumeral Joint: No joint effusion. No chondral defect.   Labrum:  Intact.   Bones:  No marrow abnormality, fracture or dislocation.   Other: None   IMPRESSION: Focal small deep partial-thickness articular surface tear of the distal supraspinatus tendon.     Electronically Signed   By: Francene BoyersJames  Maxwell M.D.   On: 09/02/2018 14:56     Encounter Diagnoses  Name Primary?  . Chronic right  shoulder pain   . Nontraumatic incomplete tear of right rotator cuff Yes     PLAN:   Vernona RiegerLaura will have an A1c and glucose done at preop she understands that if her A1c is abnormally high then we will postpone the surgery and get her seen by medicine.  She understands that she will be out of work between 2 and 6 weeks depending on what they allow her to do after the surgery.  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 Alternative treatment has also been reviewed, questions were encouraged and answered. The postoperative plan is also been reviewed.  Nonsurgical treatment as described in the history and physical section was attempted and unsuccessful and the patient has agreed to proceed with surgical intervention to improve their situation.  Postop pain management Hydrocodone 10 mg for 2 weeks Hydrocodone 7.5 mg for 2 weeks Hydrocodone 5 mg for 2 weeks Stop opioids  Meds ordered this encounter  Medications  . HYDROcodone-acetaminophen (NORCO/VICODIN) 5-325 MG tablet    Sig: One tablet every six hours for pain.  Limit 7 days.    Dispense:  28 tablet    Refill:  0    Fuller Canada, MD 09/13/2018 12:20 PM

## 2018-09-13 NOTE — Patient Instructions (Addendum)
Rotator Cuff Tear  A rotator cuff tear is a partial or complete tear of the cord-like bands (tendons) that connect muscle to bone in the rotator cuff. The rotator cuff is a group of muscles and tendons that surround the shoulder joint and keep the upper arm bone (humerus) in the shoulder socket. The tear can occur suddenly (acute tear) or can develop over a long period of time (chronic tear). What are the causes? Acute tears may be caused by:  A fall, especially on an outstretched arm.  Lifting very heavy objects with a jerking motion. Chronic tears may be caused by overuse of the muscles. This may happen in sports, physical work, or activities in which your arm repeatedly moves over your head. What increases the risk? This condition is more likely to occur in:  Athletes and workers who frequently use their shoulder or reach over their heads. This may include activities such as: ? Tennis. ? Baseball and softball. ? Swimming and rowing. ? Weightlifting. ? Construction work. ? Painting.  People who smoke.  Older people who have arthritis or poor blood supply. These can make the muscles and tendons weaker. What are the signs or symptoms? Symptoms of this condition depend on the type and severity of the injury:  An acute tear may include a sudden tearing feeling, followed by severe pain that goes from your upper shoulder, down your arm, and toward your elbow.  A chronic tear includes a gradual weakness and decreased shoulder motion as the pain gets worse. The pain is usually worse at night. Both types may have symptoms such as:  Pain that spreads (radiates) from the shoulder to the upper arm.  Swelling and tenderness in front of the shoulder.  Decreased range of motion.  Pain when: ? Reaching, pulling, or lifting the arm above the head. ? Lowering the arm from above the head.  Not being able to raise your arm out to the side.  Difficulty placing the arm behind your back. How  is this diagnosed? This condition is diagnosed with a medical history and physical exam. Imaging tests may also be done, including:  X-rays.  MRI.  Ultrasound.  CT or MR arthrogram. During this test, a contrast material is injected into your shoulder and then images are taken. How is this treated? Treatment for this condition depends on the type and severity of the condition. In less severe cases, treatment may include:  Rest. This may be done with a sling that holds the shoulder still (immobilization). Your health care provider may also recommend avoiding activities that involve lifting your arm over your head.  Icing the shoulder.  Anti-inflammatory medicines, such as aspirin or ibuprofen.  Strengthening and stretching exercises. Your health care provider may recommend specific exercises to improve your range of motion and strengthen your shoulder. In more severe cases, treatment may include:  Physical therapy.  Steroid injections.  Surgery. Follow these instructions at home: Managing pain, stiffness, and swelling  If directed, put ice on the injured area. ? If you have a removable sling, remove it as told by your health care provider. ? Put ice in a plastic bag. ? Place a towel between your skin and the bag. ? Leave the ice on for 20 minutes, 2-3 times a day.  Raise (elevate) the injured area above the level of your heart while you are lying down.  Find a comfortable sleeping position or sleep on a recliner, if available.  Move your fingers often to avoid stiffness   and to lessen swelling.  Once the swelling has gone down, your health care provider may direct you to apply heat to relax the muscles. Use the heat source that your health care provider recommends, such as a moist heat pack or a heating pad. ? Place a towel between your skin and the heat source. ? Leave the heat on for 20-30 minutes. ? Remove the heat if your skin turns bright red. This is especially  important if you are unable to feel pain, heat, or cold. You may have a greater risk of getting burned. If you have a sling:  Wear the sling as told by your health care provider. Remove it only as told by your health care provider.  Loosen the sling if your fingers tingle, become numb, or turn cold and blue.  Keep the sling clean.  If the sling is not waterproof: ? Do not let it get wet. ? Cover it with a watertight covering when you take a bath or a shower. Driving  Do not drive or use heavy machinery while taking prescription pain medicine.  Ask your health care provider when it is safe to drive if you have a sling on your arm. Activity  Rest your shoulder as told by your health care provider.  Return to your normal activities as told by your health care provider. Ask your health care provider what activities are safe for you.  Do any exercises or stretches as told by your health care provider. General instructions  Do not use any products that contain nicotine or tobacco, such as cigarettes and e-cigarettes. If you need help quitting, ask your health care provider.  Take over-the-counter and prescription medicines only as told by your health care provider.  Keep all follow-up visits as told by your health care provider. This is important. Contact a health care provider if:  Your pain gets worse.  You have new pain in your arm, hands, or fingers.  Medicine does not help your pain. Get help right away if:  Your arm, hand, or fingers are numb or tingling.  Your arm, hand, or fingers are swollen or painful or they turn white or blue.  Your hand or fingers on your injured arm are colder than your other hand. Summary  A rotator cuff tear is a partial or complete tear of the cord-like bands (tendons) that connect muscle to bone in the rotator cuff.  The tear can occur suddenly (acute tear) or can develop over a long period of time (chronic tear).  Treatment generally  includes rest, anti-inflammatory medicines, and icing. In some cases, physical therapy and steroid injections may be needed. In severe cases, surgery may be needed. This information is not intended to replace advice given to you by your health care provider. Make sure you discuss any questions you have with your health care provider. Document Released: 07/07/2000 Document Revised: 09/25/2016 Document Reviewed: 09/25/2016 Elsevier Interactive Patient Education  2019 [employer].   You have decided to proceed with rotator cuff repair surgery. You have decided not to continue with nonoperative measures such as but not limited to oral medication,   activity modification, physical therapy, or injection.  We will perform a rotator cuff repair. Some of the risks associated with rotator cuff repair include but are not limited to Bleeding Infection Swelling Stiffness Blood clot Pain Re-tearing of the rotator cuff Failure of the rotator cuff to heal  In compliance with recent [place] law in federal regulation regarding opioid use  and abuse and addiction, we will taper (stop) opioid medication after 6 weeks.  If you're not comfortable with these risks and would like to continue with nonoperative treatment please let Dr. Romeo Apple know prior to your surgery.

## 2018-09-16 NOTE — Patient Instructions (Signed)
Your procedure is scheduled on: 09/19/2018  Report to [facility] at   6:15  AM.  Call this number if you have problems the morning of surgery: [phone]   Remember:   Do not Eat or Drink after midnight   :  Take these medicines the morning of surgery with A SIP OF WATER: Use Albuterol inhaler and Norco if needed  Do not wear jewelry, make-up or nail polish.  Do not wear lotions, powders, or perfumes. You may wear deodorant.  Do not shave 48 hours prior to surgery. Men may shave face and neck.  Do not bring valuables to the hospital.  Contacts, dentures or bridgework may not be worn into surgery.  Leave suitcase in the car. After surgery it may be brought to your room.  For patients admitted to the hospital, checkout time is 11:00 AM the day of discharge.   Patients discharged the day of surgery will not be allowed to drive home.    Special Instructions: Shower using CHG night before surgery and shower the day of surgery use CHG.  Use special wash - you have one bottle of CHG for all showers.  You should use approximately 1/2 of the bottle for each shower.  Rotator Cuff Tear  A rotator cuff tear is a partial or complete tear of the cord-like bands (tendons) that connect muscle to bone in the rotator cuff. The rotator cuff is a group of muscles and tendons that surround the shoulder joint and keep the upper arm bone (humerus) in the shoulder socket. The tear can occur suddenly (acute tear) or can develop over a long period of time (chronic tear). What are the causes? Acute tears may be caused by:  A fall, especially on an outstretched arm.  Lifting very heavy objects with a jerking motion. Chronic tears may be caused by overuse of the muscles. This may happen in sports, physical work, or activities in which your arm repeatedly moves over your head. What increases the risk? This condition is more likely to occur in:  Athletes and workers who frequently use their shoulder or reach  over their heads. This may include activities such as: ? Tennis. ? Baseball and softball. ? Swimming and rowing. ? Weightlifting. ? Holiday representative work. ? Painting.  People who smoke.  Older people who have arthritis or poor blood supply. These can make the muscles and tendons weaker. What are the signs or symptoms? Symptoms of this condition depend on the type and severity of the injury:  An acute tear may include a sudden tearing feeling, followed by severe pain that goes from your upper shoulder, down your arm, and toward your elbow.  A chronic tear includes a gradual weakness and decreased shoulder motion as the pain gets worse. The pain is usually worse at night. Both types may have symptoms such as:  Pain that spreads (radiates) from the shoulder to the upper arm.  Swelling and tenderness in front of the shoulder.  Decreased range of motion.  Pain when: ? Reaching, pulling, or lifting the arm above the head. ? Lowering the arm from above the head.  Not being able to raise your arm out to the side.  Difficulty placing the arm behind your back. How is this diagnosed? This condition is diagnosed with a medical history and physical exam. Imaging tests may also be done, including:  X-rays.  MRI.  Ultrasound.  CT or MR arthrogram. During this test, a contrast material is injected into your shoulder  and then images are taken. How is this treated? Treatment for this condition depends on the type and severity of the condition. In less severe cases, treatment may include:  Rest. This may be done with a sling that holds the shoulder still (immobilization). Your health care provider may also recommend avoiding activities that involve lifting your arm over your head.  Icing the shoulder.  Anti-inflammatory medicines, such as aspirin or ibuprofen.  Strengthening and stretching exercises. Your health care provider may recommend specific exercises to improve your range of  motion and strengthen your shoulder. In more severe cases, treatment may include:  Physical therapy.  Steroid injections.  Surgery. Follow these instructions at home: Managing pain, stiffness, and swelling  If directed, put ice on the injured area. ? If you have a removable sling, remove it as told by your health care provider. ? Put ice in a plastic bag. ? Place a towel between your skin and the bag. ? Leave the ice on for 20 minutes, 2-3 times a day.  Raise (elevate) the injured area above the level of your heart while you are lying down.  Find a comfortable sleeping position or sleep on a recliner, if available.  Move your fingers often to avoid stiffness and to lessen swelling.  Once the swelling has gone down, your health care provider may direct you to apply heat to relax the muscles. Use the heat source that your health care provider recommends, such as a moist heat pack or a heating pad. ? Place a towel between your skin and the heat source. ? Leave the heat on for 20-30 minutes. ? Remove the heat if your skin turns bright red. This is especially important if you are unable to feel pain, heat, or cold. You may have a greater risk of getting burned. If you have a sling:  Wear the sling as told by your health care provider. Remove it only as told by your health care provider.  Loosen the sling if your fingers tingle, become numb, or turn cold and blue.  Keep the sling clean.  If the sling is not waterproof: ? Do not let it get wet. ? Cover it with a watertight covering when you take a bath or a shower. Driving  Do not drive or use heavy machinery while taking prescription pain medicine.  Ask your health care provider when it is safe to drive if you have a sling on your arm. Activity  Rest your shoulder as told by your health care provider.  Return to your normal activities as told by your health care provider. Ask your health care provider what activities are safe  for you.  Do any exercises or stretches as told by your health care provider. General instructions  Do not use any products that contain nicotine or tobacco, such as cigarettes and e-cigarettes. If you need help quitting, ask your health care provider.  Take over-the-counter and prescription medicines only as told by your health care provider.  Keep all follow-up visits as told by your health care provider. This is important. Contact a health care provider if:  Your pain gets worse.  You have new pain in your arm, hands, or fingers.  Medicine does not help your pain. Get help right away if:  Your arm, hand, or fingers are numb or tingling.  Your arm, hand, or fingers are swollen or painful or they turn white or blue.  Your hand or fingers on your injured arm are colder than your  other hand. Summary  A rotator cuff tear is a partial or complete tear of the cord-like bands (tendons) that connect muscle to bone in the rotator cuff.  The tear can occur suddenly (acute tear) or can develop over a long period of time (chronic tear).  Treatment generally includes rest, anti-inflammatory medicines, and icing. In some cases, physical therapy and steroid injections may be needed. In severe cases, surgery may be needed. This information is not intended to replace advice given to you by your health care provider. Make sure you discuss any questions you have with your health care provider. Document Released: 07/07/2000 Document Revised: 09/25/2016 Document Reviewed: 09/25/2016 Elsevier Interactive Patient Education  2019 [employer]  Surgery for Rotator Cuff Tear, Care After Refer to this sheet in the next few weeks. These instructions provide you with information about caring for yourself after your procedure. Your health care provider may also give you more specific instructions. Your treatment has been planned according to current medical practices, but problems sometimes occur. Call  your health care provider if you have any problems or questions after your procedure. What can I expect after the procedure? After the procedure, it is common to have:  Swelling.  Pain.  Stiffness.  Tenderness. Follow these instructions at home: If you have a sling:   Wear the sling as told by your health care provider. Remove it only as told by your health care provider.  Loosen the sling if your fingers tingle, become numb, or turn cold and blue.  Do not let your sling get wet if it is not waterproof.  Keep the sling clean. Bathing  Do not take baths, swim, or use a hot tub until your health care provider approves. Ask your health care provider if you can take showers. You may only be allowed to take sponge baths for bathing.  Keep your bandage (dressing) dry until your health care provider says it can be removed. Incision care  Follow instructions from your health care provider about how to take care of your incision. Make sure you: ? Wash your hands with soap and water before you change your dressing. If soap and water are not available, use hand sanitizer. ? Change your dressing as told by your health care provider. ? Leave stitches (sutures), skin glue, or adhesive strips in place. These skin closures may need to stay in place for 2 weeks or longer. If adhesive strip edges start to loosen and curl up, you may trim the loose edges. Do not remove adhesive strips completely unless your health care provider tells you to do that.  Check your incision area every day for signs of infection. Check for: ? More redness, swelling, or pain. ? More fluid or blood. ? Warmth. ? Pus or a bad smell. Managing pain, stiffness, and swelling   If directed, put ice on your shoulder area. ? Put ice in a plastic bag. ? Place a towel between your skin and the bag. ? Leave the ice on for 20 minutes, 2-3 times a day.  Move your fingers often to avoid stiffness and to lessen  swelling.  Raise (elevate) your upper body on pillows when you lie down and when you sleep. ? Do not sleep on the front of your body (abdomen). ? Do not sleep on the side that your surgery was performed on. Driving  Do not drive for 24 hours if you received a medicine to help you relax (sedative) during your procedure.  Do not drive  or operate heavy machinery while taking prescription pain medicine.  Ask your health care provider when it is safe for you to drive. Activity  Do not use your arm to support your body weight until your health care provider approves.  Do not lift or hold anything with your arm until your health care provider approves.  Return to your normal activities as told by your health care provider. Ask your health care provider what activities are safe for you.  Do exercises as told by your health care provider. General instructions   Do not use any tobacco products, such as cigarettes, chewing tobacco, or e-cigarettes. Tobacco can delay healing. If you need help quitting, ask your health care provider.  Take over-the-counter and prescription medicines only as told by your health care provider.  If you were prescribed an antibiotic medicine, take it as told by your health care provider. Do not stop taking the antibiotic even if you start to feel better.  Keep all follow-up visits as told by your health care provider. This is important. Contact a health care provider if:  You have a fever.  You have more redness, swelling, or pain around your incision.  You have more fluid or blood coming from your incision.  Your incision feels warm to the touch.  You have pus or a bad smell coming from your incision.  You have pain that gets worse or does not get better with medicine. Get help right away if:  You have severe pain.  You lose feeling in your arm or hand.  Your hand or fingers turn very pale or blue. This information is not intended to replace advice  given to you by your health care provider. Make sure you discuss any questions you have with your health care provider. Document Released: 07/10/2005 Document Revised: 03/08/2018 Document Reviewed: 07/24/2015 Elsevier Interactive Patient Education  2019 [employer].  General Anesthesia, Adult, Care After This sheet gives you information about how to care for yourself after your procedure. Your health care provider may also give you more specific instructions. If you have problems or questions, contact your health care provider. What can I expect after the procedure? After the procedure, the following side effects are common:  Pain or discomfort at the IV site.  Nausea.  Vomiting.  Sore throat.  Trouble concentrating.  Feeling cold or chills.  Weak or tired.  Sleepiness and fatigue.  Soreness and body aches. These side effects can affect parts of the body that were not involved in surgery. Follow these instructions at home:  For at least 24 hours after the procedure:  Have a responsible adult stay with you. It is important to have someone help care for you until you are awake and alert.  Rest as needed.  Do not: ? Participate in activities in which you could fall or become injured. ? Drive. ? Use heavy machinery. ? Drink alcohol. ? Take sleeping pills or medicines that cause drowsiness. ? Make important decisions or sign legal documents. ? Take care of children on your own. Eating and drinking  Follow any instructions from your health care provider about eating or drinking restrictions.  When you feel hungry, start by eating small amounts of foods that are soft and easy to digest (bland), such as toast. Gradually return to your regular diet.  Drink enough fluid to keep your urine pale yellow.  If you vomit, rehydrate by drinking water, juice, or clear broth. General instructions  If you have sleep apnea,  surgery and certain medicines can increase your risk for  breathing problems. Follow instructions from your health care provider about wearing your sleep device: ? Anytime you are sleeping, including during daytime naps. ? While taking prescription pain medicines, sleeping medicines, or medicines that make you drowsy.  Return to your normal activities as told by your health care provider. Ask your health care provider what activities are safe for you.  Take over-the-counter and prescription medicines only as told by your health care provider.  If you smoke, do not smoke without supervision.  Keep all follow-up visits as told by your health care provider. This is important. Contact a health care provider if:  You have nausea or vomiting that does not get better with medicine.  You cannot eat or drink without vomiting.  You have pain that does not get better with medicine.  You are unable to pass urine.  You develop a skin rash.  You have a fever.  You have redness around your IV site that gets worse. Get help right away if:  You have difficulty breathing.  You have chest pain.  You have blood in your urine or stool, or you vomit blood. Summary  After the procedure, it is common to have a sore throat or nausea. It is also common to feel tired.  Have a responsible adult stay with you for the first 24 hours after general anesthesia. It is important to have someone help care for you until you are awake and alert.  When you feel hungry, start by eating small amounts of foods that are soft and easy to digest (bland), such as toast. Gradually return to your regular diet.  Drink enough fluid to keep your urine pale yellow.  Return to your normal activities as told by your health care provider. Ask your health care provider what activities are safe for you. This information is not intended to replace advice given to you by your health care provider. Make sure you discuss any questions you have with your health care provider. Document  Released: 10/16/2000 Document Revised: 02/23/2017 Document Reviewed: 02/23/2017 Elsevier Interactive Patient Education  2019 [employer].

## 2018-09-17 ENCOUNTER — Encounter (HOSPITAL_COMMUNITY)
Admission: RE | Admit: 2018-09-17 | Discharge: 2018-09-17 | Disposition: A | Discharge status: Home / Self Care | Payer: BLUE CROSS/BLUE SHIELD | Source: Ambulatory Visit | Attending: Orthopedic Surgery | Admitting: Orthopedic Surgery

## 2018-09-17 ENCOUNTER — Other Ambulatory Visit: Payer: Self-pay

## 2018-09-17 ENCOUNTER — Encounter (HOSPITAL_COMMUNITY): Payer: Self-pay

## 2018-09-17 DIAGNOSIS — I10 Essential (primary) hypertension: Secondary | ICD-10-CM | POA: Diagnosis not present

## 2018-09-17 DIAGNOSIS — Z01818 Encounter for other preprocedural examination: Secondary | ICD-10-CM | POA: Diagnosis present

## 2018-09-17 HISTORY — DX: Unspecified chronic bronchitis: J42

## 2018-09-17 HISTORY — DX: Personal history of urinary calculi: Z87.442

## 2018-09-17 LAB — CBC WITH DIFFERENTIAL/PLATELET
Abs Immature Granulocytes: 0.04 10*3/uL (ref 0.00–0.07)
Basophils Absolute: 0 10*3/uL (ref 0.0–0.1)
Basophils Relative: 1 %
Eosinophils Absolute: 0.2 10*3/uL (ref 0.0–0.5)
Eosinophils Relative: 3 %
HCT: 33.2 % — ABNORMAL LOW (ref 36.0–46.0)
Hemoglobin: 9.4 g/dL — ABNORMAL LOW (ref 12.0–15.0)
Immature Granulocytes: 1 %
Lymphocytes Relative: 25 %
Lymphs Abs: 1.9 10*3/uL (ref 0.7–4.0)
MCH: 21.9 pg — ABNORMAL LOW (ref 26.0–34.0)
MCHC: 28.3 g/dL — ABNORMAL LOW (ref 30.0–36.0)
MCV: 77.2 fL — ABNORMAL LOW (ref 80.0–100.0)
Monocytes Absolute: 0.4 10*3/uL (ref 0.1–1.0)
Monocytes Relative: 6 %
Neutro Abs: 5 10*3/uL (ref 1.7–7.7)
Neutrophils Relative %: 64 %
Platelets: 369 10*3/uL (ref 150–400)
RBC: 4.3 MIL/uL (ref 3.87–5.11)
RDW: 17.6 % — ABNORMAL HIGH (ref 11.5–15.5)
WBC: 7.5 10*3/uL (ref 4.0–10.5)
nRBC: 0 % (ref 0.0–0.2)

## 2018-09-17 LAB — BASIC METABOLIC PANEL
Anion gap: 9 (ref 5–15)
BUN: 9 mg/dL (ref 6–20)
CO2: 23 mmol/L (ref 22–32)
Calcium: 8.8 mg/dL — ABNORMAL LOW (ref 8.9–10.3)
Chloride: 106 mmol/L (ref 98–111)
Creatinine, Ser: 0.45 mg/dL (ref 0.44–1.00)
GFR calc Af Amer: >60 mL/min (ref >60)
GFR calc non Af Amer: >60 mL/min (ref >60)
Glucose, Bld: 218 mg/dL — ABNORMAL HIGH (ref 70–99)
Potassium: 3.7 mmol/L (ref 3.5–5.1)
Sodium: 138 mmol/L (ref 135–145)

## 2018-09-17 LAB — HEMOGLOBIN A1C
Hgb A1c MFr Bld: 9.1 % — ABNORMAL HIGH (ref 4.8–5.6)
Mean Plasma Glucose: 214.47 mg/dL

## 2018-09-17 LAB — GLUCOSE, CAPILLARY: Glucose-Capillary: 219 mg/dL — ABNORMAL HIGH (ref 70–99)

## 2018-09-17 LAB — HCG, SERUM, QUALITATIVE: Preg, Serum: NEGATIVE

## 2018-09-17 NOTE — Pre-Procedure Instructions (Signed)
HgbA1C and CBC routed to Dr Romeo Apple.

## 2018-09-18 NOTE — OR Nursing (Signed)
Hgb reported to Dr. Sharee Pimple,  9.4.  No further orders given.

## 2018-09-18 NOTE — H&P (Signed)
PREOP CONSULT/REFERRAL INTRA-OFFICE FROM DR Gaylene Brooks     Chief Complaint  Patient presents with  . 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  The patient started having pain about 3 years ago.  She has been through an adequate course of nonoperative treatment which has included physical therapy, oral medication and injection.  She complains of right shoulder pain radiates to her elbow her pain is worse with the arm away from her body in abduction.  She describes a dull aching sensation that runs into her elbow she rates her pain at times 8 out of 10.   She has had an MRI which shows she has a PASTA  lesion of the right rotator cuff.     Review of Systems  Genitourinary: Negative for dysuria, frequency, hematuria and urgency.  Endo/Heme/Allergies: Negative for polydipsia.  All other systems reviewed and are negative.           Past Medical History:  Diagnosis Date  . Anxiety    . Arthritis    . Chronic back pain    . Depression    . Diabetes mellitus      lost weight and is not on meds now.  . High cholesterol    . Hypertension    . Migraine    . Neuropathic pain of foot    . Plantar fasciitis    . Sciatica    . Torn rotator cuff             Past Surgical History:  Procedure Laterality Date  . BACK SURGERY        herniated disc; had disc removed and then fusion; total of 3 surgeries  . EYE SURGERY Bilateral      removal of cyst and straightening of muscles  . KNEE ARTHROSCOPY WITH MEDIAL MENISECTOMY Left 12/27/2016    Procedure: LEFT KNEE DIAGNOSTIC ARTHROSCOPY;  Surgeon: Vickki Hearing, MD;  Location: [facility];  Service: Orthopedics;  Laterality: Left;  . TUBAL LIGATION               Family History  Problem Relation Age of Onset  . Heart failure Mother      . COPD Mother    . Diabetes Mother    . Heart failure Father    . Diabetes Other      Social History         Tobacco Use  . Smoking status: Current Every Day Smoker      Packs/day: 0.50      Years: 20.00      Pack years: 10.00      Types: Cigarettes  . Smokeless tobacco: Never Used  Substance Use Topics  . Alcohol use: No  . Drug use: No           Allergies  Allergen Reactions  . Codeine Other (See Comments)      Upset Stomach  . Benadryl [Diphenhydramine Hcl] Palpitations  . Compazine Palpitations        Active Medications  Current Meds  Medication Sig  . HYDROcodone-acetaminophen (NORCO/VICODIN) 5-325 MG tablet One tablet every six hours for pain.  Limit 7 days.  . naproxen sodium (ALEVE) 220 MG tablet Take 220 mg by mouth.  . [DISCONTINUED] HYDROcodone-acetaminophen (NORCO/VICODIN) 5-325 MG tablet One tablet every  six hours for pain.  Limit 7 days.        BP (!) 159/80   Pulse 84   Ht 5\' 8"  (1.727 m)   Wt 240 lb (108.9 kg)   LMP 08/29/2018   BMI 36.49 kg/m    Physical Exam Vitals signs reviewed.  Constitutional:      General: She is not in acute distress.    Appearance: Normal appearance. She is not ill-appearing, toxic-appearing or diaphoretic.  Neurological:     Mental Status: She is alert and oriented to person, place, and time.  Psychiatric:        Mood and Affect: Mood normal.        Behavior: Behavior normal.        Thought Content: Thought content normal.        Judgment: Judgment normal.        Ortho Exam    Left shoulder no tenderness normal alignment.  Full range of motion recorded.  No instability on abduction external rotation.  Muscle strength and tone normal.  Neurovascular exam is intact.  Skin normal.  No axillary lymph nodes.   Right shoulder normal internal rotation normal passive external rotation with pain at terminal external rotation.  Tenderness in the proximal shoulder along the deltoid anterior fibers.  AC joint  nontender.  Passive range of motion 130 degrees of flexion active range of motion 80 degrees of flexion and 70 degrees of abduction.  Passive abduction 90 degrees.  She has pain throughout the arc of motion especially from 70 to 100 degrees.   In abduction external rotation the shoulder was stable she had negative sulcus sign.   She had weakness in supraspinatus tendon grade 4 out of 5 internal and external rotation strength normal.   Skin warm dry and intact supraclavicular lymph nodes normal pulse and perfusion normal normal sensation.  Skin warm dry and intact   MEDICAL DECISION SECTION  xrays ordered?  Yes   My independent reading of xrays: Plain films are normal with no glenohumeral arthritis and type I acromion     MRI shows a torn rotator cuff with a undersurface tear on the articular side which is approximately 80% of the supraspinatus tendon   CLINICAL DATA:  Chronic right shoulder pain.   EXAM: MRI OF THE RIGHT SHOULDER WITHOUT CONTRAST   TECHNIQUE: Multiplanar, multisequence MR imaging of the shoulder was performed. No intravenous contrast was administered.   COMPARISON:  None.   FINDINGS: Rotator cuff: There is a deep focal partial-thickness articular surface tear of the distal supraspinatus tendon best seen on images 9 and 10 of series 4. The tear extends 80% of the way through the tendon with a few superficial fibers remaining intact. The remainder of the rotator cuff is normal.   Muscles: No atrophy or abnormal signal of the muscles of the rotator cuff.   Biceps long head:  Properly located and intact.   Acromioclavicular Joint: Minimal arthritic changes. Type 1 acromion. No bursitis. Glenohumeral Joint: No joint effusion. No chondral defect.   Labrum:  Intact.   Bones:  No marrow abnormality, fracture or dislocation.   Other: None   IMPRESSION: Focal small deep partial-thickness articular surface tear of the distal supraspinatus tendon.      Electronically Signed   By: Francene Boyers M.D.   On: 09/02/2018 14:56         Encounter Diagnoses  Name Primary?  . Chronic right shoulder pain    . Nontraumatic incomplete tear  of right rotator cuff Yes        PLAN:    Zyva will have an A1c and glucose done at preop she understands that if her A1c is abnormally high then we will postpone the surgery and get her seen by medicine.   She understands that she will be out of work between 2 and 6 weeks depending on what they allow her to do after the surgery.  If it is clerical work 2 weeks if it is more than that 6 weeks     Surgical procedure planned: OPEN ROTATOR CUFF REPAIR WITH SPEED SCREW RIGHT SHOULDER    The procedure has been fully reviewed with the patient; The risks and benefits of surgery have been discussed and explained and understood. Alternative treatment has also been reviewed, questions were encouraged and answered. The postoperative plan is also been reviewed.   Nonsurgical treatment as described in the history and physical section was attempted and unsuccessful and the patient has agreed to proceed with surgical intervention to improve their situation.   Postop pain management Hydrocodone 10 mg for 2 weeks Hydrocodone 7.5 mg for 2 weeks Hydrocodone 5 mg for 2 weeks Stop opioids   Meds ordered this encounter  Medications  . HYDROcodone-acetaminophen (NORCO/VICODIN) 5-325 MG tablet      Sig: One tablet every six hours for pain.  Limit 7 days.      Dispense:  28 tablet      Refill:  0      Fuller Canada, MD 09/13/2018 12:20 PM

## 2018-09-19 ENCOUNTER — Encounter (HOSPITAL_COMMUNITY): Payer: Self-pay | Admitting: *Deleted

## 2018-09-19 ENCOUNTER — Ambulatory Visit (HOSPITAL_COMMUNITY): Payer: BLUE CROSS/BLUE SHIELD | Admitting: Anesthesiology

## 2018-09-19 ENCOUNTER — Encounter (HOSPITAL_COMMUNITY)
Admission: RE | Disposition: A | Discharge status: Home / Self Care | Payer: Self-pay | Source: Home / Self Care | Attending: Orthopedic Surgery

## 2018-09-19 ENCOUNTER — Ambulatory Visit (HOSPITAL_COMMUNITY)
Admission: RE | Admit: 2018-09-19 | Discharge: 2018-09-19 | Disposition: A | Discharge status: Home / Self Care | Payer: BLUE CROSS/BLUE SHIELD | Attending: Orthopedic Surgery | Admitting: Orthopedic Surgery

## 2018-09-19 DIAGNOSIS — I10 Essential (primary) hypertension: Secondary | ICD-10-CM | POA: Insufficient documentation

## 2018-09-19 DIAGNOSIS — E1140 Type 2 diabetes mellitus with diabetic neuropathy, unspecified: Secondary | ICD-10-CM | POA: Diagnosis not present

## 2018-09-19 DIAGNOSIS — Z885 Allergy status to narcotic agent status: Secondary | ICD-10-CM | POA: Diagnosis not present

## 2018-09-19 DIAGNOSIS — M75111 Incomplete rotator cuff tear or rupture of right shoulder, not specified as traumatic: Secondary | ICD-10-CM | POA: Insufficient documentation

## 2018-09-19 DIAGNOSIS — M1990 Unspecified osteoarthritis, unspecified site: Secondary | ICD-10-CM | POA: Diagnosis not present

## 2018-09-19 DIAGNOSIS — F17210 Nicotine dependence, cigarettes, uncomplicated: Secondary | ICD-10-CM | POA: Insufficient documentation

## 2018-09-19 DIAGNOSIS — Z888 Allergy status to other drugs, medicaments and biological substances status: Secondary | ICD-10-CM | POA: Diagnosis not present

## 2018-09-19 DIAGNOSIS — Z791 Long term (current) use of non-steroidal anti-inflammatories (NSAID): Secondary | ICD-10-CM | POA: Insufficient documentation

## 2018-09-19 DIAGNOSIS — M75121 Complete rotator cuff tear or rupture of right shoulder, not specified as traumatic: Secondary | ICD-10-CM | POA: Diagnosis not present

## 2018-09-19 DIAGNOSIS — M549 Dorsalgia, unspecified: Secondary | ICD-10-CM | POA: Diagnosis not present

## 2018-09-19 DIAGNOSIS — G8929 Other chronic pain: Secondary | ICD-10-CM | POA: Insufficient documentation

## 2018-09-19 HISTORY — PX: SHOULDER OPEN ROTATOR CUFF REPAIR: SHX2407

## 2018-09-19 LAB — GLUCOSE, CAPILLARY
Glucose-Capillary: 216 mg/dL — ABNORMAL HIGH (ref 70–99)
Glucose-Capillary: 250 mg/dL — ABNORMAL HIGH (ref 70–99)

## 2018-09-19 SURGERY — REPAIR, ROTATOR CUFF, OPEN
Anesthesia: General | Site: Shoulder | Laterality: Right

## 2018-09-19 MED ORDER — HYDROCODONE-ACETAMINOPHEN 7.5-325 MG PO TABS
1.0000 | ORAL_TABLET | Freq: Once | ORAL | Status: AC
  Administered 2018-09-19: 1 via ORAL
  Filled 2018-09-19: qty 1

## 2018-09-19 MED ORDER — ROPIVACAINE HCL 5 MG/ML IJ SOLN
INTRAMUSCULAR | Status: DC | PRN
  Administered 2018-09-19: 18 mL via EPIDURAL

## 2018-09-19 MED ORDER — CEFAZOLIN SODIUM-DEXTROSE 2-4 GM/100ML-% IV SOLN
2.0000 g | INTRAVENOUS | Status: AC
  Administered 2018-09-19: 2 g via INTRAVENOUS
  Filled 2018-09-19: qty 100

## 2018-09-19 MED ORDER — HYDROMORPHONE HCL 1 MG/ML IJ SOLN: 0.2500 mg | INTRAMUSCULAR | Status: DC | PRN

## 2018-09-19 MED ORDER — PREGABALIN 50 MG PO CAPS
50.0000 mg | ORAL_CAPSULE | Freq: Once | ORAL | Status: AC
  Administered 2018-09-19: 50 mg via ORAL
  Filled 2018-09-19: qty 1

## 2018-09-19 MED ORDER — HYDROCODONE-ACETAMINOPHEN 10-325 MG PO TABS: 1.0000 | ORAL_TABLET | ORAL | 0 refills | Status: DC | PRN

## 2018-09-19 MED ORDER — MIDAZOLAM HCL 2 MG/2ML IJ SOLN
INTRAMUSCULAR | Status: AC
  Filled 2018-09-19: qty 4

## 2018-09-19 MED ORDER — DEXAMETHASONE SODIUM PHOSPHATE 4 MG/ML IJ SOLN
INTRAMUSCULAR | Status: AC
  Filled 2018-09-19: qty 2

## 2018-09-19 MED ORDER — ROPIVACAINE HCL 5 MG/ML IJ SOLN
INTRAMUSCULAR | Status: AC
  Filled 2018-09-19: qty 30

## 2018-09-19 MED ORDER — CYCLOBENZAPRINE HCL 10 MG PO TABS: 10.0000 mg | ORAL_TABLET | Freq: Three times a day (TID) | ORAL | 0 refills | Status: DC | PRN

## 2018-09-19 MED ORDER — ONDANSETRON HCL 4 MG/2ML IJ SOLN
4.0000 mg | Freq: Once | INTRAMUSCULAR | Status: AC
  Administered 2018-09-19: 4 mg via INTRAVENOUS
  Filled 2018-09-19: qty 2

## 2018-09-19 MED ORDER — KETOROLAC TROMETHAMINE 30 MG/ML IJ SOLN: 30.0000 mg | Freq: Once | INTRAMUSCULAR | Status: DC | PRN

## 2018-09-19 MED ORDER — BUPIVACAINE-EPINEPHRINE (PF) 0.5% -1:200000 IJ SOLN
INTRAMUSCULAR | Status: AC
  Filled 2018-09-19: qty 60

## 2018-09-19 MED ORDER — FENTANYL CITRATE (PF) 100 MCG/2ML IJ SOLN
INTRAMUSCULAR | Status: DC | PRN
  Administered 2018-09-19 (×2): 50 ug via INTRAVENOUS

## 2018-09-19 MED ORDER — MIDAZOLAM HCL 2 MG/2ML IJ SOLN
INTRAMUSCULAR | Status: AC
  Filled 2018-09-19: qty 2

## 2018-09-19 MED ORDER — ONDANSETRON HCL 4 MG/2ML IJ SOLN: 4.0000 mg | Freq: Once | INTRAMUSCULAR | Status: DC | PRN

## 2018-09-19 MED ORDER — CHLORHEXIDINE GLUCONATE 4 % EX LIQD: 60.0000 mL | Freq: Once | CUTANEOUS | Status: DC

## 2018-09-19 MED ORDER — LACTATED RINGERS IV SOLN
INTRAVENOUS | Status: DC
  Administered 2018-09-19: 08:00:00 via INTRAVENOUS

## 2018-09-19 MED ORDER — CELECOXIB 400 MG PO CAPS
400.0000 mg | ORAL_CAPSULE | Freq: Once | ORAL | Status: AC
  Administered 2018-09-19: 400 mg via ORAL
  Filled 2018-09-19: qty 1

## 2018-09-19 MED ORDER — DEXAMETHASONE SODIUM PHOSPHATE 10 MG/ML IJ SOLN
INTRAMUSCULAR | Status: DC | PRN
  Administered 2018-09-19: 8 mg via INTRAVENOUS
  Administered 2018-09-19: 5 mg via INTRAVENOUS

## 2018-09-19 MED ORDER — METHOCARBAMOL 1000 MG/10ML IJ SOLN
500.0000 mg | Freq: Once | INTRAVENOUS | Status: AC
  Administered 2018-09-19: 500 mg via INTRAVENOUS
  Filled 2018-09-19: qty 5

## 2018-09-19 MED ORDER — MIDAZOLAM HCL 5 MG/5ML IJ SOLN
INTRAMUSCULAR | Status: DC | PRN
  Administered 2018-09-19: 1 mg via INTRAVENOUS
  Administered 2018-09-19: 4 mg via INTRAVENOUS
  Administered 2018-09-19: 1 mg via INTRAVENOUS

## 2018-09-19 MED ORDER — PROPOFOL 10 MG/ML IV BOLUS
INTRAVENOUS | Status: AC
  Filled 2018-09-19: qty 40

## 2018-09-19 MED ORDER — ROCURONIUM BROMIDE 100 MG/10ML IV SOLN
INTRAVENOUS | Status: DC | PRN
  Administered 2018-09-19: 50 mg via INTRAVENOUS
  Administered 2018-09-19: 20 mg via INTRAVENOUS

## 2018-09-19 MED ORDER — 0.9 % SODIUM CHLORIDE (POUR BTL) OPTIME
TOPICAL | Status: DC | PRN
  Administered 2018-09-19: 1000 mL

## 2018-09-19 MED ORDER — BUPIVACAINE-EPINEPHRINE (PF) 0.5% -1:200000 IJ SOLN
INTRAMUSCULAR | Status: DC | PRN
  Administered 2018-09-19: 20 mL via PERINEURAL

## 2018-09-19 MED ORDER — PROMETHAZINE HCL 12.5 MG PO TABS: 12.5000 mg | ORAL_TABLET | Freq: Four times a day (QID) | ORAL | 0 refills | Status: DC | PRN

## 2018-09-19 MED ORDER — MEPERIDINE HCL 50 MG/ML IJ SOLN: 6.2500 mg | INTRAMUSCULAR | Status: DC | PRN

## 2018-09-19 MED ORDER — PROPOFOL 10 MG/ML IV BOLUS
INTRAVENOUS | Status: DC | PRN
  Administered 2018-09-19: 200 mg via INTRAVENOUS

## 2018-09-19 MED ORDER — HYDROCODONE-ACETAMINOPHEN 7.5-325 MG PO TABS: 1.0000 | ORAL_TABLET | Freq: Once | ORAL | Status: DC | PRN

## 2018-09-19 MED ORDER — FENTANYL CITRATE (PF) 100 MCG/2ML IJ SOLN
INTRAMUSCULAR | Status: AC
  Filled 2018-09-19: qty 4

## 2018-09-19 SURGICAL SUPPLY — 49 items
ANCHOR SUT 5.5 SPEEDSCREW (Screw) ×3 IMPLANT
BENZOIN TINCTURE PRP APPL 2/3 (GAUZE/BANDAGES/DRESSINGS) ×3 IMPLANT
BIT DRILL 2.0X128 (BIT) ×2 IMPLANT
BIT DRILL 2.0X128MM (BIT) ×1
BLADE HEX COATED 2.75 (ELECTRODE) ×3 IMPLANT
BLADE OSC/SAGITTAL MD 9X18.5 (BLADE) ×3 IMPLANT
CHLORAPREP W/TINT 26ML (MISCELLANEOUS) ×3 IMPLANT
CLOSURE WOUND 1/2 X4 (GAUZE/BANDAGES/DRESSINGS) ×1
CLOTH BEACON ORANGE TIMEOUT ST (SAFETY) ×3 IMPLANT
CONNECTOR PERFECT PASSER (CONNECTOR) ×3 IMPLANT
COVER LIGHT HANDLE STERIS (MISCELLANEOUS) ×6 IMPLANT
COVER WAND RF STERILE (DRAPES) ×3 IMPLANT
DRAPE ORTHO 2.5IN SPLIT 77X108 (DRAPES) ×2 IMPLANT
DRAPE ORTHO SPLIT 77X108 STRL (DRAPES) ×4
DRAPE PROXIMA HALF (DRAPES) ×3 IMPLANT
DRESSING ALLEVYN 6X6 (MISCELLANEOUS) ×3 IMPLANT
DRESSING ALLEVYN BORDER 5X5 (GAUZE/BANDAGES/DRESSINGS) ×3 IMPLANT
ELECT REM PT RETURN 9FT ADLT (ELECTROSURGICAL) ×3
ELECTRODE REM PT RTRN 9FT ADLT (ELECTROSURGICAL) ×1 IMPLANT
GAUZE 4X4 16PLY RFD (DISPOSABLE) ×3 IMPLANT
GLOVE BIO SURGEON STRL SZ7 (GLOVE) ×3 IMPLANT
GLOVE BIOGEL PI IND STRL 7.0 (GLOVE) ×3 IMPLANT
GLOVE BIOGEL PI INDICATOR 7.0 (GLOVE) ×6
GLOVE SKINSENSE NS SZ8.0 LF (GLOVE) ×2
GLOVE SKINSENSE STRL SZ8.0 LF (GLOVE) ×1 IMPLANT
GLOVE SS N UNI LF 8.5 STRL (GLOVE) ×3 IMPLANT
GOWN STRL REUS W/ TWL LRG LVL3 (GOWN DISPOSABLE) ×1 IMPLANT
GOWN STRL REUS W/TWL LRG LVL3 (GOWN DISPOSABLE) ×8 IMPLANT
GOWN STRL REUS W/TWL XL LVL3 (GOWN DISPOSABLE) ×3 IMPLANT
INST SET MINOR BONE (KITS) ×3 IMPLANT
KIT BLADEGUARD II DBL (SET/KITS/TRAYS/PACK) ×3 IMPLANT
KIT TURNOVER KIT A (KITS) ×3 IMPLANT
MANIFOLD NEPTUNE II (INSTRUMENTS) ×3 IMPLANT
MARKER SKIN DUAL TIP RULER LAB (MISCELLANEOUS) ×3 IMPLANT
NEEDLE HYPO 21X1.5 SAFETY (NEEDLE) ×3 IMPLANT
NS IRRIG 1000ML POUR BTL (IV SOLUTION) ×3 IMPLANT
PACK TOTAL JOINT (CUSTOM PROCEDURE TRAY) ×3 IMPLANT
PAD ARMBOARD 7.5X6 YLW CONV (MISCELLANEOUS) ×3 IMPLANT
PASSER SUT SWANSON 36MM LOOP (INSTRUMENTS) ×3 IMPLANT
SET BASIN LINEN APH (SET/KITS/TRAYS/PACK) ×3 IMPLANT
SLING ARM FOAM STRAP XLG (SOFTGOODS) ×3 IMPLANT
STRIP CLOSURE SKIN 1/2X4 (GAUZE/BANDAGES/DRESSINGS) ×2 IMPLANT
SUT ETHIBOND NAB OS 4 #2 30IN (SUTURE) ×6 IMPLANT
SUT MON AB 0 CT1 (SUTURE) ×3 IMPLANT
SUT MON AB 2-0 CT1 36 (SUTURE) ×3 IMPLANT
SUT PERFECTPASSER WHITE CART (SUTURE) ×3 IMPLANT
SYR 30ML LL (SYRINGE) ×3 IMPLANT
SYR BULB IRRIGATION 50ML (SYRINGE) ×6 IMPLANT
YANKAUER SUCT 12FT TUBE ARGYLE (SUCTIONS) ×3 IMPLANT

## 2018-09-19 NOTE — Anesthesia Procedure Notes (Signed)
Anesthesia Regional Block: Interscalene brachial plexus block   Pre-Anesthetic Checklist: ,, timeout performed, Correct Patient, Correct Site, Correct Laterality, Correct Procedure, Correct Position, site marked, Risks and benefits discussed, at surgeon's request and post-op pain management  Laterality: Upper  Prep: chloraprep       Needles:  Injection technique: Single-shot      Needle Gauge: 25     Additional Needles:   Procedures:,,,, ultrasound used (permanent image in chart),,,,  Narrative:  Start time: 09/19/2018 7:19 AM End time: 09/19/2018 7:30 AM  Performed by: Personally  Anesthesiologist: Shona Needles, MD  Additional Notes: Block assessed prior to start of surgery 18cc Ropivicaine 0.5% with decadron 8mg 

## 2018-09-19 NOTE — Anesthesia Postprocedure Evaluation (Signed)
Anesthesia Post Note  Patient: Tina Pham  Procedure(s) Performed: ROTATOR CUFF REPAIR SHOULDER OPEN (Right Shoulder)  Patient location during evaluation: PACU Anesthesia Type: General Level of consciousness: awake and alert and patient cooperative Pain management: pain level controlled Vital Signs Assessment: post-procedure vital signs reviewed and stable Respiratory status: spontaneous breathing, nonlabored ventilation and respiratory function stable Cardiovascular status: blood pressure returned to baseline Postop Assessment: no apparent nausea or vomiting Anesthetic complications: no     Last Vitals:  Vitals:   09/19/18 0643 09/19/18 0900  BP: (!) 152/93 (!) 148/79  Pulse:  (!) 106  Resp: 17 (!) 22  Temp: 36.9 C 36.4 C  SpO2: 97% 100%    Last Pain:  Vitals:   09/19/18 0900  TempSrc:   PainSc: 0-No pain                 Zeki Bedrosian J

## 2018-09-19 NOTE — Discharge Instructions (Signed)
We were able to repair your rotator cuff tear was a small tear 0.8 cm in length  Keep your sling on at all times except to take a bath no showers.  Do not get the incision wet.  You will have to bathe with a washcloth.  Apply ice packs to the shoulder 30 minutes every 2 hours while awake  You may find that sleeping upright or in a recliner is more comfortable.  PATIENT INSTRUCTIONS POST-ANESTHESIA  IMMEDIATELY FOLLOWING SURGERY:  Do not drive or operate machinery for the first twenty four hours after surgery.  Do not make any important decisions for twenty four hours after surgery or while taking narcotic pain medications or sedatives.  If you develop intractable nausea and vomiting or a severe headache please notify your doctor immediately.  FOLLOW-UP:  Please make an appointment with your surgeon as instructed. You do not need to follow up with anesthesia unless specifically instructed to do so.  WOUND CARE INSTRUCTIONS (if applicable):  Keep a dry clean dressing on the anesthesia/puncture wound site if there is drainage.  Once the wound has quit draining you may leave it open to air.  Generally you should leave the bandage intact for twenty four hours unless there is drainage.  If the epidural site drains for more than 36-48 hours please call the anesthesia department.  QUESTIONS?:  Please feel free to call your physician or the hospital operator if you have any questions, and they will be happy to assist you.      Incision Care, Adult An incision is a cut that a doctor makes in your skin for surgery (for a procedure). Most times, these cuts are closed after surgery. Your cut from surgery may be closed with stitches (sutures), staples, skin glue, or skin tape (adhesive strips). You may need to return to your doctor to have stitches or staples taken out. This may happen many days or many weeks after your surgery. The cut needs to be well cared for so it does not get infected. How to care for  your cut Cut care   Follow instructions from your doctor about how to take care of your cut. Make sure you: ? Wash your hands with soap and water before you change your bandage (dressing). If you cannot use soap and water, use hand sanitizer. ? Change your bandage as told by your doctor. ? Leave stitches, skin glue, or skin tape in place. They may need to stay in place for 2 weeks or longer. If tape strips get loose and curl up, you may trim the loose edges. Do not remove tape strips completely unless your doctor says it is okay.  Check your cut area every day for signs of infection. Check for: ? More redness, swelling, or pain. ? More fluid or blood. ? Warmth. ? Pus or a bad smell.  Ask your doctor how to clean the cut. This may include: ? Using mild soap and water. ? Using a clean towel to pat the cut dry after you clean it. ? Putting a cream or ointment on the cut. Do this only as told by your doctor. ? Covering the cut with a clean bandage.  Ask your doctor when you can leave the cut uncovered.  Do not take baths, swim, or use a hot tub until your doctor says it is okay. Ask your doctor if you can take showers. You may only be allowed to take sponge baths for bathing. Medicines  If you were  prescribed an antibiotic medicine, cream, or ointment, take the antibiotic or put it on the cut as told by your doctor. Do not stop taking or putting on the antibiotic even if your condition gets better.  Take over-the-counter and prescription medicines only as told by your doctor. General instructions  Limit movement around your cut. This helps healing. ? Avoid straining, lifting, or exercise for the first month, or for as long as told by your doctor. ? Follow instructions from your doctor about going back to your normal activities. ? Ask your doctor what activities are safe.  Protect your cut from the sun when you are outside for the first 6 months, or for as long as told by your doctor.  Put on sunscreen around the scar or cover up the scar.  Keep all follow-up visits as told by your doctor. This is important. Contact a doctor if:  Your have more redness, swelling, or pain around the cut.  You have more fluid or blood coming from the cut.  Your cut feels warm to the touch.  You have pus or a bad smell coming from the cut.  You have a fever or shaking chills.  You feel sick to your stomach (nauseous) or you throw up (vomit).  You are dizzy.  Your stitches or staples come undone. Get help right away if:  You have a red streak coming from your cut.  Your cut bleeds through the bandage and the bleeding does not stop with gentle pressure.  The edges of your cut open up and separate.  You have very bad (severe) pain.  You have a rash.  You are confused.  You pass out (faint).  You have trouble breathing and you have a fast heartbeat. This information is not intended to replace advice given to you by your health care provider. Make sure you discuss any questions you have with your health care provider. Document Released: 10/02/2011 Document Revised: 03/17/2016 Document Reviewed: 03/17/2016 Elsevier Interactive Patient Education  2019 [employer]   Shoulder Arthroscopy, Care After This sheet gives you information about how to care for yourself after your procedure. Your health care provider may also give you more specific instructions. If you have problems or questions, contact your health care provider. What can I expect after the procedure? After the procedure, it is common to have:  Pain that can be relieved by taking pain medicine.  Swelling.  A small amount of fluid from the incision.  Stiffness that improves over time. Follow these instructions at home: If you have a sling or immobilizer:  Wear the sling or immobilizer as told by your health care provider. Remove it only as told by your health care provider. These devices protect your  shoulder and help it heal by keeping it in place.  Loosen the sling or immobilizer if your fingers tingle, become numb, or turn cold and blue.  Keep the sling or immobilizer clean.  Ask if you may remove the sling or immobilizer for bathing. If you need to keep it on while bathing and it is not waterproof: ? Do not let it get wet. ? Cover it with a watertight covering when you take a bath or a shower. Incision care   Follow instructions from your health care provider about how to take care of your incisions. Make sure you: ? Wash your hands with soap and water before you change your bandage (dressing). If soap and water are not available, use hand sanitizer. ? Change  your dressing as told by your health care provider. ? Leave stitches (sutures), staples, skin glue, or adhesive strips in place. These skin closures may need to stay in place for 2 weeks or longer. If adhesive strip edges start to loosen and curl up, you may trim the loose edges. Do not remove adhesive strips completely unless your health care provider tells you to do that.  Check your incision areas every day for signs of infection. Check for: ? Redness ? More swelling or pain. ? Blood or more fluid. ? Warmth. ? Pus or a bad smell. Bathing  Do not take baths, swim, or use a hot tub until your health care provider approves. Ask your health care provider if you may take showers. You may only be allowed to take sponge baths. Activity  Ask your health care provider what activities are safe for you during recovery, and ask what activities you need to avoid.  Do not lift with your affected shoulder until your health care provider approves.  Avoid pulling and pushing with the arm on your affected side.  If physical therapy was prescribed, do exercises as directed. Doing exercises may help to improve shoulder movement and flexibility (range of motion). Driving  Do not drive until your health care provider approves.  Do  not drive or use heavy machinery while taking prescription pain medicine. Managing pain, stiffness, and swelling   If lying down flat causes shoulder discomfort, it may help to sleep in a sitting position for a few days after your procedure. Try sleeping in a reclining chair or propping yourself up with extra pillows in bed.  If directed, put ice on the affected area: ? Put ice in a plastic bag or use the icing device (cold therapy unit) that you were given. Follow instructions from your health care provider about how to use the icing device. ? Place a towel between your skin and the bag or between your skin and the icing device. ? Leave the ice on for 20 minutes, 2-3 times a day.  Move your fingers often to avoid stiffness and to lessen swelling. General instructions  Take over-the-counter and prescription medicines only as told by your health care provider.  If you are taking prescription pain medicine, take actions to prevent or treat constipation. Your health care provider may recommend that you: ? Drink enough fluid to keep your urine pale yellow. ? Eat foods that are high in fiber, such as fresh fruits and vegetables, whole grains, and beans. ? Limit foods that are high in fat and processed sugars, such as fried or sweet foods. ? Take an over-the-counter or prescription medicine for constipation.  Do not use any products that contain nicotine or tobacco, such as cigarettes and e-cigarettes. These can delay incision or bone healing. If you need help quitting, ask your health care provider.  Keep all follow-up visits as told by your health care provider. This is important. Contact a health care provider if you:  Have a fever.  Have severe pain.  Have redness around an incision.  Have more swelling or pain in an incision area.  Have blood or more fluid coming from an incision.  Notice that an incision feels warm to the touch.  Notice pus or a bad smell coming from an  incision.  Notice that an incision has opened up.  Develop a rash. Get help right away if you:  Have difficulty breathing.  Have chest pain.  Notice that your fingers tingle, are  numb, or are cold and blue even after you loosen your sling or immobilizer.  Develop pain in your lower leg or at the back of your knee. Summary  If you have a sling or immobilizer, wear it as told by your health care provider. These devices protect your shoulder and help it heal by keeping it in place.  If lying down flat causes shoulder discomfort, it may help to sleep in a sitting position for a few days after your procedure. Try sleeping in a reclining chair, or try propping yourself up with extra pillows in bed.  If physical therapy was prescribed, do exercises as directed. Doing exercises may help to improve shoulder movement and flexibility (range of motion).  Keep all follow-up visits as told by your health care provider. This is important. This information is not intended to replace advice given to you by your health care provider. Make sure you discuss any questions you have with your health care provider. Document Released: 02/04/2014 Document Revised: 05/25/2017 Document Reviewed: 05/25/2017 Elsevier Interactive Patient Education  2019 [employer].

## 2018-09-19 NOTE — Transfer of Care (Signed)
Immediate Anesthesia Transfer of Care Note  Patient: [name]  Procedure(s) Performed: Mora Appl CUFF REPAIR SHOULDER OPEN (Right Shoulder)  Patient Location: PACU  Anesthesia Type:General  Level of Consciousness: awake and patient cooperative  Airway & Oxygen Therapy: Patient Spontanous Breathing and Patient connected to face mask oxygen  Post-op Assessment: Report given to RN, Post -op Vital signs reviewed and stable and Patient moving all extremities  Post vital signs: Reviewed and stable  Last Vitals:  Vitals Value Taken Time  BP 149/81 09/19/2018  8:58 AM  Temp    Pulse 106 09/19/2018  8:59 AM  Resp 22 09/19/2018  8:59 AM  SpO2 86 % 09/19/2018  8:59 AM  Vitals shown include unvalidated device data.  Last Pain:  Vitals:   09/19/18 0643  TempSrc: Oral  PainSc: 6       Patients Stated Pain Goal: 7 (09/19/18 5027)  Complications: No apparent anesthesia complications

## 2018-09-19 NOTE — Interval H&P Note (Signed)
History and Physical Interval Note:  09/19/2018 7:10 AM  Tina Pham  has presented today for surgery, with the diagnosis of right rotator cuff tear  The various methods of treatment have been discussed with the patient and family. After consideration of risks, benefits and other options for treatment, the patient has consented to  Procedure(s): ROTATOR CUFF REPAIR SHOULDER OPEN (Right) as a surgical intervention .  The patient's history has been reviewed, patient examined, no change in status, stable for surgery.  I have reviewed the patient's chart and labs.  Questions were answered to the patient's satisfaction.     Fuller Canada

## 2018-09-19 NOTE — Anesthesia Procedure Notes (Signed)
Procedure Name: Intubation Date/Time: 09/19/2018 7:46 AM Performed by: Charmaine Downs, CRNA Pre-anesthesia Checklist: Patient identified, Patient being monitored, Timeout performed, Emergency Drugs available and Suction available Patient Re-evaluated:Patient Re-evaluated prior to induction Oxygen Delivery Method: Circle System Utilized Preoxygenation: Pre-oxygenation with 100% oxygen Induction Type: IV induction Ventilation: Mask ventilation without difficulty Laryngoscope Size: Mac and 4 Grade View: Grade I Tube type: Oral Tube size: 7.0 mm Number of attempts: 1 Airway Equipment and Method: stylet Placement Confirmation: ETT inserted through vocal cords under direct vision,  positive ETCO2 and breath sounds checked- equal and bilateral Secured at: 22 cm Tube secured with: Tape Dental Injury: Teeth and Oropharynx as per pre-operative assessment

## 2018-09-19 NOTE — Op Note (Addendum)
09/19/2018  8:50 AM  PATIENT:  Tina Pham  43 y.o. female  PRE-OPERATIVE DIAGNOSIS:  right rotator cuff tear  POST-OPERATIVE DIAGNOSIS:  right rotator cuff tear  PROCEDURE:  Procedure(s): ROTATOR CUFF REPAIR SHOULDER OPEN (Right)-23410  Small rotator cuff tear full-thickness 0.8 cm width no retraction  Speed screw 5.5 anchor sURGEON:  Surgeon(s) and Role:    Vickki Hearing, MD - Primary  The surgery was done in the following manner.  The patient was seen in preop chart review was completed the surgical site confirmed and signed with surgeon's initials.  Patient taken to surgery after paracervical block done by anesthesia.  She had general anesthesia in the operating room she was placed in the modified beachchair position and after sterile prep and drape surgical team agreed that the procedure and site were correct images were present and reviewed  The incision was made at the anterolateral corner of the acromion extended distally 2-1/2 cm.  Extend approximately 1 cm.  Subcutaneous tissue was divided down to the deltoid fascia.  Of the anterior raphae was split from the median raphae and a bursectomy was performed  This exposed the rotator cuff tear which was full-thickness and 0.8 cm in width.  Portions of the cuff were thin and you could see through to the greater tuberosity.  This abnormal tendon was debrided.  The greater tuberosity was drilled with two 2.0 drill holes and then the cortical bone was removed with a rasp.  An inverted mattress suture was placed and then a speed screw punch and tap were placed the speed screw anchor was placed at the sutures were placed into the anchor and secured.  There was good excursion laterally and good apposition to the greater tuberosity.  The suture ends were cut flush with the tendon.  Range of motion of the shoulder was checked and was complete and free  The wound was irrigated the deltoid was injected with 10 cc of Marcaine with  epinephrine.  The deltoid split was closed with inverted running 2 Ethibond suture followed by subcutaneous tissue closure with interrupted 0 Monocryl suture and then running 2-0 Monocryl suture followed by benzoin and Steri-Strips  Sterile bandage was applied patient was placed in a sling  Patient was taken to recovery room after extubation in stable condition  PHYSICIAN ASSISTANT:   ASSISTANTS: betty ashely   ANESTHESIA:   general and paracervical block  EBL:  15 mL   BLOOD ADMINISTERED:none  DRAINS: none   LOCAL MEDICATIONS USED:  MARCAINE     SPECIMEN:  No Specimen  DISPOSITION OF SPECIMEN:  N/A  COUNTS:  YES  TOURNIQUET:  * No tourniquets in log *  DICTATION: .Dragon Dictation  PLAN OF CARE: Discharge to home after PACU  PATIENT DISPOSITION:  PACU - hemodynamically stable.   Delay start of Pharmacological VTE agent (>24hrs) due to surgical blood loss or risk of bleeding: not applicable

## 2018-09-19 NOTE — Anesthesia Preprocedure Evaluation (Signed)
Anesthesia Evaluation  Patient identified by MRN, date of birth, ID band Patient awake    Reviewed: Allergy & Precautions, H&P , NPO status , Patient's Chart, lab work & pertinent test results  Airway Mallampati: II  TM Distance: >3 FB Neck ROM: full    Dental no notable dental hx.    Pulmonary neg pulmonary ROS, Current Smoker,    Pulmonary exam normal breath sounds clear to auscultation       Cardiovascular Exercise Tolerance: Good hypertension, negative cardio ROS   Rhythm:regular Rate:Normal     Neuro/Psych  Headaches, PSYCHIATRIC DISORDERS Anxiety Depression  Neuromuscular disease    GI/Hepatic negative GI ROS, Neg liver ROS,   Endo/Other  negative endocrine ROSdiabetes  Renal/GU negative Renal ROS  negative genitourinary   Musculoskeletal   Abdominal   Peds  Hematology negative hematology ROS (+)   Anesthesia Other Findings   Reproductive/Obstetrics negative OB ROS                             Anesthesia Physical Anesthesia Plan  ASA: II  Anesthesia Plan: General   Post-op Pain Management: GA combined w/ Regional for post-op pain   Induction:   PONV Risk Score and Plan:   Airway Management Planned:   Additional Equipment:   Intra-op Plan:   Post-operative Plan:   Informed Consent: I have reviewed the patients History and Physical, chart, labs and discussed the procedure including the risks, benefits and alternatives for the proposed anesthesia with the patient or authorized representative who has indicated his/her understanding and acceptance.     Dental Advisory Given  Plan Discussed with: CRNA  Anesthesia Plan Comments:         Anesthesia Quick Evaluation

## 2018-09-20 ENCOUNTER — Encounter (HOSPITAL_COMMUNITY): Payer: Self-pay | Admitting: Orthopedic Surgery

## 2018-09-20 NOTE — Addendum Note (Signed)
Addendum  created 09/20/18 1400 by Moshe Salisbury, CRNA   Charge Capture section accepted

## 2018-09-20 NOTE — Addendum Note (Signed)
Addendum  created 09/20/18 1404 by Moshe Salisbury, CRNA   Charge Capture section accepted

## 2018-09-25 ENCOUNTER — Other Ambulatory Visit: Payer: Self-pay

## 2018-09-25 MED ORDER — HYDROCODONE-ACETAMINOPHEN 10-325 MG PO TABS: 1.0000 | ORAL_TABLET | ORAL | 0 refills | Status: DC | PRN

## 2018-10-03 ENCOUNTER — Emergency Department (HOSPITAL_COMMUNITY)
Admission: EM | Admit: 2018-10-03 | Discharge: 2018-10-03 | Disposition: A | Discharge status: Home / Self Care | Payer: BLUE CROSS/BLUE SHIELD | Attending: Emergency Medicine | Admitting: Emergency Medicine

## 2018-10-03 ENCOUNTER — Other Ambulatory Visit: Payer: Self-pay

## 2018-10-03 ENCOUNTER — Encounter (HOSPITAL_COMMUNITY): Payer: Self-pay | Admitting: Emergency Medicine

## 2018-10-03 DIAGNOSIS — E1140 Type 2 diabetes mellitus with diabetic neuropathy, unspecified: Secondary | ICD-10-CM | POA: Insufficient documentation

## 2018-10-03 DIAGNOSIS — I10 Essential (primary) hypertension: Secondary | ICD-10-CM | POA: Insufficient documentation

## 2018-10-03 DIAGNOSIS — N390 Urinary tract infection, site not specified: Secondary | ICD-10-CM | POA: Diagnosis not present

## 2018-10-03 DIAGNOSIS — M545 Low back pain: Secondary | ICD-10-CM | POA: Diagnosis present

## 2018-10-03 DIAGNOSIS — F17210 Nicotine dependence, cigarettes, uncomplicated: Secondary | ICD-10-CM | POA: Diagnosis not present

## 2018-10-03 LAB — URINALYSIS, ROUTINE W REFLEX MICROSCOPIC
Bilirubin Urine: NEGATIVE
Glucose, UA: >=500 mg/dL — AB
Ketones, ur: NEGATIVE mg/dL
Nitrite: NEGATIVE
Protein, ur: 30 mg/dL — AB
Specific Gravity, Urine: 1.018 (ref 1.005–1.030)
Trans Epithel, UA: 1
WBC, UA: >50 WBC/hpf — ABNORMAL HIGH (ref 0–5)
pH: 5 (ref 5.0–8.0)

## 2018-10-03 MED ORDER — CEPHALEXIN 500 MG PO CAPS: 500.0000 mg | ORAL_CAPSULE | Freq: Four times a day (QID) | ORAL | 0 refills | Status: DC

## 2018-10-03 MED ORDER — PHENAZOPYRIDINE HCL 100 MG PO TABS: 100.0000 mg | ORAL_TABLET | Freq: Three times a day (TID) | ORAL | 0 refills | Status: DC | PRN

## 2018-10-03 MED ORDER — HYDROCODONE-ACETAMINOPHEN 5-325 MG PO TABS: 1.0000 | ORAL_TABLET | ORAL | 0 refills | Status: DC | PRN

## 2018-10-03 MED ORDER — TRAMADOL HCL 50 MG PO TABS
100.0000 mg | ORAL_TABLET | Freq: Once | ORAL | Status: AC
  Administered 2018-10-03: 100 mg via ORAL
  Filled 2018-10-03: qty 2

## 2018-10-03 MED ORDER — ONDANSETRON HCL 4 MG PO TABS
4.0000 mg | ORAL_TABLET | Freq: Once | ORAL | Status: AC
  Administered 2018-10-03: 4 mg via ORAL
  Filled 2018-10-03: qty 1

## 2018-10-03 MED ORDER — CEPHALEXIN 500 MG PO CAPS
500.0000 mg | ORAL_CAPSULE | Freq: Once | ORAL | Status: AC
  Administered 2018-10-03: 500 mg via ORAL
  Filled 2018-10-03: qty 1

## 2018-10-03 NOTE — Discharge Instructions (Addendum)
Your test suggest a urinary tract infection.  There was increased glucose being spilled in your urine.  Please monitor your diabetes closely.  Use Keflex with each meal and at bedtime or 4 times daily until all taken.  Use Pyridium 3 times daily with a meal.  Use Tylenol extra strength for mild pain, use Norco for more severe pain. This medication may cause drowsiness. Please do not drink, drive, or participate in activity that requires concentration while taking this medication.

## 2018-10-03 NOTE — ED Provider Notes (Signed)
[facility] EMERGENCY DEPARTMENT Provider Note   CSN: [id number] Arrival date & time: 10/03/18  1015    History   Chief Complaint Chief Complaint  Patient presents with  . Back Pain    HPI ANELIE BOWDISH is a 43 y.o. female.     The history is provided by the patient.  Back Pain  Location:  Lumbar spine Quality: spasm. Radiates to:  Does not radiate Pain severity:  Severe Pain is:  Same all the time Onset quality:  Gradual Duration:  2 days Timing:  Constant Progression:  Worsening Chronicity:  New Context: not falling, not lifting heavy objects and not MVA   Context comment:  Spasm pain with urination.  Relieved by:  Nothing Worsened by:  Urination Associated symptoms: no abdominal pain, no bladder incontinence, no bowel incontinence, no chest pain, no dysuria, no fever and no perianal numbness   Associated symptoms comment:  Burning and pressure with urination.   Past Medical History:  Diagnosis Date  . Anxiety   . Arthritis   . Chronic back pain   . Chronic bronchitis (HCC)   . Depression   . Diabetes mellitus    lost weight and is not on meds now.  . High cholesterol   . History of kidney stones   . Hypertension   . Migraine   . Neuropathic pain of foot   . Plantar fasciitis   . Sciatica   . Torn rotator cuff     Patient Active Problem List   Diagnosis Date Noted  . Complete tear of right rotator cuff   . Chronic pain of left knee   . Left shoulder pain 09/21/2015  . Disorders of bursae and tendons in shoulder region, unspecified 09/25/2013    Past Surgical History:  Procedure Laterality Date  . BACK SURGERY     herniated disc; had disc removed and then fusion; total of 3 surgeries  . EYE SURGERY Bilateral    removal of cyst and straightening of muscles  . KNEE ARTHROSCOPY WITH MEDIAL MENISECTOMY Left 12/27/2016   Procedure: LEFT KNEE DIAGNOSTIC ARTHROSCOPY;  Surgeon: Vickki Hearing, MD;  Location: [facility];  Service: Orthopedics;   Laterality: Left;  . SHOULDER OPEN ROTATOR CUFF REPAIR Right 09/19/2018   Procedure: ROTATOR CUFF REPAIR SHOULDER OPEN;  Surgeon: Vickki Hearing, MD;  Location: [facility];  Service: Orthopedics;  Laterality: Right;  . TUBAL LIGATION       OB History    Gravida  3   Para  3   Term  3   Preterm      AB      Living  3     SAB      TAB      Ectopic      Multiple      Live Births               Home Medications    Prior to Admission medications   Medication Sig Start Date End Date Taking? Authorizing Provider  cyclobenzaprine (FLEXERIL) 10 MG tablet Take 1 tablet (10 mg total) by mouth 3 (three) times daily as needed for muscle spasms. 09/19/18   Vickki Hearing, MD  HYDROcodone-acetaminophen [facility]) 10-325 MG tablet Take 1 tablet by mouth every 4 (four) hours as needed. 09/25/18   Vickki Hearing, MD  promethazine (PHENERGAN) 12.5 MG tablet Take 1 tablet (12.5 mg total) by mouth every 6 (six) hours as needed for nausea or vomiting. 09/19/18  Vickki Hearing, MD    Family History Family History  Problem Relation Age of Onset  . Heart failure Mother   . COPD Mother   . Diabetes Mother   . Heart failure Father   . Diabetes Other     Social History Social History   Tobacco Use  . Smoking status: Current Every Day Smoker    Packs/day: 0.50    Years: 20.00    Pack years: 10.00    Types: Cigarettes  . Smokeless tobacco: Never Used  Substance Use Topics  . Alcohol use: No  . Drug use: No     Allergies   Codeine; Benadryl [diphenhydramine hcl]; and Compazine   Review of Systems Review of Systems  Constitutional: Negative for activity change and fever.       All ROS Neg except as noted in HPI  HENT: Negative for nosebleeds.   Eyes: Negative for photophobia and discharge.  Respiratory: Negative for cough, shortness of breath and wheezing.   Cardiovascular: Negative for chest pain and palpitations.  Gastrointestinal: Negative for abdominal  pain, blood in stool and bowel incontinence.  Genitourinary: Negative for bladder incontinence, dysuria, frequency and hematuria.  Musculoskeletal: Positive for back pain. Negative for arthralgias and neck pain.  Skin: Negative.   Neurological: Negative for dizziness, seizures and speech difficulty.  Psychiatric/Behavioral: Negative for confusion and hallucinations.     Physical Exam Updated Vital Signs BP (!) 143/96 (BP Location: Right Arm)   Pulse (!) 113   Temp 98.9 F (37.2 C) (Oral)   Resp 20   Ht  (1.727 m)   Wt 108.9 kg   LMP 09/16/2018   SpO2 99%   BMI 36.49 kg/m   Physical Exam Vitals signs and nursing note reviewed.  Constitutional:      Appearance: She is well-developed. She is not toxic-appearing.  HENT:     Head: Normocephalic.     Right Ear: Tympanic membrane and external ear normal.     Left Ear: Tympanic membrane and external ear normal.  Eyes:     General: Lids are normal.     Pupils: Pupils are equal, round, and reactive to light.  Neck:     Musculoskeletal: Normal range of motion and neck supple.     Vascular: No carotid bruit.  Cardiovascular:     Rate and Rhythm: Normal rate and regular rhythm.     Pulses: Normal pulses.     Heart sounds: Normal heart sounds.  Pulmonary:     Effort: No respiratory distress.     Breath sounds: Normal breath sounds.  Abdominal:     General: Bowel sounds are normal.     Palpations: Abdomen is soft.     Tenderness: There is no abdominal tenderness. There is no guarding.  Musculoskeletal: Normal range of motion.     Lumbar back: She exhibits pain and spasm.       Back:  Lymphadenopathy:     Head:     Right side of head: No submandibular adenopathy.     Left side of head: No submandibular adenopathy.     Cervical: No cervical adenopathy.  Skin:    General: Skin is warm and dry.  Neurological:     Mental Status: She is alert and oriented to person, place, and time.     Cranial Nerves: No cranial nerve  deficit.     Sensory: No sensory deficit.  Psychiatric:        Speech: Speech normal.  ED Treatments / Results  Labs (all labs ordered are listed, but only abnormal results are displayed) Labs Reviewed  URINALYSIS, ROUTINE W REFLEX MICROSCOPIC - Abnormal; Notable for the following components:      Result Value   APPearance CLOUDY (*)    Glucose, UA >=500 (*)    Hgb urine dipstick SMALL (*)    Protein, ur 30 (*)    Leukocytes,Ua LARGE (*)    WBC, UA >50 (*)    Bacteria, UA RARE (*)    All other components within normal limits  URINE CULTURE    EKG None  Radiology No results found.  Procedures Procedures (including critical care time)  Medications Ordered in ED Medications - No data to display   Initial Impression / Assessment and Plan / ED Course  I have reviewed the triage vital signs and the nursing notes.  Pertinent labs & imaging results that were available during my care of the patient were reviewed by me and considered in my medical decision making (see chart for details).          Final Clinical Impressions(s) / ED Diagnoses MDM  Pulse rate was elevated at 113 on admission to the emergency department.  Blood pressure is also elevated.  Patient complained of right lower back pain.   No high fevers reported.  No vomiting, and no unusual sweats.  Urine analysis shows a cloudy yellow specimen specimen with greater than 500 mg of glucose greater than 30 mg of protein large leukocyte esterase and greater than 50 WBCs.  Consistent with urinary tract infection.  We will send the urine down to the lab for culture.  Patient also noted to have budding yeast present in the urine.  No recent injury or trauma.      I have informed the patient of the findings.  I have informed the patient of the culture being sent.  I have asked the patient to watch for worsening of symptoms in the event that this is early pyelonephritis.  Patient will be treated with Keflex.   Prescription for Pyridium and Norco also ordered for the patient.  Patient is to follow-up with the primary physician in 5 to 7 days to evaluate for resolution of this issue.   Final diagnoses:  Urinary tract infection without hematuria, site unspecified    ED Discharge Orders         Ordered    cephALEXin (KEFLEX) 500 MG capsule  4 times daily     10/03/18 1241    phenazopyridine (PYRIDIUM) 100 MG tablet  3 times daily PRN     10/03/18 1241    HYDROcodone-acetaminophen (NORCO/VICODIN) 5-325 MG tablet  Every 4 hours PRN,   Status:  Discontinued     10/03/18 1241           Ivery Quale, PA-C 10/04/18 2103    Raeford Razor, MD 10/11/18 1212

## 2018-10-03 NOTE — ED Triage Notes (Signed)
Pt c/o right sided lower back pain since yesterday, pt reports she feels as if she is having muscle spasms with urination, denies injury

## 2018-10-04 ENCOUNTER — Ambulatory Visit (INDEPENDENT_AMBULATORY_CARE_PROVIDER_SITE_OTHER): Payer: BLUE CROSS/BLUE SHIELD | Admitting: Orthopedic Surgery

## 2018-10-04 ENCOUNTER — Encounter: Payer: Self-pay | Admitting: Orthopedic Surgery

## 2018-10-04 VITALS — BP 131/81 | HR 104 | Ht 68.0 in | Wt 235.0 lb

## 2018-10-04 DIAGNOSIS — Z9889 Other specified postprocedural states: Secondary | ICD-10-CM | POA: Insufficient documentation

## 2018-10-04 MED ORDER — HYDROCODONE-ACETAMINOPHEN 10-325 MG PO TABS: 1.0000 | ORAL_TABLET | ORAL | 0 refills | Status: DC | PRN

## 2018-10-04 NOTE — Patient Instructions (Signed)
Shower ok  Continue hydrocodone 10 q 4   Start OT /PT

## 2018-10-04 NOTE — Progress Notes (Signed)
Chief Complaint  Patient presents with  . Routine Post Op    Rt shoulder DOS 09/19/18   This is postop day 15  She is doing well.  Her wound was checked it was clean the sutures were removed from the ends of the wound  She can start therapy  09/19/2018  8:50 AM  PATIENT:  Tina Pham  43 y.o. female  PRE-OPERATIVE DIAGNOSIS:  right rotator cuff tear  POST-OPERATIVE DIAGNOSIS:  right rotator cuff tear  PROCEDURE:  Procedure(s): ROTATOR CUFF REPAIR SHOULDER OPEN (Right)-23410  Small rotator cuff tear full-thickness 0.8 mm width no retraction  Speed screw 5.5 anchor sURGEON:  Surgeon(s) and Role:    Vickki Hearing, MD - Primary   Meds ordered this encounter  Medications  . DISCONTD: HYDROcodone-acetaminophen (NORCO) 10-325 MG tablet    Sig: Take 1 tablet by mouth every 4 (four) hours as needed.    Dispense:  42 tablet    Refill:  0  . HYDROcodone-acetaminophen (NORCO) 10-325 MG tablet    Sig: Take 1 tablet by mouth every 4 (four) hours as needed.    Dispense:  42 tablet    Refill:  0    start OT/PT  FU 4 WEEKS

## 2018-10-05 LAB — URINE CULTURE
Culture: >=100000 — AB
Special Requests: NORMAL

## 2018-10-06 ENCOUNTER — Telehealth: Payer: Self-pay | Admitting: Emergency Medicine

## 2018-10-06 NOTE — Telephone Encounter (Signed)
Post ED Visit - Positive Culture Follow-up  Culture report reviewed by antimicrobial stewardship pharmacist: [facility] []  Enzo Bi, Pharm.D. []  Celedonio Miyamoto, Pharm.D., BCPS AQ-ID []  Garvin Fila, Pharm.D., BCPS []  Georgina Pillion, Pharm.D., BCPS []  [place], [place].D., BCPS, AAHIVP []  Estella Husk, Pharm.D., BCPS, AAHIVP []  Lysle Pearl, PharmD, BCPS []  Phillips Climes, PharmD, BCPS []  Agapito Games, PharmD, BCPS []  Verlan Friends, PharmD [x]  Mervyn Gay, PharmD, BCPS []  Vinnie Level, PharmD  [facility] []  Len Childs, PharmD []  Greer Pickerel, PharmD []  Adalberto Cole, PharmD []  Perlie Gold, Rph []  Lonell Face) Jean Rosenthal, PharmD []  Earl Many, PharmD []  Junita Push, PharmD []  Dorna Leitz, PharmD []  Terrilee Files, PharmD []  Lynann Beaver, PharmD []  Keturah Barre, PharmD []  Loralee Pacas, PharmD []  Bernadene Person, PharmD   Positive urine culture Treated with Cephalexin, organism sensitive to the same and no further patient follow-up is required at this time.  Carollee Herter Devanta Daniel 10/06/2018, 11:28 AM

## 2018-10-09 ENCOUNTER — Other Ambulatory Visit: Payer: Self-pay

## 2018-10-09 ENCOUNTER — Other Ambulatory Visit: Payer: Self-pay | Admitting: Orthopedic Surgery

## 2018-10-09 DIAGNOSIS — Z9889 Other specified postprocedural states: Secondary | ICD-10-CM

## 2018-10-09 MED ORDER — HYDROCODONE-ACETAMINOPHEN 7.5-325 MG PO TABS: 1.0000 | ORAL_TABLET | Freq: Three times a day (TID) | ORAL | 0 refills | Status: DC | PRN

## 2018-10-09 MED ORDER — CYCLOBENZAPRINE HCL 10 MG PO TABS: 10.0000 mg | ORAL_TABLET | Freq: Three times a day (TID) | ORAL | 0 refills | Status: DC | PRN

## 2018-10-11 ENCOUNTER — Telehealth (HOSPITAL_COMMUNITY): Payer: Self-pay | Admitting: Occupational Therapy

## 2018-10-11 NOTE — Telephone Encounter (Signed)
Called and left message for pt regarding 2 week clinic closure for COVID-19 precautions. Asked pt to return call to reschedule evaluation and discuss HEP to complete in the meantime.   Ezra Sites, OTR/L  [phone] 10/11/2018

## 2018-10-15 ENCOUNTER — Other Ambulatory Visit: Payer: Self-pay

## 2018-10-16 MED ORDER — HYDROCODONE-ACETAMINOPHEN 7.5-325 MG PO TABS: 1.0000 | ORAL_TABLET | Freq: Three times a day (TID) | ORAL | 0 refills | Status: DC | PRN

## 2018-10-18 ENCOUNTER — Telehealth (HOSPITAL_COMMUNITY): Payer: Self-pay | Admitting: Occupational Therapy

## 2018-10-18 ENCOUNTER — Encounter (HOSPITAL_COMMUNITY): Payer: Self-pay | Admitting: Occupational Therapy

## 2018-10-18 NOTE — Telephone Encounter (Signed)
Called and spoke with pt regarding recent sx and current functioning. Pt reports MD told her to use her arm only as needed below shoulder level. She is have some soreness, did feel a pop on Wednesday when reaching during toilet hygiene task with increased pain for 2 days however is feeling improved today. Discussed current clinic closure and plan to reopen on 4/6, informed this could change. Discussed table slide HEP with pt and will mail to pt as she is unable to access email at this time.    *Of note: pt's voicemail is not set up and she is unable to access messagesEzra Sites, OTR/L  [phone] 10/18/2018

## 2018-10-18 NOTE — Therapy (Signed)
[facility] [address] [place], [place], [address] Phone: [phone]   Fax:  [phone]  Patient Details  Name: Tina Pham MRN: [id number] Date of Birth: [birth year] Referring Provider:  No ref. provider found  Encounter Date: 10/18/2018    Mailed pt HEP including pendulums and table slides (flexion and abduction)   Ezra Sites, OTR/L  [phone] 10/18/2018, 8:55 AM  [facility] [address] [place], [place], [address] Phone: [phone]   Fax:  [phone]

## 2018-10-22 ENCOUNTER — Ambulatory Visit (HOSPITAL_COMMUNITY): Payer: BLUE CROSS/BLUE SHIELD | Admitting: Occupational Therapy

## 2018-10-23 ENCOUNTER — Other Ambulatory Visit: Payer: Self-pay

## 2018-10-23 MED ORDER — HYDROCODONE-ACETAMINOPHEN 7.5-325 MG PO TABS: 1.0000 | ORAL_TABLET | Freq: Three times a day (TID) | ORAL | 0 refills | Status: AC | PRN

## 2018-10-24 ENCOUNTER — Telehealth (HOSPITAL_COMMUNITY): Payer: Self-pay | Admitting: Occupational Therapy

## 2018-10-24 NOTE — Telephone Encounter (Signed)
Patient was contacted today regarding the temporary reduction of OP rehab services due to concerns for community transmission of Covid-19.   Discussed shoulder functioning with pt-recent sx so functioning is limited. Just received HEP today. Will schedule for evaluation and determine if in-person visits are necessary or if HEP/telehealth will be appropriate.    Ezra Sites, OTR/L  [phone] 10/24/2018

## 2018-10-30 ENCOUNTER — Ambulatory Visit (HOSPITAL_COMMUNITY): Payer: BLUE CROSS/BLUE SHIELD | Admitting: Occupational Therapy

## 2018-10-30 ENCOUNTER — Other Ambulatory Visit: Payer: Self-pay

## 2018-10-30 ENCOUNTER — Other Ambulatory Visit: Payer: Self-pay | Admitting: Orthopedic Surgery

## 2018-10-30 MED ORDER — HYDROCODONE-ACETAMINOPHEN 5-325 MG PO TABS: 1.0000 | ORAL_TABLET | Freq: Four times a day (QID) | ORAL | 0 refills | Status: DC | PRN

## 2018-10-30 NOTE — Progress Notes (Signed)
meds decrease dose

## 2018-10-31 ENCOUNTER — Ambulatory Visit (HOSPITAL_COMMUNITY): Payer: BLUE CROSS/BLUE SHIELD | Admitting: Occupational Therapy

## 2018-11-04 ENCOUNTER — Encounter: Payer: Self-pay | Admitting: Orthopedic Surgery

## 2018-11-04 ENCOUNTER — Other Ambulatory Visit: Payer: Self-pay

## 2018-11-04 ENCOUNTER — Ambulatory Visit (INDEPENDENT_AMBULATORY_CARE_PROVIDER_SITE_OTHER): Payer: BLUE CROSS/BLUE SHIELD | Admitting: Orthopedic Surgery

## 2018-11-04 VITALS — Ht 68.0 in | Wt 231.0 lb

## 2018-11-04 DIAGNOSIS — Z9889 Other specified postprocedural states: Secondary | ICD-10-CM

## 2018-11-04 NOTE — Progress Notes (Signed)
POST OP APPT.  Chief Complaint  Patient presents with  . Routine Post Op    09/19/18 right shoulder RCR    Encounter Diagnosis  Name Primary?  . S/P right rotator cuff repair open 09/19/18 Yes    POD # 46 week  6 day 4    PAIN MEDS NORCO 5 MG Q 6   THERAPY: No therapy to date starts tomorrow (COVID-19 prevented therapy)  ROM  Passive range of motion abduction 100 flexion 110 external rotation 45  PLAN FU IN 6 weeks

## 2018-11-06 ENCOUNTER — Encounter (HOSPITAL_COMMUNITY): Payer: Self-pay

## 2018-11-06 ENCOUNTER — Other Ambulatory Visit: Payer: Self-pay

## 2018-11-06 ENCOUNTER — Ambulatory Visit (HOSPITAL_COMMUNITY): Payer: BLUE CROSS/BLUE SHIELD | Attending: Orthopedic Surgery

## 2018-11-06 DIAGNOSIS — M25611 Stiffness of right shoulder, not elsewhere classified: Secondary | ICD-10-CM | POA: Diagnosis present

## 2018-11-06 DIAGNOSIS — R29898 Other symptoms and signs involving the musculoskeletal system: Secondary | ICD-10-CM | POA: Diagnosis not present

## 2018-11-06 DIAGNOSIS — M25511 Pain in right shoulder: Secondary | ICD-10-CM

## 2018-11-06 MED ORDER — HYDROCODONE-ACETAMINOPHEN 5-325 MG PO TABS: 1.0000 | ORAL_TABLET | Freq: Four times a day (QID) | ORAL | 0 refills | Status: DC | PRN

## 2018-11-06 NOTE — Patient Instructions (Signed)
Lacrosse Ball UFT  Place the lacrosse ball over your shoulder. Lean into a doorframe or wall to press the ball into your Upper trapezius or Supraspinatus muscle knots.   Do not press past 5/10 pain. Hold trigger points for 30sec maximum. Be sure breath regularly throughout.  Spend about 5-10 minutes on this.      Scalene Stretch, Sitting  Repeat __10_ times per session. Do _1-2__ sessions per day.  Copyright  VHI. All rights reserved.  Scalene Stretch, Sitting   Sit, one hand tucked under hip on side to be stretched, other hand over top of head. Gently pull head to side and backwards. Hold _20-30__ seconds. For more stretch, lean body in direction of head pull. Repeat _2__ times per session. Do _2-3_ sessions per day.  Copyright  VHI. All rights reserved.        Repeat all exercises 10-15 times, 1-2 times per day.  1) Shoulder Protraction    Begin with elbows by your side, slowly "punch" straight out in front of you.      2) Shoulder Flexion  Standing:         Begin with arms at your side with thumbs pointed up, slowly raise both arms up and forward towards overhead.               3) Horizontal abduction/adduction   Standing:           Begin with arms straight out in front of you, bring out to the side in at "T" shape. Keep arms straight entire time.      4) Internal & External Rotation    *No band* -Stand with elbows at the side and elbows bent 90 degrees. Move your forearms away from your body, then bring back inward toward the body.     5) Shoulder Abduction  Standing:       Begin with your arms flat on the table next to your side. Slowly move your arms out to the side so that they go overhead, in a jumping jack or snow angel movement.

## 2018-11-06 NOTE — Therapy (Signed)
[facility] [address] [place], [place], [address] Phone: [phone]   Fax:  [phone]  Occupational Therapy Evaluation  Patient Details  Name: Tina Pham MRN: [id number] Date of Birth: April 11, 1976 Referring Provider (OT): Fuller Canada, MD   Encounter Date: 11/06/2018  OT End of Session - 11/06/18 1305    Visit Number  1    Number of Visits  8    Date for OT Re-Evaluation  12/04/18    Authorization Type  [employer] 30 visits with 0 used    Authorization - Visit Number  1    Authorization - Number of Visits  30    OT Start Time  [phone]    OT Stop Time  1025    OT Time Calculation (min)  32 min    Activity Tolerance  Patient tolerated treatment well    Behavior During Therapy  [facility] for tasks assessed/performed       Past Medical History:  Diagnosis Date  . Anxiety   . Arthritis   . Chronic back pain   . Chronic bronchitis (HCC)   . Depression   . Diabetes mellitus    lost weight and is not on meds now.  . High cholesterol   . History of kidney stones   . Hypertension   . Migraine   . Neuropathic pain of foot   . Plantar fasciitis   . Sciatica   . Torn rotator cuff     Past Surgical History:  Procedure Laterality Date  . BACK SURGERY     herniated disc; had disc removed and then fusion; total of 3 surgeries  . EYE SURGERY Bilateral    removal of cyst and straightening of muscles  . KNEE ARTHROSCOPY WITH MEDIAL MENISECTOMY Left 12/27/2016   Procedure: LEFT KNEE DIAGNOSTIC ARTHROSCOPY;  Surgeon: Vickki Hearing, MD;  Location: [facility];  Service: Orthopedics;  Laterality: Left;  . SHOULDER OPEN ROTATOR CUFF REPAIR Right 09/19/2018   Procedure: ROTATOR CUFF REPAIR SHOULDER OPEN;  Surgeon: Vickki Hearing, MD;  Location: [facility];  Service: Orthopedics;  Laterality: Right;  . TUBAL LIGATION      There were no vitals filed for this visit.  Subjective Assessment - 11/06/18 0957    Subjective   S: I wasn't able  to move it this much before the surgery.    Pertinent History  Patient is a 43 y/o female S/P right RTC which was completed on 09/19/18 by Dr. Romeo Apple. patient was unable to start therapy when initially ready due to COVID-19 affecting in person visits. Patient was referred to occupational therapy for evaluation and treatment.     Special Tests  FOTO: to be completed later. Survey was e-mailed to patient.     Patient Stated Goals  To gain back the strength and use of RUE.     Currently in Pain?  Yes    Pain Score  1     Pain Location  Shoulder    Pain Orientation  Right    Pain Descriptors / Indicators  Aching;Sore    Pain Type  Acute pain    Pain Radiating Towards  goes up into neck    Pain Onset  More than a month ago    Pain Frequency  Constant    Aggravating Factors   first thing in the morning after waking, reaching, increased use    Pain Relieving Factors  over the counter medication for pain.    Effect of  Pain on Daily Activities  minimal effect    Multiple Pain Sites  No        [facility] OT Assessment - 11/06/18 1000      Assessment   Medical Diagnosis  right RTR    Referring Provider (OT)  Fuller Canada, MD    Onset Date/Surgical Date  09/19/18    Hand Dominance  Right    Next MD Visit  12/18/18    Prior Therapy  None for this condition      Precautions   Precautions  Shoulder    Type of Shoulder Precautions  No lifting anything heavy especially overhead.       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       ADL   ADL comments  Patient is having difficulty using her right arm as her dominant extremity.  Difficulty raising it out away from her, opening doors, and turning faucets.       Mobility   Mobility Status  Independent      Written Expression   Dominant Hand  Right      Vision - History   Baseline Vision  Wears glasses all the time      Cognition   Overall Cognitive Status  Within Functional Limits for tasks assessed      Observation/Other Assessments   Focus on Therapeutic Outcomes (FOTO)   E-mailed to patient for completion.       ROM / Strength   AROM / PROM / Strength  AROM;PROM;Strength      Palpation   Palpation comment  Moderate fascial restrictions in right anterior shoulder and upper trapezius proximal to the lateral aspect of neck.       AROM   Overall AROM Comments  Assessed seated. IR/er adducted    AROM Assessment Site  Shoulder    Right/Left Shoulder  Right    Right Shoulder Flexion  134 Degrees    Right Shoulder ABduction  150 Degrees    Right Shoulder Internal Rotation  90 Degrees    Right Shoulder External Rotation  90 Degrees      PROM   Overall PROM Comments  Assessed supine. IR/er adducted.     PROM Assessment Site  Shoulder    Right/Left Shoulder  Right    Right Shoulder Flexion  180 Degrees    Right Shoulder ABduction  180 Degrees    Right Shoulder Internal Rotation  90 Degrees    Right Shoulder External Rotation  90 Degrees      Strength   Overall Strength Comments  Assessed seated. IR/er adducted    Strength Assessment Site  Shoulder    Right/Left Shoulder  Right    Right Shoulder Flexion  3+/5    Right Shoulder ABduction  3+/5    Right Shoulder Internal Rotation  5/5    Right Shoulder External Rotation  5/5                      OT Education - 11/06/18 1304  Education Details  Pt may stop previous HEP which was e-mailed to her. Pt was provided with cerval stretch, self trigger point release with ball, and A/ROM shoulder exercises.     Person(s) Educated  Patient    Methods  Explanation;Demonstration;Verbal  cues;Handout    Comprehension  Verbalized understanding;Verbal cues required;Returned demonstration       OT Short Term Goals - 11/06/18 1312      OT SHORT TERM GOAL #1   Title  Patient will be educated and independent with HEP to faciliate progress in therapy and allow her to return to using her RUE as her dominant extremity for all daily and work activities.     Time  4    Period  Weeks    Status  New    Target Date  12/04/18      OT SHORT TERM GOAL #2   Title  Patient will increase A/ROM of her RUE to WNL in order to complete overhead reaching activities with less difficulty.     Time  4    Period  Weeks    Status  New      OT SHORT TERM GOAL #3   Title  Patient will increase RUE strength to 4+/5 in order to return to using her RUE for all work tasks and normal daily lifting activities.     Time  4    Period  Weeks    Status  New      OT SHORT TERM GOAL #4   Title  Patient will decrease pain level to 2/10 or less in order to complete daily and work related tasks with increased comfort and need for pain management techniques.     Time  4    Period  Weeks    Status  New      OT SHORT TERM GOAL #5   Title  Patient will be educated on self myofascial release techniques in order to decrease her fascial restrictions in the RUE while demonstrating increased ROM and reports of decreased pain during tasks.     Time  4    Period  Weeks    Status  New               Plan - 11/06/18 1308    Clinical Impression Statement  A: Patient is a 43 y/o female S/P right RTC repair causing increased pain, fascial restrictions, and decreased strength and ROM resulting in difficulty completing daily tasks using her RUE as her dominant extremity. Patient has been cleared by Dr. Romeo Apple to return to work although has not been able to get in touch with her supervisor.    OT Occupational Profile and History  Problem Focused Assessment - Including review of records relating to presenting  problem    Occupational performance deficits (Please refer to evaluation for details):  ADL's;IADL's;Rest and Sleep;Work    Games developer / Function / Physical Skills  ADL;Strength;Pain;UE functional use;ROM;IADL;Fascial restriction;Mobility    Rehab Potential  Excellent    Clinical Decision Making  Limited treatment options, no task modification necessary    Comorbidities Affecting Occupational Performance:  None    Modification or Assistance to Complete Evaluation   No modification of tasks or assist necessary to complete eval    OT Frequency  2x / week    OT Duration  4 weeks    OT Treatment/Interventions  Self-care/ADL training;Moist Heat;DME and/or AE instruction;Therapeutic activities;Therapeutic exercise;Ultrasound;Patient/family education;Manual Therapy;Electrical Stimulation;Cryotherapy;Neuromuscular education;Passive range of motion    Plan  P: Patient will benefit from skilled OT services to increase functional performance during ADL tasks while using her RUE as her dominant extremity. Plan: Patient is appropriate for telehealth visits and is in agreement to participate. Patient will benefit ftom myofascial release (education only at this time), stretching, A/ROM, and general shoulder and scapular strengthening.     Consulted and Agree with Plan of Care  Patient       Patient will benefit from skilled therapeutic intervention in order to improve the following deficits and impairments:  Body Structure / Function / Physical Skills  Visit Diagnosis: Other symptoms and signs involving the musculoskeletal system  Acute pain of right shoulder  Stiffness of right shoulder, not elsewhere classified    Problem List Patient Active Problem List   Diagnosis Date Noted  . S/P right rotator cuff repair open 09/19/18 10/04/2018  . Complete tear of right rotator cuff   . Chronic pain of left knee   . Left shoulder pain 09/21/2015  . Disorders of bursae and tendons in shoulder region,  unspecified 09/25/2013   Limmie PatriciaLaura Johnta Couts, OTR/L,CBIS  [phone]  11/06/2018, 1:16 PM  [facility] [address] [place], [place], [address] Phone: [phone]   Fax:  [phone]  Name: Suzette BattiestLaura W Pote MRN: [id number] Date of Birth: 09/02/1975

## 2018-11-07 ENCOUNTER — Telehealth (HOSPITAL_COMMUNITY): Payer: Self-pay | Admitting: General Practice

## 2018-11-07 ENCOUNTER — Ambulatory Visit (HOSPITAL_COMMUNITY): Payer: BLUE CROSS/BLUE SHIELD | Admitting: Occupational Therapy

## 2018-11-07 ENCOUNTER — Telehealth (HOSPITAL_COMMUNITY): Payer: Self-pay | Admitting: Occupational Therapy

## 2018-11-07 NOTE — Telephone Encounter (Signed)
Woke patient up and she states she has not set anything up to get video visit. She requested we start next week. Pt confrimed she will be ready for Monday at 2:15 for video visit. NF 11/07/2018

## 2018-11-07 NOTE — Telephone Encounter (Signed)
11/07/18  Called to verify insurance verification for Telehealth visits - DUE TO RECENT UPDATES - NO TELEHEALTH COVERAGE FOR FACILITY CHARGES - only for professional charges

## 2018-11-07 NOTE — Telephone Encounter (Signed)
Called pt to connect to Telehealth l/m for pt to call us back and I will call again in a few mins. NF4/16/2020

## 2018-11-07 NOTE — Telephone Encounter (Signed)
.  Rehabcovidschedule for 11/07/2018

## 2018-11-07 NOTE — Telephone Encounter (Signed)
L/m urgent message for pt to call us back for her Telehealth visit to start before 9am.

## 2018-11-11 ENCOUNTER — Ambulatory Visit (HOSPITAL_COMMUNITY): Payer: BLUE CROSS/BLUE SHIELD

## 2018-11-12 ENCOUNTER — Ambulatory Visit (HOSPITAL_COMMUNITY): Payer: BLUE CROSS/BLUE SHIELD | Admitting: Occupational Therapy

## 2018-11-12 ENCOUNTER — Encounter (HOSPITAL_COMMUNITY): Payer: Self-pay | Admitting: Occupational Therapy

## 2018-11-12 ENCOUNTER — Other Ambulatory Visit: Payer: Self-pay

## 2018-11-12 DIAGNOSIS — M25511 Pain in right shoulder: Secondary | ICD-10-CM

## 2018-11-12 DIAGNOSIS — R29898 Other symptoms and signs involving the musculoskeletal system: Secondary | ICD-10-CM | POA: Diagnosis not present

## 2018-11-12 DIAGNOSIS — M25611 Stiffness of right shoulder, not elsewhere classified: Secondary | ICD-10-CM

## 2018-11-12 NOTE — Therapy (Signed)
[place], [place], [address] Phone: [phone]   Fax:  [phone]  Occupational Therapy Treatment  Patient Details  Name: Tina Pham MRN: [id number] Date of Birth: 14-May-1976 Referring Provider (OT): Arther Abbott, MD   Encounter Date: 11/12/2018  OT End of Session - 11/12/18 1202    Visit Number  2    Number of Visits  8    Date for OT Re-Evaluation  12/04/18    Authorization Type  [employer] 30 visits with 0 used    Authorization - Visit Number  2    Authorization - Number of Visits  30    OT Start Time  3546   pt arrived late   OT Stop Time  1200    OT Time Calculation (min)  36 min    Activity Tolerance  Patient tolerated treatment well    Behavior During Therapy  [facility] for tasks assessed/performed       Past Medical History:  Diagnosis Date  . Anxiety   . Arthritis   . Chronic back pain   . Chronic bronchitis ([place])   . Depression   . Diabetes mellitus    lost weight and is not on meds now.  . High cholesterol   . History of kidney stones   . Hypertension   . Migraine   . Neuropathic pain of foot   . Plantar fasciitis   . Sciatica   . Torn rotator cuff     Past Surgical History:  Procedure Laterality Date  . BACK SURGERY     herniated disc; had disc removed and then fusion; total of 3 surgeries  . EYE SURGERY Bilateral    removal of cyst and straightening of muscles  . KNEE ARTHROSCOPY WITH MEDIAL MENISECTOMY Left 12/27/2016   Procedure: LEFT KNEE DIAGNOSTIC ARTHROSCOPY;  Surgeon: Carole Civil, MD;  Location: [facility];  Service: Orthopedics;  Laterality: Left;  . SHOULDER OPEN ROTATOR CUFF REPAIR Right 09/19/2018   Procedure: ROTATOR CUFF REPAIR SHOULDER OPEN;  Surgeon: Carole Civil, MD;  Location: [facility];  Service: Orthopedics;  Laterality: Right;  . TUBAL LIGATION      There were no vitals filed for this visit.  Subjective Assessment - 11/12/18 1126    Subjective    S: It's a little sore today.     Currently in Pain?  Yes    Pain Score  2     Pain Location  Shoulder    Pain Orientation  Right    Pain Descriptors / Indicators  Aching;Sore    Pain Type  Acute pain    Pain Radiating Towards  goes up to neck    Pain Onset  More than a month ago    Pain Frequency  Constant    Aggravating Factors   stiff when waking up, increased use    Pain Relieving Factors  over the counter pain medication    Effect of Pain on Daily Activities  minimal effect    Multiple Pain Sites  No         [facility] OT Assessment - 11/12/18 1125      Assessment   Medical Diagnosis  right RCR      Precautions   Precautions  Shoulder    Type of Shoulder Precautions  No lifting anything heavy especially overhead.                OT Treatments/Exercises (OP) - 11/12/18 1126  Exercises   Exercises  Shoulder      Shoulder Exercises: Supine   Protraction  PROM;5 reps;AROM;12 reps    Horizontal ABduction  PROM;5 reps;AROM;12 reps    External Rotation  PROM;5 reps;AROM;12 reps    Internal Rotation  PROM;5 reps;AROM;12 reps    Flexion  PROM;5 reps;AROM;12 reps    ABduction  PROM;5 reps;AROM;12 reps      Shoulder Exercises: Standing   Protraction  AROM;12 reps    Horizontal ABduction  AROM;12 reps    External Rotation  AROM;12 reps    Internal Rotation  AROM;12 reps    Flexion  AROM;12 reps    ABduction  AROM;12 reps    Extension  Theraband;10 reps    Theraband Level (Shoulder Extension)  Level 2 (Red)    Row  Theraband;10 reps    Theraband Level (Shoulder Row)  Level 2 (Red)    Retraction  Theraband;10 reps    Theraband Level (Shoulder Retraction)  Level 2 (Red)      Shoulder Exercises: ROM/Strengthening   Wall Wash  1'    X to V Arms  10X    Proximal Shoulder Strengthening, Supine  10X each, no rest breaks    Proximal Shoulder Strengthening, Seated  10X each, no rest breaks    Prot/Ret//Elev/Dep  1'      Manual Therapy   Manual Therapy  Myofascial  release    Manual therapy comments  completed separately from therapeutic exercises    Myofascial Release  myofascial release to right upper arm, trapezius, and scapularis regions to decrease pain and fascial restrictions and increase joint ROM             OT Education - 11/12/18 1207    Education Details  educated on various massagers for home use    Person(s) Educated  Patient    Methods  Explanation;Demonstration;Verbal cues;Handout    Comprehension  Verbalized understanding;Verbal cues required;Returned demonstration       OT Short Term Goals - 11/12/18 1145      OT SHORT TERM GOAL #1   Title  Patient will be educated and independent with HEP to faciliate progress in therapy and allow her to return to using her RUE as her dominant extremity for all daily and work activities.     Time  4    Period  Weeks    Status  On-going    Target Date  12/04/18      OT SHORT TERM GOAL #2   Title  Patient will increase A/ROM of her RUE to WNL in order to complete overhead reaching activities with less difficulty.     Time  4    Period  Weeks    Status  On-going      OT SHORT TERM GOAL #3   Title  Patient will increase RUE strength to 4+/5 in order to return to using her RUE for all work tasks and normal daily lifting activities.     Time  4    Period  Weeks    Status  On-going      OT SHORT TERM GOAL #4   Title  Patient will decrease pain level to 2/10 or less in order to complete daily and work related tasks with increased comfort and need for pain management techniques.     Time  4    Period  Weeks    Status  On-going      OT SHORT TERM GOAL #5   Title  Patient will be educated on self myofascial release techniques in order to decrease her fascial restrictions in the RUE while demonstrating increased ROM and reports of decreased pain during tasks.     Time  4    Period  Weeks    Status  On-going               Plan - 11/12/18 1202    Clinical Impression Statement   A: Initiated myofascial release and manual techniques this session to address fascial restrictions along upper arm and muscle knots at trapezius and scapular regions. Pt completing A/ROM today, proximal shoulder strengthening, and scapular theraband. Pt with good ROM, min/mod fatigue during session. Verbal cuing for form and technique.     Body Structure / Function / Physical Skills  ADL;Strength;Pain;UE functional use;ROM;IADL;Fascial restriction;Mobility    Plan  P: Continue with manual techniques, Add w arms       Patient will benefit from skilled therapeutic intervention in order to improve the following deficits and impairments:  Body Structure / Function / Physical Skills  Visit Diagnosis: Other symptoms and signs involving the musculoskeletal system  Acute pain of right shoulder  Stiffness of right shoulder, not elsewhere classified    Problem List Patient Active Problem List   Diagnosis Date Noted  . S/P right rotator cuff repair open 09/19/18 10/04/2018  . Complete tear of right rotator cuff   . Chronic pain of left knee   . Left shoulder pain 09/21/2015  . Disorders of bursae and tendons in shoulder region, unspecified 09/25/2013   Guadelupe Sabin, OTR/L  [phone] 11/12/2018, 12:07 PM  [place] [address] [place], [place], [address] Phone: [phone]   Fax:  [phone]  Name: Tina Pham MRN: [id number] Date of Birth: 13-May-1976

## 2018-11-13 ENCOUNTER — Other Ambulatory Visit: Payer: Self-pay

## 2018-11-13 MED ORDER — HYDROCODONE-ACETAMINOPHEN 5-325 MG PO TABS: 1.0000 | ORAL_TABLET | Freq: Three times a day (TID) | ORAL | 0 refills | Status: DC | PRN

## 2018-11-14 ENCOUNTER — Ambulatory Visit (HOSPITAL_COMMUNITY): Payer: BLUE CROSS/BLUE SHIELD | Admitting: Occupational Therapy

## 2018-11-19 ENCOUNTER — Ambulatory Visit (HOSPITAL_COMMUNITY): Payer: BLUE CROSS/BLUE SHIELD | Admitting: Occupational Therapy

## 2018-11-19 ENCOUNTER — Other Ambulatory Visit: Payer: Self-pay

## 2018-11-19 ENCOUNTER — Encounter (HOSPITAL_COMMUNITY): Payer: Self-pay | Admitting: Occupational Therapy

## 2018-11-19 DIAGNOSIS — R29898 Other symptoms and signs involving the musculoskeletal system: Secondary | ICD-10-CM | POA: Diagnosis not present

## 2018-11-19 DIAGNOSIS — M25611 Stiffness of right shoulder, not elsewhere classified: Secondary | ICD-10-CM

## 2018-11-19 DIAGNOSIS — M25511 Pain in right shoulder: Secondary | ICD-10-CM

## 2018-11-19 NOTE — Therapy (Signed)
[facility] [address] [place], [place], [address] Phone: [phone]   Fax:  [phone]  Occupational Therapy Treatment  Patient Details  Name: Tina Pham MRN: [id number] Date of Birth: Feb 06, 1976 Referring Provider (OT): Fuller Canada, MD   Encounter Date: 11/19/2018  OT End of Session - 11/19/18 1201    Visit Number  3    Number of Visits  8    Date for OT Re-Evaluation  12/04/18    Authorization Type  [employer] 30 visits with 0 used    Authorization - Visit Number  3    Authorization - Number of Visits  30    OT Start Time  1122   pt arrived late   OT Stop Time  1200    OT Time Calculation (min)  38 min    Activity Tolerance  Patient tolerated treatment well    Behavior During Therapy  [facility] for tasks assessed/performed       Past Medical History:  Diagnosis Date  . Anxiety   . Arthritis   . Chronic back pain   . Chronic bronchitis (HCC)   . Depression   . Diabetes mellitus    lost weight and is not on meds now.  . High cholesterol   . History of kidney stones   . Hypertension   . Migraine   . Neuropathic pain of foot   . Plantar fasciitis   . Sciatica   . Torn rotator cuff     Past Surgical History:  Procedure Laterality Date  . BACK SURGERY     herniated disc; had disc removed and then fusion; total of 3 surgeries  . EYE SURGERY Bilateral    removal of cyst and straightening of muscles  . KNEE ARTHROSCOPY WITH MEDIAL MENISECTOMY Left 12/27/2016   Procedure: LEFT KNEE DIAGNOSTIC ARTHROSCOPY;  Surgeon: Vickki Hearing, MD;  Location: [facility];  Service: Orthopedics;  Laterality: Left;  . SHOULDER OPEN ROTATOR CUFF REPAIR Right 09/19/2018   Procedure: ROTATOR CUFF REPAIR SHOULDER OPEN;  Surgeon: Vickki Hearing, MD;  Location: [facility];  Service: Orthopedics;  Laterality: Right;  . TUBAL LIGATION      There were no vitals filed for this visit.  Subjective Assessment - 11/19/18 1123    Subjective    S: The only exercise that bothers me is that jumping jack one.     Currently in Pain?  Yes    Pain Score  2     Pain Location  Shoulder    Pain Orientation  Right    Pain Descriptors / Indicators  Aching;Sore    Pain Type  Acute pain    Pain Radiating Towards  up to the neck    Pain Onset  More than a month ago    Pain Frequency  Constant    Aggravating Factors   stiff when waking up, increased use    Pain Relieving Factors  stretching, over the counter pain medication    Effect of Pain on Daily Activities  minimal effect on ADLs    Multiple Pain Sites  No         [facility] OT Assessment - 11/19/18 1123      Assessment   Medical Diagnosis  right RCR      Precautions   Precautions  Shoulder    Type of Shoulder Precautions  No lifting anything heavy especially overhead.  OT Treatments/Exercises (OP) - 11/19/18 1125      Exercises   Exercises  Shoulder      Shoulder Exercises: Supine   Protraction  PROM;5 reps;AROM;12 reps    Horizontal ABduction  PROM;5 reps;AROM;12 reps    External Rotation  PROM;5 reps;AROM;12 reps    Internal Rotation  PROM;5 reps;AROM;12 reps    Flexion  PROM;5 reps;AROM;12 reps    ABduction  PROM;5 reps;AROM;12 reps      Shoulder Exercises: Sidelying   External Rotation  AROM;12 reps    Internal Rotation  AROM;12 reps    Flexion  AROM;12 reps    ABduction  AROM;12 reps    Other Sidelying Exercises  protraction, A/ROM, 12X    Other Sidelying Exercises  horizontal abduction, A/ROM 12x      Shoulder Exercises: Standing   Protraction  AROM;12 reps    Horizontal ABduction  AROM;12 reps    External Rotation  AROM;12 reps    Internal Rotation  AROM;12 reps    Flexion  AROM;12 reps    ABduction  AROM;12 reps    Extension  Theraband;10 reps    Theraband Level (Shoulder Extension)  Level 2 (Red)    Row  Theraband;10 reps    Theraband Level (Shoulder Row)  Level 2 (Red)    Retraction  Theraband;10 reps    Theraband Level  (Shoulder Retraction)  Level 2 (Red)      Shoulder Exercises: ROM/Strengthening   UBE (Upper Arm Bike)  Level 1 2' forward 2' reverse    "W" Arms  10X    X to V Arms  10X    Proximal Shoulder Strengthening, Supine  10X each, no rest breaks    Proximal Shoulder Strengthening, Seated  10X each, 1 rest break      Manual Therapy   Manual Therapy  Myofascial release    Manual therapy comments  completed separately from therapeutic exercises    Myofascial Release  myofascial release to right upper arm, trapezius, and scapularis regions to decrease pain and fascial restrictions and increase joint ROM               OT Short Term Goals - 11/12/18 1145      OT SHORT TERM GOAL #1   Title  Patient will be educated and independent with HEP to faciliate progress in therapy and allow her to return to using her RUE as her dominant extremity for all daily and work activities.     Time  4    Period  Weeks    Status  On-going    Target Date  12/04/18      OT SHORT TERM GOAL #2   Title  Patient will increase A/ROM of her RUE to WNL in order to complete overhead reaching activities with less difficulty.     Time  4    Period  Weeks    Status  On-going      OT SHORT TERM GOAL #3   Title  Patient will increase RUE strength to 4+/5 in order to return to using her RUE for all work tasks and normal daily lifting activities.     Time  4    Period  Weeks    Status  On-going      OT SHORT TERM GOAL #4   Title  Patient will decrease pain level to 2/10 or less in order to complete daily and work related tasks with increased comfort and need for pain management techniques.  Time  4    Period  Weeks    Status  On-going      OT SHORT TERM GOAL #5   Title  Patient will be educated on self myofascial release techniques in order to decrease her fascial restrictions in the RUE while demonstrating increased ROM and reports of decreased pain during tasks.     Time  4    Period  Weeks    Status   On-going               Plan - 11/19/18 1141    Clinical Impression Statement  A: Pt reporting soreness with abduction during HEP. Continued with manual therapy to address muscle knots at trapezius and medial scapular regions. Added sidelying A/ROM and W arms for improved scapular stability. Verbal cuing to slow down and not use momentum for movements. Continued with scapular theraband and added UBE. Verbal cuing for form and technique during session.     Body Structure / Function / Physical Skills  ADL;Strength;Pain;UE functional use;ROM;IADL;Fascial restriction;Mobility    Plan  P: Increase scapular theraband repetitions to 15 and add to HEP. Add Y lift off        Patient will benefit from skilled therapeutic intervention in order to improve the following deficits and impairments:  Body Structure / Function / Physical Skills  Visit Diagnosis: Other symptoms and signs involving the musculoskeletal system  Acute pain of right shoulder  Stiffness of right shoulder, not elsewhere classified    Problem List Patient Active Problem List   Diagnosis Date Noted  . S/P right rotator cuff repair open 09/19/18 10/04/2018  . Complete tear of right rotator cuff   . Chronic pain of left knee   . Left shoulder pain 09/21/2015  . Disorders of bursae and tendons in shoulder region, unspecified 09/25/2013   Ezra Sites, OTR/L  [phone] 11/19/2018, 12:03 PM  [facility] [address] [place], [place], [address] Phone: [phone]   Fax:  [phone]  Name: Tina Pham MRN: [id number] Date of Birth: 11-19-1975

## 2018-11-20 MED ORDER — HYDROCODONE-ACETAMINOPHEN 5-325 MG PO TABS: 1.0000 | ORAL_TABLET | Freq: Three times a day (TID) | ORAL | 0 refills | Status: DC | PRN

## 2018-11-26 ENCOUNTER — Ambulatory Visit (HOSPITAL_COMMUNITY): Payer: BLUE CROSS/BLUE SHIELD | Attending: Orthopedic Surgery | Admitting: Occupational Therapy

## 2018-11-26 ENCOUNTER — Other Ambulatory Visit: Payer: Self-pay

## 2018-11-26 DIAGNOSIS — M25511 Pain in right shoulder: Secondary | ICD-10-CM | POA: Insufficient documentation

## 2018-11-26 DIAGNOSIS — R29898 Other symptoms and signs involving the musculoskeletal system: Secondary | ICD-10-CM | POA: Insufficient documentation

## 2018-11-26 DIAGNOSIS — M25611 Stiffness of right shoulder, not elsewhere classified: Secondary | ICD-10-CM | POA: Insufficient documentation

## 2018-11-27 MED ORDER — HYDROCODONE-ACETAMINOPHEN 5-325 MG PO TABS: 1.0000 | ORAL_TABLET | Freq: Three times a day (TID) | ORAL | 0 refills | Status: DC | PRN

## 2018-12-03 ENCOUNTER — Other Ambulatory Visit: Payer: Self-pay

## 2018-12-03 ENCOUNTER — Ambulatory Visit (HOSPITAL_COMMUNITY): Payer: BLUE CROSS/BLUE SHIELD | Admitting: Occupational Therapy

## 2018-12-03 ENCOUNTER — Encounter (HOSPITAL_COMMUNITY): Payer: Self-pay | Admitting: Occupational Therapy

## 2018-12-03 DIAGNOSIS — M25611 Stiffness of right shoulder, not elsewhere classified: Secondary | ICD-10-CM | POA: Diagnosis present

## 2018-12-03 DIAGNOSIS — M25511 Pain in right shoulder: Secondary | ICD-10-CM | POA: Diagnosis present

## 2018-12-03 DIAGNOSIS — R29898 Other symptoms and signs involving the musculoskeletal system: Secondary | ICD-10-CM | POA: Diagnosis present

## 2018-12-03 NOTE — Therapy (Signed)
[facility] [address] [place], [place], [address] Phone: [phone]   Fax:  [phone]  Occupational Therapy Treatment  Patient Details  Name: Tina Pham MRN: [id number] Date of Birth: 1975-12-29 Referring Provider (OT): Fuller Canada, MD   Encounter Date: 12/03/2018  OT End of Session - 12/03/18 1206    Visit Number  4    Number of Visits  8    Date for OT Re-Evaluation  12/04/18    Authorization Type  [employer] 30 visits with 0 used    Authorization - Visit Number  4    Authorization - Number of Visits  30    OT Start Time  1131    OT Stop Time  1211    OT Time Calculation (min)  40 min    Activity Tolerance  Patient tolerated treatment well    Behavior During Therapy  [facility] for tasks assessed/performed       Past Medical History:  Diagnosis Date  . Anxiety   . Arthritis   . Chronic back pain   . Chronic bronchitis (HCC)   . Depression   . Diabetes mellitus    lost weight and is not on meds now.  . High cholesterol   . History of kidney stones   . Hypertension   . Migraine   . Neuropathic pain of foot   . Plantar fasciitis   . Sciatica   . Torn rotator cuff     Past Surgical History:  Procedure Laterality Date  . BACK SURGERY     herniated disc; had disc removed and then fusion; total of 3 surgeries  . EYE SURGERY Bilateral    removal of cyst and straightening of muscles  . KNEE ARTHROSCOPY WITH MEDIAL MENISECTOMY Left 12/27/2016   Procedure: LEFT KNEE DIAGNOSTIC ARTHROSCOPY;  Surgeon: Vickki Hearing, MD;  Location: [facility];  Service: Orthopedics;  Laterality: Left;  . SHOULDER OPEN ROTATOR CUFF REPAIR Right 09/19/2018   Procedure: ROTATOR CUFF REPAIR SHOULDER OPEN;  Surgeon: Vickki Hearing, MD;  Location: [facility];  Service: Orthopedics;  Laterality: Right;  . TUBAL LIGATION      There were no vitals filed for this visit.  Subjective Assessment - 12/03/18 1134    Subjective   S: It's been  aching some.     Currently in Pain?  Yes    Pain Score  2     Pain Location  Shoulder    Pain Orientation  Right    Pain Descriptors / Indicators  Aching;Sore    Pain Type  Acute pain    Pain Radiating Towards  up the neck    Pain Onset  More than a month ago    Pain Frequency  Constant    Aggravating Factors   stiff when waking up    Pain Relieving Factors  stretching, over the counter pain medication    Effect of Pain on Daily Activities  minimal effect on ADLs.    Multiple Pain Sites  No         [facility] OT Assessment - 12/03/18 1133      Assessment   Medical Diagnosis  right RCR      Precautions   Precautions  Shoulder    Type of Shoulder Precautions  No lifting anything heavy especially overhead.                OT Treatments/Exercises (OP) - 12/03/18 1134      Exercises  Exercises  Shoulder      Shoulder Exercises: Supine   Protraction  PROM;5 reps;AROM;12 reps    Horizontal ABduction  PROM;5 reps;AROM;12 reps    External Rotation  PROM;5 reps;AROM;12 reps    Internal Rotation  PROM;5 reps;AROM;12 reps    Flexion  PROM;5 reps;AROM;12 reps    ABduction  PROM;5 reps;AROM;12 reps      Shoulder Exercises: Sidelying   External Rotation  AROM;12 reps    Internal Rotation  AROM;12 reps    Flexion  AROM;12 reps    ABduction  AROM;12 reps    Other Sidelying Exercises  protraction, A/ROM, 12X    Other Sidelying Exercises  horizontal abduction, A/ROM 12x      Shoulder Exercises: Standing   Protraction  AROM;12 reps    Horizontal ABduction  AROM;12 reps    External Rotation  AROM;12 reps    Internal Rotation  AROM;12 reps    Flexion  AROM;12 reps    ABduction  AROM;12 reps    Extension  Theraband;15 reps    Theraband Level (Shoulder Extension)  Level 2 (Red)    Row  Theraband;15 reps    Theraband Level (Shoulder Row)  Level 2 (Red)    Retraction  Theraband;15 reps    Theraband Level (Shoulder Retraction)  Level 2 (Red)      Shoulder Exercises:  ROM/Strengthening   "W" Arms  10X    X to V Arms  12X    Proximal Shoulder Strengthening, Supine  12X each, no rest breaks    Proximal Shoulder Strengthening, Seated  12X each, 2 rest breaks    Ball on Wall  1' flexion, 1' abduction    Other ROM/Strengthening Exercises  Y lift off, 10X      Manual Therapy   Manual Therapy  Myofascial release    Manual therapy comments  completed separately from therapeutic exercises    Myofascial Release  myofascial release to right upper arm, trapezius, and scapularis regions to decrease pain and fascial restrictions and increase joint ROM               OT Short Term Goals - 11/12/18 1145      OT SHORT TERM GOAL #1   Title  Patient will be educated and independent with HEP to faciliate progress in therapy and allow her to return to using her RUE as her dominant extremity for all daily and work activities.     Time  4    Period  Weeks    Status  On-going    Target Date  12/04/18      OT SHORT TERM GOAL #2   Title  Patient will increase A/ROM of her RUE to WNL in order to complete overhead reaching activities with less difficulty.     Time  4    Period  Weeks    Status  On-going      OT SHORT TERM GOAL #3   Title  Patient will increase RUE strength to 4+/5 in order to return to using her RUE for all work tasks and normal daily lifting activities.     Time  4    Period  Weeks    Status  On-going      OT SHORT TERM GOAL #4   Title  Patient will decrease pain level to 2/10 or less in order to complete daily and work related tasks with increased comfort and need for pain management techniques.     Time  4  Period  Weeks    Status  On-going      OT SHORT TERM GOAL #5   Title  Patient will be educated on self myofascial release techniques in order to decrease her fascial restrictions in the RUE while demonstrating increased ROM and reports of decreased pain during tasks.     Time  4    Period  Weeks    Status  On-going                Plan - 12/03/18 1203    Clinical Impression Statement  A: Manual therapy completed to address fascial restrictions in upper arm and trapezius today. Continued iwth A/ROM, added y lift off and ball on wall for scapular stability and strengthening. Increased theraband exercises to 15 repetitions. Verbal cuing for form and technique.     Body Structure / Function / Physical Skills  ADL;Strength;Pain;UE functional use;ROM;IADL;Fascial restriction;Mobility    Plan  P: Reassessment, FOTO, determine if ready for discharge with HEP       Patient will benefit from skilled therapeutic intervention in order to improve the following deficits and impairments:  Body Structure / Function / Physical Skills  Visit Diagnosis: Other symptoms and signs involving the musculoskeletal system  Acute pain of right shoulder  Stiffness of right shoulder, not elsewhere classified    Problem List Patient Active Problem List   Diagnosis Date Noted  . S/P right rotator cuff repair open 09/19/18 10/04/2018  . Complete tear of right rotator cuff   . Chronic pain of left knee   . Left shoulder pain 09/21/2015  . Disorders of bursae and tendons in shoulder region, unspecified 09/25/2013   Ezra SitesLeslie Adante Courington, OTR/L  [phone] 12/03/2018, 12:12 PM  [facility] [address] [place], [place], [address] Phone: [phone]   Fax:  [phone]  Name: Suzette BattiestLaura W Veal MRN: [id number] Date of Birth: 04/18/1976

## 2018-12-04 MED ORDER — HYDROCODONE-ACETAMINOPHEN 5-325 MG PO TABS: 1.0000 | ORAL_TABLET | Freq: Three times a day (TID) | ORAL | 0 refills | Status: DC | PRN

## 2018-12-09 ENCOUNTER — Encounter (HOSPITAL_COMMUNITY): Payer: BLUE CROSS/BLUE SHIELD

## 2018-12-10 ENCOUNTER — Telehealth (HOSPITAL_COMMUNITY): Payer: Self-pay | Admitting: General Practice

## 2018-12-10 ENCOUNTER — Other Ambulatory Visit: Payer: Self-pay

## 2018-12-10 ENCOUNTER — Ambulatory Visit (HOSPITAL_COMMUNITY): Payer: BLUE CROSS/BLUE SHIELD | Admitting: Occupational Therapy

## 2018-12-10 NOTE — Telephone Encounter (Signed)
12/10/18  pt called to reschedule for Thursday... her relief person at work isn't coming in until noon so she can't leave to come to the appt

## 2018-12-11 ENCOUNTER — Other Ambulatory Visit: Payer: Self-pay | Admitting: Orthopedic Surgery

## 2018-12-11 MED ORDER — HYDROCODONE-ACETAMINOPHEN 5-325 MG PO TABS: 1.0000 | ORAL_TABLET | Freq: Three times a day (TID) | ORAL | 0 refills | Status: AC | PRN

## 2018-12-12 ENCOUNTER — Ambulatory Visit (HOSPITAL_COMMUNITY): Payer: BLUE CROSS/BLUE SHIELD | Admitting: Occupational Therapy

## 2018-12-12 ENCOUNTER — Encounter (HOSPITAL_COMMUNITY): Payer: BLUE CROSS/BLUE SHIELD | Admitting: Occupational Therapy

## 2018-12-12 ENCOUNTER — Encounter (HOSPITAL_COMMUNITY): Payer: Self-pay | Admitting: Occupational Therapy

## 2018-12-12 ENCOUNTER — Other Ambulatory Visit: Payer: Self-pay

## 2018-12-12 DIAGNOSIS — R29898 Other symptoms and signs involving the musculoskeletal system: Secondary | ICD-10-CM | POA: Diagnosis not present

## 2018-12-12 DIAGNOSIS — M25511 Pain in right shoulder: Secondary | ICD-10-CM

## 2018-12-12 DIAGNOSIS — M25611 Stiffness of right shoulder, not elsewhere classified: Secondary | ICD-10-CM

## 2018-12-12 NOTE — Therapy (Signed)
[place], [place], [address] Phone: [phone]   Fax:  [phone]  Occupational Therapy Reassessment, Treatment, Discharge  Patient Details  Name: Tina Pham MRN: [id number] Date of Birth: [birth year] Referring Provider (OT): Arther Abbott, MD   Encounter Date: 12/12/2018  OT End of Session - 12/12/18 6125    Visit Number  4    Number of Visits  8    Date for OT Re-Evaluation  12/04/18    Authorization Type  [employer]BS 30 visits with 0 used    Authorization - Visit Number  4    Authorization - Number of Visits  30    OT Start Time  4832    OT Stop Time  1143    OT Time Calculation (min)  27 min    Activity Tolerance  Patient tolerated treatment well    Behavior During Therapy  [facility] for tasks assessed/performed       Past Medical History:  Diagnosis Date  . Anxiety   . Arthritis   . Chronic back pain   . Chronic bronchitis ([place])   . Depression   . Diabetes mellitus    lost weight and is not on meds now.  . High cholesterol   . History of kidney stones   . Hypertension   . Migraine   . Neuropathic pain of foot   . Plantar fasciitis   . Sciatica   . Torn rotator cuff     Past Surgical History:  Procedure Laterality Date  . BACK SURGERY     herniated disc; had disc removed and then fusion; total of 3 surgeries  . EYE SURGERY Bilateral    removal of cyst and straightening of muscles  . KNEE ARTHROSCOPY WITH MEDIAL MENISECTOMY Left 12/27/2016   Procedure: LEFT KNEE DIAGNOSTIC ARTHROSCOPY;  Surgeon: Carole Civil, MD;  Location: [facility];  Service: Orthopedics;  Laterality: Left;  . SHOULDER OPEN ROTATOR CUFF REPAIR Right 09/19/2018   Procedure: ROTATOR CUFF REPAIR SHOULDER OPEN;  Surgeon: Carole Civil, MD;  Location: [facility];  Service: Orthopedics;  Laterality: Right;  . TUBAL LIGATION      There were no vitals filed for this visit.  Subjective Assessment - 12/12/18 1115    Subjective   S: It's been doing good I think.     Currently in Pain?  No/denies         [facility] OT Assessment - 12/12/18 1115      Assessment   Medical Diagnosis  right RCR      Precautions   Precautions  Shoulder    Type of Shoulder Precautions  No lifting anything heavy especially overhead.       Palpation   Palpation comment  Moderate fascial restrictions in right upper trapezius proximal to the lateral aspect of neck.       AROM   Overall AROM Comments  Assessed seated. IR/er adducted    AROM Assessment Site  Shoulder    Right/Left Shoulder  Right    Right Shoulder Flexion  180 Degrees   134 previous   Right Shoulder ABduction  180 Degrees   150 previous   Right Shoulder Internal Rotation  90 Degrees   same as previous   Right Shoulder External Rotation  90 Degrees   same as previous     PROM   Overall PROM Comments  Assessed supine. IR/er adducted.     PROM Assessment Site  Shoulder  Right/Left Shoulder  Right    Right Shoulder Flexion  180 Degrees   same as previous   Right Shoulder ABduction  180 Degrees   same as previous   Right Shoulder Internal Rotation  90 Degrees   same as previous   Right Shoulder External Rotation  90 Degrees   same as previous     Strength   Overall Strength Comments  Assessed seated. IR/er adducted    Strength Assessment Site  Shoulder    Right/Left Shoulder  Right    Right Shoulder Flexion  4+/5   3+/5 previous   Right Shoulder ABduction  4+/5   3+/5 previous   Right Shoulder Internal Rotation  5/5   same as previous   Right Shoulder External Rotation  5/5   same as previous              OT Treatments/Exercises (OP) - 12/12/18 1116      Exercises   Exercises  Shoulder      Shoulder Exercises: Supine   Protraction  PROM;5 reps;Strengthening;12 reps    Protraction Weight (lbs)  1    Horizontal ABduction  PROM;5 reps;Strengthening;12 reps    Horizontal ABduction Weight (lbs)  1    External Rotation  PROM;5  reps;Strengthening;12 reps    External Rotation Weight (lbs)  1    Internal Rotation  PROM;5 reps;Strengthening;12 reps    Internal Rotation Weight (lbs)  1    Flexion  PROM;5 reps;Strengthening;12 reps    Shoulder Flexion Weight (lbs)  1    ABduction  PROM;5 reps;Strengthening;12 reps    Shoulder ABduction Weight (lbs)  1      Shoulder Exercises: Standing   Protraction  Theraband;10 reps    Theraband Level (Shoulder Protraction)  Level 3 (Green)    Horizontal ABduction  Theraband;10 reps    Theraband Level (Shoulder Horizontal ABduction)  Level 3 (Green)    External Rotation  Theraband;10 reps    Theraband Level (Shoulder External Rotation)  Level 3 (Green)    Internal Rotation  Theraband;10 reps    Theraband Level (Shoulder Internal Rotation)  Level 3 (Green)    Flexion  Theraband;10 reps    Theraband Level (Shoulder Flexion)  Level 3 (Green)    ABduction  Theraband;10 reps    Theraband Level (Shoulder ABduction)  Level 3 (Green)      Manual Therapy   Manual Therapy  Myofascial release    Manual therapy comments  completed separately from therapeutic exercises    Myofascial Release  myofascial release to right upper arm, trapezius, and scapularis regions to decrease pain and fascial restrictions and increase joint ROM             OT Education - 12/12/18 1133    Education Details  green theraband strengthening    Person(s) Educated  Patient    Methods  Explanation;Demonstration;Verbal cues;Handout    Comprehension  Verbalized understanding;Verbal cues required;Returned demonstration       OT Short Term Goals - 12/12/18 1132      OT SHORT TERM GOAL #1   Title  Patient will be educated and independent with HEP to faciliate progress in therapy and allow her to return to using her RUE as her dominant extremity for all daily and work activities.     Time  4    Period  Weeks    Status  Achieved    Target Date  12/04/18      OT SHORT TERM GOAL #2  Title  Patient will  increase A/ROM of her RUE to WNL in order to complete overhead reaching activities with less difficulty.     Time  4    Period  Weeks    Status  Achieved      OT SHORT TERM GOAL #3   Title  Patient will increase RUE strength to 4+/5 in order to return to using her RUE for all work tasks and normal daily lifting activities.     Time  4    Period  Weeks    Status  Achieved      OT SHORT TERM GOAL #4   Title  Patient will decrease pain level to 2/10 or less in order to complete daily and work related tasks with increased comfort and need for pain management techniques.     Time  4    Period  Weeks    Status  Achieved      OT SHORT TERM GOAL #5   Title  Patient will be educated on self myofascial release techniques in order to decrease her fascial restrictions in the RUE while demonstrating increased ROM and reports of decreased pain during tasks.     Time  4    Period  Weeks    Status  Achieved               Plan - 12/12/18 1150    Clinical Impression Statement  A: Reassessment completed this date. Pt has made excellent progress and has met all her goals. She is completing ADL and work duties without difficulty, has occasional soreness with repetitive use. Pt progressing to light strengthening today, provided with updated HEP for continued strengthening. Pt is agreeable to discharge today.     Body Structure / Function / Physical Skills  ADL;Strength;Pain;UE functional use;ROM;IADL;Fascial restriction;Mobility    Plan  P: Discharge pt       Patient will benefit from skilled therapeutic intervention in order to improve the following deficits and impairments:  Body Structure / Function / Physical Skills  Visit Diagnosis: Other symptoms and signs involving the musculoskeletal system  Acute pain of right shoulder  Stiffness of right shoulder, not elsewhere classified    Problem List Patient Active Problem List   Diagnosis Date Noted  . S/P right rotator cuff repair open  09/19/18 10/04/2018  . Complete tear of right rotator cuff   . Chronic pain of left knee   . Left shoulder pain 09/21/2015  . Disorders of bursae and tendons in shoulder region, unspecified 09/25/2013   Guadelupe Sabin, OTR/L  [phone] 12/12/2018, 11:58 AM  [facility] [address] [place], [place], [address] Phone: [phone]   Fax:  [phone]  Name: Tina Pham MRN: [id number] Date of Birth: 1975-12-21   OCCUPATIONAL THERAPY DISCHARGE SUMMARY  Visits from Start of Care: 4  Current functional level related to goals / functional outcomes: See above. Pt is performing ADLs and work tasks independently and without difficulty.   Remaining deficits: Occasional soreness with sustained use   Education / Equipment: HEP for strengthening and stability Plan: Patient agrees to discharge.  Patient goals were met. Patient is being discharged due to meeting the stated rehab goals.  ?????

## 2018-12-12 NOTE — Patient Instructions (Signed)

## 2018-12-13 NOTE — Progress Notes (Signed)
[facility] OT Assessment - 12/12/18 1115            Assessment   Medical Diagnosis  right RCR        Precautions   Precautions  Shoulder    Type of Shoulder Precautions  No lifting anything heavy especially overhead.         Palpation   Palpation comment  Moderate fascial restrictions in right upper trapezius proximal to the lateral aspect of neck.         AROM   Overall AROM Comments  Assessed seated. IR/er adducted    AROM Assessment Site  Shoulder    Right/Left Shoulder  Right    Right Shoulder Flexion  180 Degrees   134 previous   Right Shoulder ABduction  180 Degrees   150 previous   Right Shoulder Internal Rotation  90 Degrees   same as previous   Right Shoulder External Rotation  90 Degrees   same as previous       PROM   Overall PROM Comments  Assessed supine. IR/er adducted.     PROM Assessment Site  Shoulder    Right/Left Shoulder  Right    Right Shoulder Flexion  180 Degrees   same as previous   Right Shoulder ABduction  180 Degrees   same as previous   Right Shoulder Internal Rotation  90 Degrees   same as previous   Right Shoulder External Rotation  90 Degrees   same as previous       Strength   Overall Strength Comments  Assessed seated. IR/er adducted    Strength Assessment Site  Shoulder    Right/Left Shoulder  Right    Right Shoulder Flexion  4+/5   3+/5 previous   Right Shoulder ABduction  4+/5   3+/5 previous   Right Shoulder Internal Rotation  5/5   same as previous   Right Shoulder External Rotation  5/5   same as previous

## 2018-12-17 ENCOUNTER — Ambulatory Visit (HOSPITAL_COMMUNITY): Payer: BLUE CROSS/BLUE SHIELD | Admitting: Occupational Therapy

## 2018-12-18 ENCOUNTER — Ambulatory Visit (INDEPENDENT_AMBULATORY_CARE_PROVIDER_SITE_OTHER): Payer: BLUE CROSS/BLUE SHIELD | Admitting: Orthopedic Surgery

## 2018-12-18 ENCOUNTER — Other Ambulatory Visit: Payer: Self-pay

## 2018-12-18 ENCOUNTER — Encounter: Payer: Self-pay | Admitting: Orthopedic Surgery

## 2018-12-18 VITALS — Temp 97.5°F | Ht 68.0 in | Wt 231.0 lb

## 2018-12-18 DIAGNOSIS — Z9889 Other specified postprocedural states: Secondary | ICD-10-CM

## 2018-12-18 NOTE — Progress Notes (Signed)
.   POSTOP VISIT  POD # 90  Chief Complaint  Patient presents with  . Routine Post Op    09/19/18 improving/ has finished PT still sore at times     43 year old female rotator cuff repair right shoulder complains of mild soreness.  Completed PT now on home exercise program tolerating that well   Encounter Diagnosis  Name Primary?  . S/P right rotator cuff repair open 09/19/18 Yes    No current outpatient medications on file.  Full range of motion abduction and flexion her strength is 5-/5 compared to the left side  Postoperative plan (Work, WB, No orders of the defined types were placed in this encounter. ,FU)  Home exercises  Follow-up 3 months

## 2018-12-19 ENCOUNTER — Encounter (HOSPITAL_COMMUNITY): Payer: BLUE CROSS/BLUE SHIELD | Admitting: Occupational Therapy

## 2019-02-28 ENCOUNTER — Encounter: Payer: Self-pay | Admitting: Orthopedic Surgery

## 2019-03-10 ENCOUNTER — Other Ambulatory Visit: Payer: Self-pay

## 2019-03-10 ENCOUNTER — Encounter (HOSPITAL_COMMUNITY): Payer: Self-pay | Admitting: Emergency Medicine

## 2019-03-10 ENCOUNTER — Emergency Department (HOSPITAL_COMMUNITY)
Admission: EM | Admit: 2019-03-10 | Discharge: 2019-03-10 | Disposition: A | Discharge status: Home / Self Care | Payer: BLUE CROSS/BLUE SHIELD | Attending: Emergency Medicine | Admitting: Emergency Medicine

## 2019-03-10 ENCOUNTER — Emergency Department (HOSPITAL_COMMUNITY): Payer: BLUE CROSS/BLUE SHIELD

## 2019-03-10 DIAGNOSIS — I10 Essential (primary) hypertension: Secondary | ICD-10-CM | POA: Insufficient documentation

## 2019-03-10 DIAGNOSIS — F17210 Nicotine dependence, cigarettes, uncomplicated: Secondary | ICD-10-CM | POA: Diagnosis not present

## 2019-03-10 DIAGNOSIS — E119 Type 2 diabetes mellitus without complications: Secondary | ICD-10-CM | POA: Insufficient documentation

## 2019-03-10 DIAGNOSIS — M25511 Pain in right shoulder: Secondary | ICD-10-CM | POA: Insufficient documentation

## 2019-03-10 MED ORDER — HYDROCODONE-ACETAMINOPHEN 5-325 MG PO TABS
1.0000 | ORAL_TABLET | Freq: Once | ORAL | Status: AC
  Administered 2019-03-10: 1 via ORAL
  Filled 2019-03-10: qty 1

## 2019-03-10 MED ORDER — HYDROCODONE-ACETAMINOPHEN 5-325 MG PO TABS: 1.0000 | ORAL_TABLET | Freq: Four times a day (QID) | ORAL | 0 refills | Status: DC | PRN

## 2019-03-10 NOTE — Discharge Instructions (Signed)
You were seen in the ED today for pain to your right shoulder.  An x-ray was obtained.  It does not show any breaks.  I am concerned that you might have injured your rotator cuff muscles.  Please use sling for comfort.  I written a very short course of pain medication for you to take as needed.  Please follow-up with Dr. Ruthe Mannan office as he was the one who operated on your shoulder in February.

## 2019-03-10 NOTE — ED Triage Notes (Signed)
Pt states that she had a pop in her neck and right shoulder. She had a rotator cuff repair in feb on that same shoulder and the pain has went up her neck

## 2019-03-10 NOTE — ED Provider Notes (Signed)
[facility] EMERGENCY DEPARTMENT Provider Note   CSN: [id number] Arrival date & time: 03/10/19  1354    History   Chief Complaint Chief Complaint  Patient presents with  . Shoulder Pain    HPI Tina Pham is a 43 y.o. female with past medical history torn rotator cuff status post repair in February 2020, depression, anxiety, chronic back pain, diabetes, hypertension who presents to the ED today complaining of sudden onset, constant, throbbing, sharp, right shoulder pain that began yesterday.  Patient reports that she was playing with her children when she felt an immediate pop in her shoulder and into her trapezius muscle.  She states that she has been taking ibuprofen and Tylenol without relief.  Patient states that she then woke up with a headache this morning.  She does have a history of migraines and states this feels similar.  She called Dr. Doreene Adas office this morning who operated on her shoulder in February.  He is currently out of office and Dr. Luna Glasgow his partner has no openings today.  Patient advised to come to the ED or urgent care for further evaluation.  Denies vision changes, rash, speech difficulties, unilateral weakness or numbness, syncope, weakness or numbness, any other associated symptoms.       Past Medical History:  Diagnosis Date  . Anxiety   . Arthritis   . Chronic back pain   . Chronic bronchitis ([place])   . Depression   . Diabetes mellitus    lost weight and is not on meds now.  . High cholesterol   . History of kidney stones   . Hypertension   . Migraine   . Neuropathic pain of foot   . Plantar fasciitis   . Sciatica   . Torn rotator cuff     Patient Active Problem List   Diagnosis Date Noted  . S/P right rotator cuff repair open 09/19/18 10/04/2018  . Complete tear of right rotator cuff   . Chronic pain of left knee   . Left shoulder pain 09/21/2015  . Disorders of bursae and tendons in shoulder region, unspecified 09/25/2013     Past Surgical History:  Procedure Laterality Date  . BACK SURGERY     herniated disc; had disc removed and then fusion; total of 3 surgeries  . EYE SURGERY Bilateral    removal of cyst and straightening of muscles  . KNEE ARTHROSCOPY WITH MEDIAL MENISECTOMY Left 12/27/2016   Procedure: LEFT KNEE DIAGNOSTIC ARTHROSCOPY;  Surgeon: Carole Civil, MD;  Location: [facility];  Service: Orthopedics;  Laterality: Left;  . SHOULDER OPEN ROTATOR CUFF REPAIR Right 09/19/2018   Procedure: ROTATOR CUFF REPAIR SHOULDER OPEN;  Surgeon: Carole Civil, MD;  Location: [facility];  Service: Orthopedics;  Laterality: Right;  . TUBAL LIGATION       OB History    Gravida  3   Para  3   Term  3   Preterm      AB      Living  3     SAB      TAB      Ectopic      Multiple      Live Births               Home Medications    Prior to Admission medications   Medication Sig Start Date End Date Taking? Authorizing Provider  HYDROcodone-acetaminophen (NORCO/VICODIN) 5-325 MG tablet Take 1 tablet by mouth every 6 (six) hours as needed  for severe pain. 03/10/19   Eustaquio Maize, PA-C    Family History Family History  Problem Relation Age of Onset  . Heart failure Mother   . COPD Mother   . Diabetes Mother   . Heart failure Father   . Diabetes Other     Social History Social History   Tobacco Use  . Smoking status: Current Every Day Smoker    Packs/day: 1.00    Years: 20.00    Pack years: 20.00    Types: Cigarettes  . Smokeless tobacco: Never Used  Substance Use Topics  . Alcohol use: No  . Drug use: No     Allergies   Codeine, Benadryl [diphenhydramine hcl], and Compazine   Review of Systems Review of Systems  Constitutional: Negative for chills and fever.  Gastrointestinal: Negative for nausea and vomiting.  Musculoskeletal: Positive for arthralgias and neck pain. Negative for neck stiffness.  Skin: Negative for rash.  Neurological: Positive for headaches.  Negative for dizziness, syncope, weakness, light-headedness and numbness.     Physical Exam Updated Vital Signs BP 137/78 (BP Location: Left Arm)   Pulse 88   Temp 99.1 F (37.3 C) (Oral)   Resp 20   Ht '5\' 8"'  (1.727 m)   Wt 108.9 kg   LMP 02/22/2019   SpO2 100%   BMI 36.49 kg/m   Physical Exam Vitals signs and nursing note reviewed.  Constitutional:      Appearance: She is obese. She is not ill-appearing.     Comments: Appears older than stated age.  HENT:     Head: Normocephalic and atraumatic.  Eyes:     Extraocular Movements: Extraocular movements intact.     Conjunctiva/sclera: Conjunctivae normal.     Pupils: Pupils are equal, round, and reactive to light.  Neck:     Musculoskeletal: Neck supple.  Cardiovascular:     Rate and Rhythm: Normal rate and regular rhythm.  Pulmonary:     Effort: Pulmonary effort is normal.     Breath sounds: Normal breath sounds.  Abdominal:     Palpations: Abdomen is soft.     Tenderness: There is no abdominal tenderness.  Musculoskeletal:     Comments: No C, T, or L midline spinal tenderness.  Positive tenderness to right trapezius muscle with muscle stiffness.  Patient also has tenderness to the glenohumeral joint on the right.  Active and passive range of motion limited due to pain.  Positive Hawkins and empty can test.  Strength 4 out of 5 compared to left.  Sensation intact throughout.  Good distal pulses.  Skin:    General: Skin is warm and dry.  Neurological:     Mental Status: She is alert.     Comments: CN 3-12 grossly intact A&O x4 GCS 15 Sensation and strength intact Gait nonataxic including with tandem walking Coordination with finger-to-nose WNL Neg romberg, neg pronator drift       ED Treatments / Results  Labs (all labs ordered are listed, but only abnormal results are displayed) Labs Reviewed - No data to display  EKG None  Radiology Dg Shoulder Right  Result Date: 03/10/2019 CLINICAL DATA:  Right  shoulder pain, heard pop EXAM: RIGHT SHOULDER - 2+ VIEW COMPARISON:  09/13/2018 FINDINGS: There is no evidence of fracture or dislocation. There is no evidence of arthropathy or other focal bone abnormality. Soft tissues are unremarkable. IMPRESSION: Negative. Electronically Signed   By: Rolm Baptise M.D.   On: 03/10/2019 15:44    Procedures  Procedures (including critical care time)  Medications Ordered in ED Medications  HYDROcodone-acetaminophen (NORCO/VICODIN) 5-325 MG per tablet 1 tablet (1 tablet Oral Given 03/10/19 1559)     Initial Impression / Assessment and Plan / ED Course  I have reviewed the triage vital signs and the nursing notes.  Pertinent labs & imaging results that were available during my care of the patient were reviewed by me and considered in my medical decision making (see chart for details).    43 year old female who presents to the ED with sudden onset right shoulder pain after playing with her children last night.  Patient has history of rotator cuff repair done in February 2020 by Dr. Aline Brochure.  States that she had an appointment with him on 31 August to be cleared.  She has been doing physical therapy and has met all of her goals.  Felt an immediate pop last night after playing with children.  Called Dr. Ruthe Mannan office this morning but he is out of the office.  Advised to come to the ED for further evaluation.  She also endorses a headache today.  Has a history of migraine headaches and states this feels similar.  No focal neuro deficits on exam.  Do not suspect any kind of vascular injury today.  Patient does have tenderness to the glenohumeral joint.  Her range of motion is limited due to pain.  Positive Hawkins and empty can test.  Suspect she probably injured her rotator cuff again.  Will obtain x-ray today although will likely be negative given I do not suspect bony abnormality.  Patient will likely need to follow-up with Dr. Ruthe Mannan office again.  Will  reevaluate once x-ray returns  X-ray negative at this time.  Patient requesting pain medication.  PMD P reviewed.  It appears patient has been getting short courses of Norco from Dr. Aline Brochure.  Will give tablet in the ED today and sent home with a very short course.  Will give sling for comfort.  Patient will need to follow-up with Dr. Ruthe Mannan office.  It is in agreement with plan today.       Final Clinical Impressions(s) / ED Diagnoses   Final diagnoses:  Acute pain of right shoulder    ED Discharge Orders         Ordered    HYDROcodone-acetaminophen (NORCO/VICODIN) 5-325 MG tablet  Every 6 hours PRN     03/10/19 1558           Eustaquio Maize, PA-C 03/10/19 2204    Noemi Chapel, MD 03/14/19 [phone]

## 2019-03-10 NOTE — ED Notes (Signed)
Pt report "pop" to her neck  Rotator cuff repair earlier this year   Pain to her shoulder and neck  Here for evaluation

## 2019-03-10 NOTE — ED Notes (Signed)
To rad 

## 2019-03-10 NOTE — ED Notes (Signed)
From Rad 

## 2019-03-10 NOTE — ED Notes (Signed)
Pt reports she is followed by Dr Aline Brochure,   Called the office this am  He is out of the office and Dr Luna Glasgow has no openings  Here for eval

## 2019-03-19 ENCOUNTER — Ambulatory Visit: Payer: BLUE CROSS/BLUE SHIELD | Admitting: Orthopedic Surgery

## 2019-03-24 ENCOUNTER — Other Ambulatory Visit: Payer: Self-pay

## 2019-03-24 ENCOUNTER — Ambulatory Visit (INDEPENDENT_AMBULATORY_CARE_PROVIDER_SITE_OTHER): Payer: BLUE CROSS/BLUE SHIELD | Admitting: Orthopedic Surgery

## 2019-03-24 ENCOUNTER — Ambulatory Visit: Payer: BLUE CROSS/BLUE SHIELD

## 2019-03-24 VITALS — BP 144/80 | HR 88 | Temp 98.4°F | Ht 68.0 in | Wt 237.0 lb

## 2019-03-24 DIAGNOSIS — M542 Cervicalgia: Secondary | ICD-10-CM

## 2019-03-24 MED ORDER — HYDROCODONE-ACETAMINOPHEN 5-325 MG PO TABS: 1.0000 | ORAL_TABLET | Freq: Four times a day (QID) | ORAL | 0 refills | Status: AC | PRN

## 2019-03-24 MED ORDER — TIZANIDINE HCL 4 MG PO CAPS: 4.0000 mg | ORAL_CAPSULE | Freq: Three times a day (TID) | ORAL | 1 refills | Status: DC

## 2019-03-24 MED ORDER — IBUPROFEN 800 MG PO TABS: 800.0000 mg | ORAL_TABLET | Freq: Three times a day (TID) | ORAL | 0 refills | Status: DC | PRN

## 2019-03-24 NOTE — Progress Notes (Signed)
Tina Pham  03/24/2019  HISTORY SECTION :  Chief Complaint  Patient presents with  . Follow-up    Recheck on right shoulder.   HPI Status post right rotator cuff repair February 2020.  Patient says 2 weeks ago she was playing with 1 of her children or grandchildren and felt a pop in her right shoulder and neck area.  She had x-ray of the shoulder in the ER was negative  She has pain in the upper trapezius muscle cervical spine some loss of motion in the shoulder but no weakness.  She rates the pain in the moderate range and has not been relieved by ibuprofen seems to be getting better.  Pain seems to be dull ache   Review of Systems  Constitutional: Negative for fever.  Skin: Negative for rash.  Neurological: Negative for tingling.     Past Medical History:  Diagnosis Date  . Anxiety   . Arthritis   . Chronic back pain   . Chronic bronchitis ([place])   . Depression   . Diabetes mellitus    lost weight and is not on meds now.  . High cholesterol   . History of kidney stones   . Hypertension   . Migraine   . Neuropathic pain of foot   . Plantar fasciitis   . Sciatica   . Torn rotator cuff     Past Surgical History:  Procedure Laterality Date  . BACK SURGERY     herniated disc; had disc removed and then fusion; total of 3 surgeries  . EYE SURGERY Bilateral    removal of cyst and straightening of muscles  . KNEE ARTHROSCOPY WITH MEDIAL MENISECTOMY Left 12/27/2016   Procedure: LEFT KNEE DIAGNOSTIC ARTHROSCOPY;  Surgeon: Carole Civil, MD;  Location: [facility];  Service: Orthopedics;  Laterality: Left;  . SHOULDER OPEN ROTATOR CUFF REPAIR Right 09/19/2018   Procedure: ROTATOR CUFF REPAIR SHOULDER OPEN;  Surgeon: Carole Civil, MD;  Location: [facility];  Service: Orthopedics;  Laterality: Right;  . TUBAL LIGATION       Allergies  Allergen Reactions  . Codeine Other (See Comments)    Upset Stomach  . Benadryl [Diphenhydramine Hcl] Palpitations  .  Compazine Palpitations     Current Outpatient Medications:  .  ibuprofen (ADVIL) 200 MG tablet, Take 200 mg by mouth every 6 (six) hours as needed., Disp: , Rfl:  .  HYDROcodone-acetaminophen (NORCO/VICODIN) 5-325 MG tablet, Take 1 tablet by mouth every 6 (six) hours as needed for severe pain. (Patient not taking: Reported on 03/24/2019), Disp: 8 tablet, Rfl: 0   PHYSICAL EXAM SECTION: 1) BP (!) 144/80   Pulse 88   Temp 98.4 F (36.9 C)   Ht 5\' 8"  (1.727 m)   Wt 237 lb (107.5 kg)   LMP 02/22/2019   BMI 36.04 kg/m   Body mass index is 36.04 kg/m. General appearance: Well-developed well-nourished no gross deformities  2) Cardiovascular normal pulse and perfusion in the upper extremities normal color without edema  3) Neurologically deep tendon reflexes are equal and normal, no sensation loss or deficits no pathologic reflexes  4) Psychological: Awake alert and oriented x3 mood and affect normal  5) Skin no lacerations or ulcerations no nodularity no palpable masses, no erythema or nodularity  6) Musculoskeletal:   Tenderness in her cervical spine in the right trapezius muscle this is tight and spasmodic  The right shoulder has normal active motion normal passive motion normal drop test good strength in  the rotator cuff incision looks clean there is no swelling   MEDICAL DECISION SECTION:  Encounter Diagnosis  Name Primary?  . Neck pain Yes    Imaging C-spine x-ray was taken in the office  The shoulder x-ray was 3 views which was normal  Plan:  (Rx., Inj., surg., Frx, MRI/CT, XR:2)  Seems to be strain recommend the following medications  Meds ordered this encounter  Medications  . HYDROcodone-acetaminophen (NORCO/VICODIN) 5-325 MG tablet    Sig: Take 1 tablet by mouth every 6 (six) hours as needed for up to 7 days for severe pain.    Dispense:  28 tablet    Refill:  0  . ibuprofen (ADVIL) 800 MG tablet    Sig: Take 1 tablet (800 mg total) by mouth every 8  (eight) hours as needed.    Dispense:  30 tablet    Refill:  0  . tiZANidine (ZANAFLEX) 4 MG capsule    Sig: Take 1 capsule (4 mg total) by mouth 3 (three) times daily.    Dispense:  30 capsule    Refill:  1     Follow-up PRN  11:54 AM Fuller CanadaStanley Reshunda Strider, MD  03/24/2019

## 2019-03-29 ENCOUNTER — Other Ambulatory Visit: Payer: Self-pay

## 2019-03-29 DIAGNOSIS — M542 Cervicalgia: Secondary | ICD-10-CM

## 2019-03-31 ENCOUNTER — Other Ambulatory Visit: Payer: Self-pay

## 2019-03-31 ENCOUNTER — Other Ambulatory Visit: Payer: Self-pay | Admitting: Orthopedic Surgery

## 2019-03-31 DIAGNOSIS — M542 Cervicalgia: Secondary | ICD-10-CM

## 2019-04-01 ENCOUNTER — Other Ambulatory Visit: Payer: Self-pay | Admitting: Orthopedic Surgery

## 2019-04-01 DIAGNOSIS — M542 Cervicalgia: Secondary | ICD-10-CM

## 2019-04-01 MED ORDER — CYCLOBENZAPRINE HCL 5 MG PO TABS: 5.0000 mg | ORAL_TABLET | Freq: Three times a day (TID) | ORAL | 1 refills | Status: DC | PRN

## 2019-04-01 NOTE — Progress Notes (Unsigned)
flexeril

## 2019-04-08 ENCOUNTER — Other Ambulatory Visit: Payer: Self-pay | Admitting: Orthopedic Surgery

## 2019-04-08 DIAGNOSIS — M542 Cervicalgia: Secondary | ICD-10-CM

## 2019-07-16 ENCOUNTER — Encounter: Payer: Self-pay | Admitting: Orthopedic Surgery

## 2019-07-16 ENCOUNTER — Other Ambulatory Visit: Payer: Self-pay

## 2019-07-16 ENCOUNTER — Ambulatory Visit: Payer: BLUE CROSS/BLUE SHIELD | Admitting: Orthopedic Surgery

## 2019-07-16 VITALS — BP 154/77 | HR 106 | Ht 68.0 in | Wt 238.0 lb

## 2019-07-16 DIAGNOSIS — M25511 Pain in right shoulder: Secondary | ICD-10-CM | POA: Diagnosis not present

## 2019-07-16 DIAGNOSIS — Z9889 Other specified postprocedural states: Secondary | ICD-10-CM | POA: Diagnosis not present

## 2019-07-16 MED ORDER — HYDROCODONE-ACETAMINOPHEN 7.5-325 MG PO TABS: 1.0000 | ORAL_TABLET | Freq: Three times a day (TID) | ORAL | 0 refills | Status: DC | PRN

## 2019-07-16 NOTE — Patient Instructions (Addendum)
REST   NO LIFTING WITH RIGHT ARM please wear the sling over the next 2 weeks   WORK NO LIFTING FOR NEXT 2 WEEKS

## 2019-07-16 NOTE — Progress Notes (Signed)
  New problem  Chief Complaint  Patient presents with  . Shoulder Pain    right shoulder pain trying to catch someone who was falling 07/05/2019    Dionne Milo had an uncomplicated right rotator cuff repair back in February finished her rehab did well went back to work and normal employment  On December 12 she was off the clock but a client fell onto her she tried to catch her they both landed on the patient's right side she complains of severe right shoulder pain went to the emergency room in [place] x-rays were normal but she has noticed severe pain in the right shoulder she can no longer lift her arm more than about 45 degrees  Review of Systems  Constitutional: Negative for chills and fever.  Neurological: Negative for tingling and sensory change.   Past Medical History:  Diagnosis Date  . Anxiety   . Arthritis   . Chronic back pain   . Chronic bronchitis ([place])   . Depression   . Diabetes mellitus    lost weight and is not on meds now.  . High cholesterol   . History of kidney stones   . Hypertension   . Migraine   . Neuropathic pain of foot   . Plantar fasciitis   . Sciatica   . Torn rotator cuff    BP (!) 154/77   Pulse (!) 106   Ht 5\' 8"  (1.727 m)   Wt 238 lb (108 kg)   BMI 36.19 kg/m   Physical Exam Vitals and nursing note reviewed.  Constitutional:      Appearance: Normal appearance.  Musculoskeletal:     Right shoulder: Tenderness and crepitus present. No swelling, deformity, effusion or laceration. Decreased range of motion. Decreased strength. Normal pulse.     Left shoulder: Normal. No tenderness. Normal range of motion. Normal strength. Normal pulse.     Comments: Stable left shoulder, skin normal neurovascular exam intact  Right shoulder active flexion less than 45 degrees passive external rotation with her arm at her side is 45 degrees abduction 40 degrees and flexion 40 degrees severely painful.  We cannot perform the drop test the patient cannot get her arm  up to that position  We cannot do the empty can sign patient was in too much pain    Neurological:     Mental Status: She is alert and oriented to person, place, and time.  Psychiatric:        Mood and Affect: Mood normal.    Outside imaging I reviewed myself x-rays are negative for fracture dislocation or bony glenoid erosion  Impression recurrent rotator cuff tear  MRI with arthrogram to evaluate integrity of rotator cuff repair  Patient is to continue with sling rest hydrocodone for pain  Meds ordered this encounter  Medications  . HYDROcodone-acetaminophen (NORCO) 7.5-325 MG tablet    Sig: Take 1 tablet by mouth every 8 (eight) hours as needed for moderate pain.    Dispense:  42 tablet    Refill:  0   Follow-up in the office

## 2019-07-27 ENCOUNTER — Other Ambulatory Visit: Payer: Self-pay

## 2019-07-27 DIAGNOSIS — M542 Cervicalgia: Secondary | ICD-10-CM

## 2019-07-28 ENCOUNTER — Other Ambulatory Visit: Payer: Self-pay

## 2019-07-28 ENCOUNTER — Ambulatory Visit (HOSPITAL_COMMUNITY)
Admission: RE | Admit: 2019-07-28 | Discharge: 2019-07-28 | Disposition: A | Discharge status: Home / Self Care | Payer: BLUE CROSS/BLUE SHIELD | Source: Ambulatory Visit | Attending: Orthopedic Surgery | Admitting: Orthopedic Surgery

## 2019-07-28 ENCOUNTER — Encounter (HOSPITAL_COMMUNITY): Payer: Self-pay

## 2019-07-28 ENCOUNTER — Other Ambulatory Visit: Payer: Self-pay | Admitting: Orthopedic Surgery

## 2019-07-28 DIAGNOSIS — Z9889 Other specified postprocedural states: Secondary | ICD-10-CM

## 2019-07-28 DIAGNOSIS — M75121 Complete rotator cuff tear or rupture of right shoulder, not specified as traumatic: Secondary | ICD-10-CM | POA: Diagnosis not present

## 2019-07-28 DIAGNOSIS — M542 Cervicalgia: Secondary | ICD-10-CM

## 2019-07-28 MED ORDER — LIDOCAINE HCL (PF) 1 % IJ SOLN
INTRAMUSCULAR | Status: AC
  Administered 2019-07-28: 12:00:00 5 mL
  Filled 2019-07-28: qty 10

## 2019-07-28 MED ORDER — HYDROCODONE-ACETAMINOPHEN 5-325 MG PO TABS: 1.0000 | ORAL_TABLET | Freq: Four times a day (QID) | ORAL | 0 refills | Status: DC | PRN

## 2019-07-28 MED ORDER — POVIDONE-IODINE 10 % EX SOLN
CUTANEOUS | Status: AC
  Filled 2019-07-28: qty 15

## 2019-07-28 MED ORDER — IBUPROFEN 800 MG PO TABS: ORAL_TABLET | ORAL | 0 refills | Status: DC

## 2019-07-28 MED ORDER — GADOBUTROL 1 MMOL/ML IV SOLN
2.0000 mL | Freq: Once | INTRAVENOUS | Status: AC | PRN
  Administered 2019-07-28: 12:00:00 0.5 mL

## 2019-07-28 MED ORDER — IOHEXOL 300 MG/ML  SOLN
50.0000 mL | Freq: Once | INTRAMUSCULAR | Status: AC | PRN
  Administered 2019-07-28: 20 mL

## 2019-07-28 NOTE — Procedures (Signed)
Preprocedure Dx: Chronic RIGHT shoulder pain Postprocedure Dx: Chronic RIGHT shoulder pain Procedure  Fluoroscopically guided RT shoulder joint injection for MR arthrogrpahy Radiologist:  Tyron Russell Anesthesia:  5 ml of 1% lidocaine Injectate:  8 ml of (0.05 ml  Multihance in 27ml [id number]) Fluoro time:  0 minutes 54 seconds EBL:   None Complications: None

## 2019-07-30 ENCOUNTER — Telehealth: Payer: Self-pay | Admitting: Orthopedic Surgery

## 2019-07-30 ENCOUNTER — Telehealth: Payer: Self-pay | Admitting: Radiology

## 2019-07-30 ENCOUNTER — Other Ambulatory Visit: Payer: Self-pay | Admitting: Orthopedic Surgery

## 2019-07-30 NOTE — Telephone Encounter (Signed)
-----   Message from Vickki Hearing, MD sent at 07/30/2019  1:18 PM EST ----- Set her up for a repair rotator cuff right shoulder Arthrex suture anchors speed bridge neededOpen repair

## 2019-07-30 NOTE — Telephone Encounter (Signed)
-----   Message from Stanley E Harrison, MD sent at 07/30/2019  1:18 PM EST ----- Set her up for a repair rotator cuff right shoulder Arthrex suture anchors speed bridge neededOpen repair   

## 2019-07-30 NOTE — Telephone Encounter (Signed)
Talk to Tina Pham about her situation she has retorn her rotator cuff will need a new repair this repair will be a little difficult because of their recurrence anchor placement etc. patient agreeable to surgery

## 2019-07-30 NOTE — Progress Notes (Signed)
Set her up for a repair rotator cuff right shoulder Arthrex suture anchors speed bridge neededOpen repair

## 2019-07-30 NOTE — Telephone Encounter (Signed)
Set up for Tuesday 12th.

## 2019-07-31 NOTE — Patient Instructions (Signed)
Tina Pham  07/31/2019     @PREFPERIOPPHARMACY @   Your procedure is scheduled on Tuesday, 08/05/19.  Report to 10/03/19 at [phone] A.M.  Call this number if you have problems the morning of surgery:  [phone]   Remember:  Do not eat or drink after midnight.     Take these medicines the morning of surgery with A SIP OF WATER norco if needed    Do not wear jewelry, make-up or nail polish.  Do not wear lotions, powders, or perfumes, or deodorant.  Do not shave 48 hours prior to surgery.  Men may shave face and neck.  Do not bring valuables to the hospital.  [facility] is not responsible for any belongings or valuables.  Contacts, dentures or bridgework may not be worn into surgery.  Leave your suitcase in the car.  After surgery it may be brought to your room.  For patients admitted to the hospital, discharge time will be determined by your treatment team.  Patients discharged the day of surgery will not be allowed to drive home.   Name and phone number of your driver:   Family   Please read over the following fact sheets that you were given. Pain Booklet, Coughing and Deep Breathing, Anesthesia Post-op Instructions and Care and Recovery After Surgery      Surgery for Rotator Cuff Tear, Care After This sheet gives you information about how to care for yourself after your procedure. Your health care provider may also give you more specific instructions. If you have problems or questions, contact your health care provider. What can I expect after the procedure? After the procedure, it is common to have:  Swelling.  Pain.  Stiffness.  Tenderness. Follow these instructions at home: If you have a sling or a shoulder immobilizer:  Wear it as told by your health care provider. Remove it only as told by your health care provider.  Loosen it if your fingers tingle, become numb, or turn cold and blue.  Keep it clean. Bathing  Do not take baths, swim, or use  a hot tub until your health care provider approves. Ask your health care provider if you may take showers. You may only be allowed to take sponge baths.  Keep your bandage (dressing) dry until your health care provider says it can be removed.  If your sling or shoulder immobilizer is not waterproof: ? Do not let it get wet. ? Remove it when you take a bath or shower as told by your health care provider. Once the sling or shoulder immobilizer is removed, try not to move your shoulder until your health care provider says that you can. Incision care   Follow instructions from your health care provider about how to take care of your incision. Make sure you: ? Wash your hands with soap and water before and after you change your dressing. If soap and water are not available, use hand sanitizer. ? Change your dressing as told by your health care provider. ? Leave stitches (sutures), skin glue, or adhesive strips in place. These skin closures may need to stay in place for 2 weeks or longer. If adhesive strip edges start to loosen and curl up, you may trim the loose edges. Do not remove adhesive strips completely unless your health care provider tells you to do that.  Check your incision area every day for signs of infection. Check for: ? More redness, swelling, or pain. ? More fluid or blood. ?  Warmth. ? Pus or a bad smell. Managing pain, stiffness, and swelling   If directed, put ice on your shoulder area. ? Put ice in a plastic bag. ? Place a towel between your skin and the bag. ? Leave the ice on for 20 minutes, 2-3 times a day.  Move your fingers often to reduce stiffness and swelling.  Raise (elevate) your upper body on pillows when you lie down and when you sleep. ? Do not sleep on the front of your body (abdomen). ? Do not sleep on the side that your surgery was performed on. Medicines  Take over-the-counter and prescription medicines only as told by your health care  provider.  Ask your health care provider if the medicine prescribed to you: ? Requires you to avoid driving or using heavy machinery. ? Can cause constipation. You may need to take actions to prevent or treat constipation, such as:  Drink enough fluid to keep your urine pale yellow.  Take over-the-counter or prescription medicines.  Eat foods that are high in fiber, such as beans, whole grains, and fresh fruits and vegetables.  Limit foods that are high in fat and processed sugars, such as fried or sweet foods. Driving  Do not drive for 24 hours if you were given a sedative during your procedure.  Do not drive while wearing a sling or a shoulder immobilizer. Ask your health care provider when it is safe to drive. Activity  Do not use your arm to support your body weight until your health care provider says that you can.  Do not lift or hold anything with your arm until your health care provider approves.  Return to your normal activities as told by your health care provider. Ask your health care provider what activities are safe for you.  Do exercises as told by your health care provider. General instructions  Do not use any products that contain nicotine or tobacco, such as cigarettes, e-cigarettes, and chewing tobacco. These can delay healing after surgery. If you need help quitting, ask your health care provider.  Keep all follow-up visits as told by your health care provider. This is important. Contact a health care provider if:  You have a fever.  You have more redness, swelling, or pain around your incision.  You have more fluid or blood coming from your incision.  Your incision feels warm to the touch.  You have pus or a bad smell coming from your incision.  You have pain that gets worse or does not get better with medicine. Get help right away if:  You have severe pain.  You lose feeling in your arm or hand.  Your hand or fingers turn very pale or  blue. Summary  If you have a sling, wear it as told by your health care provider. Remove it only as told by your health care provider.  Change your dressing as told by your health care provider. Check the incision area every day for signs of infection.  If directed, put ice on your shoulder area 2-3 times a day.  Do not use your arm to lift anything or to support your body weight until your health care provider says that you can. This information is not intended to replace advice given to you by your health care provider. Make sure you discuss any questions you have with your health care provider. Document Revised: 04/15/2018 Document Reviewed: 04/17/2018 Elsevier Patient Education  2020 [employer] Anesthesia, Adult, Care After This  sheet gives you information about how to care for yourself after your procedure. Your health care provider may also give you more specific instructions. If you have problems or questions, contact your health care provider. What can I expect after the procedure? After the procedure, the following side effects are common:  Pain or discomfort at the IV site.  Nausea.  Vomiting.  Sore throat.  Trouble concentrating.  Feeling cold or chills.  Weak or tired.  Sleepiness and fatigue.  Soreness and body aches. These side effects can affect parts of the body that were not involved in surgery. Follow these instructions at home:  For at least 24 hours after the procedure:  Have a responsible adult stay with you. It is important to have someone help care for you until you are awake and alert.  Rest as needed.  Do not: ? Participate in activities in which you could fall or become injured. ? Drive. ? Use heavy machinery. ? Drink alcohol. ? Take sleeping pills or medicines that cause drowsiness. ? Make important decisions or sign legal documents. ? Take care of children on your own. Eating and drinking  Follow any instructions from  your health care provider about eating or drinking restrictions.  When you feel hungry, start by eating small amounts of foods that are soft and easy to digest (bland), such as toast. Gradually return to your regular diet.  Drink enough fluid to keep your urine pale yellow.  If you vomit, rehydrate by drinking water, juice, or clear broth. General instructions  If you have sleep apnea, surgery and certain medicines can increase your risk for breathing problems. Follow instructions from your health care provider about wearing your sleep device: ? Anytime you are sleeping, including during daytime naps. ? While taking prescription pain medicines, sleeping medicines, or medicines that make you drowsy.  Return to your normal activities as told by your health care provider. Ask your health care provider what activities are safe for you.  Take over-the-counter and prescription medicines only as told by your health care provider.  If you smoke, do not smoke without supervision.  Keep all follow-up visits as told by your health care provider. This is important. Contact a health care provider if:  You have nausea or vomiting that does not get better with medicine.  You cannot eat or drink without vomiting.  You have pain that does not get better with medicine.  You are unable to pass urine.  You develop a skin rash.  You have a fever.  You have redness around your IV site that gets worse. Get help right away if:  You have difficulty breathing.  You have chest pain.  You have blood in your urine or stool, or you vomit blood. Summary  After the procedure, it is common to have a sore throat or nausea. It is also common to feel tired.  Have a responsible adult stay with you for the first 24 hours after general anesthesia. It is important to have someone help care for you until you are awake and alert.  When you feel hungry, start by eating small amounts of foods that are soft and  easy to digest (bland), such as toast. Gradually return to your regular diet.  Drink enough fluid to keep your urine pale yellow.  Return to your normal activities as told by your health care provider. Ask your health care provider what activities are safe for you. This information is not intended to replace advice given  to you by your health care provider. Make sure you discuss any questions you have with your health care provider. Document Revised: 07/13/2017 Document Reviewed: 02/23/2017 Elsevier Patient Education  2020 [employer]  How to Use Chlorhexidine for Bathing Chlorhexidine gluconate (CHG) is a germ-killing (antiseptic) solution that is used to clean the skin. It can get rid of the bacteria that normally live on the skin and can keep them away for about 24 hours. To clean your skin with CHG, you may be given: A CHG solution to use in the shower or as part of a sponge bath. A prepackaged cloth that contains CHG. Cleaning your skin with CHG may help lower the risk for infection: While you are staying in the intensive care unit of the hospital. If you have a vascular access, such as a central line, to provide short-term or long-term access to your veins. If you have a catheter to drain urine from your bladder. If you are on a ventilator. A ventilator is a machine that helps you breathe by moving air in and out of your lungs. After surgery. What are the risks? Risks of using CHG include: A skin reaction. Hearing loss, if CHG gets in your ears. Eye injury, if CHG gets in your eyes and is not rinsed out. The CHG product catching fire. Make sure that you avoid smoking and flames after applying CHG to your skin. Do not use CHG: If you have a chlorhexidine allergy or have previously reacted to chlorhexidine. On babies younger than 672 months of age. How to use CHG solution Use CHG only as told by your health care provider, and follow the instructions on the label. Use the full  amount of CHG as directed. Usually, this is one bottle. During a shower Follow these steps when using CHG solution during a shower (unless your health care provider gives you different instructions): Start the shower. Use your normal soap and shampoo to wash your face and hair. Turn off the shower or move out of the shower stream. Pour the CHG onto a clean washcloth. Do not use any type of brush or rough-edged sponge. Starting at your neck, lather your body down to your toes. Make sure you follow these instructions: If you will be having surgery, pay special attention to the part of your body where you will be having surgery. Scrub this area for at least 1 minute. Do not use CHG on your head or face. If the solution gets into your ears or eyes, rinse them well with water. Avoid your genital area. Avoid any areas of skin that have broken skin, cuts, or scrapes. Scrub your back and under your arms. Make sure to wash skin folds. Let the lather sit on your skin for 1-2 minutes or as long as told by your health care provider. Thoroughly rinse your entire body in the shower. Make sure that all body creases and crevices are rinsed well. Dry off with a clean towel. Do not put any substances on your body afterward--such as powder, lotion, or perfume--unless you are told to do so by your health care provider. Only use lotions that are recommended by the manufacturer. Put on clean clothes or pajamas. If it is the night before your surgery, sleep in clean sheets.  During a sponge bath Follow these steps when using CHG solution during a sponge bath (unless your health care provider gives you different instructions): Use your normal soap and shampoo to wash your face and hair. Pour the  CHG onto a clean washcloth. Starting at your neck, lather your body down to your toes. Make sure you follow these instructions: If you will be having surgery, pay special attention to the part of your body where you will be  having surgery. Scrub this area for at least 1 minute. Do not use CHG on your head or face. If the solution gets into your ears or eyes, rinse them well with water. Avoid your genital area. Avoid any areas of skin that have broken skin, cuts, or scrapes. Scrub your back and under your arms. Make sure to wash skin folds. Let the lather sit on your skin for 1-2 minutes or as long as told by your health care provider. Using a different clean, wet washcloth, thoroughly rinse your entire body. Make sure that all body creases and crevices are rinsed well. Dry off with a clean towel. Do not put any substances on your body afterward--such as powder, lotion, or perfume--unless you are told to do so by your health care provider. Only use lotions that are recommended by the manufacturer. Put on clean clothes or pajamas. If it is the night before your surgery, sleep in clean sheets. How to use CHG prepackaged cloths Only use CHG cloths as told by your health care provider, and follow the instructions on the label. Use the CHG cloth on clean, dry skin. Do not use the CHG cloth on your head or face unless your health care provider tells you to. When washing with the CHG cloth: Avoid your genital area. Avoid any areas of skin that have broken skin, cuts, or scrapes. Before surgery Follow these steps when using a CHG cloth to clean before surgery (unless your health care provider gives you different instructions): Using the CHG cloth, vigorously scrub the part of your body where you will be having surgery. Scrub using a back-and-forth motion for 3 minutes. The area on your body should be completely wet with CHG when you are done scrubbing. Do not rinse. Discard the cloth and let the area air-dry. Do not put any substances on the area afterward, such as powder, lotion, or perfume. Put on clean clothes or pajamas. If it is the night before your surgery, sleep in clean sheets.  For general bathing Follow these  steps when using CHG cloths for general bathing (unless your health care provider gives you different instructions). Use a separate CHG cloth for each area of your body. Make sure you wash between any folds of skin and between your fingers and toes. Wash your body in the following order, switching to a new cloth after each step: The front of your neck, shoulders, and chest. Both of your arms, under your arms, and your hands. Your stomach and groin area, avoiding the genitals. Your right leg and foot. Your left leg and foot. The back of your neck, your back, and your buttocks. Do not rinse. Discard the cloth and let the area air-dry. Do not put any substances on your body afterward--such as powder, lotion, or perfume--unless you are told to do so by your health care provider. Only use lotions that are recommended by the manufacturer. Put on clean clothes or pajamas. Contact a health care provider if: Your skin gets irritated after scrubbing. You have questions about using your solution or cloth. Get help right away if: Your eyes become very red or swollen. Your eyes itch badly. Your skin itches badly and is red or swollen. Your hearing changes. You have trouble seeing.  You have swelling or tingling in your mouth or throat. You have trouble breathing. You swallow any chlorhexidine. Summary Chlorhexidine gluconate (CHG) is a germ-killing (antiseptic) solution that is used to clean the skin. Cleaning your skin with CHG may help to lower your risk for infection. You may be given CHG to use for bathing. It may be in a bottle or in a prepackaged cloth to use on your skin. Carefully follow your health care provider's instructions and the instructions on the product label. Do not use CHG if you have a chlorhexidine allergy. Contact your health care provider if your skin gets irritated after scrubbing. This information is not intended to replace advice given to you by your health care provider. Make  sure you discuss any questions you have with your health care provider. Document Revised: 09/26/2018 Document Reviewed: 06/07/2017 Elsevier Patient Education  2020 [employer].

## 2019-08-01 ENCOUNTER — Other Ambulatory Visit: Payer: Self-pay

## 2019-08-01 ENCOUNTER — Encounter (HOSPITAL_COMMUNITY)
Admission: RE | Admit: 2019-08-01 | Discharge: 2019-08-01 | Disposition: A | Discharge status: Home / Self Care | Payer: BLUE CROSS/BLUE SHIELD | Source: Ambulatory Visit | Attending: Orthopedic Surgery | Admitting: Orthopedic Surgery

## 2019-08-01 ENCOUNTER — Encounter (HOSPITAL_COMMUNITY): Payer: Self-pay

## 2019-08-01 DIAGNOSIS — Z01812 Encounter for preprocedural laboratory examination: Secondary | ICD-10-CM | POA: Diagnosis not present

## 2019-08-01 LAB — CBC WITH DIFFERENTIAL/PLATELET
Abs Immature Granulocytes: 0.03 10*3/uL (ref 0.00–0.07)
Basophils Absolute: 0.1 10*3/uL (ref 0.0–0.1)
Basophils Relative: 1 %
Eosinophils Absolute: 0.1 10*3/uL (ref 0.0–0.5)
Eosinophils Relative: 1 %
HCT: 31.1 % — ABNORMAL LOW (ref 36.0–46.0)
Hemoglobin: 8.5 g/dL — ABNORMAL LOW (ref 12.0–15.0)
Immature Granulocytes: 0 %
Lymphocytes Relative: 23 %
Lymphs Abs: 1.7 10*3/uL (ref 0.7–4.0)
MCH: 20 pg — ABNORMAL LOW (ref 26.0–34.0)
MCHC: 27.3 g/dL — ABNORMAL LOW (ref 30.0–36.0)
MCV: 73.3 fL — ABNORMAL LOW (ref 80.0–100.0)
Monocytes Absolute: 0.3 10*3/uL (ref 0.1–1.0)
Monocytes Relative: 4 %
Neutro Abs: 5 10*3/uL (ref 1.7–7.7)
Neutrophils Relative %: 71 %
Platelets: 521 10*3/uL — ABNORMAL HIGH (ref 150–400)
RBC: 4.24 MIL/uL (ref 3.87–5.11)
RDW: 17.1 % — ABNORMAL HIGH (ref 11.5–15.5)
WBC: 7.1 10*3/uL (ref 4.0–10.5)
nRBC: 0 % (ref 0.0–0.2)

## 2019-08-01 LAB — BASIC METABOLIC PANEL
Anion gap: 8 (ref 5–15)
BUN: 9 mg/dL (ref 6–20)
CO2: 24 mmol/L (ref 22–32)
Calcium: 8.4 mg/dL — ABNORMAL LOW (ref 8.9–10.3)
Chloride: 105 mmol/L (ref 98–111)
Creatinine, Ser: 0.49 mg/dL (ref 0.44–1.00)
GFR calc Af Amer: >60 mL/min (ref >60)
GFR calc non Af Amer: >60 mL/min (ref >60)
Glucose, Bld: 243 mg/dL — ABNORMAL HIGH (ref 70–99)
Potassium: 3.5 mmol/L (ref 3.5–5.1)
Sodium: 137 mmol/L (ref 135–145)

## 2019-08-01 LAB — HEMOGLOBIN A1C
Hgb A1c MFr Bld: 9.6 % — ABNORMAL HIGH (ref 4.8–5.6)
Mean Plasma Glucose: 228.82 mg/dL

## 2019-08-01 LAB — HCG, QUANTITATIVE, PREGNANCY: hCG, Beta Chain, Quant, S: <1 m[IU]/mL (ref <5)

## 2019-08-01 LAB — GLUCOSE, CAPILLARY: Glucose-Capillary: 237 mg/dL — ABNORMAL HIGH (ref 70–99)

## 2019-08-04 ENCOUNTER — Other Ambulatory Visit (HOSPITAL_COMMUNITY)
Admission: RE | Admit: 2019-08-04 | Discharge: 2019-08-04 | Disposition: A | Discharge status: Home / Self Care | Payer: BLUE CROSS/BLUE SHIELD | Source: Ambulatory Visit | Attending: Orthopedic Surgery | Admitting: Orthopedic Surgery

## 2019-08-04 ENCOUNTER — Other Ambulatory Visit: Payer: Self-pay

## 2019-08-04 DIAGNOSIS — Z20822 Contact with and (suspected) exposure to covid-19: Secondary | ICD-10-CM | POA: Diagnosis not present

## 2019-08-04 DIAGNOSIS — Z01812 Encounter for preprocedural laboratory examination: Secondary | ICD-10-CM | POA: Insufficient documentation

## 2019-08-04 LAB — SARS CORONAVIRUS 2 (TAT 6-24 HRS): SARS Coronavirus 2: NEGATIVE

## 2019-08-04 NOTE — H&P (Signed)
Chief Complaint  Patient presents with  . Shoulder Pain      right shoulder pain trying to catch someone who was falling 07/05/2019      Tina Pham had an uncomplicated right rotator cuff repair back in February finished her rehab did well went back to work and normal employment   On December 12 she was off the clock but a client fell onto her she tried to catch her they both landed on the patient's right side she complains of severe right shoulder pain went to the emergency room in [place] x-rays were normal but she has noticed severe pain in the right shoulder she can no longer lift her arm more than about 45 degrees.  Due to the nature of her complaint physical exam findings and pain she was sent for an MRI.  The MRI shows she has retorn the rotator cuff   Review of Systems  Constitutional: Negative for chills and fever.  Neurological: Negative for tingling and sensory change.   Past Surgical History:  Procedure Laterality Date  . BACK SURGERY     herniated disc; had disc removed and then fusion; total of 3 surgeries  . EYE SURGERY Bilateral    removal of cyst and straightening of muscles  . KNEE ARTHROSCOPY WITH MEDIAL MENISECTOMY Left 12/27/2016   Procedure: LEFT KNEE DIAGNOSTIC ARTHROSCOPY;  Surgeon: Carole Civil, MD;  Location: [facility];  Service: Orthopedics;  Laterality: Left;  . SHOULDER OPEN ROTATOR CUFF REPAIR Right 09/19/2018   Procedure: ROTATOR CUFF REPAIR SHOULDER OPEN;  Surgeon: Carole Civil, MD;  Location: [facility];  Service: Orthopedics;  Laterality: Right;  . TUBAL LIGATION      Family History  Problem Relation Age of Onset  . Heart failure Mother   . COPD Mother   . Diabetes Mother   . Heart failure Father   . Diabetes Other     Social History   Tobacco Use  . Smoking status: Current Every Day Smoker    Packs/day: 1.00    Years: 20.00    Pack years: 20.00    Types: Cigarettes  . Smokeless tobacco: Never Used  Substance Use Topics  . Alcohol use: No   . Drug use: No   No current facility-administered medications for this encounter.  Current Outpatient Medications:  .  HYDROcodone-acetaminophen (NORCO/VICODIN) 5-325 MG tablet, Take 1 tablet by mouth every 6 (six) hours as needed for moderate pain., Disp: 30 tablet, Rfl: 0 .  ibuprofen (ADVIL) 800 MG tablet, TAKE 1 TABLET(800 MG) BY MOUTH EVERY 8 HOURS AS NEEDED (Patient taking differently: Take 800 mg by mouth every 8 (eight) hours as needed (pain.). ), Disp: 30 tablet, Rfl: 0 .  acetaminophen (TYLENOL) 500 MG tablet, Take 500-1,000 mg by mouth every 6 (six) hours as needed (for pain.)., Disp: , Rfl:  .  HYDROcodone-acetaminophen (NORCO) 7.5-325 MG tablet, Take 1 tablet by mouth every 8 (eight) hours as needed for moderate pain. (Patient not taking: Reported on 07/30/2019), Disp: 42 tablet, Rfl: 0      Past Medical History:  Diagnosis Date  . Anxiety    . Arthritis    . Chronic back pain    . Chronic bronchitis ([place])    . Depression    . Diabetes mellitus      lost weight and is not on meds now.  . High cholesterol    . History of kidney stones    . Hypertension    . Migraine    .  Neuropathic pain of foot    . Plantar fasciitis    . Sciatica    . Torn rotator cuff      BP (!) 154/77   Pulse (!) 106   Ht 5\' 8"  (1.727 m)   Wt 238 lb (108 kg)   BMI 36.19 kg/m    Physical Exam Vitals and nursing note reviewed.  Constitutional:      Appearance: Normal appearance.  Musculoskeletal:     Right shoulder: Tenderness and crepitus present. No swelling, deformity, effusion or laceration. Decreased range of motion. Decreased strength. Normal pulse.     Left shoulder: Normal. No tenderness. Normal range of motion. Normal strength. Normal pulse.     Comments: Stable left shoulder, skin normal neurovascular exam intact  Right shoulder active flexion less than 45 degrees passive external rotation with her arm at her side is 45 degrees abduction 40 degrees and flexion 40 degrees severely  painful.  We cannot perform the drop test the patient cannot get her arm up to that position  We cannot do the empty can sign patient was in too much pain    Neurological:     Mental Status: She is alert and oriented to person, place, and time.  Psychiatric:        Mood and Affect: Mood normal.      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 Alternative treatment has also been reviewed, questions were encouraged and answered. The postoperative plan is also been reviewed.

## 2019-08-05 ENCOUNTER — Encounter (HOSPITAL_COMMUNITY)
Admission: RE | Disposition: A | Discharge status: Home / Self Care | Payer: Self-pay | Source: Home / Self Care | Attending: Orthopedic Surgery

## 2019-08-05 ENCOUNTER — Ambulatory Visit (HOSPITAL_COMMUNITY)
Admission: RE | Admit: 2019-08-05 | Discharge: 2019-08-05 | Disposition: A | Discharge status: Home / Self Care | Payer: BLUE CROSS/BLUE SHIELD | Attending: Orthopedic Surgery | Admitting: Orthopedic Surgery

## 2019-08-05 ENCOUNTER — Ambulatory Visit (HOSPITAL_COMMUNITY): Payer: BLUE CROSS/BLUE SHIELD | Admitting: Anesthesiology

## 2019-08-05 ENCOUNTER — Encounter (HOSPITAL_COMMUNITY): Payer: Self-pay | Admitting: Orthopedic Surgery

## 2019-08-05 DIAGNOSIS — S46011D Strain of muscle(s) and tendon(s) of the rotator cuff of right shoulder, subsequent encounter: Secondary | ICD-10-CM

## 2019-08-05 DIAGNOSIS — W03XXXA Other fall on same level due to collision with another person, initial encounter: Secondary | ICD-10-CM | POA: Diagnosis not present

## 2019-08-05 DIAGNOSIS — F17210 Nicotine dependence, cigarettes, uncomplicated: Secondary | ICD-10-CM | POA: Diagnosis not present

## 2019-08-05 DIAGNOSIS — E1165 Type 2 diabetes mellitus with hyperglycemia: Secondary | ICD-10-CM | POA: Insufficient documentation

## 2019-08-05 DIAGNOSIS — S46011A Strain of muscle(s) and tendon(s) of the rotator cuff of right shoulder, initial encounter: Secondary | ICD-10-CM | POA: Insufficient documentation

## 2019-08-05 DIAGNOSIS — I10 Essential (primary) hypertension: Secondary | ICD-10-CM | POA: Diagnosis not present

## 2019-08-05 HISTORY — PX: SHOULDER OPEN ROTATOR CUFF REPAIR: SHX2407

## 2019-08-05 LAB — GLUCOSE, CAPILLARY
Glucose-Capillary: 213 mg/dL — ABNORMAL HIGH (ref 70–99)
Glucose-Capillary: 279 mg/dL — ABNORMAL HIGH (ref 70–99)
Glucose-Capillary: 235 mg/dL — ABNORMAL HIGH (ref 70–99)

## 2019-08-05 SURGERY — REPAIR, ROTATOR CUFF, OPEN
Anesthesia: General | Site: Shoulder | Laterality: Right

## 2019-08-05 MED ORDER — PROPOFOL 10 MG/ML IV BOLUS
INTRAVENOUS | Status: AC
  Filled 2019-08-05: qty 20

## 2019-08-05 MED ORDER — SUCCINYLCHOLINE CHLORIDE 200 MG/10ML IV SOSY
PREFILLED_SYRINGE | INTRAVENOUS | Status: AC
  Filled 2019-08-05: qty 10

## 2019-08-05 MED ORDER — EPHEDRINE 5 MG/ML INJ
INTRAVENOUS | Status: AC
  Filled 2019-08-05: qty 10

## 2019-08-05 MED ORDER — FENTANYL CITRATE (PF) 250 MCG/5ML IJ SOLN
INTRAMUSCULAR | Status: AC
  Filled 2019-08-05: qty 5

## 2019-08-05 MED ORDER — 0.9 % SODIUM CHLORIDE (POUR BTL) OPTIME
TOPICAL | Status: DC | PRN
  Administered 2019-08-05: 1000 mL

## 2019-08-05 MED ORDER — BUPIVACAINE-EPINEPHRINE (PF) 0.25% -1:200000 IJ SOLN
INTRAMUSCULAR | Status: AC
  Filled 2019-08-05: qty 60

## 2019-08-05 MED ORDER — PHENYLEPHRINE 40 MCG/ML (10ML) SYRINGE FOR IV PUSH (FOR BLOOD PRESSURE SUPPORT)
PREFILLED_SYRINGE | INTRAVENOUS | Status: AC
  Filled 2019-08-05: qty 20

## 2019-08-05 MED ORDER — CHLORHEXIDINE GLUCONATE 4 % EX LIQD: 60.0000 mL | Freq: Once | CUTANEOUS | Status: DC

## 2019-08-05 MED ORDER — ONDANSETRON HCL 4 MG/2ML IJ SOLN
INTRAMUSCULAR | Status: AC
  Filled 2019-08-05: qty 4

## 2019-08-05 MED ORDER — INSULIN ASPART 100 UNIT/ML ~~LOC~~ SOLN
5.0000 [IU] | Freq: Once | SUBCUTANEOUS | Status: AC
  Administered 2019-08-05: 5 [IU] via SUBCUTANEOUS
  Filled 2019-08-05: qty 0.05

## 2019-08-05 MED ORDER — TIZANIDINE HCL 4 MG PO TABS: 4.0000 mg | ORAL_TABLET | Freq: Three times a day (TID) | ORAL | 1 refills | Status: DC

## 2019-08-05 MED ORDER — SUGAMMADEX SODIUM 200 MG/2ML IV SOLN
INTRAVENOUS | Status: DC | PRN
  Administered 2019-08-05: 300 mg via INTRAVENOUS

## 2019-08-05 MED ORDER — LIDOCAINE 2% (20 MG/ML) 5 ML SYRINGE
INTRAMUSCULAR | Status: AC
  Filled 2019-08-05: qty 15

## 2019-08-05 MED ORDER — HYDROMORPHONE HCL 1 MG/ML IJ SOLN
0.2500 mg | INTRAMUSCULAR | Status: DC | PRN
  Administered 2019-08-05: 0.5 mg via INTRAVENOUS
  Filled 2019-08-05: qty 0.5

## 2019-08-05 MED ORDER — ARTIFICIAL TEARS OPHTHALMIC OINT
TOPICAL_OINTMENT | OPHTHALMIC | Status: AC
  Filled 2019-08-05: qty 3.5

## 2019-08-05 MED ORDER — BUPIVACAINE LIPOSOME 1.3 % IJ SUSP
INTRAMUSCULAR | Status: AC
  Filled 2019-08-05: qty 20

## 2019-08-05 MED ORDER — ONDANSETRON HCL 4 MG/2ML IJ SOLN
INTRAMUSCULAR | Status: AC
  Filled 2019-08-05: qty 2

## 2019-08-05 MED ORDER — FENTANYL CITRATE (PF) 100 MCG/2ML IJ SOLN
INTRAMUSCULAR | Status: DC | PRN
  Administered 2019-08-05: 75 ug via INTRAVENOUS
  Administered 2019-08-05 (×2): 50 ug via INTRAVENOUS
  Administered 2019-08-05: 75 ug via INTRAVENOUS
  Administered 2019-08-05 (×2): 50 ug via INTRAVENOUS

## 2019-08-05 MED ORDER — CEFAZOLIN SODIUM-DEXTROSE 2-4 GM/100ML-% IV SOLN
2.0000 g | INTRAVENOUS | Status: AC
  Administered 2019-08-05: 2 g via INTRAVENOUS

## 2019-08-05 MED ORDER — ONDANSETRON HCL 4 MG/2ML IJ SOLN
4.0000 mg | Freq: Once | INTRAMUSCULAR | Status: AC
  Administered 2019-08-05: 4 mg via INTRAVENOUS
  Filled 2019-08-05: qty 2

## 2019-08-05 MED ORDER — IBUPROFEN 800 MG PO TABS: 800.0000 mg | ORAL_TABLET | Freq: Three times a day (TID) | ORAL | 0 refills | Status: DC | PRN

## 2019-08-05 MED ORDER — MIDAZOLAM HCL 2 MG/2ML IJ SOLN
INTRAMUSCULAR | Status: AC
  Filled 2019-08-05: qty 2

## 2019-08-05 MED ORDER — MIDAZOLAM HCL 2 MG/2ML IJ SOLN: 0.5000 mg | Freq: Once | INTRAMUSCULAR | Status: DC | PRN

## 2019-08-05 MED ORDER — PREGABALIN 50 MG PO CAPS
50.0000 mg | ORAL_CAPSULE | Freq: Once | ORAL | Status: AC
  Administered 2019-08-05: 50 mg via ORAL
  Filled 2019-08-05: qty 1

## 2019-08-05 MED ORDER — SUCCINYLCHOLINE 20MG/ML (10ML) SYRINGE FOR MEDFUSION PUMP - OPTIME
INTRAMUSCULAR | Status: DC | PRN
  Administered 2019-08-05: 120 mg via INTRAVENOUS

## 2019-08-05 MED ORDER — MIDAZOLAM HCL 5 MG/5ML IJ SOLN
INTRAMUSCULAR | Status: DC | PRN
  Administered 2019-08-05: 2 mg via INTRAVENOUS

## 2019-08-05 MED ORDER — DEXAMETHASONE SODIUM PHOSPHATE 10 MG/ML IJ SOLN
INTRAMUSCULAR | Status: AC
  Filled 2019-08-05: qty 1

## 2019-08-05 MED ORDER — EPHEDRINE 5 MG/ML INJ
INTRAVENOUS | Status: AC
  Filled 2019-08-05: qty 30

## 2019-08-05 MED ORDER — ROCURONIUM 10MG/ML (10ML) SYRINGE FOR MEDFUSION PUMP - OPTIME
INTRAVENOUS | Status: DC | PRN
  Administered 2019-08-05 (×2): 10 mg via INTRAVENOUS
  Administered 2019-08-05: 30 mg via INTRAVENOUS
  Administered 2019-08-05: 10 mg via INTRAVENOUS

## 2019-08-05 MED ORDER — BUPIVACAINE LIPOSOME 1.3 % IJ SUSP
INTRAMUSCULAR | Status: DC | PRN
  Administered 2019-08-05: 20 mL

## 2019-08-05 MED ORDER — LACTATED RINGERS IV SOLN
INTRAVENOUS | Status: DC
  Administered 2019-08-05: 1000 mL via INTRAVENOUS

## 2019-08-05 MED ORDER — ROCURONIUM BROMIDE 10 MG/ML (PF) SYRINGE
PREFILLED_SYRINGE | INTRAVENOUS | Status: AC
  Filled 2019-08-05: qty 10

## 2019-08-05 MED ORDER — METHOCARBAMOL 1000 MG/10ML IJ SOLN
500.0000 mg | Freq: Once | INTRAVENOUS | Status: AC
  Administered 2019-08-05: 500 mg via INTRAVENOUS
  Filled 2019-08-05: qty 5

## 2019-08-05 MED ORDER — CELECOXIB 400 MG PO CAPS
400.0000 mg | ORAL_CAPSULE | Freq: Once | ORAL | Status: AC
  Administered 2019-08-05: 400 mg via ORAL
  Filled 2019-08-05: qty 1

## 2019-08-05 MED ORDER — ONDANSETRON HCL 4 MG PO TABS: 4.0000 mg | ORAL_TABLET | Freq: Every day | ORAL | 1 refills | Status: DC | PRN

## 2019-08-05 MED ORDER — EPHEDRINE SULFATE 50 MG/ML IJ SOLN
INTRAMUSCULAR | Status: DC | PRN
  Administered 2019-08-05 (×2): 10 mg via INTRAVENOUS

## 2019-08-05 MED ORDER — BUPIVACAINE-EPINEPHRINE (PF) 0.25% -1:200000 IJ SOLN
INTRAMUSCULAR | Status: DC | PRN
  Administered 2019-08-05: 20 mL via PERINEURAL

## 2019-08-05 MED ORDER — HYDROCODONE-ACETAMINOPHEN 7.5-325 MG PO TABS: 1.0000 | ORAL_TABLET | Freq: Once | ORAL | Status: DC | PRN

## 2019-08-05 MED ORDER — CEFAZOLIN SODIUM-DEXTROSE 2-4 GM/100ML-% IV SOLN
INTRAVENOUS | Status: AC
  Filled 2019-08-05: qty 100

## 2019-08-05 MED ORDER — OXYCODONE HCL 5 MG PO TABS
5.0000 mg | ORAL_TABLET | Freq: Once | ORAL | Status: AC
  Administered 2019-08-05: 5 mg via ORAL
  Filled 2019-08-05: qty 1

## 2019-08-05 MED ORDER — PHENYLEPHRINE 40 MCG/ML (10ML) SYRINGE FOR IV PUSH (FOR BLOOD PRESSURE SUPPORT)
PREFILLED_SYRINGE | INTRAVENOUS | Status: AC
  Filled 2019-08-05: qty 10

## 2019-08-05 MED ORDER — PROPOFOL 10 MG/ML IV BOLUS
INTRAVENOUS | Status: DC | PRN
  Administered 2019-08-05: 200 mg via INTRAVENOUS

## 2019-08-05 MED ORDER — LIDOCAINE 2% (20 MG/ML) 5 ML SYRINGE
INTRAMUSCULAR | Status: AC
  Filled 2019-08-05: qty 5

## 2019-08-05 MED ORDER — ONDANSETRON HCL 4 MG/2ML IJ SOLN
INTRAMUSCULAR | Status: DC | PRN
  Administered 2019-08-05: 4 mg via INTRAVENOUS

## 2019-08-05 MED ORDER — OXYCODONE-ACETAMINOPHEN 5-325 MG PO TABS: 1.0000 | ORAL_TABLET | Freq: Four times a day (QID) | ORAL | 0 refills | Status: DC | PRN

## 2019-08-05 SURGICAL SUPPLY — 43 items
ANCHOR SUT BIO SW 4.75X19.1 (Anchor) ×9 IMPLANT
BENZOIN TINCTURE PRP APPL 2/3 (GAUZE/BANDAGES/DRESSINGS) ×3 IMPLANT
BLADE HEX COATED 2.75 (ELECTRODE) ×3 IMPLANT
CHLORAPREP W/TINT 26 (MISCELLANEOUS) ×3 IMPLANT
CLOSURE WOUND 1/2 X4 (GAUZE/BANDAGES/DRESSINGS) ×1
CLOTH BEACON ORANGE TIMEOUT ST (SAFETY) ×3 IMPLANT
COVER LIGHT HANDLE STERIS (MISCELLANEOUS) ×6 IMPLANT
COVER WAND RF STERILE (DRAPES) ×3 IMPLANT
DECANTER SPIKE VIAL GLASS SM (MISCELLANEOUS) ×3 IMPLANT
DRAPE ORTHO 2.5IN SPLIT 77X108 (DRAPES) ×2 IMPLANT
DRAPE ORTHO SPLIT 77X108 STRL (DRAPES) ×4
DRESSING MEPILEX BORDER 6X8 (GAUZE/BANDAGES/DRESSINGS) ×1 IMPLANT
DRSG MEPILEX BORDER 6X8 (GAUZE/BANDAGES/DRESSINGS) ×3
ELECT REM PT RETURN 9FT ADLT (ELECTROSURGICAL) ×3
ELECTRODE REM PT RTRN 9FT ADLT (ELECTROSURGICAL) ×1 IMPLANT
GLOVE BIO SURGEON STRL SZ7 (GLOVE) ×6 IMPLANT
GLOVE BIOGEL PI IND STRL 7.0 (GLOVE) ×3 IMPLANT
GLOVE BIOGEL PI INDICATOR 7.0 (GLOVE) ×6
GLOVE SKINSENSE NS SZ8.0 LF (GLOVE) ×4
GLOVE SKINSENSE STRL SZ8.0 LF (GLOVE) ×2 IMPLANT
GLOVE SS N UNI LF 8.5 STRL (GLOVE) ×3 IMPLANT
GOWN STRL REUS W/TWL LRG LVL3 (GOWN DISPOSABLE) ×6 IMPLANT
GOWN STRL REUS W/TWL XL LVL3 (GOWN DISPOSABLE) ×3 IMPLANT
INST SET MINOR BONE (KITS) ×3 IMPLANT
KIT SURGICAL DEVON (SET/KITS/TRAYS/PACK) ×3 IMPLANT
KIT TURNOVER KIT A (KITS) ×3 IMPLANT
MANIFOLD NEPTUNE II (INSTRUMENTS) ×3 IMPLANT
MARKER SKIN DUAL TIP RULER LAB (MISCELLANEOUS) ×3 IMPLANT
NEEDLE HYPO 21X1.5 SAFETY (NEEDLE) ×3 IMPLANT
NS IRRIG 1000ML POUR BTL (IV SOLUTION) ×3 IMPLANT
PACK TOTAL JOINT (CUSTOM PROCEDURE TRAY) ×3 IMPLANT
PAD ARMBOARD 7.5X6 YLW CONV (MISCELLANEOUS) ×3 IMPLANT
SET BASIN LINEN APH (SET/KITS/TRAYS/PACK) ×3 IMPLANT
SLING ARM IMMOBILIZER LRG (SOFTGOODS) ×3 IMPLANT
STRIP CLOSURE SKIN 1/2X4 (GAUZE/BANDAGES/DRESSINGS) ×2 IMPLANT
SUT ETHIBOND NAB OS 4 #2 30IN (SUTURE) ×3 IMPLANT
SUT MON AB 0 CT1 (SUTURE) ×3 IMPLANT
SUT MON AB 2-0 CT1 36 (SUTURE) ×3 IMPLANT
SUTURE TAPE 1.3 40 TPR END (SUTURE) ×2 IMPLANT
SUTURETAPE 1.3 40 TPR END (SUTURE) ×6
SYR 30ML LL (SYRINGE) ×3 IMPLANT
SYR BULB IRRIGATION 50ML (SYRINGE) ×3 IMPLANT
YANKAUER SUCT 12FT TUBE ARGYLE (SUCTIONS) ×3 IMPLANT

## 2019-08-05 NOTE — Transfer of Care (Signed)
Immediate Anesthesia Transfer of Care Note  Patient: Tina Pham  Procedure(s) Performed: [employer]a Appl CUFF REPAIR SHOULDER OPEN (Right Shoulder)  Patient Location: PACU  Anesthesia Type:General  Level of Consciousness: awake  Airway & Oxygen Therapy: Patient Spontanous Breathing and Patient connected to face mask oxygen  Post-op Assessment: Report given to RN  Post vital signs: Reviewed and stable  Last Vitals:  Vitals Value Taken Time  BP 130/76 08/05/19 0915  Temp    Pulse 101 08/05/19 0916  Resp 14 08/05/19 0916  SpO2 99 % 08/05/19 0916  Vitals shown include unvalidated device data.  Last Pain:  Vitals:   08/05/19 0649  TempSrc: Oral  PainSc: 6       Patients Stated Pain Goal: 8 (08/05/19 5430)  Complications: No apparent anesthesia complications

## 2019-08-05 NOTE — Op Note (Signed)
08/05/2019  9:11 AM  PATIENT:  Tina Pham  44 y.o. female  PRE-OPERATIVE DIAGNOSIS:  (Retear) torn rotator cuff right shoulder  POST-OPERATIVE DIAGNOSIS:  (Retear) torn rotator cuff right shoulder  PROCEDURE:  Procedure(s): OPEN ROTATOR CUFF REPAIR SHOULDER OPEN (Right) - 12878  SURGEON:  Surgeon(s) and Role:    Vickki Hearing, MD - Primary  FINDINGS: Rotator cuff was torn at the prior repair site with pull-through of the suture the suture anchor remained intact  No other pathologic findings  Details of surgery  Patient was seen in the preop area the surgical site was confirmed marked chart review was completed including imaging studies  Patient was taken to the operating room and had general anesthesia.  Patient is placed in the supine position and then a sandbag was placed under the right shoulder patient was then placed in the modified beachchair position  After cursory exam under anesthesia at which time we did not find any adhesions or stiffness in the shoulder the right shoulder and arm were prepped and draped sterilely  Timeout was then completed images were available and implants were available as well  Prior incision was used and extended proximally and distally and taken down to the deltoid fascia  The previous suture line from the prior rotator cuff repair was opened and the old sutures were removed.  Blunt dissection was carried out to free up the undersurface of the deltoid from the underlying rotator cuff.  Tear was immediately visible.  It was a 1 cm tear front to back with 1 cm of retraction.  The sutures from the prior cuff repair had pulled through the tendon.  The wound was irrigated copiously.  The rotator cuff was evaluated for delamination and for mobility.  There was no delamination noted AND  the cuff was very mobile.  The greater tuberosity was decorticated using a rondure until bleeding bone was observed.  We placed 2 suture anchors  medially (4.75 bio composite swivel locks) and then passed 2 to 1.3 mm suture tapes.  This was then tied down as a medial row.  The suture tails were then preserved and passed through a third 4.75 bio composite swivel lock as a lateral row.  This reapproximated the tendon down to bone gave a watertight closure.  The wound was then irrigated the soft tissues were injected with Marcaine with epinephrine and exparel.  The deltoid was closed with running #2 Ethibond suture.  Subcutaneous tissue was closed with 0 Monocryl and then a running 2-0 Monocryl suture with benzoin and Steri-Strips  Sterile dressing was then applied along with a shoulder immobilizer  PHYSICIAN ASSISTANT:   ASSISTANTS: none   ANESTHESIA:   general  EBL:  NONE   BLOOD ADMINISTERED:none  DRAINS: none   LOCAL MEDICATIONS USED:  MARCAINE   , Amount: 30 ml and OTHER Exparel 20 cc full-strength  SPECIMEN:  No Specimen  DISPOSITION OF SPECIMEN:  N/A  COUNTS:  YES  TOURNIQUET:  * No tourniquets in log *  DICTATION: .Dragon Dictation  PLAN OF CARE: Discharge to home after PACU  PATIENT DISPOSITION:  PACU - hemodynamically stable.   Delay start of Pharmacological VTE agent (>24hrs) due to surgical blood loss or risk of bleeding: not applicable

## 2019-08-05 NOTE — Anesthesia Procedure Notes (Signed)
Procedure Name: Intubation Date/Time: 08/05/2019 7:42 AM Performed by: Ollen Bowl, CRNA Pre-anesthesia Checklist: Patient identified, Patient being monitored, Timeout performed, Emergency Drugs available and Suction available Patient Re-evaluated:Patient Re-evaluated prior to induction Oxygen Delivery Method: Circle system utilized Preoxygenation: Pre-oxygenation with 100% oxygen Induction Type: IV induction Ventilation: Mask ventilation without difficulty Laryngoscope Size: Mac and 4 Grade View: Grade I Tube type: Oral Tube size: 7.0 mm Number of attempts: 1 Airway Equipment and Method: Stylet Placement Confirmation: ETT inserted through vocal cords under direct vision,  positive ETCO2 and breath sounds checked- equal and bilateral Secured at: 23 cm Tube secured with: Tape Dental Injury: Teeth and Oropharynx as per pre-operative assessment

## 2019-08-05 NOTE — Interval H&P Note (Signed)
History and Physical Interval Note:  08/05/2019 7:26 AM  Tina Pham  has presented today for surgery, with the diagnosis of torn rotator cuff right shoulder.  The various methods of treatment have been discussed with the patient and family. After consideration of risks, benefits and other options for treatment, the patient has consented to  Procedure(s): ROTATOR CUFF REPAIR SHOULDER OPEN (Right) as a surgical intervention.  The patient's history has been reviewed, patient examined, no change in status, stable for surgery.  I have reviewed the patient's chart and labs.  Questions were answered to the patient's satisfaction.     Fuller Canada

## 2019-08-05 NOTE — Discharge Instructions (Signed)

## 2019-08-05 NOTE — Anesthesia Preprocedure Evaluation (Signed)
Anesthesia Evaluation  Patient identified by MRN, date of birth, ID band Patient awake    Reviewed: Allergy & Precautions, NPO status , Patient's Chart, lab work & pertinent test results  Airway Mallampati: I  TM Distance: >3 FB Neck ROM: Full    Dental no notable dental hx. (+) Edentulous Upper, Edentulous Lower   Pulmonary neg pulmonary ROS, Current SmokerPatient did not abstain from smoking.,    Pulmonary exam normal breath sounds clear to auscultation       Cardiovascular Exercise Tolerance: Good hypertension, Pt. on medications negative cardio ROS Normal cardiovascular examI Rhythm:Regular Rate:Normal     Neuro/Psych  Headaches, Anxiety Depression  Neuromuscular disease negative psych ROS   GI/Hepatic negative GI ROS, Neg liver ROS,   Endo/Other  negative endocrine ROSdiabetes, Poorly Controlled, Type 2No current DM meds - A1C ~9.6  Renal/GU negative Renal ROS  negative genitourinary   Musculoskeletal negative musculoskeletal ROS (+)   Abdominal   Peds negative pediatric ROS (+)  Hematology negative hematology ROS (+) anemia , Last H/H 8.1/31 Denies transfusion -didn't know she had anemia    Anesthesia Other Findings   Reproductive/Obstetrics negative OB ROS                             Anesthesia Physical Anesthesia Plan  ASA: III  Anesthesia Plan: General   Post-op Pain Management:    Induction: Intravenous  PONV Risk Score and Plan: 2 and Ondansetron, Dexamethasone and Treatment may vary due to age or medical condition  Airway Management Planned: Oral ETT  Additional Equipment:   Intra-op Plan:   Post-operative Plan: Extubation in OR  Informed Consent: I have reviewed the patients History and Physical, chart, labs and discussed the procedure including the risks, benefits and alternatives for the proposed anesthesia with the patient or authorized representative who has  indicated his/her understanding and acceptance.     Dental advisory given  Plan Discussed with: CRNA  Anesthesia Plan Comments: (Plan Full PPE use Plan GETA D/W PT -WTP with same after Q&A  Offered ISB as she had last time 09/19/18 - states if it isn't necessary she doesn't want it )        Anesthesia Quick Evaluation

## 2019-08-05 NOTE — Brief Op Note (Signed)
08/05/2019  9:11 AM  PATIENT:  Tina Pham  44 y.o. female  PRE-OPERATIVE DIAGNOSIS:  (Retear) torn rotator cuff right shoulder  POST-OPERATIVE DIAGNOSIS:  (Retear) torn rotator cuff right shoulder  PROCEDURE:  Procedure(s): OPEN ROTATOR CUFF REPAIR SHOULDER OPEN (Right) - [id number]  SURGEON:  Surgeon(s) and Role:    * Dorris Vangorder E, MD - Primary  FINDINGS: Rotator cuff was torn at the prior repair site with pull-through of the suture the suture anchor remained intact  No other pathologic findings  Details of surgery  Patient was seen in the preop area the surgical site was confirmed marked chart review was completed including imaging studies  Patient was taken to the operating room and had general anesthesia.  Patient is placed in the supine position and then a sandbag was placed under the right shoulder patient was then placed in the modified beachchair position  After cursory exam under anesthesia at which time we did not find any adhesions or stiffness in the shoulder the right shoulder and arm were prepped and draped sterilely  Timeout was then completed images were available and implants were available as well  Prior incision was used and extended proximally and distally and taken down to the deltoid fascia  The previous suture line from the prior rotator cuff repair was opened and the old sutures were removed.  Blunt dissection was carried out to free up the undersurface of the deltoid from the underlying rotator cuff.  Tear was immediately visible.  It was a 1 cm tear front to back with 1 cm of retraction.  The sutures from the prior cuff repair had pulled through the tendon.  The wound was irrigated copiously.  The rotator cuff was evaluated for delamination and for mobility.  There was no delamination noted AND  the cuff was very mobile.  The greater tuberosity was decorticated using a rondure until bleeding bone was observed.  We placed 2 suture anchors  medially (4.75 bio composite swivel locks) and then passed 2 to 1.3 mm suture tapes.  This was then tied down as a medial row.  The suture tails were then preserved and passed through a third 4.75 bio composite swivel lock as a lateral row.  This reapproximated the tendon down to bone gave a watertight closure.  The wound was then irrigated the soft tissues were injected with Marcaine with epinephrine and exparel.  The deltoid was closed with running #2 Ethibond suture.  Subcutaneous tissue was closed with 0 Monocryl and then a running 2-0 Monocryl suture with benzoin and Steri-Strips  Sterile dressing was then applied along with a shoulder immobilizer  PHYSICIAN ASSISTANT:   ASSISTANTS: none   ANESTHESIA:   general  EBL:  NONE   BLOOD ADMINISTERED:none  DRAINS: none   LOCAL MEDICATIONS USED:  MARCAINE   , Amount: 30 ml and OTHER Exparel 20 cc full-strength  SPECIMEN:  No Specimen  DISPOSITION OF SPECIMEN:  N/A  COUNTS:  YES  TOURNIQUET:  * No tourniquets in log *  DICTATION: .Dragon Dictation  PLAN OF CARE: Discharge to home after PACU  PATIENT DISPOSITION:  PACU - hemodynamically stable.   Delay start of Pharmacological VTE agent (>24hrs) due to surgical blood loss or risk of bleeding: not applicable   

## 2019-08-05 NOTE — Anesthesia Postprocedure Evaluation (Signed)
Anesthesia Post Note Late Entry for 1030  Patient: Tina Pham  Procedure(s) Performed: ROTATOR CUFF REPAIR SHOULDER OPEN (Right Shoulder)  Patient location during evaluation: PACU Anesthesia Type: General Level of consciousness: awake and alert, oriented and patient cooperative Pain management: pain level controlled Vital Signs Assessment: post-procedure vital signs reviewed and stable Respiratory status: spontaneous breathing Cardiovascular status: stable Postop Assessment: no apparent nausea or vomiting Anesthetic complications: no     Last Vitals:  Vitals:   08/05/19 1030 08/05/19 1037  BP: 135/77 124/81  Pulse: 78 86  Resp: 18 16  Temp:  36.8 C  SpO2: 97% 99%    Last Pain:  Vitals:   08/05/19 1037  TempSrc: Oral  PainSc: 5                  Avaeh Ewer A

## 2019-08-06 ENCOUNTER — Telehealth: Payer: Self-pay | Admitting: Orthopedic Surgery

## 2019-08-06 NOTE — Telephone Encounter (Signed)
Patient called, as she was given a reminder call for Monday, 08/11/19, which was indicated as review MRI results. Patient had results, and had surgery yesterday, 08/05/19 (rotator cuff repair). When will patient need to return for post operative appointment?

## 2019-08-06 NOTE — Telephone Encounter (Signed)
One week post op

## 2019-08-07 NOTE — Telephone Encounter (Signed)
Called patient 11:55am today; aware of appointment scheduled as noted.

## 2019-08-11 ENCOUNTER — Ambulatory Visit: Payer: BLUE CROSS/BLUE SHIELD | Admitting: Orthopedic Surgery

## 2019-08-11 ENCOUNTER — Other Ambulatory Visit: Payer: Self-pay

## 2019-08-12 MED ORDER — OXYCODONE-ACETAMINOPHEN 5-325 MG PO TABS: 1.0000 | ORAL_TABLET | Freq: Four times a day (QID) | ORAL | 0 refills | Status: DC | PRN

## 2019-08-13 ENCOUNTER — Encounter: Payer: Self-pay | Admitting: Orthopedic Surgery

## 2019-08-13 ENCOUNTER — Ambulatory Visit (INDEPENDENT_AMBULATORY_CARE_PROVIDER_SITE_OTHER): Payer: BLUE CROSS/BLUE SHIELD | Admitting: Orthopedic Surgery

## 2019-08-13 ENCOUNTER — Telehealth: Payer: Self-pay | Admitting: Radiology

## 2019-08-13 ENCOUNTER — Other Ambulatory Visit: Payer: Self-pay

## 2019-08-13 VITALS — BP 130/85 | HR 89 | Temp 97.2°F | Ht 68.0 in | Wt 235.0 lb

## 2019-08-13 DIAGNOSIS — Z9889 Other specified postprocedural states: Secondary | ICD-10-CM

## 2019-08-13 NOTE — Progress Notes (Signed)
Chief Complaint  Patient presents with  . Follow-up    Post op on right shoulder, DOS 08-05-19.   43 8 days postop retear rotator cuff with repair doing well complains of itching and soreness in the right shoulder.  Also does not like Percocet would like it changed to hydrocodone on her next visit  Her wound looks great  I will see her in a week to clip the suture edges we can start occupational therapy  Wear her sling  08/05/2019   9:11 AM   PATIENT:  Tina Pham  44 y.o. female   PRE-OPERATIVE DIAGNOSIS:  (Retear) torn rotator cuff right shoulder   POST-OPERATIVE DIAGNOSIS:  (Retear) torn rotator cuff right shoulder   PROCEDURE:  Procedure(s): OPEN ROTATOR CUFF REPAIR SHOULDER OPEN (Right) - [id number]   SURGEON:  Surgeon(s) and Role:    Vickki Hearing, MD - Primary   FINDINGS: Rotator cuff was torn at the prior repair site with pull-through of the suture the suture anchor remained intact   No other pathologic findings    Current Outpatient Medications:  .  ibuprofen (ADVIL) 800 MG tablet, Take 1 tablet (800 mg total) by mouth every 8 (eight) hours as needed., Disp: 30 tablet, Rfl: 0 .  ondansetron (ZOFRAN) 4 MG tablet, Take 1 tablet (4 mg total) by mouth daily as needed for nausea or vomiting., Disp: 30 tablet, Rfl: 1 .  oxyCODONE-acetaminophen (PERCOCET/ROXICET) 5-325 MG tablet, Take 1 tablet by mouth every 6 (six) hours as needed for severe pain., Disp: 20 tablet, Rfl: 0 .  tiZANidine (ZANAFLEX) 4 MG tablet, Take 1 tablet (4 mg total) by mouth 3 (three) times daily., Disp: 90 tablet, Rfl: 1

## 2019-08-13 NOTE — Addendum Note (Signed)
Addended byCaffie Damme on: 08/13/2019 09:14 AM   Modules accepted: Orders

## 2019-08-13 NOTE — Telephone Encounter (Signed)
Patient will run out of pain meds on Sunday I told her will send you refill request to send to pharmacy on Thursday  Will route to Dr Rexene Edison on Thursday

## 2019-08-13 NOTE — Patient Instructions (Signed)
Start therapy take all meds   Start therapy   Wear sling   F/u 1 week

## 2019-08-14 ENCOUNTER — Telehealth: Payer: Self-pay | Admitting: Radiology

## 2019-08-14 NOTE — Telephone Encounter (Signed)
You stated next would be Norco but not sure what dose.

## 2019-08-14 NOTE — Telephone Encounter (Signed)
Patient called back this morning,08/14/19; states is to speak with Amy

## 2019-08-14 NOTE — Telephone Encounter (Signed)
Called back to patient; forms received. Patient aware of [employer] fee; [employer] authorization forms have already been filled out.

## 2019-08-14 NOTE — Telephone Encounter (Signed)
Patient called, and asked if her disability forms came on the fax?  She is asking that you call her back, [phone].

## 2019-08-15 ENCOUNTER — Other Ambulatory Visit: Payer: Self-pay | Admitting: Orthopedic Surgery

## 2019-08-15 MED ORDER — HYDROCODONE-ACETAMINOPHEN 10-325 MG PO TABS: 1.0000 | ORAL_TABLET | ORAL | 0 refills | Status: DC | PRN

## 2019-08-20 ENCOUNTER — Encounter: Payer: Self-pay | Admitting: Orthopedic Surgery

## 2019-08-20 ENCOUNTER — Ambulatory Visit (INDEPENDENT_AMBULATORY_CARE_PROVIDER_SITE_OTHER): Payer: BLUE CROSS/BLUE SHIELD | Admitting: Orthopedic Surgery

## 2019-08-20 ENCOUNTER — Other Ambulatory Visit: Payer: Self-pay

## 2019-08-20 ENCOUNTER — Ambulatory Visit (HOSPITAL_COMMUNITY): Payer: BLUE CROSS/BLUE SHIELD | Attending: Orthopedic Surgery

## 2019-08-20 ENCOUNTER — Other Ambulatory Visit: Payer: Self-pay | Admitting: Radiology

## 2019-08-20 ENCOUNTER — Encounter (HOSPITAL_COMMUNITY): Payer: Self-pay

## 2019-08-20 DIAGNOSIS — Z9889 Other specified postprocedural states: Secondary | ICD-10-CM

## 2019-08-20 DIAGNOSIS — M25511 Pain in right shoulder: Secondary | ICD-10-CM | POA: Insufficient documentation

## 2019-08-20 DIAGNOSIS — M25611 Stiffness of right shoulder, not elsewhere classified: Secondary | ICD-10-CM | POA: Diagnosis present

## 2019-08-20 DIAGNOSIS — R29898 Other symptoms and signs involving the musculoskeletal system: Secondary | ICD-10-CM

## 2019-08-20 MED ORDER — HYDROCODONE-ACETAMINOPHEN 10-325 MG PO TABS: 1.0000 | ORAL_TABLET | ORAL | 0 refills | Status: DC | PRN

## 2019-08-20 NOTE — Progress Notes (Signed)
Chief Complaint  Patient presents with  . Routine Post Op    08/05/19 right shoulder RCR     15 days postop open rerepair of traumatic rupture of previously repaired rotator cuff right shoulder  norco 10 for pain    Tina Pham is doing well her incision looks good the 2 absorbable suture ends were clipped  She will continue with physical therapy follow-up with [place] in 4 weeks she can remove the sling on February 9  Meds ordered this encounter  Medications  . HYDROcodone-acetaminophen (NORCO) 10-325 MG tablet    Sig: Take 1 tablet by mouth every 4 (four) hours as needed.    Dispense:  42 tablet    Refill:  0    Continue PT  Sling until February 9

## 2019-08-20 NOTE — Telephone Encounter (Signed)
Hydrocodone will run out this weekend, will send Dr Romeo Apple message to refill on Thursday am

## 2019-08-20 NOTE — Patient Instructions (Signed)
Wear sling until feb 12   Continue therapy and   Hydrocodone

## 2019-08-20 NOTE — Patient Instructions (Signed)
TOWEL SLIDES COMPLETE FOR 1-3 MINUTES, 3-5 TIMES PER DAY  SHOULDER: Flexion On Table   Place hands on table, elbows straight. Move hips away from body. Press hands down into table. Hold ___ seconds. ___ reps per set, ___ sets per day, ___ days per week  Abduction (Passive)   With arm out to side, resting on table, lower head toward arm, keeping trunk away from table. Hold ____ seconds. Repeat ____ times. Do ____ sessions per day.  Copyright  [employer]. All rights reserved.     Internal Rotation (Assistive)   Seated with elbow bent at right angle and held against side, slide arm on table surface in an inward arc. Repeat ____ times. Do ____ sessions per day. Activity: Use this motion to brush crumbs off the table.  Copyright  [employer]. All rights reserved.    COMPLETE PENDULUM EXERCISES FOR 30 SECONDS TO A MINUTE EACH, 3-5 TIMES PER DAY. ROM: Pendulum (Side-to-Side)     http://orth.exer.us/792   Copyright  [employer]. All rights reserved.  Pendulum Forward/Back   Bend forward 90 at waist, using table for support. Rock body forward and back to swing arm. Repeat ____ times. Do ____ sessions per day.    Copyright  [employer]. All rights reserved.  AROM: Wrist Extension   With right palm down, bend wrist up. Repeat 10____ times per set. Do ____ sets per session. Do __3__ sessions per day.  Copyright  [employer]. All rights reserved.   AROM: Wrist Flexion   With right palm up, bend wrist up. Repeat ___10_ times per set. Do ____ sets per session. Do __3__ sessions per day.  Copyright  [employer]HI. All rights reserved.   AROM: Forearm Pronation / Supination   With right arm in handshake position, slowly rotate palm down until stretch is felt. Relax. Then rotate palm up until stretch is felt. Repeat __10__ times per set. Do ____ sets per session. Do __3__ sessions per day.  Copyright  [employer]. All rights reserved.   AFlexion (Passive)   Use other hand to bend elbow, with thumb toward same  shoulder. Do NOT force this motion. Hold ____ seconds. Repeat ____ times. Do ____ sessions per day.      

## 2019-08-20 NOTE — Therapy (Signed)
[place], [place], [address] Phone: [phone]   Fax:  [phone]  Occupational Therapy Evaluation  Patient Details  Name: Tina Pham MRN: [id number] Date of Birth: November 04, 1975 Referring Provider (OT): Arther Abbott, MD   Encounter Date: 08/20/2019  OT End of Session - 08/20/19 1501    Visit Number  1    Number of Visits  16    Date for OT Re-Evaluation  10/01/19    Authorization Type  [employer] other    Authorization Time Period  No authorization needed. PT/OT 30 visit limit. 28 remain 11/22/18-11/21/19    Authorization - Visit Number  3    Authorization - Number of Visits  28    OT Start Time  6720    OT Stop Time  1423    OT Time Calculation (min)  38 min    Activity Tolerance  Patient tolerated treatment well    Behavior During Therapy  WFL for tasks assessed/performed       Past Medical History:  Diagnosis Date  . Anxiety   . Arthritis   . Chronic back pain   . Chronic bronchitis ([facility])   . Depression   . Diabetes mellitus    lost weight and is not on meds now.  . High cholesterol   . History of kidney stones   . Hypertension   . Migraine   . Neuropathic pain of foot   . Plantar fasciitis   . Sciatica   . Torn rotator cuff     Past Surgical History:  Procedure Laterality Date  . BACK SURGERY     herniated disc; had disc removed and then fusion; total of 3 surgeries  . EYE SURGERY Bilateral    removal of cyst and straightening of muscles  . KNEE ARTHROSCOPY WITH MEDIAL MENISECTOMY Left 12/27/2016   Procedure: LEFT KNEE DIAGNOSTIC ARTHROSCOPY;  Surgeon: Carole Civil, MD;  Location: [facility];  Service: Orthopedics;  Laterality: Left;  . SHOULDER OPEN ROTATOR CUFF REPAIR Right 09/19/2018   Procedure: ROTATOR CUFF REPAIR SHOULDER OPEN;  Surgeon: Carole Civil, MD;  Location: [facility];  Service: Orthopedics;  Laterality: Right;  . SHOULDER OPEN ROTATOR CUFF REPAIR Right 08/05/2019    Procedure: ROTATOR CUFF REPAIR SHOULDER OPEN;  Surgeon: Carole Civil, MD;  Location: [facility];  Service: Orthopedics;  Laterality: Right;  . TUBAL LIGATION      There were no vitals filed for this visit.  Subjective Assessment - 08/20/19 1354    Subjective   S: I knew I had retorn it when I went to catch a resident. It was an automatic reaction to keep her from falling.    Pertinent History  Patient is a 44 y/o female S/P right RTC repair which is a repair of previous RTC repair which was performed on 09/19/18. Pt reports that she reinjuried her arm at work when a resident fell and she automatically reacted and attempted to catch her. She re-tore her repair and a second one was completed on 08/05/19. Dr. Aline Brochure has referred patient to occupational therapy for evaluation and treatment.    Patient Stated Goals  To return to normal.    Currently in Pain?  Yes    Pain Score  2     Pain Location  Shoulder    Pain Orientation  Right    Pain Descriptors / Indicators  Aching;Shooting;Sharp    Pain Type  Acute pain    Pain  Radiating Towards  incision area up into the neck    Pain Onset  1 to 4 weeks ago    Pain Frequency  Intermittent    Aggravating Factors   movement    Pain Relieving Factors  pain medication, heat    Effect of Pain on Daily Activities  Pt is unable to complete any daily tasks with RUE.    Multiple Pain Sites  No        [facility] OT Assessment - 08/20/19 1356      Assessment   Medical Diagnosis  Right RTC repair    Referring Provider (OT)  Fuller Canada, MD    Onset Date/Surgical Date  08/05/19    Hand Dominance  Right    Next MD Visit  09/17/19    Prior Therapy  Pt received OT services at this clinic for first surgery although was unable to finish due to COVID shut down.       Precautions   Precautions  Shoulder    Type of Shoulder Precautions  P/ROM for 4 weeks (09/02/19) Sling may be removed at 4 weeks. Progress to AA/ROM on 09/30/19. 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      ADL   ADL comments  Unable to complete any daily tasks at this time with her RUE.       Mobility   Mobility Status  Independent      Written Expression   Dominant Hand  Right      Vision - History   Baseline Vision  Wears glasses all the time      Cognition   Overall Cognitive Status  Within Functional Limits for tasks assessed      Observation/Other Assessments   Focus on Therapeutic Outcomes (FOTO)   Complete next session      ROM / Strength   AROM / PROM / Strength  AROM;PROM;Strength      Palpation   Palpation comment  Min fascial restrictions in upper trapezius, upper arm, and scapularis region.       AROM   Overall AROM   Unable to assess;Due to precautions      PROM   Overall PROM Comments  Assessed supine. IR/er adducted    PROM Assessment Site  Shoulder    Right/Left Shoulder  Right    Right Shoulder Flexion  130 Degrees    Right Shoulder ABduction  135 Degrees    Right Shoulder Internal Rotation  90 Degrees    Right Shoulder External Rotation  65 Degrees      Strength   Overall Strength  Unable to assess;Due to precautions                      OT Education - 08/20/19 1412    Education Details  table slides; A/ROM wrist and elbow, pendulums    Person(s) Educated  Patient    Methods  Explanation;Demonstration;Verbal cues;Handout    Comprehension  Verbal cues required;Returned demonstration;Verbalized understanding       OT Short Term Goals - 08/20/19 1521      OT SHORT TERM GOAL #1    Title  Patient will be educated and independent with HEP to faciliate progress in therapy and allow her to return to using her RUE as her dominant extremity for all daily and work activities.     Time  4    Period  Weeks    Status  New    Target Date  09/17/19      OT SHORT TERM GOAL #2   Title  Patient will increase RUE P/ROM to WNL in order to complete upper body dressing tasks with less difficulty.    Time  4    Period  Weeks    Status  New      OT SHORT TERM GOAL #3   Title  Pt will increase RUE strength to 3/5 in order to complete waist level tasks with less difficulty.    Time  4    Period  Weeks    Status  New      OT SHORT TERM GOAL #4   Title  Patient will report a decrease in pain level of 3/10 when completing daily tasks with her RUE.    Time  4    Period  Weeks    Status  New      OT SHORT TERM GOAL #5   Title  Patient will demonstrate fascial restrictions of trace amount in her RUE in order to increase functional mobility needed to complete reaching tasks.    Time  4    Period  Weeks    Status  New        OT Long Term Goals - 08/20/19 1525      OT LONG TERM GOAL #1   Title  Patient will return to highest level of independence while returning to work using her RUE for 75% or more of daily tasks.    Time  8    Period  Weeks    Status  New    Target Date  10/15/19      OT LONG TERM GOAL #2   Title  Patient will increase A/ROM to WNL in order to increase ability to reach above shoulder level and into cabinets with less difficulty.    Time  8    Period  Weeks    Status  New      OT LONG TERM GOAL #3   Title  Patient will increase RUE strength to 4+/5 in order to lift normal houshold and work related items.    Time  8    Period  Weeks    Status  New      OT LONG TERM GOAL #4   Title  Patient will report a decrease in pain of approximately 2/10 or less when using her RUE for daily tasks.    Time  8    Period  Weeks    Status  New             Plan - 08/20/19 1504    Clinical Impression Statement  A: Patient is a 44 y/o female S/P right RTC repair for the second time causing increased pain, fascial restrictions, decreased ROM and strength resulting in difficulty completing daily and work related tasks using her RUE as her dominant extremity.    OT Occupational Profile and History  Problem Focused Assessment - Including review  of records relating to presenting problem    Occupational performance deficits (Please refer to evaluation for details):  ADL's;IADL's;Work    Rehab Potential  Excellent    Clinical Decision Making  Limited treatment options, no task modification necessary    Comorbidities Affecting Occupational Performance:  May have comorbidities impacting occupational performance    Modification or Assistance to Complete Evaluation   No modification of tasks or assist necessary to complete eval    OT Frequency  2x / week    OT Duration  8 weeks    OT Treatment/Interventions  Self-care/ADL training;Ultrasound;Patient/family education;DME and/or AE instruction;Passive range of motion;Cryotherapy;Electrical Stimulation;Moist Heat;Therapeutic exercise;Manual Therapy;Therapeutic activities;Neuromuscular education    Plan  P: Patient will benefit from skilled OT services to increase functional use of RUE during daily tasks and allow her to return to work. Treatment Plan: Myofasical release, manual stretching, P/ROM, AA/ROM, A/ROM, and general shoulder strengthening. Modalities PRN.    Consulted and Agree with Plan of Care  Patient       Patient will benefit from skilled therapeutic intervention in order to improve the following deficits and impairments:           Visit Diagnosis: Other symptoms and signs involving the musculoskeletal system - Plan: Ot plan of care cert/re-cert  Acute pain of right shoulder - Plan: Ot plan of care cert/re-cert  Stiffness of right shoulder, not elsewhere classified - Plan: Ot  plan of care cert/re-cert    Problem List Patient Active Problem List   Diagnosis Date Noted  . S/P right rotator cuff repair open 09/19/18 repaired again 08/05/19 10/04/2018  . Complete tear of right rotator cuff   . Chronic pain of left knee   . Left shoulder pain 09/21/2015  . Disorders of bursae and tendons in shoulder region, unspecified 09/25/2013   Nikol Lemar, OTR/L,CBIS  [phone]  08/20/2019, 3:42 PM  [facility] [address] [place], [place], [address] Phone: [phone]   Fax:  [phone]  Name: Tina Pham MRN: [id number] Date of Birth: Dec 22, 1975

## 2019-08-21 MED ORDER — HYDROCODONE-ACETAMINOPHEN 10-325 MG PO TABS: 1.0000 | ORAL_TABLET | ORAL | 0 refills | Status: DC | PRN

## 2019-08-21 NOTE — Addendum Note (Signed)
Addended byCaffie Damme on: 08/21/2019 08:07 AM   Modules accepted: Orders

## 2019-08-22 ENCOUNTER — Ambulatory Visit (HOSPITAL_COMMUNITY): Payer: BLUE CROSS/BLUE SHIELD

## 2019-08-22 ENCOUNTER — Other Ambulatory Visit: Payer: Self-pay

## 2019-08-22 DIAGNOSIS — R29898 Other symptoms and signs involving the musculoskeletal system: Secondary | ICD-10-CM | POA: Diagnosis not present

## 2019-08-22 DIAGNOSIS — M25611 Stiffness of right shoulder, not elsewhere classified: Secondary | ICD-10-CM

## 2019-08-22 DIAGNOSIS — M25511 Pain in right shoulder: Secondary | ICD-10-CM

## 2019-08-22 NOTE — Therapy (Signed)
[facility] [address] [place], [place], [address] Phone: [phone]   Fax:  [phone]  Occupational Therapy Treatment  Patient Details  Name: Tina Pham MRN: [id number] Date of Birth: 11-08-1975 Referring Provider (OT): Fuller Canada, MD   Encounter Date: 08/22/2019  OT End of Session - 08/22/19 1102    Visit Number  2    Number of Visits  16    Date for OT Re-Evaluation  10/01/19    Authorization Type  [employer] other    Authorization Time Period  No authorization needed. PT/OT 30 visit limit. 28 remain 11/22/18-11/21/19    Authorization - Visit Number  4    Authorization - Number of Visits  28    OT Start Time  1030    OT Stop Time  1108    OT Time Calculation (min)  38 min    Activity Tolerance  Patient tolerated treatment well    Behavior During Therapy  WFL for tasks assessed/performed       Past Medical History:  Diagnosis Date  . Anxiety   . Arthritis   . Chronic back pain   . Chronic bronchitis ([facility])   . Depression   . Diabetes mellitus    lost weight and is not on meds now.  . High cholesterol   . History of kidney stones   . Hypertension   . Migraine   . Neuropathic pain of foot   . Plantar fasciitis   . Sciatica   . Torn rotator cuff     Past Surgical History:  Procedure Laterality Date  . BACK SURGERY     herniated disc; had disc removed and then fusion; total of 3 surgeries  . EYE SURGERY Bilateral    removal of cyst and straightening of muscles  . KNEE ARTHROSCOPY WITH MEDIAL MENISECTOMY Left 12/27/2016   Procedure: LEFT KNEE DIAGNOSTIC ARTHROSCOPY;  Surgeon: Vickki Hearing, MD;  Location: [facility];  Service: Orthopedics;  Laterality: Left;  . SHOULDER OPEN ROTATOR CUFF REPAIR Right 09/19/2018   Procedure: ROTATOR CUFF REPAIR SHOULDER OPEN;  Surgeon: Vickki Hearing, MD;  Location: [facility];  Service: Orthopedics;  Laterality: Right;  . SHOULDER OPEN ROTATOR CUFF REPAIR Right 08/05/2019    Procedure: ROTATOR CUFF REPAIR SHOULDER OPEN;  Surgeon: Vickki Hearing, MD;  Location: [facility];  Service: Orthopedics;  Laterality: Right;  . TUBAL LIGATION      There were no vitals filed for this visit.  Subjective Assessment - 08/22/19 1204    Subjective   S: I took my pain medication about 20 minutes before I came here.    Currently in Pain?  Yes    Pain Score  3     Pain Location  Shoulder    Pain Orientation  Right    Pain Descriptors / Indicators  Aching;Sore    Pain Type  Acute pain         [facility] OT Assessment - 08/22/19 1038      Assessment   Medical Diagnosis  Right RTC repair      Precautions   Precautions  Shoulder    Type of Shoulder Precautions  P/ROM for 4 weeks (09/02/19) Sling may be removed at 4 weeks. Progress to AA/ROM on 09/30/19. A/ROM on 10/28/19    Shoulder Interventions  Shoulder sling/immobilizer;At all times;Off for dressing/bathing/exercises      Observation/Other Assessments   Focus on Therapeutic Outcomes (FOTO)   36/100  OT Treatments/Exercises (OP) - 08/22/19 1046      Exercises   Exercises  Shoulder      Shoulder Exercises: Supine   Protraction  PROM;10 reps    Horizontal ABduction  PROM;10 reps    External Rotation  PROM;10 reps    Internal Rotation  PROM;10 reps    Flexion  PROM;10 reps    ABduction  PROM;10 reps      Shoulder Exercises: Seated   Extension  AROM;10 reps    Row  AROM;10 reps    Other Seated Exercises  scapular depression; 10Xl     Other Seated Exercises  5 backward shoulder rolls       Manual Therapy   Manual Therapy  Myofascial release    Manual therapy comments  completed separately from therapeutic exercises    Myofascial Release  Myofascial release and manual stretching completed to right UE, upper trapezius, and scapularis region to decrease fascial restrictions and increase joint mobility in a pain free zone.              OT Education - 08/22/19 1104    Education Details   reviewed therapy goals. Completed FOTO and discussed use in therapy.    Person(s) Educated  Patient    Methods  Explanation    Comprehension  Verbalized understanding       OT Short Term Goals - 08/22/19 1037      OT SHORT TERM GOAL #1   Title  Patient will be educated and independent with HEP to faciliate progress in therapy and allow her to return to using her RUE as her dominant extremity for all daily and work activities.     Time  4    Period  Weeks    Status  On-going    Target Date  09/17/19      OT SHORT TERM GOAL #2   Title  Patient will increase RUE P/ROM to WNL in order to complete upper body dressing tasks with less difficulty.    Time  4    Period  Weeks    Status  On-going      OT SHORT TERM GOAL #3   Title  Pt will increase RUE strength to 3/5 in order to complete waist level tasks with less difficulty.    Time  4    Period  Weeks    Status  On-going      OT SHORT TERM GOAL #4   Title  Patient will report a decrease in pain level of 3/10 when completing daily tasks with her RUE.    Time  4    Period  Weeks    Status  On-going      OT SHORT TERM GOAL #5   Title  Patient will demonstrate fascial restrictions of trace amount in her RUE in order to increase functional mobility needed to complete reaching tasks.    Time  4    Period  Weeks    Status  On-going        OT Long Term Goals - 08/22/19 1037      OT LONG TERM GOAL #1   Title  Patient will return to highest level of independence while returning to work using her RUE for 75% or more of daily tasks.    Time  8    Period  Weeks    Status  On-going      OT LONG TERM GOAL #2   Title  Patient will increase A/ROM  to WNL in order to increase ability to reach above shoulder level and into cabinets with less difficulty.    Time  8    Period  Weeks    Status  On-going      OT LONG TERM GOAL #3   Title  Patient will increase RUE strength to 4+/5 in order to lift normal houshold and work related items.     Time  8    Period  Weeks    Status  On-going      OT LONG TERM GOAL #4   Title  Patient will report a decrease in pain of approximately 2/10 or less when using her RUE for daily tasks.    Time  8    Period  Weeks    Status  On-going            Plan - 08/22/19 1102    Clinical Impression Statement  A: Initiated myofascial release, manual stretching scapular mobility exercises seated. Completed FOTO and reviewed goals. Manual techniques were completed to address fascial restrictions. VC for form and technique.    Body Structure / Function / Physical Skills  ADL;UE functional use;Fascial restriction;Pain;ROM;Scar mobility;Decreased knowledge of use of DME;Strength    Plan  P: Add isometric supine exercises and therapy ball stretches    Consulted and Agree with Plan of Care  Patient       Patient will benefit from skilled therapeutic intervention in order to improve the following deficits and impairments:   Body Structure / Function / Physical Skills: ADL, UE functional use, Fascial restriction, Pain, ROM, Scar mobility, Decreased knowledge of use of DME, Strength       Visit Diagnosis: Other symptoms and signs involving the musculoskeletal system  Acute pain of right shoulder  Stiffness of right shoulder, not elsewhere classified    Problem List Patient Active Problem List   Diagnosis Date Noted  . S/P right rotator cuff repair open 09/19/18 repaired again 08/05/19 10/04/2018  . Complete tear of right rotator cuff   . Chronic pain of left knee   . Left shoulder pain 09/21/2015  . Disorders of bursae and tendons in shoulder region, unspecified 09/25/2013   Lekeshia Kram, OTR/L,CBIS  [phone]  08/22/2019, 12:06 PM  [facility] [address] [place], [place], [address] Phone: [phone]   Fax:  [phone]  Name: SINCERITY CEDAR MRN: [id number] Date of Birth: 10-23-1975

## 2019-08-25 ENCOUNTER — Ambulatory Visit: Payer: BLUE CROSS/BLUE SHIELD | Admitting: Orthopedic Surgery

## 2019-08-25 ENCOUNTER — Telehealth: Payer: Self-pay | Admitting: Radiology

## 2019-08-25 NOTE — Telephone Encounter (Signed)
She came by, she has 2 spots where a black stitch is coming out of the skin, but her sutures are dissolvable, I have cleaned them, and pulled on one, slightly, gently, but could not see end of it or a knot to clip. I want you to look at it tomorrow when you are in the office, she will be here at 3:30 tomorrow.

## 2019-08-26 ENCOUNTER — Other Ambulatory Visit: Payer: Self-pay

## 2019-08-26 ENCOUNTER — Ambulatory Visit (INDEPENDENT_AMBULATORY_CARE_PROVIDER_SITE_OTHER): Payer: BLUE CROSS/BLUE SHIELD | Admitting: Orthopedic Surgery

## 2019-08-26 ENCOUNTER — Encounter: Payer: Self-pay | Admitting: Orthopedic Surgery

## 2019-08-26 DIAGNOSIS — Z4889 Encounter for other specified surgical aftercare: Secondary | ICD-10-CM

## 2019-08-27 ENCOUNTER — Encounter (HOSPITAL_COMMUNITY): Payer: Self-pay

## 2019-08-27 ENCOUNTER — Ambulatory Visit (HOSPITAL_COMMUNITY): Payer: BLUE CROSS/BLUE SHIELD | Attending: Orthopedic Surgery

## 2019-08-27 DIAGNOSIS — M25611 Stiffness of right shoulder, not elsewhere classified: Secondary | ICD-10-CM | POA: Insufficient documentation

## 2019-08-27 DIAGNOSIS — R29898 Other symptoms and signs involving the musculoskeletal system: Secondary | ICD-10-CM | POA: Insufficient documentation

## 2019-08-27 DIAGNOSIS — M25511 Pain in right shoulder: Secondary | ICD-10-CM | POA: Insufficient documentation

## 2019-08-27 MED ORDER — IBUPROFEN 800 MG PO TABS: 800.0000 mg | ORAL_TABLET | Freq: Three times a day (TID) | ORAL | 0 refills | Status: DC | PRN

## 2019-08-27 MED ORDER — HYDROCODONE-ACETAMINOPHEN 7.5-325 MG PO TABS: 1.0000 | ORAL_TABLET | Freq: Four times a day (QID) | ORAL | 0 refills | Status: DC | PRN

## 2019-08-27 NOTE — Therapy (Signed)
[place], [place], [address] Phone: [phone]   Fax:  [phone]  Occupational Therapy Treatment  Patient Details  Name: Tina Pham MRN: [id number] Date of Birth: 10-Dec-1975 Referring Provider (OT): Arther Abbott, MD   Encounter Date: 08/27/2019  OT End of Session - 08/27/19 1408    Visit Number  3    Number of Visits  16    Date for OT Re-Evaluation  10/01/19    Authorization Type  [employer] other    Authorization Time Period  No authorization needed. PT/OT 30 visit limit. 28 remain 11/22/18-11/21/19    Authorization - Visit Number  5    Authorization - Number of Visits  28    OT Start Time  1346    OT Stop Time  1418    OT Time Calculation (min)  32 min    Activity Tolerance  Patient tolerated treatment well    Behavior During Therapy  WFL for tasks assessed/performed       Past Medical History:  Diagnosis Date  . Anxiety   . Arthritis   . Chronic back pain   . Chronic bronchitis ([place])   . Depression   . Diabetes mellitus    lost weight and is not on meds now.  . High cholesterol   . History of kidney stones   . Hypertension   . Migraine   . Neuropathic pain of foot   . Plantar fasciitis   . Sciatica   . Torn rotator cuff     Past Surgical History:  Procedure Laterality Date  . BACK SURGERY     herniated disc; had disc removed and then fusion; total of 3 surgeries  . EYE SURGERY Bilateral    removal of cyst and straightening of muscles  . KNEE ARTHROSCOPY WITH MEDIAL MENISECTOMY Left 12/27/2016   Procedure: LEFT KNEE DIAGNOSTIC ARTHROSCOPY;  Surgeon: Carole Civil, MD;  Location: [facility];  Service: Orthopedics;  Laterality: Left;  . SHOULDER OPEN ROTATOR CUFF REPAIR Right 09/19/2018   Procedure: ROTATOR CUFF REPAIR SHOULDER OPEN;  Surgeon: Carole Civil, MD;  Location: [facility];  Service: Orthopedics;  Laterality: Right;  . SHOULDER OPEN ROTATOR CUFF REPAIR Right 08/05/2019   Procedure: ROTATOR CUFF REPAIR SHOULDER OPEN;  Surgeon: Carole Civil, MD;  Location: [facility];  Service: Orthopedics;  Laterality: Right;  . TUBAL LIGATION      There were no vitals filed for this visit.  Subjective Assessment - 08/27/19 1402    Subjective   S: The stitches are not coming out. Dr. Aline Brochure said to put alcohol on them and keep a bandaid on them. They should eventually dissolve.    Currently in Pain?  Yes    Pain Score  3     Pain Location  Shoulder    Pain Orientation  Right    Pain Descriptors / Indicators  Aching;Sore    Pain Type  Acute pain    Pain Radiating Towards  incision areas up into the neck    Pain Onset  1 to 4 weeks ago    Pain Frequency  Intermittent    Aggravating Factors   movement    Pain Relieving Factors  pain medication, heat    Effect of Pain on Daily Activities  Pt is unable to complete any daily tasks with RUE at this time.    Multiple Pain Sites  No         [facility] OT  Assessment - 08/27/19 1406      Assessment   Medical Diagnosis  Right RTC repair      Precautions   Precautions  Shoulder    Type of Shoulder Precautions  P/ROM for 4 weeks (09/02/19) Sling may be removed at 4 weeks. Progress to AA/ROM on 09/30/19. A/ROM on 10/28/19    Shoulder Interventions  Shoulder sling/immobilizer;At all times;Off for dressing/bathing/exercises               OT Treatments/Exercises (OP) - 08/27/19 1407      Exercises   Exercises  Shoulder      Shoulder Exercises: Supine   Protraction  PROM;10 reps    Horizontal ABduction  PROM;10 reps    External Rotation  PROM;10 reps    Internal Rotation  PROM;10 reps    Flexion  PROM;10 reps    ABduction  PROM;10 reps      Shoulder Exercises: Seated   Extension  AROM;10 reps    Row  AROM;10 reps    Other Seated Exercises  scapular depression; 10Xl       Shoulder Exercises: Therapy Ball   Flexion  10 reps    ABduction  10 reps      Shoulder Exercises: ROM/Strengthening   Anterior Glide   3x10"    Caudal Glide  3x10"      Shoulder Exercises: Isometric Strengthening   Flexion  Supine;3X5"    Extension  Supine;3X5"    External Rotation  Supine;3X5"    Internal Rotation  Supine;3X5"    ABduction  Supine;3X5"    ADduction  Supine;3X5"      Manual Therapy   Manual Therapy  Myofascial release    Manual therapy comments  completed separately from therapeutic exercises    Myofascial Release  Myofascial release and manual stretching completed to right UE, upper trapezius, and scapularis region to decrease fascial restrictions and increase joint mobility in a pain free zone.                OT Short Term Goals - 08/22/19 1037      OT SHORT TERM GOAL #1   Title  Patient will be educated and independent with HEP to faciliate progress in therapy and allow her to return to using her RUE as her dominant extremity for all daily and work activities.     Time  4    Period  Weeks    Status  On-going    Target Date  09/17/19      OT SHORT TERM GOAL #2   Title  Patient will increase RUE P/ROM to WNL in order to complete upper body dressing tasks with less difficulty.    Time  4    Period  Weeks    Status  On-going      OT SHORT TERM GOAL #3   Title  Pt will increase RUE strength to 3/5 in order to complete waist level tasks with less difficulty.    Time  4    Period  Weeks    Status  On-going      OT SHORT TERM GOAL #4   Title  Patient will report a decrease in pain level of 3/10 when completing daily tasks with her RUE.    Time  4    Period  Weeks    Status  On-going      OT SHORT TERM GOAL #5   Title  Patient will demonstrate fascial restrictions of trace amount in her RUE  in order to increase functional mobility needed to complete reaching tasks.    Time  4    Period  Weeks    Status  On-going        OT Long Term Goals - 08/22/19 1037      OT LONG TERM GOAL #1   Title  Patient will return to highest level of independence while returning to work using her  RUE for 75% or more of daily tasks.    Time  8    Period  Weeks    Status  On-going      OT LONG TERM GOAL #2   Title  Patient will increase A/ROM to WNL in order to increase ability to reach above shoulder level and into cabinets with less difficulty.    Time  8    Period  Weeks    Status  On-going      OT LONG TERM GOAL #3   Title  Patient will increase RUE strength to 4+/5 in order to lift normal houshold and work related items.    Time  8    Period  Weeks    Status  On-going      OT LONG TERM GOAL #4   Title  Patient will report a decrease in pain of approximately 2/10 or less when using her RUE for daily tasks.    Time  8    Period  Weeks    Status  On-going            Plan - 08/27/19 1426    Clinical Impression Statement  A: Added isometrics strenghtening, shoulder glides, and therapy ball stretches this session. patient was able to complete all exercises with VC for form and technique. Manual techniques were completed to address fascial restrictions in the right shoulder. More restrictions noted in the upper trapezius region close to the neck.    Body Structure / Function / Physical Skills  ADL;UE functional use;Fascial restriction;Pain;ROM;Scar mobility;Decreased knowledge of use of DME;Strength    Plan  P: Continue with P/ROM. May progress isometrics to 5x5" if able to tolerate.    Consulted and Agree with Plan of Care  Patient       Patient will benefit from skilled therapeutic intervention in order to improve the following deficits and impairments:   Body Structure / Function / Physical Skills: ADL, UE functional use, Fascial restriction, Pain, ROM, Scar mobility, Decreased knowledge of use of DME, Strength       Visit Diagnosis: Other symptoms and signs involving the musculoskeletal system  Acute pain of right shoulder  Stiffness of right shoulder, not elsewhere classified    Problem List Patient Active Problem List   Diagnosis Date Noted  . S/P  right rotator cuff repair open 09/19/18 repaired again 08/05/19 10/04/2018  . Complete tear of right rotator cuff   . Chronic pain of left knee   . Left shoulder pain 09/21/2015  . Disorders of bursae and tendons in shoulder region, unspecified 09/25/2013   Erykah Lippert, OTR/L,CBIS  [phone]  08/27/2019, 2:29 PM  [place]  [facility] [address] [place], [place], [address] Phone: [phone]   Fax:  [phone]  Name: Tina Pham MRN: [id number] Date of Birth: 25-Nov-1975

## 2019-08-27 NOTE — Progress Notes (Signed)
Chief Complaint  Patient presents with  . Post-op Problem    visible stitch 08/05/19   Encounter Diagnosis  Name Primary?  Marland Kitchen Aftercare following surgery Yes    2 nonabsorbed stitches visible recommend alcohol and Band-Aid allowed him to dissolve keep regular appointment  Meds ordered this encounter  Medications  . HYDROcodone-acetaminophen (NORCO) 7.5-325 MG tablet    Sig: Take 1 tablet by mouth every 6 (six) hours as needed for up to 7 days for moderate pain.    Dispense:  28 tablet    Refill:  0  . ibuprofen (ADVIL) 800 MG tablet    Sig: Take 1 tablet (800 mg total) by mouth every 8 (eight) hours as needed.    Dispense:  30 tablet    Refill:  0

## 2019-08-29 ENCOUNTER — Encounter (HOSPITAL_COMMUNITY): Payer: Self-pay

## 2019-08-29 ENCOUNTER — Other Ambulatory Visit: Payer: Self-pay

## 2019-08-29 ENCOUNTER — Ambulatory Visit (HOSPITAL_COMMUNITY): Payer: BLUE CROSS/BLUE SHIELD

## 2019-08-29 DIAGNOSIS — M25611 Stiffness of right shoulder, not elsewhere classified: Secondary | ICD-10-CM

## 2019-08-29 DIAGNOSIS — R29898 Other symptoms and signs involving the musculoskeletal system: Secondary | ICD-10-CM

## 2019-08-29 DIAGNOSIS — M25511 Pain in right shoulder: Secondary | ICD-10-CM

## 2019-08-29 NOTE — Therapy (Signed)
[place], [place], [address] Phone: [phone]   Fax:  [phone]  Occupational Therapy Treatment  Patient Details  Name: Tina Pham MRN: [id number] Date of Birth: 10/22/1975 Referring Provider (OT): Arther Abbott, MD   Encounter Date: 08/29/2019  OT End of Session - 08/29/19 1103    Visit Number  4    Number of Visits  16    Date for OT Re-Evaluation  10/01/19    Authorization Type  [employer] other    Authorization Time Period  No authorization needed. PT/OT 30 visit limit. 28 remain 11/22/18-11/21/19    Authorization - Visit Number  6    Authorization - Number of Visits  28    OT Start Time  9242   pt arrived late   OT Stop Time  1108    OT Time Calculation (min)  31 min    Activity Tolerance  Patient tolerated treatment well    Behavior During Therapy  WFL for tasks assessed/performed       Past Medical History:  Diagnosis Date  . Anxiety   . Arthritis   . Chronic back pain   . Chronic bronchitis ([place])   . Depression   . Diabetes mellitus    lost weight and is not on meds now.  . High cholesterol   . History of kidney stones   . Hypertension   . Migraine   . Neuropathic pain of foot   . Plantar fasciitis   . Sciatica   . Torn rotator cuff     Past Surgical History:  Procedure Laterality Date  . BACK SURGERY     herniated disc; had disc removed and then fusion; total of 3 surgeries  . EYE SURGERY Bilateral    removal of cyst and straightening of muscles  . KNEE ARTHROSCOPY WITH MEDIAL MENISECTOMY Left 12/27/2016   Procedure: LEFT KNEE DIAGNOSTIC ARTHROSCOPY;  Surgeon: Carole Civil, MD;  Location: [facility];  Service: Orthopedics;  Laterality: Left;  . SHOULDER OPEN ROTATOR CUFF REPAIR Right 09/19/2018   Procedure: ROTATOR CUFF REPAIR SHOULDER OPEN;  Surgeon: Carole Civil, MD;  Location: [facility];  Service: Orthopedics;  Laterality: Right;  . SHOULDER OPEN ROTATOR CUFF REPAIR  Right 08/05/2019   Procedure: ROTATOR CUFF REPAIR SHOULDER OPEN;  Surgeon: Carole Civil, MD;  Location: [facility];  Service: Orthopedics;  Laterality: Right;  . TUBAL LIGATION      There were no vitals filed for this visit.  Subjective Assessment - 08/29/19 1050    Subjective   S: I'm having a rough morning. I didn't sleep well last night.    Currently in Pain?  Yes    Pain Score  5     Pain Location  Shoulder    Pain Orientation  Right    Pain Descriptors / Indicators  Aching;Sore    Pain Type  Acute pain         [facility] OT Assessment - 08/29/19 1051      Assessment   Medical Diagnosis  Right RTC repair      Precautions   Precautions  Shoulder    Type of Shoulder Precautions  P/ROM for 4 weeks (09/02/19) Sling may be removed at 4 weeks. Progress to AA/ROM on 09/02/19. A/ROM on 09/30/19    Shoulder Interventions  Shoulder sling/immobilizer;At all times;Off for dressing/bathing/exercises               OT Treatments/Exercises (OP) - 08/29/19  1051      Exercises   Exercises  Shoulder      Shoulder Exercises: Seated   Extension  AROM;12 reps    Row  AROM;12 reps    Other Seated Exercises  scapular depression; 12X      Shoulder Exercises: Therapy Ball   Flexion  10 reps    ABduction  10 reps      Shoulder Exercises: ROM/Strengthening   Anterior Glide  3x10"    Caudal Glide  3x10"      Shoulder Exercises: Isometric Strengthening   Flexion  Supine;5X5"    Extension  Supine;5X5"    External Rotation  Supine;5X5"    Internal Rotation  Supine;5X5"    ABduction  Supine;5X5"    ADduction  Supine;5X5"      Manual Therapy   Manual Therapy  Myofascial release    Manual therapy comments  completed separately from therapeutic exercises    Myofascial Release  Myofascial release and manual stretching completed to right UE, upper trapezius, and scapularis region to decrease fascial restrictions and increase joint mobility in a pain free zone.                OT  Short Term Goals - 08/22/19 1037      OT SHORT TERM GOAL #1   Title  Patient will be educated and independent with HEP to faciliate progress in therapy and allow her to return to using her RUE as her dominant extremity for all daily and work activities.     Time  4    Period  Weeks    Status  On-going    Target Date  09/17/19      OT SHORT TERM GOAL #2   Title  Patient will increase RUE P/ROM to WNL in order to complete upper body dressing tasks with less difficulty.    Time  4    Period  Weeks    Status  On-going      OT SHORT TERM GOAL #3   Title  Pt will increase RUE strength to 3/5 in order to complete waist level tasks with less difficulty.    Time  4    Period  Weeks    Status  On-going      OT SHORT TERM GOAL #4   Title  Patient will report a decrease in pain level of 3/10 when completing daily tasks with her RUE.    Time  4    Period  Weeks    Status  On-going      OT SHORT TERM GOAL #5   Title  Patient will demonstrate fascial restrictions of trace amount in her RUE in order to increase functional mobility needed to complete reaching tasks.    Time  4    Period  Weeks    Status  On-going        OT Long Term Goals - 08/22/19 1037      OT LONG TERM GOAL #1   Title  Patient will return to highest level of independence while returning to work using her RUE for 75% or more of daily tasks.    Time  8    Period  Weeks    Status  On-going      OT LONG TERM GOAL #2   Title  Patient will increase A/ROM to WNL in order to increase ability to reach above shoulder level and into cabinets with less difficulty.    Time  8  Period  Weeks    Status  On-going      OT LONG TERM GOAL #3   Title  Patient will increase RUE strength to 4+/5 in order to lift normal houshold and work related items.    Time  8    Period  Weeks    Status  On-going      OT LONG TERM GOAL #4   Title  Patient will report a decrease in pain of approximately 2/10 or less when using her RUE for  daily tasks.    Time  8    Period  Weeks    Status  On-going            Plan - 08/29/19 1109    Clinical Impression Statement  A: Able to increase isometrics to 5x5" this session. Increased pain/soreness during session with manual techniques completed to address fascial restrictions in right upper trapezius area. VC for form and technique were provided as needed.    Body Structure / Function / Physical Skills  ADL;UE functional use;Fascial restriction;Pain;ROM;Scar mobility;Decreased knowledge of use of DME;Strength    Plan  P: May progress to AA/ROM as able to tolerate. Supine serratus anterior punch. Begin to wean from sling.    Consulted and Agree with Plan of Care  Patient       Patient will benefit from skilled therapeutic intervention in order to improve the following deficits and impairments:   Body Structure / Function / Physical Skills: ADL, UE functional use, Fascial restriction, Pain, ROM, Scar mobility, Decreased knowledge of use of DME, Strength       Visit Diagnosis: Other symptoms and signs involving the musculoskeletal system  Acute pain of right shoulder  Stiffness of right shoulder, not elsewhere classified    Problem List Patient Active Problem List   Diagnosis Date Noted  . S/P right rotator cuff repair open 09/19/18 repaired again 08/05/19 10/04/2018  . Complete tear of right rotator cuff   . Chronic pain of left knee   . Left shoulder pain 09/21/2015  . Disorders of bursae and tendons in shoulder region, unspecified 09/25/2013   Barby Colvard, OTR/L,CBIS  [phone]  08/29/2019, 11:11 AM  [facility] [address] [place], [place], [address] Phone: [phone]   Fax:  [phone]  Name: Tina Pham MRN: [id number] Date of Birth: Aug 19, 1975

## 2019-09-01 ENCOUNTER — Other Ambulatory Visit: Payer: Self-pay

## 2019-09-01 DIAGNOSIS — Z4889 Encounter for other specified surgical aftercare: Secondary | ICD-10-CM

## 2019-09-01 MED ORDER — HYDROCODONE-ACETAMINOPHEN 7.5-325 MG PO TABS: 1.0000 | ORAL_TABLET | Freq: Four times a day (QID) | ORAL | 0 refills | Status: DC | PRN

## 2019-09-02 ENCOUNTER — Telehealth (HOSPITAL_COMMUNITY): Payer: Self-pay

## 2019-09-02 NOTE — Telephone Encounter (Signed)
pt called to cx this appt due to she has had death in her family

## 2019-09-03 ENCOUNTER — Ambulatory Visit (HOSPITAL_COMMUNITY): Payer: BLUE CROSS/BLUE SHIELD

## 2019-09-04 ENCOUNTER — Telehealth (HOSPITAL_COMMUNITY): Payer: Self-pay

## 2019-09-04 NOTE — Telephone Encounter (Signed)
pt cancelled appt on 2/12 because she has her Uncle funeral to attend

## 2019-09-05 ENCOUNTER — Other Ambulatory Visit: Payer: Self-pay | Admitting: Orthopedic Surgery

## 2019-09-05 ENCOUNTER — Ambulatory Visit (HOSPITAL_COMMUNITY): Payer: BLUE CROSS/BLUE SHIELD

## 2019-09-05 DIAGNOSIS — Z4889 Encounter for other specified surgical aftercare: Secondary | ICD-10-CM

## 2019-09-07 ENCOUNTER — Other Ambulatory Visit: Payer: Self-pay

## 2019-09-07 DIAGNOSIS — Z4889 Encounter for other specified surgical aftercare: Secondary | ICD-10-CM

## 2019-09-08 MED ORDER — HYDROCODONE-ACETAMINOPHEN 7.5-325 MG PO TABS: 1.0000 | ORAL_TABLET | Freq: Four times a day (QID) | ORAL | 0 refills | Status: DC | PRN

## 2019-09-10 ENCOUNTER — Ambulatory Visit (HOSPITAL_COMMUNITY): Payer: BLUE CROSS/BLUE SHIELD

## 2019-09-10 ENCOUNTER — Telehealth (HOSPITAL_COMMUNITY): Payer: Self-pay

## 2019-09-12 ENCOUNTER — Encounter (HOSPITAL_COMMUNITY): Payer: Self-pay

## 2019-09-12 ENCOUNTER — Other Ambulatory Visit: Payer: Self-pay

## 2019-09-12 ENCOUNTER — Ambulatory Visit (HOSPITAL_COMMUNITY): Payer: BLUE CROSS/BLUE SHIELD

## 2019-09-12 DIAGNOSIS — R29898 Other symptoms and signs involving the musculoskeletal system: Secondary | ICD-10-CM

## 2019-09-12 DIAGNOSIS — M25611 Stiffness of right shoulder, not elsewhere classified: Secondary | ICD-10-CM

## 2019-09-12 DIAGNOSIS — M25511 Pain in right shoulder: Secondary | ICD-10-CM
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
## 2019-09-12 NOTE — Patient Instructions (Signed)
Perform each exercise ____10-15____ reps. 2-3x days.   Protraction - STANDING  Start by holding a wand or cane at chest height.  Next, slowly push the wand outwards in front of your body so that your elbows become fully straightened. Then, return to the original position.     Shoulder FLEXION - STANDING - PALMS Down  In the standing position, hold a wand/cane with both arms, palms down on both sides. Raise up the wand/cane allowing your unaffected arm to perform most of the effort. Your affected arm should be partially relaxed.      Internal/External ROTATION - STANDING  In the standing position, hold a wand/cane with both hands keeping your elbows bent. Move your arms and wand/cane to one side.  Your affected arm should be partially relaxed while your unaffected arm performs most of the effort.    * Go to the right side only.   Shoulder ABDUCTION - STANDING  While holding a wand/cane palm face up on the injured side and palm face down on the uninjured side, slowly raise up your injured arm to the side.        Horizontal Abduction/Adduction      Straight arms holding cane at shoulder height, bring cane to right, center, left. Repeat starting to left.   Copyright  VHI. All rights reserved.

## 2019-09-14 ENCOUNTER — Other Ambulatory Visit: Payer: Self-pay

## 2019-09-14 DIAGNOSIS — Z4889 Encounter for other specified surgical aftercare: Secondary | ICD-10-CM

## 2019-09-15 ENCOUNTER — Other Ambulatory Visit: Payer: Self-pay | Admitting: Orthopedic Surgery

## 2019-09-15 DIAGNOSIS — Z4889 Encounter for other specified surgical aftercare: Secondary | ICD-10-CM

## 2019-09-15 MED ORDER — HYDROCODONE-ACETAMINOPHEN 7.5-325 MG PO TABS: 1.0000 | ORAL_TABLET | Freq: Four times a day (QID) | ORAL | 0 refills | Status: DC | PRN

## 2019-09-17 ENCOUNTER — Ambulatory Visit (INDEPENDENT_AMBULATORY_CARE_PROVIDER_SITE_OTHER): Payer: BLUE CROSS/BLUE SHIELD | Admitting: Orthopedic Surgery

## 2019-09-17 ENCOUNTER — Other Ambulatory Visit: Payer: Self-pay

## 2019-09-17 ENCOUNTER — Ambulatory Visit (HOSPITAL_COMMUNITY): Payer: BLUE CROSS/BLUE SHIELD

## 2019-09-17 ENCOUNTER — Encounter: Payer: Self-pay | Admitting: Orthopedic Surgery

## 2019-09-17 ENCOUNTER — Encounter (HOSPITAL_COMMUNITY): Payer: Self-pay

## 2019-09-17 VITALS — BP 128/86 | HR 82 | Temp 98.0°F | Ht 68.0 in | Wt 232.0 lb

## 2019-09-17 DIAGNOSIS — R29898 Other symptoms and signs involving the musculoskeletal system: Secondary | ICD-10-CM | POA: Diagnosis not present

## 2019-09-17 DIAGNOSIS — M25511 Pain in right shoulder: Secondary | ICD-10-CM

## 2019-09-17 DIAGNOSIS — Z9889 Other specified postprocedural states: Secondary | ICD-10-CM

## 2019-09-17 DIAGNOSIS — M25611 Stiffness of right shoulder, not elsewhere classified: Secondary | ICD-10-CM

## 2019-09-17 MED ORDER — TIZANIDINE HCL 4 MG PO TABS: 4.0000 mg | ORAL_TABLET | Freq: Three times a day (TID) | ORAL | 1 refills | Status: DC

## 2019-09-17 NOTE — Progress Notes (Signed)
Chief Complaint  Patient presents with  . Follow-up    Recheck on right RCR, DOS 08-05-19.   Revision rotator cuff repair right shoulder  Postop week #6.  She complains of an occasional shooting pain from her neck to her hand and some weakness of the right arm  However she is doing great according to her therapy notes and on exam we can externally rotate her arm with the arm at her side a full 50 degrees of abduction passively is 90 degrees in the office  She wanted her Zanaflex refilled her work note extended which is fine  I will see her in 4 weeks she is doing great  Meds ordered this encounter  Medications  . tiZANidine (ZANAFLEX) 4 MG tablet    Sig: Take 1 tablet (4 mg total) by mouth 3 (three) times daily.    Dispense:  90 tablet    Refill:  1     AROM     Overall AROM Comments  Assessed standing. A/ROM not assessed prior to this session. IR/er adducted     AROM Assessment Site  Shoulder     Right/Left Shoulder  Right     Right Shoulder Flexion  120 Degrees     Right Shoulder ABduction  91 Degrees     Right Shoulder Internal Rotation  90 Degrees     Right Shoulder External Rotation  90 Degrees          PROM    Overall PROM   Within functional limits for tasks performed     Overall PROM Comments  Assessed supine. IR/er adducted     PROM Assessment Site  Shoulder     Right/Left Shoulder  Right     Right Shoulder Flexion  170 Degrees   previous: 130    Right Shoulder ABduction  170 Degrees   previous: 135    Right Shoulder Internal Rotation  90 Degrees   previous: same    Right Shoulder External Rotation  90 Degrees   previous: 65         Strength    Overall Strength Comments  Assessed standing. IR/er adducted. Strength not assessed prior to this date.      Strength Assessment Site  Shoulder     Right/Left Shoulder  Right     Right Shoulder Flexion  3/5     Right Shoulder ABduction  3-/5     Right Shoulder Internal Rotation  3/5     Right Shoulder External  Rotation  3/5

## 2019-09-17 NOTE — Therapy (Signed)
[facility] [address] [place], [place], [address] Phone: [phone]   Fax:  [phone]  Occupational Therapy Treatment  Patient Details  Name: Tina Pham MRN: [id number] Date of Birth: 1976/07/03 Referring Provider (OT): Fuller Canada, MD   Encounter Date: 09/17/2019  OT End of Session - 09/17/19 1343    Visit Number  6    Number of Visits  16    Date for OT Re-Evaluation  10/01/19    Authorization Type  [employer] other    Authorization Time Period  No authorization needed. PT/OT 30 visit limit. 28 remain 11/22/18-11/21/19    Authorization - Visit Number  8    Authorization - Number of Visits  28    OT Start Time  1305   Pt arrived late   OT Stop Time  1340    OT Time Calculation (min)  35 min    Activity Tolerance  Patient tolerated treatment well    Behavior During Therapy  WFL for tasks assessed/performed       Past Medical History:  Diagnosis Date  . Anxiety   . Arthritis   . Chronic back pain   . Chronic bronchitis (HCC)   . Depression   . Diabetes mellitus    lost weight and is not on meds now.  . High cholesterol   . History of kidney stones   . Hypertension   . Migraine   . Neuropathic pain of foot   . Plantar fasciitis   . Sciatica   . Torn rotator cuff     Past Surgical History:  Procedure Laterality Date  . BACK SURGERY     herniated disc; had disc removed and then fusion; total of 3 surgeries  . EYE SURGERY Bilateral    removal of cyst and straightening of muscles  . KNEE ARTHROSCOPY WITH MEDIAL MENISECTOMY Left 12/27/2016   Procedure: LEFT KNEE DIAGNOSTIC ARTHROSCOPY;  Surgeon: Vickki Hearing, MD;  Location: [facility];  Service: Orthopedics;  Laterality: Left;  . SHOULDER OPEN ROTATOR CUFF REPAIR Right 09/19/2018   Procedure: ROTATOR CUFF REPAIR SHOULDER OPEN;  Surgeon: Vickki Hearing, MD;  Location: [facility];  Service: Orthopedics;  Laterality: Right;  . SHOULDER OPEN ROTATOR CUFF REPAIR  Right 08/05/2019   Procedure: ROTATOR CUFF REPAIR SHOULDER OPEN;  Surgeon: Vickki Hearing, MD;  Location: [facility];  Service: Orthopedics;  Laterality: Right;  . TUBAL LIGATION      There were no vitals filed for this visit.  Subjective Assessment - 09/17/19 1328    Subjective   S: Dr. Romeo Apple was pleased. He's keeping me out of work for another month.    Currently in Pain?  Yes    Pain Score  6     Pain Location  Shoulder    Pain Orientation  Right    Pain Descriptors / Indicators  Sore;Aching    Pain Type  Acute pain         [facility] OT Assessment - 09/17/19 1329      Assessment   Medical Diagnosis  Right RTC repair      Precautions   Precautions  Shoulder    Type of Shoulder Precautions  P/ROM for 4 weeks (09/02/19) Sling may be removed at 4 weeks. Progress to AA/ROM on 09/02/19. A/ROM on 09/30/19               OT Treatments/Exercises (OP) - 09/17/19 1329      Exercises   Exercises  Shoulder      Shoulder Exercises: Supine   Protraction  PROM;5 reps;AAROM;10 reps    Horizontal ABduction  PROM;5 reps;AAROM;10 reps    External Rotation  PROM;5 reps;AAROM;10 reps    Internal Rotation  PROM;5 reps;AAROM;10 reps    Flexion  PROM;5 reps;AAROM;10 reps    ABduction  PROM;5 reps;AAROM;10 reps      Shoulder Exercises: Standing   Protraction  AAROM;10 reps    Horizontal ABduction  AAROM;10 reps    External Rotation  AAROM;10 reps    Internal Rotation  AAROM;10 reps    Flexion  AAROM;10 reps    ABduction  AAROM;10 reps      Manual Therapy   Manual Therapy  Myofascial release    Manual therapy comments  completed separately from therapeutic exercises    Myofascial Release  Myofascial release and manual stretching completed to right UE, upper trapezius, and scapularis region to decrease fascial restrictions and increase joint mobility in a pain free zone.                OT Short Term Goals - 08/22/19 1037      OT SHORT TERM GOAL #1   Title  Patient will be  educated and independent with HEP to faciliate progress in therapy and allow her to return to using her RUE as her dominant extremity for all daily and work activities.     Time  4    Period  Weeks    Status  On-going    Target Date  09/17/19      OT SHORT TERM GOAL #2   Title  Patient will increase RUE P/ROM to WNL in order to complete upper body dressing tasks with less difficulty.    Time  4    Period  Weeks    Status  On-going      OT SHORT TERM GOAL #3   Title  Pt will increase RUE strength to 3/5 in order to complete waist level tasks with less difficulty.    Time  4    Period  Weeks    Status  On-going      OT SHORT TERM GOAL #4   Title  Patient will report a decrease in pain level of 3/10 when completing daily tasks with her RUE.    Time  4    Period  Weeks    Status  On-going      OT SHORT TERM GOAL #5   Title  Patient will demonstrate fascial restrictions of trace amount in her RUE in order to increase functional mobility needed to complete reaching tasks.    Time  4    Period  Weeks    Status  On-going        OT Long Term Goals - 08/22/19 1037      OT LONG TERM GOAL #1   Title  Patient will return to highest level of independence while returning to work using her RUE for 75% or more of daily tasks.    Time  8    Period  Weeks    Status  On-going      OT LONG TERM GOAL #2   Title  Patient will increase A/ROM to WNL in order to increase ability to reach above shoulder level and into cabinets with less difficulty.    Time  8    Period  Weeks    Status  On-going      OT LONG TERM GOAL #3  Title  Patient will increase RUE strength to 4+/5 in order to lift normal houshold and work related items.    Time  8    Period  Weeks    Status  On-going      OT LONG TERM GOAL #4   Title  Patient will report a decrease in pain of approximately 2/10 or less when using her RUE for daily tasks.    Time  8    Period  Weeks    Status  On-going            Plan -  09/17/19 1344    Clinical Impression Statement  A: Pt's MD appointment went well. She is to remain out of work for 4 more weeks. She reports continued shoulder soreness this date. Required more focus on decreasing fascial restrictions using manual techniques this session. VC for form and technique were provided. Did not add new exercises this date due to time constraint.    Body Structure / Function / Physical Skills  ADL;UE functional use;Fascial restriction;Pain;ROM;Scar mobility;Decreased knowledge of use of DME;Strength    Plan  P: Continue with AA/ROM. Add pulleys, wall wash, PVC pipe slide.    Consulted and Agree with Plan of Care  Patient       Patient will benefit from skilled therapeutic intervention in order to improve the following deficits and impairments:   Body Structure / Function / Physical Skills: ADL, UE functional use, Fascial restriction, Pain, ROM, Scar mobility, Decreased knowledge of use of DME, Strength       Visit Diagnosis: Other symptoms and signs involving the musculoskeletal system  Stiffness of right shoulder, not elsewhere classified  Acute pain of right shoulder    Problem List Patient Active Problem List   Diagnosis Date Noted  . S/P right rotator cuff repair open 09/19/18 repaired again 08/05/19 10/04/2018  . Complete tear of right rotator cuff   . Chronic pain of left knee   . Left shoulder pain 09/21/2015  . Disorders of bursae and tendons in shoulder region, unspecified 09/25/2013   Elliemae Braman, OTR/L,CBIS  [phone]  09/17/2019, 2:05 PM  [facility] [address] [place], [place], [address] Phone: [phone]   Fax:  [phone]  Name: Tina Pham MRN: [id number] Date of Birth: February 20, 1976

## 2019-09-19 ENCOUNTER — Other Ambulatory Visit: Payer: Self-pay

## 2019-09-19 ENCOUNTER — Encounter (HOSPITAL_COMMUNITY): Payer: Self-pay

## 2019-09-19 ENCOUNTER — Ambulatory Visit (HOSPITAL_COMMUNITY): Payer: BLUE CROSS/BLUE SHIELD

## 2019-09-19 DIAGNOSIS — R29898 Other symptoms and signs involving the musculoskeletal system: Secondary | ICD-10-CM

## 2019-09-19 DIAGNOSIS — M25511 Pain in right shoulder: Secondary | ICD-10-CM

## 2019-09-19 DIAGNOSIS — M25611 Stiffness of right shoulder, not elsewhere classified: Secondary | ICD-10-CM

## 2019-09-19 NOTE — Therapy (Signed)
[place] [facility] [address] [place], [place], [address] Phone: [phone]   Fax:  [phone]  Occupational Therapy Treatment  Patient Details  Name: Tina Pham MRN: [id number] Date of Birth: [birth year] Referring Provider (OT): Fuller Canada, MD   Encounter Date: 09/19/2019  OT End of Session - 09/19/19 1103    Visit Number  7    Number of Visits  16    Date for OT Re-Evaluation  10/01/19    Authorization Type  [employer] other    Authorization Time Period  No authorization needed. PT/OT 30 visit limit. 28 remain 11/22/18-11/21/19    Authorization - Visit Number  9    Authorization - Number of Visits  28    OT Start Time  1034    OT Stop Time  1108    OT Time Calculation (min)  34 min    Activity Tolerance  Patient tolerated treatment well    Behavior During Therapy  WFL for tasks assessed/performed       Past Medical History:  Diagnosis Date  . Anxiety   . Arthritis   . Chronic back pain   . Chronic bronchitis (HCC)   . Depression   . Diabetes mellitus    lost weight and is not on meds now.  . High cholesterol   . History of kidney stones   . Hypertension   . Migraine   . Neuropathic pain of foot   . Plantar fasciitis   . Sciatica   . Torn rotator cuff     Past Surgical History:  Procedure Laterality Date  . BACK SURGERY     herniated disc; had disc removed and then fusion; total of 3 surgeries  . EYE SURGERY Bilateral    removal of cyst and straightening of muscles  . KNEE ARTHROSCOPY WITH MEDIAL MENISECTOMY Left 12/27/2016   Procedure: LEFT KNEE DIAGNOSTIC ARTHROSCOPY;  Surgeon: Vickki Hearing, MD;  Location: [facility];  Service: Orthopedics;  Laterality: Left;  . SHOULDER OPEN ROTATOR CUFF REPAIR Right 09/19/2018   Procedure: ROTATOR CUFF REPAIR SHOULDER OPEN;  Surgeon: Vickki Hearing, MD;  Location: [facility];  Service: Orthopedics;  Laterality: Right;  . SHOULDER OPEN ROTATOR CUFF REPAIR Right 08/05/2019    Procedure: ROTATOR CUFF REPAIR SHOULDER OPEN;  Surgeon: Vickki Hearing, MD;  Location: [facility];  Service: Orthopedics;  Laterality: Right;  . TUBAL LIGATION      There were no vitals filed for this visit.  Subjective Assessment - 09/19/19 1045    Subjective   S: It's not as sore as it was the other day.    Currently in Pain?  Yes    Pain Score  4     Pain Location  Shoulder    Pain Orientation  Right    Pain Descriptors / Indicators  Aching;Sore    Pain Type  Acute pain    Pain Radiating Towards  N/A    Pain Onset  Today    Pain Frequency  Intermittent    Aggravating Factors   movement and use    Pain Relieving Factors  pain medication and heat    Effect of Pain on Daily Activities  mod-max effect    Multiple Pain Sites  No         [facility] OT Assessment - 09/19/19 1047      Assessment   Medical Diagnosis  Right RTC repair      Precautions   Precautions  Shoulder  Type of Shoulder Precautions  P/ROM for 4 weeks (09/02/19) Sling may be removed at 4 weeks. Progress to AA/ROM on 09/02/19. A/ROM on 09/30/19               OT Treatments/Exercises (OP) - 09/19/19 1047      Exercises   Exercises  Shoulder      Shoulder Exercises: Supine   Protraction  PROM;5 reps;AAROM;12 reps    Horizontal ABduction  PROM;5 reps;AAROM;12 reps    External Rotation  PROM;5 reps;AAROM;12 reps    Internal Rotation  PROM;5 reps;AAROM;12 reps    Flexion  PROM;5 reps;AAROM;12 reps    ABduction  PROM;5 reps;AAROM;12 reps      Shoulder Exercises: Standing   Protraction  AAROM;12 reps    Horizontal ABduction  AAROM;12 reps    External Rotation  AAROM;12 reps    Internal Rotation  AAROM;12 reps    Flexion  AAROM;12 reps    ABduction  AAROM;12 reps      Shoulder Exercises: Pulleys   Flexion  1 minute   standing   ABduction  1 minute   standing     Shoulder Exercises: ROM/Strengthening   Wall Wash  1'    Other ROM/Strengthening Exercises  PVC pipe slide; 10X                OT Short Term Goals - 08/22/19 1037      OT SHORT TERM GOAL #1   Title  Patient will be educated and independent with HEP to faciliate progress in therapy and allow her to return to using her RUE as her dominant extremity for all daily and work activities.     Time  4    Period  Weeks    Status  On-going    Target Date  09/17/19      OT SHORT TERM GOAL #2   Title  Patient will increase RUE P/ROM to WNL in order to complete upper body dressing tasks with less difficulty.    Time  4    Period  Weeks    Status  On-going      OT SHORT TERM GOAL #3   Title  Pt will increase RUE strength to 3/5 in order to complete waist level tasks with less difficulty.    Time  4    Period  Weeks    Status  On-going      OT SHORT TERM GOAL #4   Title  Patient will report a decrease in pain level of 3/10 when completing daily tasks with her RUE.    Time  4    Period  Weeks    Status  On-going      OT SHORT TERM GOAL #5   Title  Patient will demonstrate fascial restrictions of trace amount in her RUE in order to increase functional mobility needed to complete reaching tasks.    Time  4    Period  Weeks    Status  On-going        OT Long Term Goals - 08/22/19 1037      OT LONG TERM GOAL #1   Title  Patient will return to highest level of independence while returning to work using her RUE for 75% or more of daily tasks.    Time  8    Period  Weeks    Status  On-going      OT LONG TERM GOAL #2   Title  Patient will increase A/ROM to WNL  in order to increase ability to reach above shoulder level and into cabinets with less difficulty.    Time  8    Period  Weeks    Status  On-going      OT LONG TERM GOAL #3   Title  Patient will increase RUE strength to 4+/5 in order to lift normal houshold and work related items.    Time  8    Period  Weeks    Status  On-going      OT LONG TERM GOAL #4   Title  Patient will report a decrease in pain of approximately 2/10 or less  when using her RUE for daily tasks.    Time  8    Period  Weeks    Status  On-going            Plan - 09/19/19 1113    Clinical Impression Statement  A: Omitted myofascial release this session to see if that would help decrease the pain level. Encouraged instead for patient to use heating pad once she returns home after session. Offered hot pack at end of session and patient declined. Focused session on AA/ROM while adding pvc pipe slide, wall wash, and pulleys. Pt was able to increase AA/ROM supine and standing repetitions to 12. VC for form and technique. Manual techniques completed to address.    Body Structure / Function / Physical Skills  ADL;UE functional use;Fascial restriction;Pain;ROM;Scar mobility;Decreased knowledge of use of DME;Strength    Plan  P: Continue with AA/ROM. Scapular theraband with red band.    Consulted and Agree with Plan of Care  Patient       Patient will benefit from skilled therapeutic intervention in order to improve the following deficits and impairments:   Body Structure / Function / Physical Skills: ADL, UE functional use, Fascial restriction, Pain, ROM, Scar mobility, Decreased knowledge of use of DME, Strength       Visit Diagnosis: Other symptoms and signs involving the musculoskeletal system  Stiffness of right shoulder, not elsewhere classified  Acute pain of right shoulder    Problem List Patient Active Problem List   Diagnosis Date Noted  . S/P right rotator cuff repair open 09/19/18 repaired again 08/05/19 10/04/2018  . Complete tear of right rotator cuff   . Chronic pain of left knee   . Left shoulder pain 09/21/2015  . Disorders of bursae and tendons in shoulder region, unspecified 09/25/2013   Skyla Champagne, OTR/L,CBIS  [phone]  09/19/2019, 11:15 AM  [place] [facility] [address] [place], [place], [address] Phone: [phone]   Fax:  [phone]  Name: MAEVYN RIORDAN MRN: [id number] Date of Birth: Oct 17, 1975

## 2019-09-21 ENCOUNTER — Other Ambulatory Visit: Payer: Self-pay

## 2019-09-21 DIAGNOSIS — Z4889 Encounter for other specified surgical aftercare: Secondary | ICD-10-CM

## 2019-09-22 ENCOUNTER — Other Ambulatory Visit: Payer: Self-pay

## 2019-09-22 DIAGNOSIS — Z4889 Encounter for other specified surgical aftercare: Secondary | ICD-10-CM

## 2019-09-22 MED ORDER — HYDROCODONE-ACETAMINOPHEN 7.5-325 MG PO TABS: 1.0000 | ORAL_TABLET | Freq: Four times a day (QID) | ORAL | 0 refills | Status: DC | PRN

## 2019-09-24 ENCOUNTER — Encounter (HOSPITAL_COMMUNITY): Payer: Self-pay

## 2019-09-24 ENCOUNTER — Ambulatory Visit (HOSPITAL_COMMUNITY): Payer: BLUE CROSS/BLUE SHIELD | Attending: Orthopedic Surgery

## 2019-09-24 ENCOUNTER — Other Ambulatory Visit: Payer: Self-pay | Admitting: Orthopedic Surgery

## 2019-09-24 ENCOUNTER — Other Ambulatory Visit: Payer: Self-pay

## 2019-09-24 DIAGNOSIS — Z4889 Encounter for other specified surgical aftercare: Secondary | ICD-10-CM

## 2019-09-24 DIAGNOSIS — M25511 Pain in right shoulder: Secondary | ICD-10-CM | POA: Diagnosis present

## 2019-09-24 DIAGNOSIS — M25611 Stiffness of right shoulder, not elsewhere classified: Secondary | ICD-10-CM | POA: Diagnosis present

## 2019-09-24 DIAGNOSIS — R29898 Other symptoms and signs involving the musculoskeletal system: Secondary | ICD-10-CM | POA: Diagnosis not present

## 2019-09-24 NOTE — Therapy (Signed)
[place] [facility] [address] [place], [place], [address] Phone: [phone]   Fax:  [phone]  Occupational Therapy Treatment  Patient Details  Name: Tina Pham MRN: [id number] Date of Birth: January 09, 1976 Referring Provider (OT): Fuller Canada, MD   Encounter Date: 09/24/2019  OT End of Session - 09/24/19 1339    Visit Number  8    Number of Visits  16    Date for OT Re-Evaluation  10/01/19    Authorization Type  [employer] other    Authorization Time Period  No authorization needed. PT/OT 30 visit limit. 28 remain 11/22/18-11/21/19    Authorization - Visit Number  10    Authorization - Number of Visits  28    OT Start Time  1307    OT Stop Time  1340    OT Time Calculation (min)  33 min    Activity Tolerance  Patient tolerated treatment well    Behavior During Therapy  WFL for tasks assessed/performed       Past Medical History:  Diagnosis Date  . Anxiety   . Arthritis   . Chronic back pain   . Chronic bronchitis (HCC)   . Depression   . Diabetes mellitus    lost weight and is not on meds now.  . High cholesterol   . History of kidney stones   . Hypertension   . Migraine   . Neuropathic pain of foot   . Plantar fasciitis   . Sciatica   . Torn rotator cuff     Past Surgical History:  Procedure Laterality Date  . BACK SURGERY     herniated disc; had disc removed and then fusion; total of 3 surgeries  . EYE SURGERY Bilateral    removal of cyst and straightening of muscles  . KNEE ARTHROSCOPY WITH MEDIAL MENISECTOMY Left 12/27/2016   Procedure: LEFT KNEE DIAGNOSTIC ARTHROSCOPY;  Surgeon: Vickki Hearing, MD;  Location: [facility];  Service: Orthopedics;  Laterality: Left;  . SHOULDER OPEN ROTATOR CUFF REPAIR Right 09/19/2018   Procedure: ROTATOR CUFF REPAIR SHOULDER OPEN;  Surgeon: Vickki Hearing, MD;  Location: [facility];  Service: Orthopedics;  Laterality: Right;  . SHOULDER OPEN ROTATOR CUFF REPAIR Right 08/05/2019   Procedure: ROTATOR CUFF REPAIR SHOULDER OPEN;  Surgeon: Vickki Hearing, MD;  Location: [facility];  Service: Orthopedics;  Laterality: Right;  . TUBAL LIGATION      There were no vitals filed for this visit.  Subjective Assessment - 09/24/19 1317    Subjective   S: It wasn't as sore without the massage like I thought it would be.    Currently in Pain?  Yes    Pain Score  5     Pain Location  Shoulder    Pain Orientation  Right    Pain Descriptors / Indicators  Aching;Sore    Pain Type  Acute pain         [facility] OT Assessment - 09/24/19 1318      Assessment   Medical Diagnosis  Right RTC repair      Precautions   Precautions  Shoulder    Type of Shoulder Precautions  P/ROM for 4 weeks (09/02/19) Sling may be removed at 4 weeks. Progress to AA/ROM on 09/02/19. A/ROM on 09/30/19               OT Treatments/Exercises (OP) - 09/24/19 1318      Exercises   Exercises  Shoulder  Shoulder Exercises: Supine   Protraction  PROM;5 reps;AAROM;12 reps    Horizontal ABduction  PROM;5 reps;AAROM;12 reps    External Rotation  PROM;5 reps;AAROM;12 reps    Internal Rotation  PROM;5 reps;AAROM;12 reps    Flexion  PROM;5 reps;AAROM;12 reps    ABduction  PROM;5 reps;AAROM;12 reps      Shoulder Exercises: Standing   Protraction  AAROM;12 reps    Horizontal ABduction  AAROM;12 reps    External Rotation  AAROM;12 reps    Internal Rotation  AAROM;12 reps    Flexion  AAROM;12 reps    ABduction  AAROM;12 reps    Extension  Theraband;10 reps    Theraband Level (Shoulder Extension)  Level 2 (Red)    Row  Theraband;10 reps    Theraband Level (Shoulder Row)  Level 2 (Red)    Retraction  Theraband;10 reps    Theraband Level (Shoulder Retraction)  Level 2 (Red)      Shoulder Exercises: Pulleys   ABduction  1 minute   standing     Shoulder Exercises: ROM/Strengthening   Wall Wash  1'    Proximal Shoulder Strengthening, Supine  10X A/ROM no rest breaks    Proximal Shoulder  Strengthening, Seated  10X A/ROM no rest breaks               OT Short Term Goals - 08/22/19 1037      OT SHORT TERM GOAL #1   Title  Patient will be educated and independent with HEP to faciliate progress in therapy and allow her to return to using her RUE as her dominant extremity for all daily and work activities.     Time  4    Period  Weeks    Status  On-going    Target Date  09/17/19      OT SHORT TERM GOAL #2   Title  Patient will increase RUE P/ROM to WNL in order to complete upper body dressing tasks with less difficulty.    Time  4    Period  Weeks    Status  On-going      OT SHORT TERM GOAL #3   Title  Pt will increase RUE strength to 3/5 in order to complete waist level tasks with less difficulty.    Time  4    Period  Weeks    Status  On-going      OT SHORT TERM GOAL #4   Title  Patient will report a decrease in pain level of 3/10 when completing daily tasks with her RUE.    Time  4    Period  Weeks    Status  On-going      OT SHORT TERM GOAL #5   Title  Patient will demonstrate fascial restrictions of trace amount in her RUE in order to increase functional mobility needed to complete reaching tasks.    Time  4    Period  Weeks    Status  On-going        OT Long Term Goals - 08/22/19 1037      OT LONG TERM GOAL #1   Title  Patient will return to highest level of independence while returning to work using her RUE for 75% or more of daily tasks.    Time  8    Period  Weeks    Status  On-going      OT LONG TERM GOAL #2   Title  Patient will increase A/ROM to WNL in  order to increase ability to reach above shoulder level and into cabinets with less difficulty.    Time  8    Period  Weeks    Status  On-going      OT LONG TERM GOAL #3   Title  Patient will increase RUE strength to 4+/5 in order to lift normal houshold and work related items.    Time  8    Period  Weeks    Status  On-going      OT LONG TERM GOAL #4   Title  Patient will  report a decrease in pain of approximately 2/10 or less when using her RUE for daily tasks.    Time  8    Period  Weeks    Status  On-going            Plan - 09/24/19 1343    Clinical Impression Statement  A: Refrained from myofascial release again as patient reports just mild soreness when it was omitted last session. Continued with AA/ROM exercises supine and standing. Added scapular strengthening with theraband. Provided VC for form and technique. Mild soreness reported during session.    Body Structure / Function / Physical Skills  ADL;UE functional use;Fascial restriction;Pain;ROM;Scar mobility;Decreased knowledge of use of DME;Strength    Plan  P: Increase to 15 repetitions for AA/ROM exericses. Provided scapular theraband to HEP.    Consulted and Agree with Plan of Care  Patient       Patient will benefit from skilled therapeutic intervention in order to improve the following deficits and impairments:   Body Structure / Function / Physical Skills: ADL, UE functional use, Fascial restriction, Pain, ROM, Scar mobility, Decreased knowledge of use of DME, Strength       Visit Diagnosis: Other symptoms and signs involving the musculoskeletal system  Stiffness of right shoulder, not elsewhere classified  Acute pain of right shoulder    Problem List Patient Active Problem List   Diagnosis Date Noted  . S/P right rotator cuff repair open 09/19/18 repaired again 08/05/19 10/04/2018  . Complete tear of right rotator cuff   . Chronic pain of left knee   . Left shoulder pain 09/21/2015  . Disorders of bursae and tendons in shoulder region, unspecified 09/25/2013   Jalaila Caradonna, OTR/L,CBIS  [phone]  09/24/2019, 1:48 PM  [place] [facility] [address] [place], [place], [address] Phone: [phone]   Fax:  [phone]  Name: Tina Pham MRN: [id number] Date of Birth: Jun 01, 1976

## 2019-09-26 ENCOUNTER — Other Ambulatory Visit: Payer: Self-pay

## 2019-09-26 ENCOUNTER — Ambulatory Visit (HOSPITAL_COMMUNITY): Payer: BLUE CROSS/BLUE SHIELD

## 2019-09-26 ENCOUNTER — Encounter (HOSPITAL_COMMUNITY): Payer: Self-pay

## 2019-09-26 DIAGNOSIS — M25511 Pain in right shoulder: Secondary | ICD-10-CM

## 2019-09-26 DIAGNOSIS — M25611 Stiffness of right shoulder, not elsewhere classified: Secondary | ICD-10-CM

## 2019-09-26 DIAGNOSIS — R29898 Other symptoms and signs involving the musculoskeletal system: Secondary | ICD-10-CM

## 2019-09-26 NOTE — Therapy (Signed)
[facility] [address] [place], [place], [address] Phone: [phone]   Fax:  [phone]  Occupational Therapy Treatment  Patient Details  Name: Tina Pham MRN: [id number] Date of Birth: February 21, 1976 Referring Provider (OT): Fuller Canada, MD   Encounter Date: 09/26/2019  OT End of Session - 09/26/19 1113    Visit Number  9    Number of Visits  16    Date for OT Re-Evaluation  10/01/19    Authorization Type  [employer] other    Authorization Time Period  No authorization needed. PT/OT 30 visit limit. 28 remain 11/22/18-11/21/19    Authorization - Visit Number  11    Authorization - Number of Visits  28    OT Start Time  1036    OT Stop Time  1108    OT Time Calculation (min)  32 min    Activity Tolerance  Patient tolerated treatment well    Behavior During Therapy  WFL for tasks assessed/performed       Past Medical History:  Diagnosis Date  . Anxiety   . Arthritis   . Chronic back pain   . Chronic bronchitis (HCC)   . Depression   . Diabetes mellitus    lost weight and is not on meds now.  . High cholesterol   . History of kidney stones   . Hypertension   . Migraine   . Neuropathic pain of foot   . Plantar fasciitis   . Sciatica   . Torn rotator cuff     Past Surgical History:  Procedure Laterality Date  . BACK SURGERY     herniated disc; had disc removed and then fusion; total of 3 surgeries  . EYE SURGERY Bilateral    removal of cyst and straightening of muscles  . KNEE ARTHROSCOPY WITH MEDIAL MENISECTOMY Left 12/27/2016   Procedure: LEFT KNEE DIAGNOSTIC ARTHROSCOPY;  Surgeon: Vickki Hearing, MD;  Location: [facility];  Service: Orthopedics;  Laterality: Left;  . SHOULDER OPEN ROTATOR CUFF REPAIR Right 09/19/2018   Procedure: ROTATOR CUFF REPAIR SHOULDER OPEN;  Surgeon: Vickki Hearing, MD;  Location: [facility];  Service: Orthopedics;  Laterality: Right;  . SHOULDER OPEN ROTATOR CUFF REPAIR Right 08/05/2019   Procedure: ROTATOR CUFF REPAIR SHOULDER OPEN;  Surgeon: Vickki Hearing, MD;  Location: [facility];  Service: Orthopedics;  Laterality: Right;  . TUBAL LIGATION      There were no vitals filed for this visit.  Subjective Assessment - 09/26/19 1440    Subjective   S: it's still sore.    Currently in Pain?  Yes    Pain Score  4     Pain Location  Shoulder    Pain Orientation  Right    Pain Descriptors / Indicators  Sore    Pain Type  Acute pain    Pain Radiating Towards  N/A    Pain Onset  In the past 7 days    Pain Frequency  Intermittent    Aggravating Factors   movement and use    Pain Relieving Factors  pain medication and heat    Effect of Pain on Daily Activities  mod effect    Multiple Pain Sites  No         [facility] OT Assessment - 09/26/19 1043      Assessment   Medical Diagnosis  Right RTC repair      Precautions   Precautions  Shoulder    Type of  Shoulder Precautions  P/ROM for 4 weeks (09/02/19) Sling may be removed at 4 weeks. Progress to AA/ROM on 09/02/19. A/ROM on 09/30/19               OT Treatments/Exercises (OP) - 09/26/19 1043      Exercises   Exercises  Shoulder      Shoulder Exercises: Supine   Protraction  PROM;5 reps;AAROM;15 reps    Horizontal ABduction  PROM;5 reps;AAROM;15 reps    External Rotation  PROM;5 reps;AAROM;15 reps    Internal Rotation  PROM;5 reps;AAROM;15 reps    Flexion  PROM;5 reps;AAROM;15 reps    ABduction  PROM;5 reps;AAROM;15 reps      Shoulder Exercises: Standing   Protraction  AAROM;15 reps    Horizontal ABduction  AAROM;15 reps    External Rotation  AAROM;15 reps    Internal Rotation  AAROM;15 reps    Flexion  AAROM;15 reps    ABduction  AAROM;15 reps    Extension  Theraband;10 reps    Theraband Level (Shoulder Extension)  Level 2 (Red)    Row  Theraband;10 reps    Theraband Level (Shoulder Row)  Level 2 (Red)    Retraction  Theraband;10 reps    Theraband Level (Shoulder Retraction)  Level 2 (Red)       Shoulder Exercises: ROM/Strengthening   Wall Wash  1'    Proximal Shoulder Strengthening, Supine  10X A/ROM no rest breaks    Other ROM/Strengthening Exercises  PVC pipe slide; 10X             OT Education - 09/26/19 1103    Education Details  red scapular theraband exercises    Person(s) Educated  Patient    Methods  Explanation;Demonstration;Verbal cues;Handout    Comprehension  Verbalized understanding;Returned demonstration       OT Short Term Goals - 08/22/19 1037      OT SHORT TERM GOAL #1   Title  Patient will be educated and independent with HEP to faciliate progress in therapy and allow her to return to using her RUE as her dominant extremity for all daily and work activities.     Time  4    Period  Weeks    Status  On-going    Target Date  09/17/19      OT SHORT TERM GOAL #2   Title  Patient will increase RUE P/ROM to WNL in order to complete upper body dressing tasks with less difficulty.    Time  4    Period  Weeks    Status  On-going      OT SHORT TERM GOAL #3   Title  Pt will increase RUE strength to 3/5 in order to complete waist level tasks with less difficulty.    Time  4    Period  Weeks    Status  On-going      OT SHORT TERM GOAL #4   Title  Patient will report a decrease in pain level of 3/10 when completing daily tasks with her RUE.    Time  4    Period  Weeks    Status  On-going      OT SHORT TERM GOAL #5   Title  Patient will demonstrate fascial restrictions of trace amount in her RUE in order to increase functional mobility needed to complete reaching tasks.    Time  4    Period  Weeks    Status  On-going  OT Long Term Goals - 08/22/19 1037      OT LONG TERM GOAL #1   Title  Patient will return to highest level of independence while returning to work using her RUE for 75% or more of daily tasks.    Time  8    Period  Weeks    Status  On-going      OT LONG TERM GOAL #2   Title  Patient will increase A/ROM to WNL in order  to increase ability to reach above shoulder level and into cabinets with less difficulty.    Time  8    Period  Weeks    Status  On-going      OT LONG TERM GOAL #3   Title  Patient will increase RUE strength to 4+/5 in order to lift normal houshold and work related items.    Time  8    Period  Weeks    Status  On-going      OT LONG TERM GOAL #4   Title  Patient will report a decrease in pain of approximately 2/10 or less when using her RUE for daily tasks.    Time  8    Period  Weeks    Status  On-going            Plan - 09/26/19 1113    Clinical Impression Statement  A: no myofascial release completed this date. Increased AA/ROM to 15 repetitions this session. Provided red band scapular strengthening exercises to HEP. VC for form and technique were provided. Continues to voice pain/discomfort during exercises in shoulder region. Monitored pain during session.    Body Structure / Function / Physical Skills  ADL;UE functional use;Fascial restriction;Pain;ROM;Scar mobility;Decreased knowledge of use of DME;Strength    Plan  P:Continue with AA/ROM. Add therapy ball circles to work on internal rotation. Follow up on HEP.    Consulted and Agree with Plan of Care  Patient       Patient will benefit from skilled therapeutic intervention in order to improve the following deficits and impairments:   Body Structure / Function / Physical Skills: ADL, UE functional use, Fascial restriction, Pain, ROM, Scar mobility, Decreased knowledge of use of DME, Strength       Visit Diagnosis: Acute pain of right shoulder  Stiffness of right shoulder, not elsewhere classified  Other symptoms and signs involving the musculoskeletal system    Problem List Patient Active Problem List   Diagnosis Date Noted  . S/P right rotator cuff repair open 09/19/18 repaired again 08/05/19 10/04/2018  . Complete tear of right rotator cuff   . Chronic pain of left knee   . Left shoulder pain 09/21/2015  .  Disorders of bursae and tendons in shoulder region, unspecified 09/25/2013   Charnele Semple, OTR/L,CBIS  [phone]  09/26/2019, 2:46 PM  [facility] [address] [place], [place], [address] Phone: [phone]   Fax:  [phone]  Name: DARRELYN MORRO MRN: [id number] Date of Birth: Dec 17, 1975

## 2019-09-26 NOTE — Patient Instructions (Signed)
Complete once a day.   (Home) Extension: Isometric / Bilateral Arm Retraction - Sitting   Facing anchor, hold hands and elbow at shoulder height, with elbow bent.  Pull arms back to squeeze shoulder blades together. Repeat 10-15 times. 1-3 times/day.   (Clinic) Extension / Flexion (Assist)   Face anchor, pull arms back, keeping elbow straight, and squeze shoulder blades together. Repeat 10-15 times. 1-3 times/day.   Copyright  V[employer]. All rights reserved.   (Home) Retraction: Row - Bilateral (Anchor)   Facing anchor, arms reaching forward, pull hands toward stomach, keeping elbows bent and at your sides and pinching shoulder blades together. Repeat 10-15 times. 1-3 times/day.   Copyright  VHI. All rights reserved.

## 2019-09-28 ENCOUNTER — Other Ambulatory Visit: Payer: Self-pay

## 2019-09-28 DIAGNOSIS — Z4889 Encounter for other specified surgical aftercare: Secondary | ICD-10-CM

## 2019-09-29 MED ORDER — HYDROCODONE-ACETAMINOPHEN 7.5-325 MG PO TABS: 1.0000 | ORAL_TABLET | Freq: Four times a day (QID) | ORAL | 0 refills | Status: DC | PRN

## 2019-10-01 ENCOUNTER — Other Ambulatory Visit: Payer: Self-pay

## 2019-10-01 ENCOUNTER — Ambulatory Visit (HOSPITAL_COMMUNITY): Payer: BLUE CROSS/BLUE SHIELD

## 2019-10-01 ENCOUNTER — Encounter (HOSPITAL_COMMUNITY): Payer: Self-pay

## 2019-10-01 DIAGNOSIS — R29898 Other symptoms and signs involving the musculoskeletal system: Secondary | ICD-10-CM | POA: Diagnosis not present

## 2019-10-01 DIAGNOSIS — M25511 Pain in right shoulder: Secondary | ICD-10-CM

## 2019-10-01 DIAGNOSIS — M25611 Stiffness of right shoulder, not elsewhere classified: Secondary | ICD-10-CM

## 2019-10-01 NOTE — Therapy (Signed)
[facility] [address] [place], [place], [address] Phone: [phone]   Fax:  [phone]  Occupational Therapy Treatment  Patient Details  Name: Tina Pham MRN: [id number] Date of Birth: March 15, 1976 Referring Provider (OT): Fuller Canada, MD   Encounter Date: 10/01/2019  OT End of Session - 10/01/19 1419    Visit Number  10    Number of Visits  16    Date for OT Re-Evaluation  10/01/19    Authorization Type  [employer] other    Authorization Time Period  No authorization needed. PT/OT 30 visit limit. 28 remain 11/22/18-11/21/19    Authorization - Visit Number  12    Authorization - Number of Visits  28    OT Start Time  1350   Checked in at 1:50PM   OT Stop Time  1425    OT Time Calculation (min)  35 min    Activity Tolerance  Patient tolerated treatment well    Behavior During Therapy  WFL for tasks assessed/performed       Past Medical History:  Diagnosis Date  . Anxiety   . Arthritis   . Chronic back pain   . Chronic bronchitis (HCC)   . Depression   . Diabetes mellitus    lost weight and is not on meds now.  . High cholesterol   . History of kidney stones   . Hypertension   . Migraine   . Neuropathic pain of foot   . Plantar fasciitis   . Sciatica   . Torn rotator cuff     Past Surgical History:  Procedure Laterality Date  . BACK SURGERY     herniated disc; had disc removed and then fusion; total of 3 surgeries  . EYE SURGERY Bilateral    removal of cyst and straightening of muscles  . KNEE ARTHROSCOPY WITH MEDIAL MENISECTOMY Left 12/27/2016   Procedure: LEFT KNEE DIAGNOSTIC ARTHROSCOPY;  Surgeon: Vickki Hearing, MD;  Location: [facility];  Service: Orthopedics;  Laterality: Left;  . SHOULDER OPEN ROTATOR CUFF REPAIR Right 09/19/2018   Procedure: ROTATOR CUFF REPAIR SHOULDER OPEN;  Surgeon: Vickki Hearing, MD;  Location: [facility];  Service: Orthopedics;  Laterality: Right;  . SHOULDER OPEN ROTATOR CUFF  REPAIR Right 08/05/2019   Procedure: ROTATOR CUFF REPAIR SHOULDER OPEN;  Surgeon: Vickki Hearing, MD;  Location: [facility];  Service: Orthopedics;  Laterality: Right;  . TUBAL LIGATION      There were no vitals filed for this visit.  Subjective Assessment - 10/01/19 1353    Subjective   S: I got my first COVID vaccine in my left arm and it's hurting worse than the right.    Currently in Pain?  Yes    Pain Score  4     Pain Location  Shoulder    Pain Orientation  Right    Pain Descriptors / Indicators  Sore    Pain Type  Acute pain         [facility] OT Assessment - 10/01/19 1354      Assessment   Medical Diagnosis  Right RTC repair      Precautions   Precautions  Shoulder    Type of Shoulder Precautions  P/ROM for 4 weeks (09/02/19) Sling may be removed at 4 weeks. Progress to AA/ROM on 09/02/19. A/ROM on 09/30/19               OT Treatments/Exercises (OP) - 10/01/19 1354  Exercises   Exercises  Shoulder      Shoulder Exercises: Supine   Protraction  PROM;5 reps;AROM;10 reps    External Rotation  PROM;5 reps;AROM;10 reps    Internal Rotation  PROM;5 reps;AROM      Shoulder Exercises: Standing   Protraction  AROM;10 reps    Horizontal ABduction  AROM;10 reps    External Rotation  AROM;10 reps    Internal Rotation  AROM;10 reps    Flexion  AROM;10 reps    ABduction  AROM;10 reps    Extension  Theraband;10 reps    Theraband Level (Shoulder Extension)  Level 2 (Red)    Row  Theraband;10 reps    Theraband Level (Shoulder Row)  Level 2 (Red)    Retraction  Theraband;10 reps    Theraband Level (Shoulder Retraction)  Level 2 (Red)      Shoulder Exercises: Therapy Ball   Right/Left  Other (comment)   2X right/left     Shoulder Exercises: ROM/Strengthening   Proximal Shoulder Strengthening, Seated  10X A/ROM no rest breaks    Other ROM/Strengthening Exercises  PVC pipe slide; 12X    Other ROM/Strengthening Exercises  proximal shoulder strengthening using washcloth  at wall; 1' flexion             OT Education - 10/01/19 1422    Education Details  A/ROM shoulder exercises    Person(s) Educated  Patient    Methods  Explanation;Demonstration;Verbal cues;Handout    Comprehension  Verbalized understanding;Returned demonstration       OT Short Term Goals - 08/22/19 1037      OT SHORT TERM GOAL #1   Title  Patient will be educated and independent with HEP to faciliate progress in therapy and allow her to return to using her RUE as her dominant extremity for all daily and work activities.     Time  4    Period  Weeks    Status  On-going    Target Date  09/17/19      OT SHORT TERM GOAL #2   Title  Patient will increase RUE P/ROM to WNL in order to complete upper body dressing tasks with less difficulty.    Time  4    Period  Weeks    Status  On-going      OT SHORT TERM GOAL #3   Title  Pt will increase RUE strength to 3/5 in order to complete waist level tasks with less difficulty.    Time  4    Period  Weeks    Status  On-going      OT SHORT TERM GOAL #4   Title  Patient will report a decrease in pain level of 3/10 when completing daily tasks with her RUE.    Time  4    Period  Weeks    Status  On-going      OT SHORT TERM GOAL #5   Title  Patient will demonstrate fascial restrictions of trace amount in her RUE in order to increase functional mobility needed to complete reaching tasks.    Time  4    Period  Weeks    Status  On-going        OT Long Term Goals - 08/22/19 1037      OT LONG TERM GOAL #1   Title  Patient will return to highest level of independence while returning to work using her RUE for 75% or more of daily tasks.    Time  8    Period  Weeks    Status  On-going      OT LONG TERM GOAL #2   Title  Patient will increase A/ROM to WNL in order to increase ability to reach above shoulder level and into cabinets with less difficulty.    Time  8    Period  Weeks    Status  On-going      OT LONG TERM GOAL #3    Title  Patient will increase RUE strength to 4+/5 in order to lift normal houshold and work related items.    Time  8    Period  Weeks    Status  On-going      OT LONG TERM GOAL #4   Title  Patient will report a decrease in pain of approximately 2/10 or less when using her RUE for daily tasks.    Time  8    Period  Weeks    Status  On-going            Plan - 10/01/19 1446    Clinical Impression Statement  A: Pt received 1st COVID vaccine in LUE and currently experiencing increased soreness in arm up to cervical region. Pt was able to progress to A/ROM supine and standing with difficulty due to pain/soreness in both UEs. VC for form and technique were provided. Updated HEP for A/ROM.    Body Structure / Function / Physical Skills  ADL;UE functional use;Fascial restriction;Pain;ROM;Scar mobility;Decreased knowledge of use of DME;Strength    Plan  P: Follow up on HEP. Continue with A/ROM.    Consulted and Agree with Plan of Care  Patient       Patient will benefit from skilled therapeutic intervention in order to improve the following deficits and impairments:   Body Structure / Function / Physical Skills: ADL, UE functional use, Fascial restriction, Pain, ROM, Scar mobility, Decreased knowledge of use of DME, Strength       Visit Diagnosis: Acute pain of right shoulder  Stiffness of right shoulder, not elsewhere classified  Other symptoms and signs involving the musculoskeletal system    Problem List Patient Active Problem List   Diagnosis Date Noted  . S/P right rotator cuff repair open 09/19/18 repaired again 08/05/19 10/04/2018  . Complete tear of right rotator cuff   . Chronic pain of left knee   . Left shoulder pain 09/21/2015  . Disorders of bursae and tendons in shoulder region, unspecified 09/25/2013   Shine Scrogham, OTR/L,CBIS  [phone]  10/01/2019, 3:00 PM  [facility] [address] [place], [place],  [address] Phone: [phone]   Fax:  [phone]  Name: LAWSON ISABELL MRN: [id number] Date of Birth: 02-11-1976

## 2019-10-01 NOTE — Patient Instructions (Signed)
Repeat all exercises 10-15 times, 1-2 times per day.  1) Shoulder Protraction    Begin with elbows by your side, slowly "punch" straight out in front of you.      2) Shoulder Flexion  Standing:         Begin with arms at your side with thumbs pointed up, slowly raise both arms up and forward towards overhead.      3) Horizontal abduction/adduction  Supine:   Standing:           Begin with arms straight out in front of you, bring out to the side in at "T" shape. Keep arms straight entire time.       4) Internal & External Rotation    *No band* -Stand with elbows at the side and elbows bent 90 degrees. Move your forearms away from your body, then bring back inward toward the body.     5) Shoulder Abduction  Standing:       Begin with your arms  next to your side. Slowly move your arms out to the side so that they go overhead, in a jumping jack or snow angel movement.

## 2019-10-03 ENCOUNTER — Other Ambulatory Visit: Payer: Self-pay

## 2019-10-03 ENCOUNTER — Encounter (HOSPITAL_COMMUNITY): Payer: Self-pay

## 2019-10-03 ENCOUNTER — Ambulatory Visit (HOSPITAL_COMMUNITY): Payer: BLUE CROSS/BLUE SHIELD

## 2019-10-03 DIAGNOSIS — R29898 Other symptoms and signs involving the musculoskeletal system: Secondary | ICD-10-CM | POA: Diagnosis not present

## 2019-10-03 DIAGNOSIS — Z4889 Encounter for other specified surgical aftercare: Secondary | ICD-10-CM

## 2019-10-03 DIAGNOSIS — M25611 Stiffness of right shoulder, not elsewhere classified: Secondary | ICD-10-CM

## 2019-10-03 DIAGNOSIS — M25511 Pain in right shoulder: Secondary | ICD-10-CM

## 2019-10-03 NOTE — Therapy (Signed)
[place], [place], [address] Phone: [phone]   Fax:  [phone]  Occupational Therapy Treatment Reassessment/re-cert Patient Details  Name: Tina Pham MRN: [id number] Date of Birth: 1975-12-14 Referring Provider (OT): Arther Abbott, MD   Encounter Date: 10/03/2019  OT End of Session - 10/03/19 1101    Visit Number  11    Number of Visits  19    Date for OT Re-Evaluation  10/31/19    Authorization Type  [employer] other    Authorization Time Period  No authorization needed. PT/OT 30 visit limit. 28 remain 11/22/18-11/21/19    Authorization - Visit Number  13    Authorization - Number of Visits  28    OT Start Time  9357   reassessment   OT Stop Time  1111    OT Time Calculation (min)  38 min    Activity Tolerance  Patient tolerated treatment well    Behavior During Therapy  WFL for tasks assessed/performed       Past Medical History:  Diagnosis Date  . Anxiety   . Arthritis   . Chronic back pain   . Chronic bronchitis ([place])   . Depression   . Diabetes mellitus    lost weight and is not on meds now.  . High cholesterol   . History of kidney stones   . Hypertension   . Migraine   . Neuropathic pain of foot   . Plantar fasciitis   . Sciatica   . Torn rotator cuff     Past Surgical History:  Procedure Laterality Date  . BACK SURGERY     herniated disc; had disc removed and then fusion; total of 3 surgeries  . EYE SURGERY Bilateral    removal of cyst and straightening of muscles  . KNEE ARTHROSCOPY WITH MEDIAL MENISECTOMY Left 12/27/2016   Procedure: LEFT KNEE DIAGNOSTIC ARTHROSCOPY;  Surgeon: Carole Civil, MD;  Location: [facility];  Service: Orthopedics;  Laterality: Left;  . SHOULDER OPEN ROTATOR CUFF REPAIR Right 09/19/2018   Procedure: ROTATOR CUFF REPAIR SHOULDER OPEN;  Surgeon: Carole Civil, MD;  Location: [facility];  Service: Orthopedics;  Laterality: Right;  . SHOULDER OPEN  ROTATOR CUFF REPAIR Right 08/05/2019   Procedure: ROTATOR CUFF REPAIR SHOULDER OPEN;  Surgeon: Carole Civil, MD;  Location: [facility];  Service: Orthopedics;  Laterality: Right;  . TUBAL LIGATION      There were no vitals filed for this visit.  Subjective Assessment - 10/03/19 1041    Subjective   S: I didn't get a chance to do all the exercises because of how sore my left arm was with the vaccine shot but I did try.    Currently in Pain?  Yes    Pain Score  4     Pain Location  Shoulder    Pain Orientation  Right    Pain Descriptors / Indicators  Sore    Pain Type  Acute pain    Pain Radiating Towards  n/A    Pain Onset  1 to 4 weeks ago    Pain Frequency  Intermittent    Aggravating Factors   movement and use    Pain Relieving Factors  pain medication and heat    Effect of Pain on Daily Activities  mod effect    Multiple Pain Sites  No         [facility] OT Assessment - 10/03/19 1044  Assessment   Medical Diagnosis  Right RTC repair    Referring Provider (OT)  Arther Abbott, MD    Onset Date/Surgical Date  08/05/19      Precautions   Precautions  Shoulder    Type of Shoulder Precautions  P/ROM for 4 weeks (09/02/19) Sling may be removed at 4 weeks. Progress to AA/ROM on 09/02/19. A/ROM on 09/30/19      Prior Function   Level of Independence  Independent      Observation/Other Assessments   Focus on Therapeutic Outcomes (FOTO)   55/100      AROM   Overall AROM Comments  Assessed standing. A/ROM not assessed prior to this session. IR/er adducted    AROM Assessment Site  Shoulder    Right/Left Shoulder  Right    Right Shoulder Flexion  160 Degrees   previous: 120   Right Shoulder ABduction  142 Degrees   previous: 91   Right Shoulder Internal Rotation  90 Degrees   previous: same   Right Shoulder External Rotation  90 Degrees   previous: same     PROM   Overall PROM Comments  Assessed supine. IR/er adducted    PROM Assessment Site  Shoulder    Right/Left  Shoulder  Right    Right Shoulder Flexion  180 Degrees   previous: 170   Right Shoulder ABduction  180 Degrees   previous: 170   Right Shoulder Internal Rotation  90 Degrees    Right Shoulder External Rotation  90 Degrees      Strength   Overall Strength Comments  Assessed standing. IR/er adducted.     Strength Assessment Site  Shoulder    Right/Left Shoulder  Right    Right Shoulder Flexion  3+/5   previous: 3/5   Right Shoulder ABduction  3+/5   previous: 3-/5   Right Shoulder Internal Rotation  3+/5   previous: 3/5   Right Shoulder External Rotation  4-/5   previous: 3/5              OT Treatments/Exercises (OP) - 10/03/19 1059      Exercises   Exercises  Shoulder      Shoulder Exercises: Supine   Protraction  PROM;5 reps;AROM;10 reps    Horizontal ABduction  PROM;5 reps;AROM;10 reps    External Rotation  PROM;5 reps;AROM;10 reps    Internal Rotation  PROM;5 reps;AROM;10 reps    Flexion  PROM;5 reps;AROM;10 reps    ABduction  PROM;5 reps;AROM;10 reps      Shoulder Exercises: Standing   Protraction  AROM;10 reps    Horizontal ABduction  AROM;10 reps    External Rotation  AROM;10 reps    Internal Rotation  AROM;10 reps    Flexion  AROM;10 reps    ABduction  AROM;10 reps      Shoulder Exercises: ROM/Strengthening   Over Head Lace  seated 2'    Proximal Shoulder Strengthening, Seated  12X A/ROM no rest breaks    Other ROM/Strengthening Exercises  proximal shoulder strengthening using washcloth at wall; 1' flexion               OT Short Term Goals - 10/03/19 1055      OT SHORT TERM GOAL #1   Title  Patient will be educated and independent with HEP to faciliate progress in therapy and allow her to return to using her RUE as her dominant extremity for all daily and work activities.     Time  4  Period  Weeks    Status  Achieved    Target Date  09/17/19      OT SHORT TERM GOAL #2   Title  Patient will increase RUE P/ROM to WNL in order to  complete upper body dressing tasks with less difficulty.    Time  4    Period  Weeks    Status  Achieved      OT SHORT TERM GOAL #3   Title  Pt will increase RUE strength to 3/5 in order to complete waist level tasks with less difficulty.    Time  4    Period  Weeks    Status  Achieved      OT SHORT TERM GOAL #4   Title  Patient will report a decrease in pain level of 3/10 when completing daily tasks with her RUE.    Time  4    Period  Weeks    Status  Partially Met      OT SHORT TERM GOAL #5   Title  Patient will demonstrate fascial restrictions of trace amount in her RUE in order to increase functional mobility needed to complete reaching tasks.    Time  4    Period  Weeks    Status  Achieved        OT Long Term Goals - 10/03/19 1056      OT LONG TERM GOAL #1   Title  Patient will return to highest level of independence while returning to work using her RUE for 75% or more of daily tasks.    Time  8    Period  Weeks    Status  On-going      OT LONG TERM GOAL #2   Title  Patient will increase A/ROM to WNL in order to increase ability to reach above shoulder level and into cabinets with less difficulty.    Time  8    Period  Weeks    Status  On-going      OT LONG TERM GOAL #3   Title  Patient will increase RUE strength to 4+/5 in order to lift normal houshold and work related items.    Time  8    Period  Weeks    Status  On-going      OT LONG TERM GOAL #4   Title  Patient will report a decrease in pain of approximately 2/10 or less when using her RUE for daily tasks.    Time  8    Period  Weeks    Status  On-going            Plan - 10/03/19 1101    Clinical Impression Statement  A: Reassessment/re-cert completed this date. Patient has just started A/ROM shoulder exercises this week. She continues to have soreness in her RUE at 4/10. She demonstrates full P/ROM and functional A/ROM. Continues to have deficits with strength, pain, and ROM and would benefit  from continuing OT services 2X a week for 4 weeks to focus on mentioned deficits.    Body Structure / Function / Physical Skills  ADL;UE functional use;Fascial restriction;Pain;ROM;Scar mobility;Decreased knowledge of use of DME;Strength    Plan  P:Work on slowly increasing shoulder endurance. Continue with A/ROM movements/activities.    Consulted and Agree with Plan of Care  Patient       Patient will benefit from skilled therapeutic intervention in order to improve the following deficits and impairments:   Body Structure / Function /  Physical Skills: ADL, UE functional use, Fascial restriction, Pain, ROM, Scar mobility, Decreased knowledge of use of DME, Strength       Visit Diagnosis: Acute pain of right shoulder - Plan: Ot plan of care cert/re-cert  Stiffness of right shoulder, not elsewhere classified - Plan: Ot plan of care cert/re-cert  Other symptoms and signs involving the musculoskeletal system - Plan: Ot plan of care cert/re-cert    Problem List Patient Active Problem List   Diagnosis Date Noted  . S/P right rotator cuff repair open 09/19/18 repaired again 08/05/19 10/04/2018  . Complete tear of right rotator cuff   . Chronic pain of left knee   . Left shoulder pain 09/21/2015  . Disorders of bursae and tendons in shoulder region, unspecified 09/25/2013   Ahnesty Finfrock, OTR/L,CBIS  [phone]  10/03/2019, 11:14 AM  [place], [place], [address] Phone: [phone]   Fax:  [phone]  Name: KAMEE BOBST MRN: [id number] Date of Birth: 06/11/1976

## 2019-10-06 ENCOUNTER — Other Ambulatory Visit: Payer: Self-pay

## 2019-10-06 DIAGNOSIS — Z4889 Encounter for other specified surgical aftercare: Secondary | ICD-10-CM

## 2019-10-06 MED ORDER — HYDROCODONE-ACETAMINOPHEN 7.5-325 MG PO TABS: 1.0000 | ORAL_TABLET | Freq: Four times a day (QID) | ORAL | 0 refills | Status: DC | PRN

## 2019-10-08 ENCOUNTER — Other Ambulatory Visit: Payer: Self-pay

## 2019-10-08 ENCOUNTER — Encounter (HOSPITAL_COMMUNITY): Payer: Self-pay

## 2019-10-08 ENCOUNTER — Ambulatory Visit (HOSPITAL_COMMUNITY): Payer: BLUE CROSS/BLUE SHIELD

## 2019-10-08 DIAGNOSIS — R29898 Other symptoms and signs involving the musculoskeletal system: Secondary | ICD-10-CM

## 2019-10-08 DIAGNOSIS — M25611 Stiffness of right shoulder, not elsewhere classified: Secondary | ICD-10-CM

## 2019-10-08 DIAGNOSIS — M25511 Pain in right shoulder: Secondary | ICD-10-CM

## 2019-10-08 NOTE — Therapy (Signed)
[place], [place], [address] Phone: [phone]   Fax:  [phone]  Occupational Therapy Treatment  Patient Details  Name: Tina Pham MRN: [id number] Date of Birth: 02-Apr-1976 Referring Provider (OT): Arther Abbott, MD   Encounter Date: 10/08/2019  OT End of Session - 10/08/19 1319    Visit Number  12    Number of Visits  19    Date for OT Re-Evaluation  10/31/19    Authorization Type  [employer] other    Authorization Time Period  No authorization needed. PT/OT 30 visit limit. 28 remain 11/22/18-11/21/19    Authorization - Visit Number  14    Authorization - Number of Visits  28    OT Start Time  7124    OT Stop Time  1338    OT Time Calculation (min)  35 min    Activity Tolerance  Patient tolerated treatment well    Behavior During Therapy  WFL for tasks assessed/performed       Past Medical History:  Diagnosis Date  . Anxiety   . Arthritis   . Chronic back pain   . Chronic bronchitis ([place])   . Depression   . Diabetes mellitus    lost weight and is not on meds now.  . High cholesterol   . History of kidney stones   . Hypertension   . Migraine   . Neuropathic pain of foot   . Plantar fasciitis   . Sciatica   . Torn rotator cuff     Past Surgical History:  Procedure Laterality Date  . BACK SURGERY     herniated disc; had disc removed and then fusion; total of 3 surgeries  . EYE SURGERY Bilateral    removal of cyst and straightening of muscles  . KNEE ARTHROSCOPY WITH MEDIAL MENISECTOMY Left 12/27/2016   Procedure: LEFT KNEE DIAGNOSTIC ARTHROSCOPY;  Surgeon: Carole Civil, MD;  Location: [facility];  Service: Orthopedics;  Laterality: Left;  . SHOULDER OPEN ROTATOR CUFF REPAIR Right 09/19/2018   Procedure: ROTATOR CUFF REPAIR SHOULDER OPEN;  Surgeon: Carole Civil, MD;  Location: [facility];  Service: Orthopedics;  Laterality: Right;  . SHOULDER OPEN ROTATOR CUFF REPAIR Right 08/05/2019   Procedure: ROTATOR CUFF REPAIR SHOULDER OPEN;  Surgeon: Carole Civil, MD;  Location: [facility];  Service: Orthopedics;  Laterality: Right;  . TUBAL LIGATION      There were no vitals filed for this visit.  Subjective Assessment - 10/08/19 1313    Subjective   S: Will it ever not hurt?    Currently in Pain?  Yes    Pain Score  5     Pain Location  Shoulder    Pain Orientation  Right    Pain Descriptors / Indicators  Sore    Pain Type  Acute pain         [facility] OT Assessment - 10/08/19 1313      Assessment   Medical Diagnosis  Right RTC repair      Precautions   Precautions  Shoulder    Type of Shoulder Precautions  P/ROM for 4 weeks (09/02/19) Sling may be removed at 4 weeks. Progress to AA/ROM on 09/02/19. A/ROM on 09/30/19               OT Treatments/Exercises (OP) - 10/08/19 1314      Exercises   Exercises  Shoulder      Shoulder Exercises: Supine   Protraction  PROM;5 reps;AROM;12 reps    Horizontal ABduction  PROM;5 reps;AROM;12 reps    External Rotation  PROM;5 reps;AROM;12 reps    Internal Rotation  PROM;5 reps;AROM;12 reps    Flexion  PROM;5 reps;AROM;12 reps    ABduction  PROM;5 reps;AROM;12 reps      Shoulder Exercises: Standing   Protraction  AROM;12 reps    Horizontal ABduction  AROM;12 reps    External Rotation  AROM;12 reps    Internal Rotation  AROM;12 reps    Flexion  AROM;12 reps    ABduction  AROM;12 reps    Extension  Theraband;12 reps    Theraband Level (Shoulder Extension)  Level 2 (Red)    Row  Theraband;12 reps    Theraband Level (Shoulder Row)  Level 2 (Red)    Retraction  Theraband;12 reps    Theraband Level (Shoulder Retraction)  Level 2 (Red)      Shoulder Exercises: Therapy Ball   Right/Left  5 reps      Shoulder Exercises: ROM/Strengthening   Over Head Lace  seated 2'    X to V Arms  10X A/ROM    Proximal Shoulder Strengthening, Seated  12X A/ROM no rest breaks    Other ROM/Strengthening Exercises  proximal shoulder  strengthening using washcloth at wall; 1' flexion 1' abduction               OT Short Term Goals - 10/08/19 1347      OT SHORT TERM GOAL #1   Title  Patient will be educated and independent with HEP to faciliate progress in therapy and allow her to return to using her RUE as her dominant extremity for all daily and work activities.     Time  4    Period  Weeks    Target Date  09/17/19      OT SHORT TERM GOAL #2   Title  Patient will increase RUE P/ROM to WNL in order to complete upper body dressing tasks with less difficulty.    Time  4    Period  Weeks      OT SHORT TERM GOAL #3   Title  Pt will increase RUE strength to 3/5 in order to complete waist level tasks with less difficulty.    Time  4    Period  Weeks      OT SHORT TERM GOAL #4   Title  Patient will report a decrease in pain level of 3/10 when completing daily tasks with her RUE.    Time  4    Period  Weeks    Status  Partially Met      OT SHORT TERM GOAL #5   Title  Patient will demonstrate fascial restrictions of trace amount in her RUE in order to increase functional mobility needed to complete reaching tasks.    Time  4    Period  Weeks        OT Long Term Goals - 10/03/19 1056      OT LONG TERM GOAL #1   Title  Patient will return to highest level of independence while returning to work using her RUE for 75% or more of daily tasks.    Time  8    Period  Weeks    Status  On-going      OT LONG TERM GOAL #2   Title  Patient will increase A/ROM to WNL in order to increase ability to reach above shoulder level and into cabinets with less  difficulty.    Time  8    Period  Weeks    Status  On-going      OT LONG TERM GOAL #3   Title  Patient will increase RUE strength to 4+/5 in order to lift normal houshold and work related items.    Time  8    Period  Weeks    Status  On-going      OT LONG TERM GOAL #4   Title  Patient will report a decrease in pain of approximately 2/10 or less when using  her RUE for daily tasks.    Time  8    Period  Weeks    Status  On-going            Plan - 10/08/19 1344    Clinical Impression Statement  A: Continued with A/ROM exercises while progressing to 12 repetitions. Patient was able to complete X to V arms and 5 rotations during therapy ball circles demonstrating increased shoulder endurance and tolerance for exericses. VC for form and technique provided during session. Pt reports muscle fatigue at end of session.    Body Structure / Function / Physical Skills  ADL;UE functional use;Fascial restriction;Pain;ROM;Scar mobility;Decreased knowledge of use of DME;Strength    Plan  P: Add UBE bike.    Consulted and Agree with Plan of Care  Patient       Patient will benefit from skilled therapeutic intervention in order to improve the following deficits and impairments:   Body Structure / Function / Physical Skills: ADL, UE functional use, Fascial restriction, Pain, ROM, Scar mobility, Decreased knowledge of use of DME, Strength       Visit Diagnosis: Stiffness of right shoulder, not elsewhere classified  Other symptoms and signs involving the musculoskeletal system  Acute pain of right shoulder    Problem List Patient Active Problem List   Diagnosis Date Noted  . S/P right rotator cuff repair open 09/19/18 repaired again 08/05/19 10/04/2018  . Complete tear of right rotator cuff   . Chronic pain of left knee   . Left shoulder pain 09/21/2015  . Disorders of bursae and tendons in shoulder region, unspecified 09/25/2013   Gaytha Raybourn, OTR/L,CBIS  [phone]  10/08/2019, 1:47 PM  [facility] [address] [place], [place], [address] Phone: [phone]   Fax:  [phone]  Name: Tina Pham MRN: [id number] Date of Birth: 1975/08/06

## 2019-10-10 ENCOUNTER — Encounter (HOSPITAL_COMMUNITY): Payer: Self-pay

## 2019-10-10 ENCOUNTER — Ambulatory Visit (HOSPITAL_COMMUNITY): Payer: BLUE CROSS/BLUE SHIELD

## 2019-10-10 ENCOUNTER — Other Ambulatory Visit: Payer: Self-pay

## 2019-10-10 DIAGNOSIS — M25611 Stiffness of right shoulder, not elsewhere classified: Secondary | ICD-10-CM

## 2019-10-10 DIAGNOSIS — M25511 Pain in right shoulder: Secondary | ICD-10-CM

## 2019-10-10 DIAGNOSIS — R29898 Other symptoms and signs involving the musculoskeletal system: Secondary | ICD-10-CM

## 2019-10-10 NOTE — Therapy (Signed)
[place], [place], [address] Phone: [phone]   Fax:  [phone]  Occupational Therapy Treatment  Patient Details  Name: PATIENCE NUZZO MRN: [id number] Date of Birth: [birth year] Referring Provider (OT): Arther Abbott, MD   Encounter Date: 10/10/2019  OT End of Session - 10/10/19 1112    Visit Number  13    Number of Visits  19    Date for OT Re-Evaluation  10/31/19    Authorization Type  [employer] other    Authorization Time Period  No authorization needed. PT/OT 30 visit limit. 28 remain 11/22/18-11/21/19    Authorization - Visit Number  15    Authorization - Number of Visits  28    OT Start Time  1030    OT Stop Time  1110    OT Time Calculation (min)  40 min    Activity Tolerance  Patient tolerated treatment well    Behavior During Therapy  WFL for tasks assessed/performed       Past Medical History:  Diagnosis Date  . Anxiety   . Arthritis   . Chronic back pain   . Chronic bronchitis ([place])   . Depression   . Diabetes mellitus    lost weight and is not on meds now.  . High cholesterol   . History of kidney stones   . Hypertension   . Migraine   . Neuropathic pain of foot   . Plantar fasciitis   . Sciatica   . Torn rotator cuff     Past Surgical History:  Procedure Laterality Date  . BACK SURGERY     herniated disc; had disc removed and then fusion; total of 3 surgeries  . EYE SURGERY Bilateral    removal of cyst and straightening of muscles  . KNEE ARTHROSCOPY WITH MEDIAL MENISECTOMY Left 12/27/2016   Procedure: LEFT KNEE DIAGNOSTIC ARTHROSCOPY;  Surgeon: Carole Civil, MD;  Location: [facility];  Service: Orthopedics;  Laterality: Left;  . SHOULDER OPEN ROTATOR CUFF REPAIR Right 09/19/2018   Procedure: ROTATOR CUFF REPAIR SHOULDER OPEN;  Surgeon: Carole Civil, MD;  Location: [facility];  Service: Orthopedics;  Laterality: Right;  . SHOULDER OPEN ROTATOR CUFF REPAIR Right 08/05/2019   Procedure: ROTATOR CUFF REPAIR SHOULDER OPEN;  Surgeon: Carole Civil, MD;  Location: [facility];  Service: Orthopedics;  Laterality: Right;  . TUBAL LIGATION      There were no vitals filed for this visit.  Subjective Assessment - 10/10/19 1039    Subjective   S: It actually doesn't feel as bad as it usually does right now.    Currently in Pain?  Yes    Pain Score  3     Pain Location  Shoulder    Pain Orientation  Right    Pain Descriptors / Indicators  Sore    Pain Type  Acute pain    Pain Radiating Towards  N/A    Pain Onset  1 to 4 weeks ago    Pain Frequency  Intermittent    Aggravating Factors   movement and use    Pain Relieving Factors  pain medications and heat    Effect of Pain on Daily Activities  min effect    Multiple Pain Sites  No         [facility] OT Assessment - 10/10/19 1041      Assessment   Medical Diagnosis  Right RTC repair      Precautions  Precautions  Shoulder    Type of Shoulder Precautions  P/ROM for 4 weeks (09/02/19) Sling may be removed at 4 weeks. Progress to AA/ROM on 09/02/19. A/ROM on 09/30/19               OT Treatments/Exercises (OP) - 10/10/19 1041      Exercises   Exercises  Shoulder      Shoulder Exercises: Supine   Protraction  PROM;5 reps;AROM;15 reps    Horizontal ABduction  PROM;5 reps;AROM;15 reps    External Rotation  PROM;5 reps;AROM;15 reps    Internal Rotation  PROM;5 reps;AROM;15 reps    Flexion  PROM;5 reps;AROM;15 reps    ABduction  PROM;5 reps;AROM;15 reps      Shoulder Exercises: Standing   Protraction  AROM;15 reps    Horizontal ABduction  AROM;15 reps    External Rotation  AROM;15 reps    Internal Rotation  AROM;15 reps    Flexion  AROM;15 reps    ABduction  AROM;15 reps      Shoulder Exercises: ROM/Strengthening   UBE (Upper Arm Bike)  Level 2 2' forward 2' reverse   pace: 6.0-7.0   Over Head Lace  seated 2'    X to V Arms  10X A/ROM    Proximal Shoulder Strengthening, Seated  15X A/ROM no rest  breaks    Other ROM/Strengthening Exercises  proximal shoulder strengthening using washcloth at wall; 1' flexion 1' abduction               OT Short Term Goals - 10/08/19 1347      OT SHORT TERM GOAL #1   Title  Patient will be educated and independent with HEP to faciliate progress in therapy and allow her to return to using her RUE as her dominant extremity for all daily and work activities.     Time  4    Period  Weeks    Target Date  09/17/19      OT SHORT TERM GOAL #2   Title  Patient will increase RUE P/ROM to WNL in order to complete upper body dressing tasks with less difficulty.    Time  4    Period  Weeks      OT SHORT TERM GOAL #3   Title  Pt will increase RUE strength to 3/5 in order to complete waist level tasks with less difficulty.    Time  4    Period  Weeks      OT SHORT TERM GOAL #4   Title  Patient will report a decrease in pain level of 3/10 when completing daily tasks with her RUE.    Time  4    Period  Weeks    Status  Partially Met      OT SHORT TERM GOAL #5   Title  Patient will demonstrate fascial restrictions of trace amount in her RUE in order to increase functional mobility needed to complete reaching tasks.    Time  4    Period  Weeks        OT Long Term Goals - 10/03/19 1056      OT LONG TERM GOAL #1   Title  Patient will return to highest level of independence while returning to work using her RUE for 75% or more of daily tasks.    Time  8    Period  Weeks    Status  On-going      OT LONG TERM GOAL #2   Title  Patient will increase A/ROM to WNL in order to increase ability to reach above shoulder level and into cabinets with less difficulty.    Time  8    Period  Weeks    Status  On-going      OT LONG TERM GOAL #3   Title  Patient will increase RUE strength to 4+/5 in order to lift normal houshold and work related items.    Time  8    Period  Weeks    Status  On-going      OT LONG TERM GOAL #4   Title  Patient will  report a decrease in pain of approximately 2/10 or less when using her RUE for daily tasks.    Time  8    Period  Weeks    Status  On-going            Plan - 10/10/19 1113    Clinical Impression Statement  A: Added UBE bike to work on shoulder stability and strength. Pt able to complete 15 repetitions for supine and standing A/ROM. Full passive ROM noted this session. Decreased pain level expressed at beginning of session. VC for form and technique.    Body Structure / Function / Physical Skills  ADL;UE functional use;Fascial restriction;Pain;ROM;Scar mobility;Decreased knowledge of use of DME;Strength    Plan  P: Take measurements for MD appointment.    Consulted and Agree with Plan of Care  Patient       Patient will benefit from skilled therapeutic intervention in order to improve the following deficits and impairments:   Body Structure / Function / Physical Skills: ADL, UE functional use, Fascial restriction, Pain, ROM, Scar mobility, Decreased knowledge of use of DME, Strength       Visit Diagnosis: Stiffness of right shoulder, not elsewhere classified  Other symptoms and signs involving the musculoskeletal system  Acute pain of right shoulder    Problem List Patient Active Problem List   Diagnosis Date Noted  . S/P right rotator cuff repair open 09/19/18 repaired again 08/05/19 10/04/2018  . Complete tear of right rotator cuff   . Chronic pain of left knee   . Left shoulder pain 09/21/2015  . Disorders of bursae and tendons in shoulder region, unspecified 09/25/2013   Jaline Pincock, OTR/L,CBIS  [phone]  10/10/2019, 11:15 AM  [facility] [address] [place], [place], [address] Phone: [phone]   Fax:  [phone]  Name: CYRENE GHARIBIAN MRN: [id number] Date of Birth: 05-20-1976

## 2019-10-12 ENCOUNTER — Other Ambulatory Visit: Payer: Self-pay

## 2019-10-12 DIAGNOSIS — Z4889 Encounter for other specified surgical aftercare: Secondary | ICD-10-CM

## 2019-10-13 MED ORDER — HYDROCODONE-ACETAMINOPHEN 7.5-325 MG PO TABS: 1.0000 | ORAL_TABLET | Freq: Four times a day (QID) | ORAL | 0 refills | Status: AC | PRN

## 2019-10-15 ENCOUNTER — Other Ambulatory Visit: Payer: Self-pay

## 2019-10-15 ENCOUNTER — Ambulatory Visit (HOSPITAL_COMMUNITY): Payer: BLUE CROSS/BLUE SHIELD

## 2019-10-15 ENCOUNTER — Encounter (HOSPITAL_COMMUNITY): Payer: Self-pay

## 2019-10-15 ENCOUNTER — Encounter: Payer: Self-pay | Admitting: Orthopedic Surgery

## 2019-10-15 ENCOUNTER — Ambulatory Visit (INDEPENDENT_AMBULATORY_CARE_PROVIDER_SITE_OTHER): Payer: BLUE CROSS/BLUE SHIELD | Admitting: Orthopedic Surgery

## 2019-10-15 DIAGNOSIS — M25511 Pain in right shoulder: Secondary | ICD-10-CM

## 2019-10-15 DIAGNOSIS — R29898 Other symptoms and signs involving the musculoskeletal system: Secondary | ICD-10-CM

## 2019-10-15 DIAGNOSIS — M25611 Stiffness of right shoulder, not elsewhere classified: Secondary | ICD-10-CM

## 2019-10-15 DIAGNOSIS — Z9889 Other specified postprocedural states: Secondary | ICD-10-CM

## 2019-10-15 MED ORDER — TIZANIDINE HCL 4 MG PO TABS: 4.0000 mg | ORAL_TABLET | Freq: Three times a day (TID) | ORAL | 1 refills | Status: DC

## 2019-10-15 NOTE — Patient Instructions (Signed)
OUT OF WORK 4 WEEKS 

## 2019-10-15 NOTE — Progress Notes (Signed)
Chief Complaint  Patient presents with  . Post-op Follow-up    08/05/19 right rotator cuff repair   . Medication Refill    tizanidine   2 1/2 mos post op  Doing well.  Her strength is 4 out of 5 flexion abduction and internal rotation 5 out of 5 external rotation she has active range of motion of 163 degrees flexion 170 abduction  Pain is well controlled    Encounter Diagnosis  Name Primary?  . S/P right rotator cuff repair open 09/19/18 repaired again 08/05/19 Yes    Meds ordered this encounter  Medications  . tiZANidine (ZANAFLEX) 4 MG tablet    Sig: Take 1 tablet (4 mg total) by mouth 3 (three) times daily.    Dispense:  90 tablet    Refill:  1   How to work for more weeks  Return in 4 weeks and probably send back light duty

## 2019-10-16 NOTE — Therapy (Signed)
[place], [place], [address] Phone: [phone]   Fax:  [phone]  Occupational Therapy Treatment  Patient Details  Name: Tina Pham MRN: [id number] Date of Birth: April 05, 1976 Referring Provider (OT): Arther Abbott, MD   Encounter Date: 10/15/2019  OT End of Session - 10/16/19 1326    Visit Number  14    Number of Visits  19    Date for OT Re-Evaluation  10/31/19    Authorization Type  [employer] other    Authorization Time Period  No authorization needed. PT/OT 30 visit limit. 28 remain 11/22/18-11/21/19    Authorization - Visit Number  16    Authorization - Number of Visits  28    OT Start Time  6222   Pt arrived late   OT Stop Time  1338    OT Time Calculation (min)  26 min    Activity Tolerance  Patient tolerated treatment well    Behavior During Therapy  WFL for tasks assessed/performed       Past Medical History:  Diagnosis Date  . Anxiety   . Arthritis   . Chronic back pain   . Chronic bronchitis ([place])   . Depression   . Diabetes mellitus    lost weight and is not on meds now.  . High cholesterol   . History of kidney stones   . Hypertension   . Migraine   . Neuropathic pain of foot   . Plantar fasciitis   . Sciatica   . Torn rotator cuff     Past Surgical History:  Procedure Laterality Date  . BACK SURGERY     herniated disc; had disc removed and then fusion; total of 3 surgeries  . EYE SURGERY Bilateral    removal of cyst and straightening of muscles  . KNEE ARTHROSCOPY WITH MEDIAL MENISECTOMY Left 12/27/2016   Procedure: LEFT KNEE DIAGNOSTIC ARTHROSCOPY;  Surgeon: Carole Civil, MD;  Location: [facility];  Service: Orthopedics;  Laterality: Left;  . SHOULDER OPEN ROTATOR CUFF REPAIR Right 09/19/2018   Procedure: ROTATOR CUFF REPAIR SHOULDER OPEN;  Surgeon: Carole Civil, MD;  Location: [facility];  Service: Orthopedics;  Laterality: Right;  . SHOULDER OPEN ROTATOR CUFF REPAIR  Right 08/05/2019   Procedure: ROTATOR CUFF REPAIR SHOULDER OPEN;  Surgeon: Carole Civil, MD;  Location: [facility];  Service: Orthopedics;  Laterality: Right;  . TUBAL LIGATION      There were no vitals filed for this visit.  Subjective Assessment - 10/15/19 1321    Subjective   S: I'm hoping he'll let me return to work. 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    Currently in Pain?  Yes    Pain Score  4     Pain Location  Shoulder    Pain Type  Acute pain         [facility] OT Assessment - 10/15/19 1314      Assessment   Medical Diagnosis  Right RTC repair      Precautions   Precautions  Shoulder    Type of Shoulder Precautions  P/ROM for 4 weeks (09/02/19) Sling may be removed at 4 weeks. Progress to AA/ROM on 09/02/19. A/ROM on 09/30/19      ROM / Strength   AROM / PROM / Strength  AROM;PROM;Strength      AROM   Overall AROM Comments  Assessed  standing. A/ROM not assessed prior to this session. IR/er adducted    AROM Assessment Site  Shoulder    Right/Left Shoulder  Right    Right Shoulder Flexion  163 Degrees   previous: 160   Right Shoulder ABduction  170 Degrees   previous: 142   Right Shoulder Internal Rotation  90 Degrees   previous: same   Right Shoulder External Rotation  90 Degrees   previous: same     PROM   Overall PROM   Within functional limits for tasks performed    Overall PROM Comments  Patient has full P/ROM in the shoulder for all ranges.       Strength   Overall Strength Comments  Assessed standing. IR/er adducted.     Strength Assessment Site  Shoulder    Right/Left Shoulder  Right    Right Shoulder Flexion  4-/5   previous: 3+/5   Right Shoulder ABduction  4/5   previous: 3+/5   Right Shoulder Internal Rotation  4+/5   previous: 3+/5   Right Shoulder External Rotation  5/5   previous: 4-/5              OT Treatments/Exercises (OP) - 10/15/19 1323      Exercises   Exercises   Shoulder      Shoulder Exercises: Standing   Horizontal ABduction  AROM;15 reps    Flexion  AROM;15 reps    ABduction  AROM;15 reps    Extension  Theraband;12 reps    Theraband Level (Shoulder Extension)  Level 2 (Red)    Row  Theraband;12 reps    Theraband Level (Shoulder Row)  Level 2 (Red)    Retraction  Theraband;12 reps    Theraband Level (Shoulder Retraction)  Level 2 (Red)      Shoulder Exercises: ROM/Strengthening   X to V Arms  10X A/ROM    Other ROM/Strengthening Exercises  Y arm lift off; 10X    Other ROM/Strengthening Exercises  proximal shoulder strengthening using washcloth at wall; 1' flexion 1' abduction      Shoulder Exercises: Stretch   Wall Stretch - Flexion  2 reps;30 seconds    Wall Stretch - ABduction  2 reps;30 seconds               OT Short Term Goals - 10/08/19 1347      OT SHORT TERM GOAL #1   Title  Patient will be educated and independent with HEP to faciliate progress in therapy and allow her to return to using her RUE as her dominant extremity for all daily and work activities.     Time  4    Period  Weeks    Target Date  09/17/19      OT SHORT TERM GOAL #2   Title  Patient will increase RUE P/ROM to WNL in order to complete upper body dressing tasks with less difficulty.    Time  4    Period  Weeks      OT SHORT TERM GOAL #3   Title  Pt will increase RUE strength to 3/5 in order to complete waist level tasks with less difficulty.    Time  4    Period  Weeks      OT SHORT TERM GOAL #4   Title  Patient will report a decrease in pain level of 3/10 when completing daily tasks with her RUE.    Time  4    Period  Weeks  Status  Partially Met      OT SHORT TERM GOAL #5   Title  Patient will demonstrate fascial restrictions of trace amount in her RUE in order to increase functional mobility needed to complete reaching tasks.    Time  4    Period  Weeks        OT Long Term Goals - 10/03/19 1056      OT LONG TERM GOAL #1    Title  Patient will return to highest level of independence while returning to work using her RUE for 75% or more of daily tasks.    Time  8    Period  Weeks    Status  On-going      OT LONG TERM GOAL #2   Title  Patient will increase A/ROM to WNL in order to increase ability to reach above shoulder level and into cabinets with less difficulty.    Time  8    Period  Weeks    Status  On-going      OT LONG TERM GOAL #3   Title  Patient will increase RUE strength to 4+/5 in order to lift normal houshold and work related items.    Time  8    Period  Weeks    Status  On-going      OT LONG TERM GOAL #4   Title  Patient will report a decrease in pain of approximately 2/10 or less when using her RUE for daily tasks.    Time  8    Period  Weeks    Status  On-going            Plan - 10/16/19 1327    Clinical Impression Statement  A: Measurements taken for MD appointment this afternoon. patient provided with print out. Session focused on A/ROM and shoulder stability exercises. patient demonstrated muscle fatigue and soreness during exercises. VC for form and technique were provided as needed. Pt took rest breaks as needed.    Body Structure / Function / Physical Skills  ADL;UE functional use;Fascial restriction;Pain;ROM;Scar mobility;Decreased knowledge of use of DME;Strength    Plan  P: Follow up on MD appointment. Continue with A/ROM based exercises unless MD states to progress to strengthening. Work on form during exercises.    Consulted and Agree with Plan of Care  Patient       Patient will benefit from skilled therapeutic intervention in order to improve the following deficits and impairments:   Body Structure / Function / Physical Skills: ADL, UE functional use, Fascial restriction, Pain, ROM, Scar mobility, Decreased knowledge of use of DME, Strength       Visit Diagnosis: Stiffness of right shoulder, not elsewhere classified  Other symptoms and signs involving the  musculoskeletal system  Acute pain of right shoulder    Problem List Patient Active Problem List   Diagnosis Date Noted  . S/P right rotator cuff repair open 09/19/18 repaired again 08/05/19 10/04/2018  . Complete tear of right rotator cuff   . Chronic pain of left knee   . Left shoulder pain 09/21/2015  . Disorders of bursae and tendons in shoulder region, unspecified 09/25/2013   Charle Mclaurin, OTR/L,CBIS  [phone]  10/16/2019, 1:37 PM  [facility] [address] [place], [place], [address] Phone: [phone]   Fax:  [phone]  Name: Tina Pham MRN: [id number] Date of Birth: 19-Aug-1975

## 2019-10-17 ENCOUNTER — Encounter (HOSPITAL_COMMUNITY): Payer: Self-pay | Admitting: Occupational Therapy

## 2019-10-17 ENCOUNTER — Ambulatory Visit (HOSPITAL_COMMUNITY): Payer: BLUE CROSS/BLUE SHIELD | Admitting: Occupational Therapy

## 2019-10-17 ENCOUNTER — Other Ambulatory Visit: Payer: Self-pay

## 2019-10-17 DIAGNOSIS — R29898 Other symptoms and signs involving the musculoskeletal system: Secondary | ICD-10-CM

## 2019-10-17 DIAGNOSIS — M25511 Pain in right shoulder: Secondary | ICD-10-CM

## 2019-10-17 DIAGNOSIS — M25611 Stiffness of right shoulder, not elsewhere classified: Secondary | ICD-10-CM

## 2019-10-17 NOTE — Therapy (Signed)
[place], [place], [address] Phone: [phone]   Fax:  [phone]  Occupational Therapy Treatment  Patient Details  Name: Tina Pham MRN: [id number] Date of Birth: [birth year] Referring Provider (OT): Arther Abbott, MD   Encounter Date: 10/17/2019  OT End of Session - 10/17/19 1254    Visit Number  15    Number of Visits  19    Date for OT Re-Evaluation  10/31/19    Authorization Type  [employer] other    Authorization Time Period  No authorization needed. PT/OT 30 visit limit. 28 remain 11/22/18-11/21/19    Authorization - Visit Number  44    Authorization - Number of Visits  28    OT Start Time  1037    OT Stop Time  1113    OT Time Calculation (min)  36 min    Activity Tolerance  Patient tolerated treatment well    Behavior During Therapy  WFL for tasks assessed/performed       Past Medical History:  Diagnosis Date  . Anxiety   . Arthritis   . Chronic back pain   . Chronic bronchitis ([place])   . Depression   . Diabetes mellitus    lost weight and is not on meds now.  . High cholesterol   . History of kidney stones   . Hypertension   . Migraine   . Neuropathic pain of foot   . Plantar fasciitis   . Sciatica   . Torn rotator cuff     Past Surgical History:  Procedure Laterality Date  . BACK SURGERY     herniated disc; had disc removed and then fusion; total of 3 surgeries  . EYE SURGERY Bilateral    removal of cyst and straightening of muscles  . KNEE ARTHROSCOPY WITH MEDIAL MENISECTOMY Left 12/27/2016   Procedure: LEFT KNEE DIAGNOSTIC ARTHROSCOPY;  Surgeon: Carole Civil, MD;  Location: [facility];  Service: Orthopedics;  Laterality: Left;  . SHOULDER OPEN ROTATOR CUFF REPAIR Right 09/19/2018   Procedure: ROTATOR CUFF REPAIR SHOULDER OPEN;  Surgeon: Carole Civil, MD;  Location: [facility];  Service: Orthopedics;  Laterality: Right;  . SHOULDER OPEN ROTATOR CUFF REPAIR Right 08/05/2019    Procedure: ROTATOR CUFF REPAIR SHOULDER OPEN;  Surgeon: Carole Civil, MD;  Location: [facility];  Service: Orthopedics;  Laterality: Right;  . TUBAL LIGATION      There were no vitals filed for this visit.  Subjective Assessment - 10/17/19 1252    Subjective   "I wish he would let me go back to work"    Pain Score  3     Pain Location  Shoulder    Pain Orientation  Right    Pain Descriptors / Indicators  Aching    Pain Type  Acute pain    Pain Onset  In the past 7 days    Pain Frequency  Constant    Aggravating Factors   Movement    Pain Relieving Factors  Heat and rest    Effect of Pain on Daily Activities  Minimal    Multiple Pain Sites  No         [facility] OT Assessment - 10/17/19 1045      Assessment   Medical Diagnosis  Right RTC repair      Precautions   Precautions  Shoulder    Type of Shoulder Precautions  P/ROM for 4 weeks (09/02/19) Sling may be removed at  4 weeks. Progress to AA/ROM on 09/02/19. A/ROM on 09/30/19      ROM / Strength   AROM / PROM / Strength  AROM;PROM;Strength               OT Treatments/Exercises (OP) - 10/17/19 1047      Exercises   Exercises  Shoulder      Shoulder Exercises: Supine   Protraction  PROM;5 reps;AROM;15 reps    Horizontal ABduction  PROM;5 reps;AROM;15 reps    External Rotation  PROM;5 reps;AROM;15 reps    Internal Rotation  PROM;5 reps;AROM;15 reps    Flexion  PROM;5 reps;AROM;15 reps    ABduction  PROM;5 reps;AROM;15 reps      Shoulder Exercises: Standing   Horizontal ABduction  AROM;15 reps    Flexion  AROM;15 reps    ABduction  AROM;15 reps    Extension  Theraband;15 reps    Theraband Level (Shoulder Extension)  Level 2 (Red)    Row  Theraband;15 reps    Theraband Level (Shoulder Row)  Level 2 (Red)    Retraction  Theraband;15 reps    Theraband Level (Shoulder Retraction)  Level 2 (Red)      Shoulder Exercises: ROM/Strengthening   UBE (Upper Arm Bike)  Level 2 2' forward 2' reverse    Over Head Lace   seated 2'    X to V Arms  10X A/ROM    Other ROM/Strengthening Exercises  Y arm lift off; 10X    Other ROM/Strengthening Exercises  proximal shoulder strengthening using washcloth at wall; 1' flexion 1' abduction      Shoulder Exercises: Stretch   Wall Stretch - Flexion  2 reps;30 seconds    Wall Stretch - ABduction  2 reps;30 seconds               OT Short Term Goals - 10/08/19 1347      OT SHORT TERM GOAL #1   Title  Patient will be educated and independent with HEP to faciliate progress in therapy and allow her to return to using her RUE as her dominant extremity for all daily and work activities.     Time  4    Period  Weeks    Target Date  09/17/19      OT SHORT TERM GOAL #2   Title  Patient will increase RUE P/ROM to WNL in order to complete upper body dressing tasks with less difficulty.    Time  4    Period  Weeks      OT SHORT TERM GOAL #3   Title  Pt will increase RUE strength to 3/5 in order to complete waist level tasks with less difficulty.    Time  4    Period  Weeks      OT SHORT TERM GOAL #4   Title  Patient will report a decrease in pain level of 3/10 when completing daily tasks with her RUE.    Time  4    Period  Weeks    Status  Partially Met      OT SHORT TERM GOAL #5   Title  Patient will demonstrate fascial restrictions of trace amount in her RUE in order to increase functional mobility needed to complete reaching tasks.    Time  4    Period  Weeks        OT Long Term Goals - 10/03/19 1056      OT LONG TERM GOAL #1   Title  Patient  will return to highest level of independence while returning to work using her RUE for 75% or more of daily tasks.    Time  8    Period  Weeks    Status  On-going      OT LONG TERM GOAL #2   Title  Patient will increase A/ROM to WNL in order to increase ability to reach above shoulder level and into cabinets with less difficulty.    Time  8    Period  Weeks    Status  On-going      OT LONG TERM GOAL #3    Title  Patient will increase RUE strength to 4+/5 in order to lift normal houshold and work related items.    Time  8    Period  Weeks    Status  On-going      OT LONG TERM GOAL #4   Title  Patient will report a decrease in pain of approximately 2/10 or less when using her RUE for daily tasks.    Time  8    Period  Weeks    Status  On-going            Plan - 10/17/19 1241    Clinical Impression Statement  A: Session focused on A/ROM and shoulder stability exercises. patient demonstrated minimal muscle fatigue and soreness during exercises. VC for form and technique were provided as needed.    Body Structure / Function / Physical Skills  ADL;UE functional use;Fascial restriction;Pain;ROM;Scar mobility;Decreased knowledge of use of DME;Strength    Plan  P: Start strengthening exercises    Consulted and Agree with Plan of Care  Patient       Patient will benefit from skilled therapeutic intervention in order to improve the following deficits and impairments:   Body Structure / Function / Physical Skills: ADL, UE functional use, Fascial restriction, Pain, ROM, Scar mobility, Decreased knowledge of use of DME, Strength       Visit Diagnosis: Stiffness of right shoulder, not elsewhere classified  Other symptoms and signs involving the musculoskeletal system  Acute pain of right shoulder    Problem List Patient Active Problem List   Diagnosis Date Noted  . S/P right rotator cuff repair open 09/19/18 repaired again 08/05/19 10/04/2018  . Complete tear of right rotator cuff   . Chronic pain of left knee   . Left shoulder pain 09/21/2015  . Disorders of bursae and tendons in shoulder region, unspecified 09/25/2013    Preston Fleeting, OTR/L 10/17/2019, 1:00 PM  [place], [place], [address] Phone: [phone]   Fax:  [phone]  Name: Tina Pham MRN: [id number] Date of Birth: 09/23/1975

## 2019-10-19 ENCOUNTER — Other Ambulatory Visit: Payer: Self-pay

## 2019-10-19 DIAGNOSIS — Z4889 Encounter for other specified surgical aftercare: Secondary | ICD-10-CM

## 2019-10-20 ENCOUNTER — Other Ambulatory Visit: Payer: Self-pay | Admitting: Orthopedic Surgery

## 2019-10-20 DIAGNOSIS — Z9889 Other specified postprocedural states: Secondary | ICD-10-CM

## 2019-10-20 MED ORDER — HYDROCODONE-ACETAMINOPHEN 5-325 MG PO TABS: 1.0000 | ORAL_TABLET | Freq: Four times a day (QID) | ORAL | 0 refills | Status: DC | PRN

## 2019-10-20 NOTE — Progress Notes (Signed)
?  Meds ordered this encounter  ?Medications  ? HYDROcodone-acetaminophen (NORCO/VICODIN) 5-325 MG tablet  ?  Sig: Take 1 tablet by mouth every 6 (six) hours as needed for moderate pain.  ?  Dispense:  30 tablet  ?  Refill:  0  ? ? ?

## 2019-10-22 ENCOUNTER — Other Ambulatory Visit: Payer: Self-pay

## 2019-10-22 ENCOUNTER — Ambulatory Visit (HOSPITAL_COMMUNITY): Payer: BLUE CROSS/BLUE SHIELD

## 2019-10-22 ENCOUNTER — Encounter (HOSPITAL_COMMUNITY): Payer: Self-pay

## 2019-10-22 DIAGNOSIS — M25511 Pain in right shoulder: Secondary | ICD-10-CM

## 2019-10-22 DIAGNOSIS — M25611 Stiffness of right shoulder, not elsewhere classified: Secondary | ICD-10-CM

## 2019-10-22 DIAGNOSIS — R29898 Other symptoms and signs involving the musculoskeletal system: Secondary | ICD-10-CM

## 2019-10-22 NOTE — Therapy (Signed)
[place], [place], [address] Phone: [phone]   Fax:  [phone]  Occupational Therapy Treatment  Patient Details  Name: Tina Pham MRN: [id number] Date of Birth: 1976/03/16 Referring Provider (OT): Arther Abbott, MD   Encounter Date: 10/22/2019  OT End of Session - 10/22/19 9024    Visit Number  16    Number of Visits  19    Date for OT Re-Evaluation  10/31/19    Authorization Type  [employer] other    Authorization Time Period  No authorization needed. PT/OT 30 visit limit. 28 remain 11/22/18-11/21/19    Authorization - Visit Number  18    Authorization - Number of Visits  28    OT Start Time  1600    OT Stop Time  1640    OT Time Calculation (min)  40 min    Activity Tolerance  Patient tolerated treatment well    Behavior During Therapy  WFL for tasks assessed/performed       Past Medical History:  Diagnosis Date  . Anxiety   . Arthritis   . Chronic back pain   . Chronic bronchitis ([place])   . Depression   . Diabetes mellitus    lost weight and is not on meds now.  . High cholesterol   . History of kidney stones   . Hypertension   . Migraine   . Neuropathic pain of foot   . Plantar fasciitis   . Sciatica   . Torn rotator cuff     Past Surgical History:  Procedure Laterality Date  . BACK SURGERY     herniated disc; had disc removed and then fusion; total of 3 surgeries  . EYE SURGERY Bilateral    removal of cyst and straightening of muscles  . KNEE ARTHROSCOPY WITH MEDIAL MENISECTOMY Left 12/27/2016   Procedure: LEFT KNEE DIAGNOSTIC ARTHROSCOPY;  Surgeon: Carole Civil, MD;  Location: [facility];  Service: Orthopedics;  Laterality: Left;  . SHOULDER OPEN ROTATOR CUFF REPAIR Right 09/19/2018   Procedure: ROTATOR CUFF REPAIR SHOULDER OPEN;  Surgeon: Carole Civil, MD;  Location: [facility];  Service: Orthopedics;  Laterality: Right;  . SHOULDER OPEN ROTATOR CUFF REPAIR Right 08/05/2019    Procedure: ROTATOR CUFF REPAIR SHOULDER OPEN;  Surgeon: Carole Civil, MD;  Location: [facility];  Service: Orthopedics;  Laterality: Right;  . TUBAL LIGATION      There were no vitals filed for this visit.  Subjective Assessment - 10/22/19 1626    Subjective   S: This arm is really bothering me today.    Currently in Pain?  Yes    Pain Score  3     Pain Location  Shoulder    Pain Type  Acute pain    Pain Radiating Towards  N/A    Pain Onset  In the past 7 days    Pain Frequency  Constant    Aggravating Factors   movement    Pain Relieving Factors  heat and rest    Effect of Pain on Daily Activities  minimal effect    Multiple Pain Sites  No         [facility] OT Assessment - 10/22/19 1648      Assessment   Medical Diagnosis  Right RTC repair      Precautions   Precautions  Shoulder    Type of Shoulder Precautions  Progress as tolerated  OT Treatments/Exercises (OP) - 10/22/19 1627      Exercises   Exercises  Shoulder      Shoulder Exercises: Supine   Protraction  PROM;5 reps;Strengthening;12 reps    Protraction Weight (lbs)  2    Horizontal ABduction  PROM;5 reps;Strengthening;12 reps    Horizontal ABduction Weight (lbs)  2    External Rotation  PROM;5 reps;Strengthening;12 reps    External Rotation Weight (lbs)  2    Internal Rotation  PROM;5 reps;Strengthening;12 reps    Internal Rotation Weight (lbs)  2    Flexion  PROM;5 reps;Strengthening;12 reps    Shoulder Flexion Weight (lbs)  2    ABduction  PROM;5 reps;Strengthening;10 reps    Shoulder ABduction Weight (lbs)  1      Shoulder Exercises: Standing   Protraction  Strengthening;12 reps    Protraction Weight (lbs)  1    Horizontal ABduction  Strengthening;12 reps    Horizontal ABduction Weight (lbs)  1    External Rotation  Strengthening;12 reps    External Rotation Weight (lbs)  1    Internal Rotation  Strengthening;12 reps    Internal Rotation Weight (lbs)  1    Flexion   Strengthening;12 reps    Shoulder Flexion Weight (lbs)  1    ABduction  Strengthening;12 reps      Shoulder Exercises: Therapy Ball   Other Therapy Ball Exercises  Green therapy ball: chest press 10X      Shoulder Exercises: ROM/Strengthening   X to V Arms  10X with 1#    Proximal Shoulder Strengthening, Supine  15X with 1# no rest breaks    Proximal Shoulder Strengthening, Seated  10X with 1# with no rest breaks    Other ROM/Strengthening Exercises  Y arm lift off; 10X               OT Short Term Goals - 10/08/19 1347      OT SHORT TERM GOAL #1   Title  Patient will be educated and independent with HEP to faciliate progress in therapy and allow her to return to using her RUE as her dominant extremity for all daily and work activities.     Time  4    Period  Weeks    Target Date  09/17/19      OT SHORT TERM GOAL #2   Title  Patient will increase RUE P/ROM to WNL in order to complete upper body dressing tasks with less difficulty.    Time  4    Period  Weeks      OT SHORT TERM GOAL #3   Title  Pt will increase RUE strength to 3/5 in order to complete waist level tasks with less difficulty.    Time  4    Period  Weeks      OT SHORT TERM GOAL #4   Title  Patient will report a decrease in pain level of 3/10 when completing daily tasks with her RUE.    Time  4    Period  Weeks    Status  Partially Met      OT SHORT TERM GOAL #5   Title  Patient will demonstrate fascial restrictions of trace amount in her RUE in order to increase functional mobility needed to complete reaching tasks.    Time  4    Period  Weeks        OT Long Term Goals - 10/03/19 1056      OT LONG TERM  GOAL #1   Title  Patient will return to highest level of independence while returning to work using her RUE for 75% or more of daily tasks.    Time  8    Period  Weeks    Status  On-going      OT LONG TERM GOAL #2   Title  Patient will increase A/ROM to WNL in order to increase ability to  reach above shoulder level and into cabinets with less difficulty.    Time  8    Period  Weeks    Status  On-going      OT LONG TERM GOAL #3   Title  Patient will increase RUE strength to 4+/5 in order to lift normal houshold and work related items.    Time  8    Period  Weeks    Status  On-going      OT LONG TERM GOAL #4   Title  Patient will report a decrease in pain of approximately 2/10 or less when using her RUE for daily tasks.    Time  8    Period  Weeks    Status  On-going            Plan - 10/22/19 1646    Clinical Impression Statement  A: Progressed to strengthening using 1-2# handweights while providing verbal cues for form and technique. Paitent was able to verbalize need for lower weight as needed. Rest breaks taken due to muscle fatigue.    Body Structure / Function / Physical Skills  ADL;UE functional use;Fascial restriction;Pain;ROM;Scar mobility;Decreased knowledge of use of DME;Strength    Plan  P: continue with strengthening. Therapy ball strengthening and ball on the wall.    Consulted and Agree with Plan of Care  Patient       Patient will benefit from skilled therapeutic intervention in order to improve the following deficits and impairments:   Body Structure / Function / Physical Skills: ADL, UE functional use, Fascial restriction, Pain, ROM, Scar mobility, Decreased knowledge of use of DME, Strength       Visit Diagnosis: Stiffness of right shoulder, not elsewhere classified  Other symptoms and signs involving the musculoskeletal system  Acute pain of right shoulder    Problem List Patient Active Problem List   Diagnosis Date Noted  . S/P right rotator cuff repair open 09/19/18 repaired again 08/05/19 10/04/2018  . Complete tear of right rotator cuff   . Chronic pain of left knee   . Left shoulder pain 09/21/2015  . Disorders of bursae and tendons in shoulder region, unspecified 09/25/2013   Marleena Shubert, OTR/L,CBIS   [phone]  10/22/2019, 4:48 PM  [place] [address] [place], [place], [address] Phone: [phone]   Fax:  [phone]  Name: Tina Pham MRN: [id number] Date of Birth: 1976/01/23

## 2019-10-24 ENCOUNTER — Encounter (HOSPITAL_COMMUNITY): Payer: Self-pay

## 2019-10-24 ENCOUNTER — Ambulatory Visit (HOSPITAL_COMMUNITY): Payer: BLUE CROSS/BLUE SHIELD | Attending: Orthopedic Surgery

## 2019-10-24 ENCOUNTER — Other Ambulatory Visit: Payer: Self-pay

## 2019-10-24 DIAGNOSIS — M25511 Pain in right shoulder: Secondary | ICD-10-CM | POA: Insufficient documentation

## 2019-10-24 DIAGNOSIS — R29898 Other symptoms and signs involving the musculoskeletal system: Secondary | ICD-10-CM | POA: Diagnosis present

## 2019-10-24 DIAGNOSIS — M25611 Stiffness of right shoulder, not elsewhere classified: Secondary | ICD-10-CM | POA: Insufficient documentation

## 2019-10-24 NOTE — Therapy (Signed)
[place], [place], [address] Phone: [phone]   Fax:  [phone]  Occupational Therapy Treatment  Patient Details  Name: Tina Pham MRN: [id number] Date of Birth: 21-May-1976 Referring Provider (OT): Arther Abbott, MD   Encounter Date: 10/24/2019  OT End of Session - 10/24/19 1107    Visit Number  17    Number of Visits  19    Date for OT Re-Evaluation  10/31/19    Authorization Type  [employer] other    Authorization Time Period  No authorization needed. PT/OT 30 visit limit. 28 remain 11/22/18-11/21/19    Authorization - Visit Number  9    Authorization - Number of Visits  28    OT Start Time  1030    OT Stop Time  1108    OT Time Calculation (min)  38 min    Activity Tolerance  Patient tolerated treatment well    Behavior During Therapy  WFL for tasks assessed/performed       Past Medical History:  Diagnosis Date  . Anxiety   . Arthritis   . Chronic back pain   . Chronic bronchitis ([place])   . Depression   . Diabetes mellitus    lost weight and is not on meds now.  . High cholesterol   . History of kidney stones   . Hypertension   . Migraine   . Neuropathic pain of foot   . Plantar fasciitis   . Sciatica   . Torn rotator cuff     Past Surgical History:  Procedure Laterality Date  . BACK SURGERY     herniated disc; had disc removed and then fusion; total of 3 surgeries  . EYE SURGERY Bilateral    removal of cyst and straightening of muscles  . KNEE ARTHROSCOPY WITH MEDIAL MENISECTOMY Left 12/27/2016   Procedure: LEFT KNEE DIAGNOSTIC ARTHROSCOPY;  Surgeon: Carole Civil, MD;  Location: [facility];  Service: Orthopedics;  Laterality: Left;  . SHOULDER OPEN ROTATOR CUFF REPAIR Right 09/19/2018   Procedure: ROTATOR CUFF REPAIR SHOULDER OPEN;  Surgeon: Carole Civil, MD;  Location: [facility];  Service: Orthopedics;  Laterality: Right;  . SHOULDER OPEN ROTATOR CUFF REPAIR Right 08/05/2019   Procedure: ROTATOR CUFF REPAIR SHOULDER OPEN;  Surgeon: Carole Civil, MD;  Location: [facility];  Service: Orthopedics;  Laterality: Right;  . TUBAL LIGATION      There were no vitals filed for this visit.  Subjective Assessment - 10/24/19 1040    Subjective   S:The cold weather today is not helping my shoulder.    Currently in Pain?  Yes    Pain Score  4     Pain Location  Shoulder    Pain Orientation  Right    Pain Descriptors / Indicators  Aching    Pain Type  Acute pain         [facility] OT Assessment - 10/24/19 1041      Assessment   Medical Diagnosis  Right RTC repair      Precautions   Precautions  Shoulder    Type of Shoulder Precautions  Progress as tolerated               OT Treatments/Exercises (OP) - 10/24/19 1041      Exercises   Exercises  Shoulder      Shoulder Exercises: Supine   Protraction  PROM;5 reps;Strengthening;12 reps    Protraction Weight (lbs)  2  Horizontal ABduction  PROM;5 reps;Strengthening;12 reps    Horizontal ABduction Weight (lbs)  2    External Rotation  PROM;5 reps;Strengthening;12 reps    External Rotation Weight (lbs)  2    Internal Rotation  PROM;5 reps;Strengthening;12 reps    Internal Rotation Weight (lbs)  2    Flexion  PROM;5 reps;Strengthening;12 reps    Shoulder Flexion Weight (lbs)  2    ABduction  PROM;5 reps;Strengthening;10 reps    Shoulder ABduction Weight (lbs)  1      Shoulder Exercises: Standing   Protraction  Strengthening;12 reps    Protraction Weight (lbs)  1    Horizontal ABduction  Strengthening;12 reps    Horizontal ABduction Weight (lbs)  1    External Rotation  Strengthening;12 reps    External Rotation Weight (lbs)  1    Internal Rotation  Strengthening;12 reps    Internal Rotation Weight (lbs)  1    Flexion  Strengthening;12 reps    Shoulder Flexion Weight (lbs)  1    ABduction  Strengthening;12 reps    Shoulder ABduction Weight (lbs)  1      Shoulder Exercises: ROM/Strengthening    UBE (Upper Arm Bike)  Level 2 2' forward 2' reverse   pace: 6.0-7.0   X to V Arms  10X with 1#    Proximal Shoulder Strengthening, Supine  15X with 1# no rest breaks    Proximal Shoulder Strengthening, Seated  12X with 1# with no rest breaks    Ball on Wall  1' flexion 1' abduction               OT Short Term Goals - 10/08/19 1347      OT SHORT TERM GOAL #1   Title  Patient will be educated and independent with HEP to faciliate progress in therapy and allow her to return to using her RUE as her dominant extremity for all daily and work activities.     Time  4    Period  Weeks    Target Date  09/17/19      OT SHORT TERM GOAL #2   Title  Patient will increase RUE P/ROM to WNL in order to complete upper body dressing tasks with less difficulty.    Time  4    Period  Weeks      OT SHORT TERM GOAL #3   Title  Pt will increase RUE strength to 3/5 in order to complete waist level tasks with less difficulty.    Time  4    Period  Weeks      OT SHORT TERM GOAL #4   Title  Patient will report a decrease in pain level of 3/10 when completing daily tasks with her RUE.    Time  4    Period  Weeks    Status  Partially Met      OT SHORT TERM GOAL #5   Title  Patient will demonstrate fascial restrictions of trace amount in her RUE in order to increase functional mobility needed to complete reaching tasks.    Time  4    Period  Weeks        OT Long Term Goals - 10/03/19 1056      OT LONG TERM GOAL #1   Title  Patient will return to highest level of independence while returning to work using her RUE for 75% or more of daily tasks.    Time  8    Period  Weeks  Status  On-going      OT LONG TERM GOAL #2   Title  Patient will increase A/ROM to WNL in order to increase ability to reach above shoulder level and into cabinets with less difficulty.    Time  8    Period  Weeks    Status  On-going      OT LONG TERM GOAL #3   Title  Patient will increase RUE strength to 4+/5 in  order to lift normal houshold and work related items.    Time  8    Period  Weeks    Status  On-going      OT LONG TERM GOAL #4   Title  Patient will report a decrease in pain of approximately 2/10 or less when using her RUE for daily tasks.    Time  8    Period  Weeks    Status  On-going            Plan - 10/24/19 1107    Clinical Impression Statement  A: Patient continued with strengthening using 2# weights supine and 1# weights standing. Soreness noted during exercises with rest breaks taken as needed. VC for form and technique.    Body Structure / Function / Physical Skills  ADL;UE functional use;Fascial restriction;Pain;ROM;Scar mobility;Decreased knowledge of use of DME;Strength    Plan  P: Continue working on strengthening and shoulder stability. Scapular theraband    Consulted and Agree with Plan of Care  Patient       Patient will benefit from skilled therapeutic intervention in order to improve the following deficits and impairments:   Body Structure / Function / Physical Skills: ADL, UE functional use, Fascial restriction, Pain, ROM, Scar mobility, Decreased knowledge of use of DME, Strength       Visit Diagnosis: Stiffness of right shoulder, not elsewhere classified  Other symptoms and signs involving the musculoskeletal system  Acute pain of right shoulder    Problem List Patient Active Problem List   Diagnosis Date Noted  . S/P right rotator cuff repair open 09/19/18 repaired again 08/05/19 10/04/2018  . Complete tear of right rotator cuff   . Chronic pain of left knee   . Left shoulder pain 09/21/2015  . Disorders of bursae and tendons in shoulder region, unspecified 09/25/2013    Jalayna Josten, OTR/L,CBIS  [phone]  10/24/2019, 1:53 PM  [place] [address] [place], [place], [address] Phone: [phone]   Fax:  [phone]  Name: EZRIE BUNYAN MRN: [id number] Date of Birth:  September 11, 1975

## 2019-10-26 ENCOUNTER — Other Ambulatory Visit: Payer: Self-pay

## 2019-10-26 DIAGNOSIS — Z9889 Other specified postprocedural states: Secondary | ICD-10-CM

## 2019-10-27 MED ORDER — HYDROCODONE-ACETAMINOPHEN 5-325 MG PO TABS: 1.0000 | ORAL_TABLET | Freq: Four times a day (QID) | ORAL | 0 refills | Status: DC | PRN

## 2019-10-29 ENCOUNTER — Ambulatory Visit (HOSPITAL_COMMUNITY): Payer: BLUE CROSS/BLUE SHIELD

## 2019-10-29 ENCOUNTER — Telehealth (HOSPITAL_COMMUNITY): Payer: Self-pay

## 2019-10-29 NOTE — Telephone Encounter (Signed)
pt cancelled appt for today because she got her 2nd COVID shot and she is sick from it

## 2019-10-31 ENCOUNTER — Ambulatory Visit (HOSPITAL_COMMUNITY): Payer: BLUE CROSS/BLUE SHIELD

## 2019-10-31 ENCOUNTER — Other Ambulatory Visit: Payer: Self-pay

## 2019-10-31 ENCOUNTER — Encounter (HOSPITAL_COMMUNITY): Payer: Self-pay

## 2019-10-31 DIAGNOSIS — M25511 Pain in right shoulder: Secondary | ICD-10-CM

## 2019-10-31 DIAGNOSIS — M25611 Stiffness of right shoulder, not elsewhere classified: Secondary | ICD-10-CM

## 2019-10-31 DIAGNOSIS — R29898 Other symptoms and signs involving the musculoskeletal system: Secondary | ICD-10-CM

## 2019-10-31 NOTE — Therapy (Signed)
[facility] [place], [place], [address] Phone: [phone]   Fax:  [phone]  Occupational Therapy Treatment reassessment Patient Details  Name: Tina Pham MRN: [id number] Date of Birth: [birth year] Referring Provider (OT): Arther Abbott, MD   Encounter Date: 10/31/2019  OT End of Session - 10/31/19 1053    Visit Number  18    Number of Visits  22    Date for OT Re-Evaluation  11/14/19    Authorization Type  [employer] other    Authorization Time Period  No authorization needed. PT/OT 30 visit limit. 28 remain 11/22/18-11/21/19    Authorization - Visit Number  20    Authorization - Number of Visits  28    OT Start Time  1035   reassessment   OT Stop Time  1110    OT Time Calculation (min)  35 min    Activity Tolerance  Patient tolerated treatment well    Behavior During Therapy  WFL for tasks assessed/performed       Past Medical History:  Diagnosis Date  . Anxiety   . Arthritis   . Chronic back pain   . Chronic bronchitis ([place])   . Depression   . Diabetes mellitus    lost weight and is not on meds now.  . High cholesterol   . History of kidney stones   . Hypertension   . Migraine   . Neuropathic pain of foot   . Plantar fasciitis   . Sciatica   . Torn rotator cuff     Past Surgical History:  Procedure Laterality Date  . BACK SURGERY     herniated disc; had disc removed and then fusion; total of 3 surgeries  . EYE SURGERY Bilateral    removal of cyst and straightening of muscles  . KNEE ARTHROSCOPY WITH MEDIAL MENISECTOMY Left 12/27/2016   Procedure: LEFT KNEE DIAGNOSTIC ARTHROSCOPY;  Surgeon: Carole Civil, MD;  Location: [facility];  Service: Orthopedics;  Laterality: Left;  . SHOULDER OPEN ROTATOR CUFF REPAIR Right 09/19/2018   Procedure: ROTATOR CUFF REPAIR SHOULDER OPEN;  Surgeon: Carole Civil, MD;  Location: [facility];  Service: Orthopedics;  Laterality: Right;  . SHOULDER OPEN ROTATOR CUFF  REPAIR Right 08/05/2019   Procedure: ROTATOR CUFF REPAIR SHOULDER OPEN;  Surgeon: Carole Civil, MD;  Location: [facility];  Service: Orthopedics;  Laterality: Right;  . TUBAL LIGATION      There were no vitals filed for this visit.  Subjective Assessment - 10/31/19 1038    Currently in Pain?  Yes    Pain Score  3     Pain Location  Shoulder    Pain Orientation  Right    Pain Descriptors / Indicators  Sore    Pain Type  Acute pain    Pain Radiating Towards  N/A    Pain Onset  In the past 7 days    Pain Frequency  Constant    Aggravating Factors   movement    Pain Relieving Factors  heat and rest    Effect of Pain on Daily Activities  minimal effect    Multiple Pain Sites  No         [facility] OT Assessment - 10/31/19 1039      Assessment   Medical Diagnosis  Right RTC repair    Referring Provider (OT)  Arther Abbott, MD    Onset Date/Surgical Date  08/05/19      Precautions  Precautions  Shoulder    Type of Shoulder Precautions  Progress as tolerated      Prior Function   Level of Independence  Independent      Observation/Other Assessments   Focus on Therapeutic Outcomes (FOTO)   70/100      ROM / Strength   AROM / PROM / Strength  AROM;PROM;Strength      AROM   Overall AROM Comments  Assessed standing. A/ROM not assessed prior to this session. IR/er adducted    AROM Assessment Site  Shoulder    Right/Left Shoulder  Right    Right Shoulder Flexion  178 Degrees   previous: 163   Right Shoulder ABduction  180 Degrees   previous: 170   Right Shoulder Internal Rotation  90 Degrees   previous: same   Right Shoulder External Rotation  90 Degrees   previous: same     PROM   Overall PROM Comments  Patient has full P/ROM in the shoulder for all ranges.       Strength   Overall Strength Comments  Assessed standing. IR/er adducted.     Strength Assessment Site  Shoulder    Right/Left Shoulder  Right    Right Shoulder Flexion  5/5   previous: 4-/5   Right  Shoulder ABduction  4+/5   previous: 4/5   Right Shoulder Internal Rotation  5/5   previous: 4+/5   Right Shoulder External Rotation  5/5   previous: same              OT Treatments/Exercises (OP) - 10/31/19 1103      Shoulder Exercises: Therapy Diona Foley   Other Therapy Ball Exercises  Green therapy ball: chest press 12X      Shoulder Exercises: ROM/Strengthening   UBE (Upper Arm Bike)  Level 2 reverse only 3'   pace: 7.0-8.0   Ball on Wall  1' flexion 1' abduction               OT Short Term Goals - 10/31/19 1055      OT SHORT TERM GOAL #1   Title  Patient will be educated and independent with HEP to faciliate progress in therapy and allow her to return to using her RUE as her dominant extremity for all daily and work activities.     Time  4    Period  Weeks    Target Date  09/17/19      OT SHORT TERM GOAL #2   Title  Patient will increase RUE P/ROM to WNL in order to complete upper body dressing tasks with less difficulty.    Time  4    Period  Weeks      OT SHORT TERM GOAL #3   Title  Pt will increase RUE strength to 3/5 in order to complete waist level tasks with less difficulty.    Time  4    Period  Weeks      OT SHORT TERM GOAL #4   Title  Patient will report a decrease in pain level of 3/10 when completing daily tasks with her RUE.    Time  4    Period  Weeks    Status  Partially Met      OT SHORT TERM GOAL #5   Title  Patient will demonstrate fascial restrictions of trace amount in her RUE in order to increase functional mobility needed to complete reaching tasks.    Time  4    Period  Weeks  OT Long Term Goals - 10/31/19 1055      OT LONG TERM GOAL #1   Title  Patient will return to highest level of independence while returning to work using her RUE for 75% or more of daily tasks.    Time  8    Period  Weeks    Status  On-going      OT LONG TERM GOAL #2   Title  Patient will increase A/ROM to WNL in order to increase ability to  reach above shoulder level and into cabinets with less difficulty.    Time  8    Period  Weeks    Status  Achieved      OT LONG TERM GOAL #3   Title  Patient will increase RUE strength to 4+/5 in order to lift normal houshold and work related items.    Time  8    Period  Weeks    Status  Achieved      OT LONG TERM GOAL #4   Title  Patient will report a decrease in pain of approximately 2/10 or less when using her RUE for daily tasks.    Time  8    Period  Weeks    Status  On-going            Plan - 10/31/19 1056    Clinical Impression Statement  A: reassessment completed this date. patient has met all but one short term goal (for pain) and 2/4 LTGs this date. She is able to demonstrate full A/ROM and shoulder strength. Continues to have slight deficit related to shoulder and scapular stability so recommended 2 more weeks to focus mainly on this area.    Body Structure / Function / Physical Skills  ADL;UE functional use;Fascial restriction;Pain;ROM;Scar mobility;Decreased knowledge of use of DME;Strength    Plan  P: Continue OT services 3 more times in the next 2 weeks focusing mainly on shoulder strength and stability in order to return to work and complete tasks without difficulty. Update HEP.    Consulted and Agree with Plan of Care  Patient       Patient will benefit from skilled therapeutic intervention in order to improve the following deficits and impairments:   Body Structure / Function / Physical Skills: ADL, UE functional use, Fascial restriction, Pain, ROM, Scar mobility, Decreased knowledge of use of DME, Strength       Visit Diagnosis: Stiffness of right shoulder, not elsewhere classified  Other symptoms and signs involving the musculoskeletal system  Acute pain of right shoulder    Problem List Patient Active Problem List   Diagnosis Date Noted  . S/P right rotator cuff repair open 09/19/18 repaired again 08/05/19 10/04/2018  . Complete tear of right  rotator cuff   . Chronic pain of left knee   . Left shoulder pain 09/21/2015  . Disorders of bursae and tendons in shoulder region, unspecified 09/25/2013   Tonye Tancredi, OTR/L,CBIS  [phone]  10/31/2019, 12:30 PM  [facility] [address] [place], [place], [address] Phone: [phone]   Fax:  [phone]  Name: Tina Pham MRN: [id number] Date of Birth: 06-24-1976

## 2019-11-02 ENCOUNTER — Other Ambulatory Visit: Payer: Self-pay

## 2019-11-02 DIAGNOSIS — Z4889 Encounter for other specified surgical aftercare: Secondary | ICD-10-CM

## 2019-11-02 DIAGNOSIS — Z9889 Other specified postprocedural states: Secondary | ICD-10-CM

## 2019-11-03 MED ORDER — HYDROCODONE-ACETAMINOPHEN 5-325 MG PO TABS: 1.0000 | ORAL_TABLET | Freq: Four times a day (QID) | ORAL | 0 refills | Status: DC | PRN

## 2019-11-03 MED ORDER — IBUPROFEN 800 MG PO TABS: 800.0000 mg | ORAL_TABLET | Freq: Three times a day (TID) | ORAL | 5 refills | Status: DC

## 2019-11-05 ENCOUNTER — Ambulatory Visit (HOSPITAL_COMMUNITY): Payer: BLUE CROSS/BLUE SHIELD

## 2019-11-05 ENCOUNTER — Encounter (HOSPITAL_COMMUNITY): Payer: Self-pay

## 2019-11-05 ENCOUNTER — Other Ambulatory Visit: Payer: Self-pay

## 2019-11-05 DIAGNOSIS — M25611 Stiffness of right shoulder, not elsewhere classified: Secondary | ICD-10-CM

## 2019-11-05 DIAGNOSIS — R29898 Other symptoms and signs involving the musculoskeletal system: Secondary | ICD-10-CM

## 2019-11-05 DIAGNOSIS — M25511 Pain in right shoulder: Secondary | ICD-10-CM

## 2019-11-05 NOTE — Therapy (Signed)
[facility] [place], [place], [address] Phone: [phone]   Fax:  [phone]  Occupational Therapy Treatment  Patient Details  Name: Tina Pham MRN: [id number] Date of Birth: 1975-09-13 Referring Provider (OT): Arther Abbott, MD   Encounter Date: 11/05/2019  OT End of Session - 11/05/19 1137    Visit Number  19    Number of Visits  22    Date for OT Re-Evaluation  11/14/19    Authorization Type  [employer] other    Authorization Time Period  No authorization needed. PT/OT 30 visit limit. 28 remain 11/22/18-11/21/19    Authorization - Visit Number  21    Authorization - Number of Visits  28    OT Start Time  5597   Arrived late   OT Stop Time  1110    OT Time Calculation (min)  27 min    Activity Tolerance  Patient tolerated treatment well    Behavior During Therapy  WFL for tasks assessed/performed       Past Medical History:  Diagnosis Date  . Anxiety   . Arthritis   . Chronic back pain   . Chronic bronchitis ([place])   . Depression   . Diabetes mellitus    lost weight and is not on meds now.  . High cholesterol   . History of kidney stones   . Hypertension   . Migraine   . Neuropathic pain of foot   . Plantar fasciitis   . Sciatica   . Torn rotator cuff     Past Surgical History:  Procedure Laterality Date  . BACK SURGERY     herniated disc; had disc removed and then fusion; total of 3 surgeries  . EYE SURGERY Bilateral    removal of cyst and straightening of muscles  . KNEE ARTHROSCOPY WITH MEDIAL MENISECTOMY Left 12/27/2016   Procedure: LEFT KNEE DIAGNOSTIC ARTHROSCOPY;  Surgeon: Carole Civil, MD;  Location: [facility];  Service: Orthopedics;  Laterality: Left;  . SHOULDER OPEN ROTATOR CUFF REPAIR Right 09/19/2018   Procedure: ROTATOR CUFF REPAIR SHOULDER OPEN;  Surgeon: Carole Civil, MD;  Location: [facility];  Service: Orthopedics;  Laterality: Right;  . SHOULDER OPEN ROTATOR CUFF REPAIR  Right 08/05/2019   Procedure: ROTATOR CUFF REPAIR SHOULDER OPEN;  Surgeon: Carole Civil, MD;  Location: [facility];  Service: Orthopedics;  Laterality: Right;  . TUBAL LIGATION      There were no vitals filed for this visit.  Subjective Assessment - 11/05/19 1047    Subjective   S: It's doing ok today.    Currently in Pain?  Yes    Pain Score  3     Pain Location  Shoulder    Pain Orientation  Right    Pain Descriptors / Indicators  Sore    Pain Type  Acute pain         [facility] OT Assessment - 11/05/19 1047      Assessment   Medical Diagnosis  Right RTC repair      Precautions   Precautions  Shoulder    Type of Shoulder Precautions  Progress as tolerated               OT Treatments/Exercises (OP) - 11/05/19 1047      Exercises   Exercises  Shoulder      Shoulder Exercises: Therapy Ball   Other Therapy Ball Exercises  Green therapy ball: chest press, circles (right/left), and flexion  15X      Shoulder Exercises: ROM/Strengthening   UBE (Upper Arm Bike)  Level 2 reverse only 3'   pace: 7.0-8.0   Other ROM/Strengthening Exercises  Red loop band; 12X Y arm lift off, clock slides (2,3,4)    Other ROM/Strengthening Exercises  Red loop band for lower trapezius strengthening; 12X             OT Education - 11/05/19 1136    Education Details  red band loop: wall slides and clock slides. Discussed holding a 1lb weight, can of soup or water bottle to complete the previous A/ROM shoulder exericses.    Person(s) Educated  Patient    Methods  Explanation;Handout    Comprehension  Verbalized understanding       OT Short Term Goals - 10/31/19 1055      OT SHORT TERM GOAL #1   Title  Patient will be educated and independent with HEP to faciliate progress in therapy and allow her to return to using her RUE as her dominant extremity for all daily and work activities.     Time  4    Period  Weeks    Target Date  09/17/19      OT SHORT TERM GOAL #2   Title   Patient will increase RUE P/ROM to WNL in order to complete upper body dressing tasks with less difficulty.    Time  4    Period  Weeks      OT SHORT TERM GOAL #3   Title  Pt will increase RUE strength to 3/5 in order to complete waist level tasks with less difficulty.    Time  4    Period  Weeks      OT SHORT TERM GOAL #4   Title  Patient will report a decrease in pain level of 3/10 when completing daily tasks with her RUE.    Time  4    Period  Weeks    Status  Partially Met      OT SHORT TERM GOAL #5   Title  Patient will demonstrate fascial restrictions of trace amount in her RUE in order to increase functional mobility needed to complete reaching tasks.    Time  4    Period  Weeks        OT Long Term Goals - 11/05/19 1140      OT LONG TERM GOAL #1   Title  Patient will return to highest level of independence while returning to work using her RUE for 75% or more of daily tasks.    Time  8    Period  Weeks    Status  On-going      OT LONG TERM GOAL #2   Title  Patient will increase A/ROM to WNL in order to increase ability to reach above shoulder level and into cabinets with less difficulty.    Time  8    Period  Weeks      OT LONG TERM GOAL #3   Title  Patient will increase RUE strength to 4+/5 in order to lift normal houshold and work related items.    Time  8    Period  Weeks      OT LONG TERM GOAL #4   Title  Patient will report a decrease in pain of approximately 2/10 or less when using her RUE for daily tasks.    Time  8    Period  Weeks    Status  On-going            Plan - 11/05/19 1137    Clinical Impression Statement  A: Focused on shoulder strengthening and stability during session. Added red band loop at wall while adding to HEP as well. patient interested in toning exercises for HEP as well and will be provided next session. VC for form and technique provided. Rest breaks taken as needed.    Body Structure / Function / Physical Skills  ADL;UE  functional use;Fascial restriction;Pain;ROM;Scar mobility;Decreased knowledge of use of DME;Strength    Plan  P: Provide 15 minute arm toning workout and complete with patient.    Consulted and Agree with Plan of Care  Patient       Patient will benefit from skilled therapeutic intervention in order to improve the following deficits and impairments:   Body Structure / Function / Physical Skills: ADL, UE functional use, Fascial restriction, Pain, ROM, Scar mobility, Decreased knowledge of use of DME, Strength       Visit Diagnosis: Acute pain of right shoulder  Other symptoms and signs involving the musculoskeletal system  Stiffness of right shoulder, not elsewhere classified    Problem List Patient Active Problem List   Diagnosis Date Noted  . S/P right rotator cuff repair open 09/19/18 repaired again 08/05/19 10/04/2018  . Complete tear of right rotator cuff   . Chronic pain of left knee   . Left shoulder pain 09/21/2015  . Disorders of bursae and tendons in shoulder region, unspecified 09/25/2013   Rajni Holsworth, OTR/L,CBIS  [phone]  11/05/2019, 11:40 AM  [facility] [address] [place], [place], [address] Phone: [phone]   Fax:  [phone]  Name: Tina Pham MRN: [id number] Date of Birth: 1976-01-05

## 2019-11-05 NOTE — Patient Instructions (Signed)
Resisted Shoulder Wall Slides   Stand facing a wall with elbows bent at shoulder height. Press forearms out against the band, initiating from shoulder. Then raise arms up long the wall  to form a "Y" as shown. Then press hands out further against band and lift hands off of wall, recruiting lower traps and scapular muscles as shown. Keep abdominals engaged and do not lean back. Return hands to wall and repeat. 12 times.        scapular clocks    start with hands on wall . Then with right arm pull band into different clock positions  with either arm. (2 o'clock 12 times. 3 o'clock 12 times. 4 o'clock 12 times.)

## 2019-11-07 ENCOUNTER — Other Ambulatory Visit: Payer: Self-pay

## 2019-11-07 ENCOUNTER — Encounter (HOSPITAL_COMMUNITY): Payer: Self-pay

## 2019-11-07 ENCOUNTER — Ambulatory Visit (HOSPITAL_COMMUNITY): Payer: BLUE CROSS/BLUE SHIELD

## 2019-11-07 ENCOUNTER — Encounter: Payer: BLUE CROSS/BLUE SHIELD | Admitting: Obstetrics & Gynecology

## 2019-11-07 DIAGNOSIS — M25511 Pain in right shoulder: Secondary | ICD-10-CM

## 2019-11-07 DIAGNOSIS — R29898 Other symptoms and signs involving the musculoskeletal system: Secondary | ICD-10-CM

## 2019-11-07 DIAGNOSIS — M25611 Stiffness of right shoulder, not elsewhere classified: Secondary | ICD-10-CM

## 2019-11-07 NOTE — Therapy (Signed)
[facility] [address] [place], [place], [address] Phone: [phone]   Fax:  [phone]  Occupational Therapy Treatment  Patient Details  Name: Tina Pham MRN: [id number] Date of Birth: 01/03/1976 Referring Provider (OT): [name], MD   Encounter Date: 11/07/2019  OT End of Session - 11/07/19 1115    Visit Number  20    Number of Visits  22    Date for OT Re-Evaluation  11/14/19    Authorization Type  [employer] other    Authorization Time Period  No authorization needed. PT/OT 30 visit limit. 28 remain 11/22/18-11/21/19    Authorization - Visit Number  22    Authorization - Number of Visits  28    OT Start Time  0945    OT Stop Time  1023    OT Time Calculation (min)  38 min    Activity Tolerance  Patient tolerated treatment well    Behavior During Therapy  WFL for tasks assessed/performed       Past Medical History:  Diagnosis Date  . Anxiety   . Arthritis   . Chronic back pain   . Chronic bronchitis ([facility])   . Depression   . Diabetes mellitus    lost weight and is not on meds now.  . High cholesterol   . History of kidney stones   . Hypertension   . Migraine   . Neuropathic pain of foot   . Plantar fasciitis   . Sciatica   . Torn rotator cuff     Past Surgical History:  Procedure Laterality Date  . BACK SURGERY     herniated disc; had disc removed and then fusion; total of 3 surgeries  . EYE SURGERY Bilateral    removal of cyst and straightening of muscles  . KNEE ARTHROSCOPY WITH MEDIAL MENISECTOMY Left 12/27/2016   Procedure: LEFT KNEE DIAGNOSTIC ARTHROSCOPY;  Surgeon: Harrison, Stanley E, MD;  Location: [facility];  Service: Orthopedics;  Laterality: Left;  . SHOULDER OPEN ROTATOR CUFF REPAIR Right 09/19/2018   Procedure: ROTATOR CUFF REPAIR SHOULDER OPEN;  Surgeon: Harrison, Stanley E, MD;  Location: [facility];  Service: Orthopedics;  Laterality: Right;  . SHOULDER OPEN ROTATOR CUFF REPAIR Right 08/05/2019   Procedure: ROTATOR CUFF REPAIR SHOULDER OPEN;  Surgeon: Harrison, Stanley E, MD;  Location: [facility];  Service: Orthopedics;  Laterality: Right;  . TUBAL LIGATION      There were no vitals filed for this visit.  Subjective Assessment - 11/07/19 1009    Subjective   S: doing ok.    Currently in Pain?  Yes    Pain Score  2     Pain Location  Shoulder    Pain Orientation  Right    Pain Descriptors / Indicators  Sore    Pain Type  Acute pain    Pain Radiating Towards  N/A    Pain Onset  In the past 7 days    Pain Frequency  Constant    Aggravating Factors   movement    Pain Relieving Factors  heat and rest    Effect of Pain on Daily Activities  minimal effect    Multiple Pain Sites  No         [facility] OT Assessment - 11/07/19 1010      Assessment   Medical Diagnosis  Right RTC repair      Precautions   Precautions  Shoulder    Type of Shoulder Precautions  Progress   as tolerated               OT Treatments/Exercises (OP) - 11/07/19 1010      Exercises   Exercises  Shoulder      Shoulder Exercises: Standing   Flexion  AROM;10 reps    ABduction  AROM;10 reps      Shoulder Exercises: ROM/Strengthening   Cybex Press  1 plate;10 reps    X to V Arms  12X with 1#    Other ROM/Strengthening Exercises  Arms on Fire; 3 minutes total. 30 second each    Other ROM/Strengthening Exercises  15 minute arm circuit with 1#; 20 on and 30 off; biceps, triceps, and shoulder ranges      Shoulder Exercises: Body Blade   Flexion  30 seconds   10X; hold at top of range at last repetition   ABduction  30 seconds   10X; hold at top of range at last repetition            OT Education - 11/07/19 1119    Education Details  15 minute arm toning circuit provided for HEP using 1lb weights.    Person(s) Educated  Patient    Methods  Explanation;Demonstration;Verbal cues;Handout    Comprehension  Returned demonstration;Verbalized understanding       OT Short Term Goals - 10/31/19  1055      OT SHORT TERM GOAL #1   Title  Patient will be educated and independent with HEP to faciliate progress in therapy and allow her to return to using her RUE as her dominant extremity for all daily and work activities.     Time  4    Period  Weeks    Target Date  09/17/19      OT SHORT TERM GOAL #2   Title  Patient will increase RUE P/ROM to WNL in order to complete upper body dressing tasks with less difficulty.    Time  4    Period  Weeks      OT SHORT TERM GOAL #3   Title  Pt will increase RUE strength to 3/5 in order to complete waist level tasks with less difficulty.    Time  4    Period  Weeks      OT SHORT TERM GOAL #4   Title  Patient will report a decrease in pain level of 3/10 when completing daily tasks with her RUE.    Time  4    Period  Weeks    Status  Partially Met      OT SHORT TERM GOAL #5   Title  Patient will demonstrate fascial restrictions of trace amount in her RUE in order to increase functional mobility needed to complete reaching tasks.    Time  4    Period  Weeks        OT Long Term Goals - 11/05/19 1140      OT LONG TERM GOAL #1   Title  Patient will return to highest level of independence while returning to work using her RUE for 75% or more of daily tasks.    Time  8    Period  Weeks    Status  On-going      OT LONG TERM GOAL #2   Title  Patient will increase A/ROM to WNL in order to increase ability to reach above shoulder level and into cabinets with less difficulty.    Time  8    Period  Weeks        OT LONG TERM GOAL #3   Title  Patient will increase RUE strength to 4+/5 in order to lift normal houshold and work related items.    Time  8    Period  Weeks      OT LONG TERM GOAL #4   Title  Patient will report a decrease in pain of approximately 2/10 or less when using her RUE for daily tasks.    Time  8    Period  Weeks    Status  On-going            Plan - 11/07/19 1115    Clinical Impression Statement  A: Shoulder  strengthening and stability exercises completed this date including 15 minute arm toning circuit and arms on fire. patient required several rest breaks as needed due to arm fatigue. VC for form and technique were provided.    Body Structure / Function / Physical Skills  ADL;UE functional use;Fascial restriction;Pain;ROM;Scar mobility;Decreased knowledge of use of DME;Strength    Plan  P: reassess and discharge with updated HEP. Complete arms on fire and 15 minute arm circuit once more.    Consulted and Agree with Plan of Care  Patient       Patient will benefit from skilled therapeutic intervention in order to improve the following deficits and impairments:   Body Structure / Function / Physical Skills: ADL, UE functional use, Fascial restriction, Pain, ROM, Scar mobility, Decreased knowledge of use of DME, Strength       Visit Diagnosis: Acute pain of right shoulder  Other symptoms and signs involving the musculoskeletal system  Stiffness of right shoulder, not elsewhere classified    Problem List Patient Active Problem List   Diagnosis Date Noted  . S/P right rotator cuff repair open 09/19/18 repaired again 08/05/19 10/04/2018  . Complete tear of right rotator cuff   . Chronic pain of left knee   . Left shoulder pain 09/21/2015  . Disorders of bursae and tendons in shoulder region, unspecified 09/25/2013   Mariella Essenmacher, OTR/L,CBIS  [phone]  11/07/2019, 11:20 AM  [facility] [address] [place], , [address] Phone: [phone]   Fax:  [phone]  Name: Tina Pham MRN: [id number] Date of Birth: 10/16/1975 

## 2019-11-07 NOTE — Patient Instructions (Addendum)
15-Minute Upper B15-Minute Upper Body Workout Equipment I Used: Marland Kitchen Set of 5lb hand weights (I recommend using anywhere from 2-8 lbs, depending on fitness level) . Phone timer or [employer] phone app (iPhone: interval timer) - set it so it will beep every 20 seconds This workout is broken up into three circuits. The second is slightly longer than the first and last circuits. Limit rest time in between each circuit to 30-60 seconds. As you'll see in the descriptions below, you'll stay on each exercise for 20 seconds. If you're more advanced, you can up this to 30 seconds. As always, these workouts are a framework--customize them as needed! ody [employer] I Used: Marland Kitchen Set of 5lb hand weights (I recommend using anywhere from 2-8 lbs, depending on fitness level) . Phone timer or [employer] phone app (iPhone: interval timer) - set it so it will beep every 20 seconds This workout is broken up into three circuits. The second is slightly longer than the first and last circuits. Limit rest time in between each circuit to 30-60 seconds. As you'll see in the descriptions below, you'll stay on each exercise for 20 seconds. If you're more advanced, you can up this to 30 seconds. As always, these workouts are a framework--customize them as needed!          Circuit 1  Perform each of the three exercises for 20 seconds before moving onto the next. No rest in between exercises. Once you've finished all three, rest for 20 seconds and repeat the circuit twice more for a total of three times through. Jaquelyn Bitter Kickbacks: Bend your knees slightly and lean your torso forward slightly with a straight back (pull those abs in tight!). Arms are at your sides, elbows bent. Keeping your upper arms glued to your sides and just hinging at the elbow, send the weights behind you, extending your arms and squeezing your triceps. Pause for a second at your fullest extension, then slowly bring the weights forward, again by bending the  elbows. . Triceps Straight-Arm Lift: Hold your arms straight after your final kickback and proceed to lift and lower them. This will be a small movement, you don't want to let your arms drop all the way below your hips. Lift up as high as possible behind your back and then release down just a few inches. . Triceps Squeezes: Holding your arms straight behind you, palms facing each other, squeeze the weights in towards each other and then release back out. This is a small movement--think of it as a pulse. Circuit 2  Perform each of the four exercises for 20 seconds before moving onto the next. No rest in between exercises. Once you've finished all three, rest for 20 seconds and repeat the circuit twice more for a total of three times through. . Bicep Curl: Do these holding your elbows at about shoulder height. Ann Lions Shuffle: Extending your arms in front of you with just a soft bend to the elbows, palms facing up, shuffle the weights one over the other keeping hands at about armpit height. . Flip the Cup: Extend arms out to your sides with palms facing up. Rotate your hands down, flipping your palms to then face the floor--it's like a little scooping motion. Try to keep your hands right around armpit/shoulder height the whole time as you flip back and forth. Janey Greaser Pulldown: Arms still outstretched to the sides from flip-the-cup, palms facing forward, bend your elbows as you pull them down and behind  your back, squeezing your shoulder blades together as you do so. Extend arms back up and out to the sides. Circuit 3  Perform each of the three exercises for 20 seconds before moving onto the next. No rest in between exercises. Once you've finished all three, rest for 20 seconds and repeat the circuit twice more for a total of three times through. . Shoulder Press: Start with arms in goal post position: elbows bent at 90 degrees at shoulder height. From here, press your hands up overhead, bringing weights  together above your head. Lower back down, but only so far as brings your elbows back to shoulder height. Don't let them dip down lower than that. As you do these, be careful not to shrug your shoulders up towards your ears. . Shoulder Shaper: Start with arms in goal post position: elbows bent at 90 degrees at shoulder height. From here, maintaining those 90-degree bends, bring your forearms together in front of your face. Return back out to goal post position. Elbows should stay at shoulder height the entire time. Marland Kitchen Elbow Taps: With elbows at 90-degree bends, forearms held in front of face and palms facing you, tap your elbows together. Make sure you don't shrug your shoulders as you do this. If this feels weird on your wrists, do it with your palms facing each other.

## 2019-11-09 ENCOUNTER — Other Ambulatory Visit: Payer: Self-pay

## 2019-11-09 DIAGNOSIS — Z9889 Other specified postprocedural states: Secondary | ICD-10-CM

## 2019-11-10 MED ORDER — HYDROCODONE-ACETAMINOPHEN 5-325 MG PO TABS: 1.0000 | ORAL_TABLET | Freq: Four times a day (QID) | ORAL | 0 refills | Status: DC | PRN

## 2019-11-11 NOTE — Progress Notes (Signed)
Chief Complaint  Patient presents with  . Shoulder Pain    08/05/19 right shoulder     Encounter Diagnosis  Name Primary?  . S/P right rotator cuff repair open 09/19/18 repaired again 08/05/19 Yes     PRE-OPERATIVE DIAGNOSIS:  (Retear) torn rotator cuff right shoulder   POST-OPERATIVE DIAGNOSIS:  (Retear) torn rotator cuff right shoulder   PROCEDURE:  Procedure(s): OPEN ROTATOR CUFF REPAIR SHOULDER OPEN (Right) - 50037   SURGEON:  Surgeon(s) and Role:    Vickki Hearing, MD - Primary   FINDINGS: Rotator cuff was torn at the prior repair site with pull-through of the suture the suture anchor remained intact _________________________________________________________   Tina Pham is doing well she wants to return to light duty work which we discussed.  We advised her not to lift anything over her head as long as they can keep that under control and she does not have any new falls she should be fine  She has full range of motion of the shoulder her incision healed nicely.  She has some mild weakness against manual muscle testing but overall doing very well  Follow-up in 2 months

## 2019-11-12 ENCOUNTER — Ambulatory Visit (HOSPITAL_COMMUNITY): Payer: BLUE CROSS/BLUE SHIELD

## 2019-11-12 ENCOUNTER — Other Ambulatory Visit: Payer: Self-pay

## 2019-11-12 ENCOUNTER — Ambulatory Visit (INDEPENDENT_AMBULATORY_CARE_PROVIDER_SITE_OTHER): Payer: BLUE CROSS/BLUE SHIELD | Admitting: Orthopedic Surgery

## 2019-11-12 ENCOUNTER — Encounter: Payer: Self-pay | Admitting: Orthopedic Surgery

## 2019-11-12 ENCOUNTER — Encounter (HOSPITAL_COMMUNITY): Payer: Self-pay

## 2019-11-12 DIAGNOSIS — M25611 Stiffness of right shoulder, not elsewhere classified: Secondary | ICD-10-CM

## 2019-11-12 DIAGNOSIS — M25511 Pain in right shoulder: Secondary | ICD-10-CM

## 2019-11-12 DIAGNOSIS — Z9889 Other specified postprocedural states: Secondary | ICD-10-CM

## 2019-11-12 DIAGNOSIS — R29898 Other symptoms and signs involving the musculoskeletal system: Secondary | ICD-10-CM

## 2019-11-12 NOTE — Patient Instructions (Signed)
Home exercises continue  Follow-up in 2 months  Okay to return to light duty work today

## 2019-11-12 NOTE — Therapy (Signed)
[facility] [place], [place], [address] Phone: [phone]   Fax:  [phone]  Occupational Therapy Treatment  Patient Details  Name: Tina Pham MRN: [id number] Date of Birth: April 09, 1976 Referring Provider (OT): Arther Abbott, MD   Encounter Date: 11/12/2019  OT End of Session - 11/12/19 0939    Visit Number  21    Number of Visits  22    Authorization Type  [employer] other    Authorization Time Period  No authorization needed. PT/OT 30 visit limit. 28 remain 11/22/18-11/21/19    Authorization - Visit Number  23    Authorization - Number of Visits  28    OT Start Time  [phone]   reassessment and discharge   OT Stop Time  0940    OT Time Calculation (min)  30 min    Activity Tolerance  Patient tolerated treatment well    Behavior During Therapy  WFL for tasks assessed/performed       Past Medical History:  Diagnosis Date  . Anxiety   . Arthritis   . Chronic back pain   . Chronic bronchitis ([place])   . Depression   . Diabetes mellitus    lost weight and is not on meds now.  . High cholesterol   . History of kidney stones   . Hypertension   . Migraine   . Neuropathic pain of foot   . Plantar fasciitis   . Sciatica   . Torn rotator cuff     Past Surgical History:  Procedure Laterality Date  . BACK SURGERY     herniated disc; had disc removed and then fusion; total of 3 surgeries  . EYE SURGERY Bilateral    removal of cyst and straightening of muscles  . KNEE ARTHROSCOPY WITH MEDIAL MENISECTOMY Left 12/27/2016   Procedure: LEFT KNEE DIAGNOSTIC ARTHROSCOPY;  Surgeon: Carole Civil, MD;  Location: [facility];  Service: Orthopedics;  Laterality: Left;  . SHOULDER OPEN ROTATOR CUFF REPAIR Right 09/19/2018   Procedure: ROTATOR CUFF REPAIR SHOULDER OPEN;  Surgeon: Carole Civil, MD;  Location: [facility];  Service: Orthopedics;  Laterality: Right;  . SHOULDER OPEN ROTATOR CUFF REPAIR Right 08/05/2019   Procedure:  ROTATOR CUFF REPAIR SHOULDER OPEN;  Surgeon: Carole Civil, MD;  Location: [facility];  Service: Orthopedics;  Laterality: Right;  . TUBAL LIGATION      There were no vitals filed for this visit.  Subjective Assessment - 11/12/19 0921    Subjective   S: I'm ready to go back to work.    Currently in Pain?  Yes    Pain Score  2     Pain Location  Shoulder    Pain Orientation  Right    Pain Descriptors / Indicators  Sore    Pain Type  Acute pain         [facility] OT Assessment - 11/12/19 0910      Assessment   Medical Diagnosis  Right RTC repair      Precautions   Precautions  Shoulder    Type of Shoulder Precautions  Progress as tolerated      Observation/Other Assessments   Focus on Therapeutic Outcomes (FOTO)   70/100      ROM / Strength   AROM / PROM / Strength  AROM;Strength;PROM      AROM   Overall AROM Comments  Assessed standing. A/ROM not assessed prior to this session. IR/er adducted    AROM  Assessment Site  Shoulder    Right/Left Shoulder  Right    Right Shoulder Flexion  180 Degrees    Right Shoulder ABduction  180 Degrees    Right Shoulder Internal Rotation  90 Degrees    Right Shoulder External Rotation  90 Degrees      PROM   Overall PROM Comments  Patient has full P/ROM in the shoulder for all ranges.       Strength   Overall Strength Comments  Assessed standing. IR/er adducted.     Strength Assessment Site  Shoulder    Right/Left Shoulder  Right    Right Shoulder Flexion  5/5    Right Shoulder ABduction  5/5    Right Shoulder Internal Rotation  5/5    Right Shoulder External Rotation  5/5               OT Treatments/Exercises (OP) - 11/12/19 7048      Exercises   Exercises  Shoulder      Shoulder Exercises: ROM/Strengthening   Other ROM/Strengthening Exercises  Arms on Fire; 3 minutes total. 45 second each. Started with 1# hand weights and dropped to A/ROM for last minue.     Other ROM/Strengthening Exercises  15 minute arm circuit  with 1#; 20 on and 30 off; biceps, triceps, and shoulder ranges               OT Short Term Goals - 11/12/19 0941      OT SHORT TERM GOAL #1   Title  Patient will be educated and independent with HEP to faciliate progress in therapy and allow her to return to using her RUE as her dominant extremity for all daily and work activities.     Time  4    Period  Weeks    Target Date  09/17/19      OT SHORT TERM GOAL #2   Title  Patient will increase RUE P/ROM to WNL in order to complete upper body dressing tasks with less difficulty.    Time  4    Period  Weeks      OT SHORT TERM GOAL #3   Title  Pt will increase RUE strength to 3/5 in order to complete waist level tasks with less difficulty.    Time  4    Period  Weeks      OT SHORT TERM GOAL #4   Title  Patient will report a decrease in pain level of 3/10 when completing daily tasks with her RUE.    Time  4    Period  Weeks    Status  Achieved      OT SHORT TERM GOAL #5   Title  Patient will demonstrate fascial restrictions of trace amount in her RUE in order to increase functional mobility needed to complete reaching tasks.    Time  4    Period  Weeks        OT Long Term Goals - 11/12/19 0941      OT LONG TERM GOAL #1   Title  Patient will return to highest level of independence while returning to work using her RUE for 75% or more of daily tasks.    Time  8    Period  Weeks    Status  Achieved      OT LONG TERM GOAL #2   Title  Patient will increase A/ROM to WNL in order to increase ability to reach above shoulder level and into  cabinets with less difficulty.    Time  8    Period  Weeks      OT LONG TERM GOAL #3   Title  Patient will increase RUE strength to 4+/5 in order to lift normal houshold and work related items.    Time  8    Period  Weeks      OT LONG TERM GOAL #4   Title  Patient will report a decrease in pain of approximately 2/10 or less when using her RUE for daily tasks.    Time  8    Period   Weeks    Status  Achieved            Plan - 11/12/19 8185    Clinical Impression Statement  A: Reassessment completed this date. patient is presenting with full A/ROM and 5/5 strength in her RUE. She has shown an increase in shoulder endurance and is fatiguing less during sessions requiring fewer rest breaks. HEP was established to continue to focus on scapular and shoulder strength and stability. All education was complete. Patient is in agreement with discharge.    Body Structure / Function / Physical Skills  ADL;UE functional use;Fascial restriction;Pain;ROM;Scar mobility;Decreased knowledge of use of DME;Strength    Plan  P: D/C from OT services with HEP.    Consulted and Agree with Plan of Care  Patient       Patient will benefit from skilled therapeutic intervention in order to improve the following deficits and impairments:   Body Structure / Function / Physical Skills: ADL, UE functional use, Fascial restriction, Pain, ROM, Scar mobility, Decreased knowledge of use of DME, Strength       Visit Diagnosis: Stiffness of right shoulder, not elsewhere classified  Other symptoms and signs involving the musculoskeletal system  Acute pain of right shoulder    Problem List Patient Active Problem List   Diagnosis Date Noted  . S/P right rotator cuff repair open 09/19/18 repaired again 08/05/19 10/04/2018  . Complete tear of right rotator cuff   . Chronic pain of left knee   . Left shoulder pain 09/21/2015  . Disorders of bursae and tendons in shoulder region, unspecified 09/25/2013    OCCUPATIONAL THERAPY DISCHARGE SUMMARY  Visits from Start of Care: 21  Current functional level related to goals / functional outcomes: See above   Remaining deficits: See above   Education / Equipment: See above Plan: Patient agrees to discharge.  Patient goals were met. Patient is being discharged due to meeting the stated rehab goals.  ?????         Tina Pham,  OTR/L,CBIS  [id number]  11/12/2019, 9:44 AM  [facility] [address] [place], [place], [address] Phone: [phone]   Fax:  [phone]  Name: Tina Pham MRN: [id number] Date of Birth: [birth year]

## 2019-11-16 ENCOUNTER — Other Ambulatory Visit: Payer: Self-pay

## 2019-11-16 DIAGNOSIS — Z9889 Other specified postprocedural states: Secondary | ICD-10-CM

## 2019-11-17 MED ORDER — HYDROCODONE-ACETAMINOPHEN 5-325 MG PO TABS: 1.0000 | ORAL_TABLET | Freq: Four times a day (QID) | ORAL | 0 refills | Status: DC | PRN

## 2019-11-23 ENCOUNTER — Other Ambulatory Visit: Payer: Self-pay

## 2019-11-23 DIAGNOSIS — Z9889 Other specified postprocedural states: Secondary | ICD-10-CM

## 2019-11-24 MED ORDER — HYDROCODONE-ACETAMINOPHEN 5-325 MG PO TABS: 1.0000 | ORAL_TABLET | Freq: Four times a day (QID) | ORAL | 0 refills | Status: DC | PRN

## 2019-11-30 ENCOUNTER — Other Ambulatory Visit: Payer: Self-pay

## 2019-11-30 DIAGNOSIS — Z9889 Other specified postprocedural states: Secondary | ICD-10-CM

## 2019-12-01 MED ORDER — HYDROCODONE-ACETAMINOPHEN 5-325 MG PO TABS: 1.0000 | ORAL_TABLET | Freq: Four times a day (QID) | ORAL | 0 refills | Status: DC | PRN

## 2019-12-07 ENCOUNTER — Other Ambulatory Visit: Payer: Self-pay

## 2019-12-07 DIAGNOSIS — Z9889 Other specified postprocedural states: Secondary | ICD-10-CM

## 2019-12-08 MED ORDER — HYDROCODONE-ACETAMINOPHEN 5-325 MG PO TABS: 1.0000 | ORAL_TABLET | Freq: Four times a day (QID) | ORAL | 0 refills | Status: DC | PRN

## 2019-12-13 ENCOUNTER — Encounter (HOSPITAL_COMMUNITY): Payer: Self-pay | Admitting: Emergency Medicine

## 2019-12-13 ENCOUNTER — Emergency Department (HOSPITAL_COMMUNITY)
Admission: EM | Admit: 2019-12-13 | Discharge: 2019-12-13 | Disposition: A | Discharge status: Home / Self Care | Payer: BLUE CROSS/BLUE SHIELD | Attending: Emergency Medicine | Admitting: Emergency Medicine

## 2019-12-13 ENCOUNTER — Other Ambulatory Visit: Payer: Self-pay

## 2019-12-13 DIAGNOSIS — F17210 Nicotine dependence, cigarettes, uncomplicated: Secondary | ICD-10-CM | POA: Diagnosis not present

## 2019-12-13 DIAGNOSIS — I10 Essential (primary) hypertension: Secondary | ICD-10-CM | POA: Insufficient documentation

## 2019-12-13 DIAGNOSIS — L089 Local infection of the skin and subcutaneous tissue, unspecified: Secondary | ICD-10-CM

## 2019-12-13 DIAGNOSIS — E119 Type 2 diabetes mellitus without complications: Secondary | ICD-10-CM | POA: Diagnosis not present

## 2019-12-13 DIAGNOSIS — M542 Cervicalgia: Secondary | ICD-10-CM | POA: Diagnosis present

## 2019-12-13 MED ORDER — DOXYCYCLINE HYCLATE 100 MG PO CAPS: 100.0000 mg | ORAL_CAPSULE | Freq: Two times a day (BID) | ORAL | 0 refills | Status: DC

## 2019-12-13 MED ORDER — NAPROXEN 500 MG PO TABS: 500.0000 mg | ORAL_TABLET | Freq: Two times a day (BID) | ORAL | 0 refills | Status: DC | PRN

## 2019-12-13 NOTE — Discharge Instructions (Addendum)
You were seen in the emergency department today for an area of pain, redness and swelling.  We are concerned that you have an infection.  We are starting you on doxycycline, an antibiotic, to treat the infection.  Please be sure to take this with food as it can cause stomach upset.  We will send you home with naproxen to help with pain.Naproxen is a nonsteroidal anti-inflammatory medication that will help with pain and swelling. Be sure to take this medication as prescribed with food, 1 pill every 12 hours,  It should be taken with food, as it can cause stomach upset, and more seriously, stomach bleeding. Do not take other nonsteroidal anti-inflammatory medications with this such as Advil, Motrin, Aleve, Mobic, Goodie Powder, or Motrin.    You make take Tylenol per over the counter dosing with these medications.   We have prescribed you new medication(s) today. Discuss the medications prescribed today with your pharmacist as they can have adverse effects and interactions with your other medicines including over the counter and prescribed medications. Seek medical evaluation if you start to experience new or abnormal symptoms after taking one of these medicines, seek care immediately if you start to experience difficulty breathing, feeling of your throat closing, facial swelling, or rash as these could be indications of a more serious allergic reaction  Please follow-up with either primary care provider and/or dermatology within 3 days for reevaluation of the area.  Please see local options below.  You may also return to the ER for this.  Return to the ER sooner for new or worsening symptoms including but not limited to increased pain, spreading redness, fever, inability to move your neck, or any other concerns.  [facility] Primary Care Doctor List    Kari Baars MD. Specialty: Pulmonary Disease Contact information: [address]  [address]  [place] [address]  [phone]   Syliva Overman, MD. Specialty: [facility] Medicine Contact information: [address]  [place] [address]  [phone]   Lilyan Punt, MD. Specialty: [facility] Medicine Contact information: [address]  [place] [address]  [phone]   Avon Gully, MD Specialty: Internal Medicine Contact information: [address] [place] [address]  [phone]   Catalina Pizza, MD. Specialty: Internal Medicine Contact information: [address]  [place] [address]  [phone]    [facility] (Dr. Selena Batten) Specialty: Family Medicine Contact information: [address]  [place] [address]  [phone]   John Giovanni, MD. Specialty: [facility] Medicine Contact information: [address]  [address]  [place] [address]  [phone]   Carylon Perches, MD. Specialty: Internal Medicine Contact information: [address]  [address]  [place] [address]  [phone]    [facility]  [address] [place], [place] [address] [phone]  Services [facility] offers a variety of basic health services.  Services include but are not limited to: Blood pressure checks  Heart rate checks  Blood sugar checks  Urine analysis  Rapid strep tests  Pregnancy tests.  Health education and referrals  People needing more complex services will be directed to a physician online. Using these virtual visits, doctors can evaluate and prescribe medicine and treatments. There will be no medication on-site, though [facility] will help patients fill their prescriptions at little to no cost.   For More information  please go to: [email or web address]   [facility] Dermatologists:   [facility]  3.2 [phone])  Dermatologist  [place] [address] [place]  [phone]   Dr. Michelene Gardener, MD  2.6 [phone])  Dermatologist  [place]  [phone]  [facility]  3.5 (3)  [facility]  [place]  [phone]   [place]  4.0 (4)  Dermatologist  [employer]  [phone]  Lavonna Monarch MD  3.0 (2)  Dermatologist  [place]  [phone]  Katrina Stack  2.7 (6)  Dermatologist  [place]  [phone]  [place] Amy Y MD  2.0 (1)  Dermatologist  [place]  [phone]  [place]  5.0 (3)  Doctor  [address]  [phone]

## 2019-12-13 NOTE — ED Triage Notes (Signed)
Patient c/o abscess to right, posterior neck. Per patient started as what appeared to be a pimple in March. Patient states "On Tuesday when I scratched the back of her neck it felt like something popped under the surface." Denies any drainage, denies any fevers. Per patient increased swelling and redness since. Patient using warm compresses with no relief. Area warm to touch.

## 2019-12-13 NOTE — ED Provider Notes (Signed)
[facility] EMERGENCY DEPARTMENT Provider Note   CSN: [id number] Arrival date & time: 12/13/19  1147     History Chief Complaint  Patient presents with  . Abscess    Tina Pham is a 44 y.o. female with a history of diabetes mellitus, hypercholesterolemia, and hypertension who presents to the emergency department with concern for painful mass to the right side of her neck.  Patient states that a couple of months ago she noted a pimple-like area to the right posterior lateral base of the neck, had not changed in size much, however 4 to 5 days prior she accidentally itch/scratch at the area and felt like something popped under the surface.  She states since then she has had progressively worsening pain, redness, and swelling to the area.  No alleviating factors.  Denies fever, chills, vomiting, chest pain, or dyspnea.  Denies other areas of wound/rashes.  Denies pregnancy.  HPI     Past Medical History:  Diagnosis Date  . Anxiety   . Arthritis   . Chronic back pain   . Chronic bronchitis (HCC)   . Depression   . Diabetes mellitus    lost weight and is not on meds now.  . High cholesterol   . History of kidney stones   . Hypertension   . Migraine   . Neuropathic pain of foot   . Plantar fasciitis   . Sciatica   . Torn rotator cuff     Patient Active Problem List   Diagnosis Date Noted  . S/P right rotator cuff repair open 09/19/18 repaired again 08/05/19 10/04/2018  . Complete tear of right rotator cuff   . Chronic pain of left knee   . Left shoulder pain 09/21/2015  . Disorders of bursae and tendons in shoulder region, unspecified 09/25/2013    Past Surgical History:  Procedure Laterality Date  . BACK SURGERY     herniated disc; had disc removed and then fusion; total of 3 surgeries  . EYE SURGERY Bilateral    removal of cyst and straightening of muscles  . KNEE ARTHROSCOPY WITH MEDIAL MENISECTOMY Left 12/27/2016   Procedure: LEFT KNEE DIAGNOSTIC ARTHROSCOPY;   Surgeon: Vickki Hearing, MD;  Location: [facility];  Service: Orthopedics;  Laterality: Left;  . SHOULDER OPEN ROTATOR CUFF REPAIR Right 09/19/2018   Procedure: ROTATOR CUFF REPAIR SHOULDER OPEN;  Surgeon: Vickki Hearing, MD;  Location: [facility];  Service: Orthopedics;  Laterality: Right;  . SHOULDER OPEN ROTATOR CUFF REPAIR Right 08/05/2019   Procedure: ROTATOR CUFF REPAIR SHOULDER OPEN;  Surgeon: Vickki Hearing, MD;  Location: [facility];  Service: Orthopedics;  Laterality: Right;  . TUBAL LIGATION       OB History    Gravida  3   Para  3   Term  3   Preterm      AB      Living  3     SAB      TAB      Ectopic      Multiple      Live Births              Family History  Problem Relation Age of Onset  . Heart failure Mother   . COPD Mother   . Diabetes Mother   . Heart failure Father   . Diabetes Other     Social History   Tobacco Use  . Smoking status: Current Every Day Smoker    Packs/day: 1.00  Years: 20.00    Pack years: 20.00    Types: Cigarettes  . Smokeless tobacco: Never Used  Substance Use Topics  . Alcohol use: No  . Drug use: No    Home Medications Prior to Admission medications   Medication Sig Start Date End Date Taking? Authorizing Provider  HYDROcodone-acetaminophen (NORCO/VICODIN) 5-325 MG tablet Take 1 tablet by mouth every 6 (six) hours as needed for moderate pain. 12/08/19   Carole Civil, MD  ibuprofen (ADVIL) 800 MG tablet Take 1 tablet (800 mg total) by mouth 3 (three) times daily. 11/03/19   Carole Civil, MD  tiZANidine (ZANAFLEX) 4 MG tablet Take 1 tablet (4 mg total) by mouth 3 (three) times daily. 10/15/19 10/14/20  Carole Civil, MD    Allergies    Codeine, Benadryl [diphenhydramine hcl], and Compazine  Review of Systems   Review of Systems  Constitutional: Negative for chills and fever.  HENT: Negative for congestion, ear pain and sore throat.   Respiratory: Negative for shortness of breath.     Cardiovascular: Negative for chest pain.  Gastrointestinal: Negative for abdominal pain, nausea and vomiting.  Skin: Positive for color change and wound.  Neurological: Negative for syncope.    Physical Exam Updated Vital Signs BP (!) 152/94 (BP Location: Right Arm)   Pulse 88   Temp 98.4 F (36.9 C)   Resp 14   Ht 5\' 8"  (1.727 m)   Wt 108.9 kg   LMP 12/08/2019   SpO2 100%   BMI 36.49 kg/m   Physical Exam Vitals and nursing note reviewed.  Constitutional:      General: She is not in acute distress.    Appearance: She is well-developed. She is not toxic-appearing.  HENT:     Head: Normocephalic and atraumatic.  Eyes:     General:        Right eye: No discharge.        Left eye: No discharge.     Conjunctiva/sclera: Conjunctivae normal.  Neck:   Cardiovascular:     Rate and Rhythm: Normal rate and regular rhythm.  Pulmonary:     Effort: Pulmonary effort is normal. No respiratory distress.     Breath sounds: Normal breath sounds. No wheezing, rhonchi or rales.  Abdominal:     General: There is no distension.     Palpations: Abdomen is soft.     Tenderness: There is no abdominal tenderness.  Musculoskeletal:     Cervical back: Neck supple. No rigidity.  Skin:    General: Skin is warm and dry.     Findings: No rash.  Neurological:     Mental Status: She is alert.     Comments: Clear speech.   Psychiatric:        Behavior: Behavior normal.     ED Results / Procedures / Treatments   Labs (all labs ordered are listed, but only abnormal results are displayed) Labs Reviewed - No data to display  EKG None  Radiology No results found.  Procedures Procedures (including critical care time)  Medications Ordered in ED Medications - No data to display  ED Course  I have reviewed the triage vital signs and the nursing notes.  Pertinent labs & imaging results that were available during my care of the patient were reviewed by me and considered in my medical  decision making (see chart for details).    MDM Rules/Calculators/A&P  Patient presents to the ED with painful erythematous area of swelling to the R posterior base of the neck.  Patient is nontoxic, resting comfortably, vitals WNL with exception of elevated blood pressure, low suspicion for hypertensive emergency.  DDx: abscess, infected cyst, cellulitis. Bedside US without obvious fluid collection amenable to emergency department incision and drainage.  Will start on doxycycline and provide naproxen.with pain/swelling.  Also provide dermatology follow-up. I discussed results, treatment plan, need for follow-up, and return precautions with the patient. Provided opportunity for questions, patient confirmed understanding and is in agreement with plan.    Final Clinical Impression(s) / ED Diagnoses Final diagnoses:  Skin infection    Rx / DC Orders ED Discharge Orders         Ordered    doxycycline (VIBRAMYCIN) 100 MG capsule  2 times daily     12/13/19 1341    naproxen (NAPROSYN) 500 MG tablet  2 times daily PRN     12/13/19 1341           Derius Ghosh, Pleas Koch, PA-C 12/13/19 1343    Vanetta Mulders, MD 01/13/20 2233

## 2019-12-13 NOTE — ED Notes (Signed)
Red raised area to right posterior neck, rates pain 6/10.

## 2019-12-14 ENCOUNTER — Other Ambulatory Visit: Payer: Self-pay | Admitting: Orthopedic Surgery

## 2019-12-14 ENCOUNTER — Other Ambulatory Visit: Payer: Self-pay

## 2019-12-14 DIAGNOSIS — Z9889 Other specified postprocedural states: Secondary | ICD-10-CM

## 2019-12-16 ENCOUNTER — Other Ambulatory Visit: Payer: Self-pay | Admitting: Orthopedic Surgery

## 2019-12-16 ENCOUNTER — Telehealth: Payer: Self-pay | Admitting: Orthopedic Surgery

## 2019-12-16 DIAGNOSIS — Z9889 Other specified postprocedural states: Secondary | ICD-10-CM

## 2019-12-16 MED ORDER — HYDROCODONE-ACETAMINOPHEN 5-325 MG PO TABS: 1.0000 | ORAL_TABLET | Freq: Four times a day (QID) | ORAL | 0 refills | Status: DC | PRN

## 2019-12-16 NOTE — Telephone Encounter (Signed)
Tina Pham called and stated that she was told by [facility] that they could not fill her prescription for Hydrocodone/Acetaminophen 5-325 mgs.  It appears they are having some problem with getting this delivered to their pharmacy.  Tina Pham asks that this prescription be canceled at [facility] and sent to [facility] here in [place]  Thanks

## 2019-12-16 NOTE — Telephone Encounter (Signed)
Rx request 

## 2019-12-21 ENCOUNTER — Other Ambulatory Visit: Payer: Self-pay

## 2019-12-21 DIAGNOSIS — Z9889 Other specified postprocedural states: Secondary | ICD-10-CM

## 2019-12-23 MED ORDER — HYDROCODONE-ACETAMINOPHEN 5-325 MG PO TABS: 1.0000 | ORAL_TABLET | Freq: Four times a day (QID) | ORAL | 0 refills | Status: DC | PRN

## 2019-12-30 ENCOUNTER — Other Ambulatory Visit: Payer: Self-pay

## 2019-12-30 DIAGNOSIS — Z9889 Other specified postprocedural states: Secondary | ICD-10-CM

## 2019-12-30 MED ORDER — HYDROCODONE-ACETAMINOPHEN 5-325 MG PO TABS: 1.0000 | ORAL_TABLET | Freq: Four times a day (QID) | ORAL | 0 refills | Status: DC | PRN

## 2020-01-06 ENCOUNTER — Other Ambulatory Visit: Payer: Self-pay

## 2020-01-06 DIAGNOSIS — Z9889 Other specified postprocedural states: Secondary | ICD-10-CM

## 2020-01-06 MED ORDER — HYDROCODONE-ACETAMINOPHEN 5-325 MG PO TABS: 1.0000 | ORAL_TABLET | Freq: Four times a day (QID) | ORAL | 0 refills | Status: DC | PRN

## 2020-01-12 ENCOUNTER — Encounter: Payer: Self-pay | Admitting: Orthopedic Surgery

## 2020-01-12 ENCOUNTER — Other Ambulatory Visit: Payer: Self-pay

## 2020-01-12 ENCOUNTER — Ambulatory Visit (INDEPENDENT_AMBULATORY_CARE_PROVIDER_SITE_OTHER): Payer: BLUE CROSS/BLUE SHIELD | Admitting: Orthopedic Surgery

## 2020-01-12 VITALS — BP 123/88 | HR 83 | Ht 68.0 in | Wt 230.0 lb

## 2020-01-12 DIAGNOSIS — Z9889 Other specified postprocedural states: Secondary | ICD-10-CM

## 2020-01-12 DIAGNOSIS — M4722 Other spondylosis with radiculopathy, cervical region: Secondary | ICD-10-CM

## 2020-01-12 DIAGNOSIS — M542 Cervicalgia: Secondary | ICD-10-CM

## 2020-01-12 MED ORDER — HYDROCODONE-ACETAMINOPHEN 5-325 MG PO TABS: 1.0000 | ORAL_TABLET | Freq: Four times a day (QID) | ORAL | 0 refills | Status: DC | PRN
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
## 2020-01-18 ENCOUNTER — Other Ambulatory Visit: Payer: Self-pay

## 2020-01-18 DIAGNOSIS — Z9889 Other specified postprocedural states: Secondary | ICD-10-CM

## 2020-01-19 ENCOUNTER — Other Ambulatory Visit: Payer: Self-pay | Admitting: Orthopedic Surgery

## 2020-01-19 DIAGNOSIS — M542 Cervicalgia: Secondary | ICD-10-CM

## 2020-01-19 MED ORDER — HYDROCODONE-ACETAMINOPHEN 5-325 MG PO TABS: 1.0000 | ORAL_TABLET | Freq: Three times a day (TID) | ORAL | 0 refills | Status: AC | PRN

## 2020-01-19 MED ORDER — NAPROXEN 500 MG PO TABS: 500.0000 mg | ORAL_TABLET | Freq: Two times a day (BID) | ORAL | 0 refills | Status: DC | PRN

## 2020-01-26 ENCOUNTER — Other Ambulatory Visit: Payer: Self-pay

## 2020-01-26 DIAGNOSIS — Z9889 Other specified postprocedural states: Secondary | ICD-10-CM

## 2020-01-27 MED ORDER — HYDROCODONE-ACETAMINOPHEN 5-325 MG PO TABS: 1.0000 | ORAL_TABLET | Freq: Four times a day (QID) | ORAL | 0 refills | Status: DC | PRN

## 2020-02-01 ENCOUNTER — Other Ambulatory Visit: Payer: Self-pay

## 2020-02-01 DIAGNOSIS — Z9889 Other specified postprocedural states: Secondary | ICD-10-CM

## 2020-02-02 MED ORDER — HYDROCODONE-ACETAMINOPHEN 5-325 MG PO TABS: 1.0000 | ORAL_TABLET | Freq: Four times a day (QID) | ORAL | 0 refills | Status: DC | PRN

## 2020-02-08 ENCOUNTER — Other Ambulatory Visit: Payer: Self-pay

## 2020-02-08 DIAGNOSIS — Z9889 Other specified postprocedural states: Secondary | ICD-10-CM

## 2020-02-09 ENCOUNTER — Other Ambulatory Visit: Payer: Self-pay | Admitting: Orthopedic Surgery

## 2020-02-09 DIAGNOSIS — Z9889 Other specified postprocedural states: Secondary | ICD-10-CM

## 2020-02-09 MED ORDER — HYDROCODONE-ACETAMINOPHEN 5-325 MG PO TABS: 1.0000 | ORAL_TABLET | Freq: Three times a day (TID) | ORAL | 0 refills | Status: DC | PRN

## 2020-02-13 ENCOUNTER — Ambulatory Visit (HOSPITAL_COMMUNITY)
Admission: RE | Admit: 2020-02-13 | Discharge: 2020-02-13 | Disposition: A | Discharge status: Home / Self Care | Payer: BLUE CROSS/BLUE SHIELD | Source: Ambulatory Visit | Attending: Orthopedic Surgery | Admitting: Orthopedic Surgery

## 2020-02-13 ENCOUNTER — Other Ambulatory Visit: Payer: Self-pay

## 2020-02-13 DIAGNOSIS — M542 Cervicalgia: Secondary | ICD-10-CM | POA: Diagnosis not present

## 2020-02-13 DIAGNOSIS — M4722 Other spondylosis with radiculopathy, cervical region: Secondary | ICD-10-CM | POA: Insufficient documentation

## 2020-02-15 ENCOUNTER — Other Ambulatory Visit: Payer: Self-pay

## 2020-02-15 ENCOUNTER — Other Ambulatory Visit: Payer: Self-pay | Admitting: Orthopedic Surgery

## 2020-02-15 DIAGNOSIS — M542 Cervicalgia: Secondary | ICD-10-CM

## 2020-02-15 DIAGNOSIS — Z9889 Other specified postprocedural states: Secondary | ICD-10-CM

## 2020-02-16 MED ORDER — HYDROCODONE-ACETAMINOPHEN 5-325 MG PO TABS: 1.0000 | ORAL_TABLET | Freq: Three times a day (TID) | ORAL | 0 refills | Status: DC | PRN

## 2020-02-16 MED ORDER — NAPROXEN 500 MG PO TABS: 500.0000 mg | ORAL_TABLET | Freq: Two times a day (BID) | ORAL | 0 refills | Status: DC | PRN

## 2020-02-22 ENCOUNTER — Other Ambulatory Visit: Payer: Self-pay

## 2020-02-22 DIAGNOSIS — Z9889 Other specified postprocedural states: Secondary | ICD-10-CM

## 2020-02-23 MED ORDER — HYDROCODONE-ACETAMINOPHEN 5-325 MG PO TABS: 1.0000 | ORAL_TABLET | Freq: Three times a day (TID) | ORAL | 0 refills | Status: DC | PRN

## 2020-02-29 ENCOUNTER — Other Ambulatory Visit: Payer: Self-pay

## 2020-02-29 DIAGNOSIS — Z9889 Other specified postprocedural states: Secondary | ICD-10-CM

## 2020-03-01 MED ORDER — HYDROCODONE-ACETAMINOPHEN 5-325 MG PO TABS: 1.0000 | ORAL_TABLET | Freq: Three times a day (TID) | ORAL | 0 refills | Status: DC | PRN

## 2020-03-07 ENCOUNTER — Other Ambulatory Visit: Payer: Self-pay

## 2020-03-07 DIAGNOSIS — Z9889 Other specified postprocedural states: Secondary | ICD-10-CM

## 2020-03-08 MED ORDER — HYDROCODONE-ACETAMINOPHEN 5-325 MG PO TABS: 1.0000 | ORAL_TABLET | Freq: Three times a day (TID) | ORAL | 0 refills | Status: DC | PRN

## 2020-03-09 NOTE — Progress Notes (Signed)
FOLLOW UP VISIT : MRI RESULTS   Chief Complaint  Patient presents with  . Neck Pain    feels worse/ like "crick" in neck      HPI: The patient is here TO DISCUSS THE RESULTS OF an MRI  44 year old female had recent right ear cuff revision surgery did well however radicular symptoms in the right upper extremity prompted an MRI of the cervical spine  To evaluate complaints of  RIGHT ARM PAIN RAD TO HAND ASSOC W NUMBNESS AND LOSS OF CONTROL OF THE ARM    Review of Systems  Neurological: Positive for tingling, sensory change and focal weakness.     BP (!) 148/85   Pulse 97   Ht 5\' 8"  (1.727 m)   Wt 230 lb (104.3 kg)   BMI 34.97 kg/m     Medical decision-making section   DATA  MRI REPORT:  IMPRESSION: 1. Medial right foraminal disc osteophyte complex at C5-6 causes at least moderate right foraminal narrowing and probable right C6 nerve root encroachment. 2. Spondylosis at C6-7 without spinal stenosis or nerve root encroachment. 3. Mild foraminal narrowing bilaterally at C3-4 and on the right at C4-5. 4. No cord deformity or abnormal cord signal.     Electronically Signed   By: M.D.   On: 02/15/2020 13:16   Meds ordered this encounter  Medications  . gabapentin (NEURONTIN) 300 MG capsule    Sig: Take 1 capsule (300 mg total) by mouth 3 (three) times daily.    Dispense:  90 capsule    Refill:  5       MY READING: MRI OF THE  DEGENERATION OF THE DISCS OF THE C SPINE WITH SPINAL STENOSIS and foraminal disc complex at C5-6 with C6 nerve impingement  MRI findings explained  Recommend referral also recommend adding medication to the opioid   Encounter Diagnoses  Name Primary?  . Neck pain   . Cervical disc disorder with radiculopathy, unspecified cervical region Yes      PLAN:  REFERRAL AND ADD GABAPENTIN BECAUSE NORCO NOT WORKING HAD TO TAKE 2

## 2020-03-10 ENCOUNTER — Encounter: Payer: Self-pay | Admitting: Orthopedic Surgery

## 2020-03-10 ENCOUNTER — Other Ambulatory Visit: Payer: Self-pay

## 2020-03-10 ENCOUNTER — Ambulatory Visit (INDEPENDENT_AMBULATORY_CARE_PROVIDER_SITE_OTHER): Payer: BLUE CROSS/BLUE SHIELD | Admitting: Orthopedic Surgery

## 2020-03-10 VITALS — BP 148/85 | HR 97 | Ht 68.0 in | Wt 230.0 lb

## 2020-03-10 DIAGNOSIS — M542 Cervicalgia: Secondary | ICD-10-CM | POA: Diagnosis not present

## 2020-03-10 DIAGNOSIS — M5010 Cervical disc disorder with radiculopathy, unspecified cervical region: Secondary | ICD-10-CM | POA: Diagnosis not present

## 2020-03-10 MED ORDER — GABAPENTIN 300 MG PO CAPS: 300.0000 mg | ORAL_CAPSULE | Freq: Three times a day (TID) | ORAL | 5 refills | Status: DC

## 2020-03-14 ENCOUNTER — Other Ambulatory Visit: Payer: Self-pay

## 2020-03-14 DIAGNOSIS — Z9889 Other specified postprocedural states: Secondary | ICD-10-CM

## 2020-03-15 MED ORDER — HYDROCODONE-ACETAMINOPHEN 5-325 MG PO TABS: 1.0000 | ORAL_TABLET | Freq: Three times a day (TID) | ORAL | 0 refills | Status: DC | PRN

## 2020-03-21 ENCOUNTER — Other Ambulatory Visit: Payer: Self-pay

## 2020-03-21 DIAGNOSIS — Z9889 Other specified postprocedural states: Secondary | ICD-10-CM

## 2020-03-22 ENCOUNTER — Other Ambulatory Visit: Payer: Self-pay | Admitting: Orthopedic Surgery

## 2020-03-22 MED ORDER — HYDROCODONE-ACETAMINOPHEN 5-325 MG PO TABS: 1.0000 | ORAL_TABLET | Freq: Three times a day (TID) | ORAL | 0 refills | Status: DC | PRN

## 2020-03-28 ENCOUNTER — Other Ambulatory Visit: Payer: Self-pay

## 2020-03-28 DIAGNOSIS — Z9889 Other specified postprocedural states: Secondary | ICD-10-CM

## 2020-03-30 MED ORDER — HYDROCODONE-ACETAMINOPHEN 5-325 MG PO TABS: 1.0000 | ORAL_TABLET | Freq: Three times a day (TID) | ORAL | 0 refills | Status: DC | PRN

## 2020-04-05 ENCOUNTER — Other Ambulatory Visit: Payer: Self-pay

## 2020-04-05 DIAGNOSIS — Z9889 Other specified postprocedural states: Secondary | ICD-10-CM

## 2020-04-05 MED ORDER — HYDROCODONE-ACETAMINOPHEN 5-325 MG PO TABS: 1.0000 | ORAL_TABLET | Freq: Three times a day (TID) | ORAL | 0 refills | Status: DC | PRN

## 2020-04-11 ENCOUNTER — Other Ambulatory Visit: Payer: Self-pay

## 2020-04-11 DIAGNOSIS — Z9889 Other specified postprocedural states: Secondary | ICD-10-CM

## 2020-04-12 ENCOUNTER — Other Ambulatory Visit: Payer: Self-pay | Admitting: Orthopedic Surgery

## 2020-04-12 DIAGNOSIS — M542 Cervicalgia: Secondary | ICD-10-CM

## 2020-04-12 MED ORDER — HYDROCODONE-ACETAMINOPHEN 5-325 MG PO TABS: 1.0000 | ORAL_TABLET | Freq: Three times a day (TID) | ORAL | 0 refills | Status: DC | PRN

## 2020-04-12 NOTE — Telephone Encounter (Signed)
Rx request 

## 2020-04-19 ENCOUNTER — Other Ambulatory Visit: Payer: Self-pay

## 2020-04-19 DIAGNOSIS — Z9889 Other specified postprocedural states: Secondary | ICD-10-CM

## 2020-04-19 MED ORDER — HYDROCODONE-ACETAMINOPHEN 5-325 MG PO TABS: 1.0000 | ORAL_TABLET | Freq: Three times a day (TID) | ORAL | 0 refills | Status: DC | PRN

## 2020-04-26 ENCOUNTER — Other Ambulatory Visit: Payer: Self-pay

## 2020-04-26 DIAGNOSIS — Z9889 Other specified postprocedural states: Secondary | ICD-10-CM

## 2020-04-27 MED ORDER — HYDROCODONE-ACETAMINOPHEN 5-325 MG PO TABS: 1.0000 | ORAL_TABLET | Freq: Three times a day (TID) | ORAL | 0 refills | Status: DC | PRN

## 2020-05-02 ENCOUNTER — Other Ambulatory Visit: Payer: Self-pay

## 2020-05-02 DIAGNOSIS — Z9889 Other specified postprocedural states: Secondary | ICD-10-CM

## 2020-05-03 MED ORDER — HYDROCODONE-ACETAMINOPHEN 5-325 MG PO TABS: 1.0000 | ORAL_TABLET | Freq: Three times a day (TID) | ORAL | 0 refills | Status: DC | PRN

## 2020-05-10 ENCOUNTER — Other Ambulatory Visit: Payer: Self-pay

## 2020-05-10 DIAGNOSIS — Z9889 Other specified postprocedural states: Secondary | ICD-10-CM

## 2020-05-10 MED ORDER — HYDROCODONE-ACETAMINOPHEN 5-325 MG PO TABS: 1.0000 | ORAL_TABLET | Freq: Three times a day (TID) | ORAL | 0 refills | Status: DC | PRN

## 2020-05-17 ENCOUNTER — Other Ambulatory Visit: Payer: Self-pay

## 2020-05-17 DIAGNOSIS — Z9889 Other specified postprocedural states: Secondary | ICD-10-CM

## 2020-05-17 MED ORDER — HYDROCODONE-ACETAMINOPHEN 5-325 MG PO TABS: 1.0000 | ORAL_TABLET | Freq: Three times a day (TID) | ORAL | 0 refills | Status: DC | PRN

## 2020-05-18 ENCOUNTER — Emergency Department (HOSPITAL_COMMUNITY)
Admission: EM | Admit: 2020-05-18 | Discharge: 2020-05-18 | Disposition: A | Discharge status: Home / Self Care | Payer: BLUE CROSS/BLUE SHIELD | Attending: Emergency Medicine | Admitting: Emergency Medicine

## 2020-05-18 ENCOUNTER — Encounter (HOSPITAL_COMMUNITY): Payer: Self-pay | Admitting: Emergency Medicine

## 2020-05-18 ENCOUNTER — Other Ambulatory Visit: Payer: Self-pay

## 2020-05-18 DIAGNOSIS — S91102A Unspecified open wound of left great toe without damage to nail, initial encounter: Secondary | ICD-10-CM | POA: Insufficient documentation

## 2020-05-18 DIAGNOSIS — Z5321 Procedure and treatment not carried out due to patient leaving prior to being seen by health care provider: Secondary | ICD-10-CM | POA: Insufficient documentation

## 2020-05-18 DIAGNOSIS — X58XXXA Exposure to other specified factors, initial encounter: Secondary | ICD-10-CM | POA: Diagnosis not present

## 2020-05-18 DIAGNOSIS — S99922A Unspecified injury of left foot, initial encounter: Secondary | ICD-10-CM | POA: Diagnosis present

## 2020-05-18 NOTE — ED Triage Notes (Signed)
Patient has poor circulation to left foot, greater toe, purple in color, cool to touch.

## 2020-05-24 ENCOUNTER — Other Ambulatory Visit: Payer: Self-pay

## 2020-05-24 DIAGNOSIS — Z9889 Other specified postprocedural states: Secondary | ICD-10-CM

## 2020-05-24 MED ORDER — HYDROCODONE-ACETAMINOPHEN 5-325 MG PO TABS: 1.0000 | ORAL_TABLET | Freq: Three times a day (TID) | ORAL | 0 refills | Status: DC | PRN

## 2020-05-31 ENCOUNTER — Other Ambulatory Visit: Payer: Self-pay

## 2020-05-31 DIAGNOSIS — Z9889 Other specified postprocedural states: Secondary | ICD-10-CM

## 2020-05-31 MED ORDER — HYDROCODONE-ACETAMINOPHEN 5-325 MG PO TABS: 1.0000 | ORAL_TABLET | Freq: Three times a day (TID) | ORAL | 0 refills | Status: DC | PRN

## 2020-06-06 ENCOUNTER — Other Ambulatory Visit: Payer: Self-pay

## 2020-06-06 DIAGNOSIS — Z9889 Other specified postprocedural states: Secondary | ICD-10-CM

## 2020-06-07 MED ORDER — HYDROCODONE-ACETAMINOPHEN 5-325 MG PO TABS: 1.0000 | ORAL_TABLET | Freq: Three times a day (TID) | ORAL | 0 refills | Status: DC | PRN

## 2020-06-13 ENCOUNTER — Other Ambulatory Visit: Payer: Self-pay

## 2020-06-13 DIAGNOSIS — Z9889 Other specified postprocedural states: Secondary | ICD-10-CM

## 2020-06-15 ENCOUNTER — Emergency Department (HOSPITAL_COMMUNITY): Payer: BLUE CROSS/BLUE SHIELD

## 2020-06-15 ENCOUNTER — Other Ambulatory Visit: Payer: Self-pay

## 2020-06-15 ENCOUNTER — Emergency Department (HOSPITAL_COMMUNITY)
Admission: EM | Admit: 2020-06-15 | Discharge: 2020-06-15 | Disposition: A | Discharge status: Home / Self Care | Payer: BLUE CROSS/BLUE SHIELD | Attending: Emergency Medicine | Admitting: Emergency Medicine

## 2020-06-15 ENCOUNTER — Encounter (HOSPITAL_COMMUNITY): Payer: Self-pay | Admitting: *Deleted

## 2020-06-15 DIAGNOSIS — I96 Gangrene, not elsewhere classified: Secondary | ICD-10-CM

## 2020-06-15 DIAGNOSIS — F17210 Nicotine dependence, cigarettes, uncomplicated: Secondary | ICD-10-CM | POA: Diagnosis not present

## 2020-06-15 DIAGNOSIS — I10 Essential (primary) hypertension: Secondary | ICD-10-CM | POA: Insufficient documentation

## 2020-06-15 DIAGNOSIS — Z20822 Contact with and (suspected) exposure to covid-19: Secondary | ICD-10-CM | POA: Insufficient documentation

## 2020-06-15 DIAGNOSIS — E119 Type 2 diabetes mellitus without complications: Secondary | ICD-10-CM | POA: Diagnosis not present

## 2020-06-15 DIAGNOSIS — M79675 Pain in left toe(s): Secondary | ICD-10-CM | POA: Diagnosis present

## 2020-06-15 LAB — COMPREHENSIVE METABOLIC PANEL
ALT: 10 U/L (ref 0–44)
AST: 11 U/L — ABNORMAL LOW (ref 15–41)
Albumin: 3.7 g/dL (ref 3.5–5.0)
Alkaline Phosphatase: 55 U/L (ref 38–126)
Anion gap: 8 (ref 5–15)
BUN: 9 mg/dL (ref 6–20)
CO2: 25 mmol/L (ref 22–32)
Calcium: 9.2 mg/dL (ref 8.9–10.3)
Chloride: 103 mmol/L (ref 98–111)
Creatinine, Ser: 0.48 mg/dL (ref 0.44–1.00)
GFR, Estimated: >60 mL/min (ref >60)
Glucose, Bld: 289 mg/dL — ABNORMAL HIGH (ref 70–99)
Potassium: 3.7 mmol/L (ref 3.5–5.1)
Sodium: 136 mmol/L (ref 135–145)
Total Bilirubin: 0.2 mg/dL — ABNORMAL LOW (ref 0.3–1.2)
Total Protein: 7.1 g/dL (ref 6.5–8.1)

## 2020-06-15 LAB — CBC WITH DIFFERENTIAL/PLATELET
Abs Immature Granulocytes: 0.02 10*3/uL (ref 0.00–0.07)
Basophils Absolute: 0 10*3/uL (ref 0.0–0.1)
Basophils Relative: 1 %
Eosinophils Absolute: 0.1 10*3/uL (ref 0.0–0.5)
Eosinophils Relative: 2 %
HCT: 34.8 % — ABNORMAL LOW (ref 36.0–46.0)
Hemoglobin: 9.5 g/dL — ABNORMAL LOW (ref 12.0–15.0)
Immature Granulocytes: 0 %
Lymphocytes Relative: 31 %
Lymphs Abs: 2.2 10*3/uL (ref 0.7–4.0)
MCH: 20 pg — ABNORMAL LOW (ref 26.0–34.0)
MCHC: 27.3 g/dL — ABNORMAL LOW (ref 30.0–36.0)
MCV: 73.4 fL — ABNORMAL LOW (ref 80.0–100.0)
Monocytes Absolute: 0.4 10*3/uL (ref 0.1–1.0)
Monocytes Relative: 5 %
Neutro Abs: 4.5 10*3/uL (ref 1.7–7.7)
Neutrophils Relative %: 61 %
Platelets: 439 10*3/uL — ABNORMAL HIGH (ref 150–400)
RBC: 4.74 MIL/uL (ref 3.87–5.11)
RDW: 17.6 % — ABNORMAL HIGH (ref 11.5–15.5)
WBC: 7.3 10*3/uL (ref 4.0–10.5)
nRBC: 0 % (ref 0.0–0.2)

## 2020-06-15 LAB — RESP PANEL BY RT-PCR (FLU A&B, COVID) ARPGX2
Influenza A by PCR: NEGATIVE
Influenza B by PCR: NEGATIVE
SARS Coronavirus 2 by RT PCR: NEGATIVE

## 2020-06-15 LAB — CBG MONITORING, ED: Glucose-Capillary: 308 mg/dL — ABNORMAL HIGH (ref 70–99)

## 2020-06-15 MED ORDER — HYDROCODONE-ACETAMINOPHEN 5-325 MG PO TABS: 1.0000 | ORAL_TABLET | ORAL | 0 refills | Status: DC | PRN

## 2020-06-15 MED ORDER — METFORMIN HCL 500 MG PO TABS: 500.0000 mg | ORAL_TABLET | Freq: Two times a day (BID) | ORAL | 1 refills | Status: DC

## 2020-06-15 MED ORDER — DOXYCYCLINE HYCLATE 100 MG PO TABS: 100.0000 mg | ORAL_TABLET | Freq: Two times a day (BID) | ORAL | 0 refills | Status: DC

## 2020-06-15 MED ORDER — HYDROCODONE-ACETAMINOPHEN 5-325 MG PO TABS: 1.0000 | ORAL_TABLET | Freq: Three times a day (TID) | ORAL | 0 refills | Status: DC | PRN

## 2020-06-15 MED ORDER — GLIPIZIDE 5 MG PO TABS: 5.0000 mg | ORAL_TABLET | Freq: Two times a day (BID) | ORAL | 11 refills | Status: DC

## 2020-06-15 NOTE — Discharge Instructions (Signed)
The vascular surgeons office will call you to schedule an appointment

## 2020-06-15 NOTE — ED Triage Notes (Signed)
Pt with left great toe that is turning black in color per pt for past 2 weeks.  Pt with hx of DM. Seen for same on 10/26 and left without being seen.

## 2020-06-15 NOTE — ED Provider Notes (Signed)
[facility] EMERGENCY DEPARTMENT Provider Note   CSN: [id number] Arrival date & time: 06/15/20  1520     History Chief Complaint  Patient presents with  . Toe Pain    Tina Pham is a 44 y.o. female.  The history is provided by the patient. No language interpreter was used.  Toe Pain This is a new problem. Episode onset: 2 weeks. The problem occurs constantly. The problem has been gradually worsening. Nothing aggravates the symptoms. Nothing relieves the symptoms. She has tried nothing for the symptoms. The treatment provided moderate relief.  Pt has a black painful left 1st toe.  Pt reports she is diabetic but has not been on any medications in over 2 years.  No MD. Pt has appointment with new provider in [place].    Past Medical History:  Diagnosis Date  . Anxiety   . Arthritis   . Chronic back pain   . Chronic bronchitis (HCC)   . Depression   . Diabetes mellitus    lost weight and is not on meds now.  . High cholesterol   . History of kidney stones   . Hypertension   . Migraine   . Neuropathic pain of foot   . Plantar fasciitis   . Sciatica   . Torn rotator cuff     Patient Active Problem List   Diagnosis Date Noted  . S/P right rotator cuff repair open 09/19/18 repaired again 08/05/19 10/04/2018  . Complete tear of right rotator cuff   . Chronic pain of left knee   . Left shoulder pain 09/21/2015  . Disorders of bursae and tendons in shoulder region, unspecified 09/25/2013    Past Surgical History:  Procedure Laterality Date  . BACK SURGERY     herniated disc; had disc removed and then fusion; total of 3 surgeries  . EYE SURGERY Bilateral    removal of cyst and straightening of muscles  . KNEE ARTHROSCOPY WITH MEDIAL MENISECTOMY Left 12/27/2016   Procedure: LEFT KNEE DIAGNOSTIC ARTHROSCOPY;  Surgeon: Vickki Hearing, MD;  Location: [facility];  Service: Orthopedics;  Laterality: Left;  . SHOULDER OPEN ROTATOR CUFF REPAIR Right 09/19/2018   Procedure:  ROTATOR CUFF REPAIR SHOULDER OPEN;  Surgeon: Vickki Hearing, MD;  Location: [facility];  Service: Orthopedics;  Laterality: Right;  . SHOULDER OPEN ROTATOR CUFF REPAIR Right 08/05/2019   Procedure: ROTATOR CUFF REPAIR SHOULDER OPEN;  Surgeon: Vickki Hearing, MD;  Location: [facility];  Service: Orthopedics;  Laterality: Right;  . TUBAL LIGATION       OB History    Gravida  3   Para  3   Term  3   Preterm      AB      Living  3     SAB      TAB      Ectopic      Multiple      Live Births              Family History  Problem Relation Age of Onset  . Heart failure Mother   . COPD Mother   . Diabetes Mother   . Heart failure Father   . Diabetes Other     Social History   Tobacco Use  . Smoking status: Current Every Day Smoker    Packs/day: 1.00    Years: 20.00    Pack years: 20.00    Types: Cigarettes  . Smokeless tobacco: Never Used  Vaping Use  .  Vaping Use: Never used  Substance Use Topics  . Alcohol use: No  . Drug use: No    Home Medications Prior to Admission medications   Medication Sig Start Date End Date Taking? Authorizing Provider  gabapentin (NEURONTIN) 300 MG capsule Take 1 capsule (300 mg total) by mouth 3 (three) times daily. 03/10/20  Yes Vickki Hearing, MD  HYDROcodone-acetaminophen (NORCO/VICODIN) 5-325 MG tablet Take 1 tablet by mouth every 8 (eight) hours as needed for up to 7 days for moderate pain. 06/15/20 06/22/20 Yes Vickki Hearing, MD  ibuprofen (ADVIL) 200 MG tablet Take 800 mg by mouth every 6 (six) hours as needed.   Yes [provider]  ibuprofen (ADVIL) 800 MG tablet Take 1 tablet (800 mg total) by mouth 3 (three) times daily. Patient not taking: Reported on 06/15/2020 11/03/19   Vickki Hearing, MD  naproxen (NAPROSYN) 500 MG tablet TAKE 1 TABLET(500 MG) BY MOUTH TWICE DAILY AS NEEDED FOR MODERATE PAIN Patient not taking: Reported on 06/15/2020 04/13/20   Vickki Hearing, MD  tiZANidine  (ZANAFLEX) 4 MG tablet TAKE 1 TABLET(4 MG) BY MOUTH THREE TIMES DAILY Patient not taking: Reported on 06/15/2020 03/23/20   Vickki Hearing, MD    Allergies    Codeine, Benadryl [diphenhydramine hcl], and Compazine  Review of Systems   Review of Systems  Musculoskeletal: Positive for myalgias.  Skin: Positive for color change.  All other systems reviewed and are negative.   Physical Exam Updated Vital Signs BP (!) 181/92 (BP Location: Right Arm)   Pulse (!) 102   Temp 98.4 F (36.9 C) (Oral)   Resp 18   Ht 5\' 8"  (1.727 m)   Wt 108.9 kg   LMP 06/08/2020   SpO2 100%   BMI 36.49 kg/m   Physical Exam Vitals and nursing note reviewed.  Constitutional:      Appearance: She is well-developed.  HENT:     Head: Normocephalic.  Cardiovascular:     Rate and Rhythm: Normal rate.  Pulmonary:     Effort: Pulmonary effort is normal.  Abdominal:     General: There is no distension.  Musculoskeletal:        General: Tenderness present. Normal range of motion.     Cervical back: Normal range of motion.     Comments: Left 1st toe, discolored dark to base of toe,  No cap refill, cold to touch    Neurological:     Mental Status: She is alert and oriented to person, place, and time.         ED Results / Procedures / Treatments   Labs (all labs ordered are listed, but only abnormal results are displayed) Labs Reviewed  CBC WITH DIFFERENTIAL/PLATELET - Abnormal; Notable for the following components:      Result Value   Hemoglobin 9.5 (*)    HCT 34.8 (*)    MCV 73.4 (*)    MCH 20.0 (*)    MCHC 27.3 (*)    RDW 17.6 (*)    Platelets 439 (*)    All other components within normal limits  COMPREHENSIVE METABOLIC PANEL - Abnormal; Notable for the following components:   Glucose, Bld 289 (*)    AST 11 (*)    Total Bilirubin 0.2 (*)    All other components within normal limits  CBG MONITORING, ED - Abnormal; Notable for the following components:   Glucose-Capillary 308 (*)     All other components within normal limits  RESP  PANEL BY RT-PCR (FLU A&B, COVID) ARPGX2    EKG None  Radiology DG Toe Great Left  Result Date: 06/15/2020 CLINICAL DATA:  Atraumatic left great toe pain. EXAM: LEFT GREAT TOE COMPARISON:  None. FINDINGS: There is no evidence of fracture or dislocation. There is no evidence of arthropathy or other focal bone abnormality. Soft tissues are unremarkable. IMPRESSION: Negative. Electronically Signed   By: Aram Candela M.D.   On: 06/15/2020 16:55    Procedures Procedures (including critical care time)  Medications Ordered in ED Medications - No data to display  ED Course  I have reviewed the triage vital signs and the nursing notes.  Pertinent labs & imaging results that were available during my care of the patient were reviewed by me and considered in my medical decision making (see chart for details).    MDM Rules/Calculators/A&P                         Dr. Adela Lank in to see.  He was able to doppler faint pulse.   MDM:  I discussed with Dr. Edilia Bo Vascular surgeon.  He will see in the office next week.  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   Pt given rx for metformin, hydrocdone and doxycycline Final Clinical Impression(s) / ED Diagnoses Final diagnoses:  None    Rx / DC Orders ED Discharge Orders         Ordered    doxycycline (VIBRA-TABS) 100 MG tablet  2 times daily        06/15/20 1852    HYDROcodone-acetaminophen (NORCO/VICODIN) 5-325 MG tablet  Every 4 hours PRN,   Status:  Discontinued        06/15/20 1852    metFORMIN (GLUCOPHAGE) 500 MG tablet  2 times daily with meals        06/15/20 1852    HYDROcodone-acetaminophen (NORCO/VICODIN) 5-325 MG tablet  Every 4 hours PRN        06/15/20 1854        An After Visit Summary was printed and given to the patient.    Osie Cheeks 06/15/20 Otis Dials, MD 06/15/20 1919

## 2020-06-15 NOTE — ED Provider Notes (Signed)
Medical screening examination/treatment/procedure(s) were conducted as a shared visit with non-physician practitioner(s) and myself.  I personally evaluated the patient during the encounter.      See the written copy of this report in the patient's paper medical record.  These results did not interface directly into the electronic medical record and are summarized here.  44 yo F with a chief complaints of duskiness to her left great toe.  Going on for a few weeks now.  On exam the patient's toe is somewhat colder than the rest of the foot and tender to the touch.  No palpable pulse to the left foot.  Palpable pulse to the right foot.  Patient has a dopplerable signal to the left dorsalis pedis but is significantly diminished.  Discussed with vascular who recommended discharge home and follow-up with him in the office.   Melene Plan, DO 06/15/20 2144

## 2020-06-16 ENCOUNTER — Telehealth: Payer: Self-pay | Admitting: *Deleted

## 2020-06-16 ENCOUNTER — Other Ambulatory Visit: Payer: Self-pay | Admitting: *Deleted

## 2020-06-16 DIAGNOSIS — I739 Peripheral vascular disease, unspecified: Secondary | ICD-10-CM

## 2020-06-16 NOTE — Telephone Encounter (Signed)
Left VM to call and schedule New Pt appt with Dr. Arbie Cookey in [facility] Monday 11/29. Will need ABI and left LE arterial duplex at [facility] prior to appt.

## 2020-06-18 ENCOUNTER — Ambulatory Visit (HOSPITAL_COMMUNITY)
Admission: RE | Admit: 2020-06-18 | Discharge: 2020-06-18 | Disposition: A | Discharge status: Home / Self Care | Payer: BLUE CROSS/BLUE SHIELD | Source: Ambulatory Visit | Attending: Vascular Surgery | Admitting: Vascular Surgery

## 2020-06-18 ENCOUNTER — Other Ambulatory Visit: Payer: Self-pay

## 2020-06-18 DIAGNOSIS — I739 Peripheral vascular disease, unspecified: Secondary | ICD-10-CM | POA: Diagnosis present

## 2020-06-21 ENCOUNTER — Encounter: Payer: Self-pay | Admitting: Vascular Surgery

## 2020-06-21 ENCOUNTER — Other Ambulatory Visit: Payer: Self-pay

## 2020-06-21 ENCOUNTER — Ambulatory Visit (INDEPENDENT_AMBULATORY_CARE_PROVIDER_SITE_OTHER): Payer: BLUE CROSS/BLUE SHIELD | Admitting: Vascular Surgery

## 2020-06-21 VITALS — BP 132/58 | HR 97 | Temp 98.1°F | Resp 18 | Ht 68.0 in | Wt 223.0 lb

## 2020-06-21 DIAGNOSIS — I739 Peripheral vascular disease, unspecified: Secondary | ICD-10-CM | POA: Diagnosis not present

## 2020-06-21 NOTE — Progress Notes (Signed)
Vascular and Vein Specialist of   Patient name: Tina Pham MRN: [id number] DOB: 1976/07/12 Sex: female  REASON FOR CONSULT: Evaluation of critical limb ischemia left foot  HPI: Tina Pham is a 44 y.o. female, here today for evaluation.  She had presented to the [facility] emergency department on 06/15/2020 with pain and duskiness in her great toe.  She reported this is been present for some time and has been progressive with worsening skin changes.  She has a long history of diabetes but has had very poor medical care recently.  Was on insulin in the past but has not had any medication for several years.  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  This predated her check great toe changes.  She does have history of rotator cuff repair x2 on the right and this was recent  Past Medical History:  Diagnosis Date  . Anxiety   . Arthritis   . Chronic back pain   . Chronic bronchitis (HCC)   . Depression   . Diabetes mellitus    lost weight and is not on meds now.  . High cholesterol   . History of kidney stones   . Hypertension   . Migraine   . Neuropathic pain of foot   . Plantar fasciitis   . Sciatica   . Torn rotator cuff     Family History  Problem Relation Age of Onset  . Heart failure Mother   . COPD Mother   . Diabetes Mother   . Heart failure Father   . Diabetes Other     SOCIAL HISTORY: Social History   Socioeconomic History  . Marital status: Legally Separated    Spouse name: Not on file  . Number of children: Not on file  . Years of education: Not on file  . Highest education level: Not on file  Occupational History  . Not on file  Tobacco Use  . Smoking status: Current Every Day Smoker    Packs/day: 1.00    Years: 20.00    Pack years: 20.00    Types: Cigarettes  . Smokeless tobacco: Never Used  Vaping Use  . Vaping Use: Never used  Substance and Sexual Activity  . Alcohol use: No  . Drug use:  No  . Sexual activity: Yes    Birth control/protection: Surgical  Other Topics Concern  . Not on file  Social History Narrative  . Not on file   Social Determinants of Health   Financial Resource Strain:   . Difficulty of Paying Living Expenses: Not on file  Food Insecurity:   . Worried About Programme researcher, broadcasting/film/video in the Last Year: Not on file  . Ran Out of Food in the Last Year: Not on file  Transportation Needs:   . Lack of Transportation (Medical): Not on file  . Lack of Transportation (Non-Medical): Not on file  Physical Activity:   . Days of Exercise per Week: Not on file  . Minutes of Exercise per Session: Not on file  Stress:   . Feeling of Stress : Not on file  Social Connections:   . Frequency of Communication with Friends and Family: Not on file  . Frequency of Social Gatherings with Friends and Family: Not on file  . Attends Religious Services: Not on file  . Active Member of Clubs or Organizations: Not on file  . Attends Banker Meetings: Not on file  . Marital  Status: Not on file  Intimate Partner Violence:   . Fear of Current or Ex-Partner: Not on file  . Emotionally Abused: Not on file  . Physically Abused: Not on file  . Sexually Abused: Not on file    Allergies  Allergen Reactions  . Codeine Other (See Comments)    Upset Stomach  . Benadryl [Diphenhydramine Hcl] Palpitations  . Compazine Palpitations    Current Outpatient Medications  Medication Sig Dispense Refill  . doxycycline (VIBRA-TABS) 100 MG tablet Take 1 tablet (100 mg total) by mouth 2 (two) times daily. 20 tablet 0  . gabapentin (NEURONTIN) 300 MG capsule Take 1 capsule (300 mg total) by mouth 3 (three) times daily. 90 capsule 5  . HYDROcodone-acetaminophen (NORCO/VICODIN) 5-325 MG tablet Take 1 tablet by mouth every 4 (four) hours as needed. 20 tablet 0  . ibuprofen (ADVIL) 800 MG tablet Take 1 tablet (800 mg total) by mouth 3 (three) times daily. 90 tablet 5  . glipiZIDE  (GLUCOTROL) 5 MG tablet Take 1 tablet (5 mg total) by mouth 2 (two) times daily. (Patient not taking: Reported on 06/21/2020) 60 tablet 11   No current facility-administered medications for this visit.    REVIEW OF SYSTEMS:  [X]  denotes positive finding, [ ]  denotes negative finding Cardiac  Comments:  Chest pain or chest pressure:    Shortness of breath upon exertion:    Short of breath when lying flat:    Irregular heart rhythm:        Vascular    Pain in calf, thigh, or hip brought on by ambulation: x   Pain in feet at night that wakes you up from your sleep:  x   Blood clot in your veins:    Leg swelling:  x       Pulmonary    Oxygen at home:    Productive cough:     Wheezing:         Neurologic    Sudden weakness in arms or legs:  x   Sudden numbness in arms or legs:  x   Sudden onset of difficulty speaking or slurred speech:    Temporary loss of vision in one eye:     Problems with dizziness:         Gastrointestinal    Blood in stool:     Vomited blood:         Genitourinary    Burning when urinating:     Blood in urine:        Psychiatric    Major depression:         Hematologic    Bleeding problems:    Problems with blood clotting too easily:        Skin    Rashes or ulcers:        Constitutional    Fever or chills:      PHYSICAL EXAM: Vitals:   06/21/20 0845  BP: (!) 132/58  Pulse: 97  Resp: 18  Temp: 98.1 F (36.7 C)  TempSrc: Other (Comment)  SpO2: 97%  Weight: 223 lb (101.2 kg)  Height: 5\' 8"  (1.727 m)    GENERAL: The patient is a well-nourished female, in no acute distress. The vital signs are documented above. VASCULAR: Carotid arteries without bruits bilaterally.  2+ radial pulses.  I do not palpate a left femoral pulse.  She has a 2+ right dorsalis pedis pulse and absent pulses on the left foot PULMONARY: There is good air exchange ABDOMEN:  Soft and non-tender  MUSCULOSKELETAL: There are no major deformities or  cyanosis. NEUROLOGIC: No focal weakness or paresthesias are detected. SKIN: She does have darkened discoloration of her entire left great toe.  She has dry gangrene on the tip around the nailbed on the left great toe PSYCHIATRIC: The patient has a normal affect.  DATA:   Noninvasive studies on her left leg from 1126 reveal ankle arm index of 0.55 with waveform suggesting proximal disease  MEDICAL ISSUES:  Critical limb ischemia with dry gangrenous changes of her left great toe.  This does appear to be chronic ischemia with acute worsening potentially from atheroemboli.  I have recommended CT scan of her chest abdomen pelvis and runoff soon as possible for planning of treatment.  Explained that this may require angioplasty and stenting of the stenosis versus bypass surgery depending on her studies.  We will communicate with her following CT scan   Larina Earthly, MD [facility] Vascular and Vein Specialists of [facility] phone [phone]

## 2020-06-22 ENCOUNTER — Ambulatory Visit (HOSPITAL_COMMUNITY)
Admission: RE | Admit: 2020-06-22 | Discharge: 2020-06-22 | Disposition: A | Discharge status: Home / Self Care | Payer: BLUE CROSS/BLUE SHIELD | Source: Ambulatory Visit | Attending: Vascular Surgery | Admitting: Vascular Surgery

## 2020-06-22 DIAGNOSIS — I739 Peripheral vascular disease, unspecified: Secondary | ICD-10-CM | POA: Diagnosis present

## 2020-06-22 MED ORDER — IOHEXOL 350 MG/ML SOLN
150.0000 mL | Freq: Once | INTRAVENOUS | Status: AC | PRN
  Administered 2020-06-22: 125 mL via INTRAVENOUS

## 2020-06-24 ENCOUNTER — Other Ambulatory Visit: Payer: Self-pay

## 2020-06-28 ENCOUNTER — Other Ambulatory Visit: Payer: Self-pay | Admitting: Orthopedic Surgery

## 2020-06-28 MED ORDER — HYDROCODONE-ACETAMINOPHEN 5-325 MG PO TABS: 1.0000 | ORAL_TABLET | ORAL | 0 refills | Status: AC | PRN

## 2020-06-28 NOTE — Telephone Encounter (Signed)
rx can be written for 2 days

## 2020-06-29 ENCOUNTER — Other Ambulatory Visit
Admission: RE | Admit: 2020-06-29 | Discharge: 2020-06-29 | Disposition: A | Discharge status: Home / Self Care | Payer: BLUE CROSS/BLUE SHIELD | Source: Ambulatory Visit | Attending: Vascular Surgery | Admitting: Vascular Surgery

## 2020-06-29 ENCOUNTER — Other Ambulatory Visit: Payer: Self-pay

## 2020-06-29 DIAGNOSIS — Z01812 Encounter for preprocedural laboratory examination: Secondary | ICD-10-CM | POA: Diagnosis present

## 2020-06-29 DIAGNOSIS — Z20822 Contact with and (suspected) exposure to covid-19: Secondary | ICD-10-CM | POA: Diagnosis not present

## 2020-06-30 LAB — SARS CORONAVIRUS 2 (TAT 6-24 HRS): SARS Coronavirus 2: NEGATIVE

## 2020-07-01 ENCOUNTER — Ambulatory Visit (HOSPITAL_COMMUNITY)
Admission: RE | Admit: 2020-07-01 | Discharge: 2020-07-01 | Disposition: A | Discharge status: Home / Self Care | Payer: BLUE CROSS/BLUE SHIELD | Attending: Vascular Surgery | Admitting: Vascular Surgery

## 2020-07-01 ENCOUNTER — Other Ambulatory Visit: Payer: Self-pay

## 2020-07-01 ENCOUNTER — Encounter (HOSPITAL_COMMUNITY)
Admission: RE | Disposition: A | Discharge status: Home / Self Care | Payer: Self-pay | Source: Home / Self Care | Attending: Vascular Surgery

## 2020-07-01 DIAGNOSIS — I70262 Atherosclerosis of native arteries of extremities with gangrene, left leg: Secondary | ICD-10-CM | POA: Insufficient documentation

## 2020-07-01 DIAGNOSIS — E1152 Type 2 diabetes mellitus with diabetic peripheral angiopathy with gangrene: Secondary | ICD-10-CM | POA: Diagnosis not present

## 2020-07-01 DIAGNOSIS — Z888 Allergy status to other drugs, medicaments and biological substances status: Secondary | ICD-10-CM | POA: Insufficient documentation

## 2020-07-01 DIAGNOSIS — Z885 Allergy status to narcotic agent status: Secondary | ICD-10-CM | POA: Diagnosis not present

## 2020-07-01 DIAGNOSIS — F17210 Nicotine dependence, cigarettes, uncomplicated: Secondary | ICD-10-CM | POA: Diagnosis not present

## 2020-07-01 DIAGNOSIS — Z79899 Other long term (current) drug therapy: Secondary | ICD-10-CM | POA: Insufficient documentation

## 2020-07-01 HISTORY — PX: ABDOMINAL AORTOGRAM W/LOWER EXTREMITY: CATH118223

## 2020-07-01 HISTORY — PX: PERIPHERAL VASCULAR INTERVENTION: CATH118257

## 2020-07-01 LAB — POCT I-STAT, CHEM 8
BUN: 11 mg/dL (ref 6–20)
Calcium, Ion: 1.21 mmol/L (ref 1.15–1.40)
Chloride: 104 mmol/L (ref 98–111)
Creatinine, Ser: 0.5 mg/dL (ref 0.44–1.00)
Glucose, Bld: 139 mg/dL — ABNORMAL HIGH (ref 70–99)
HCT: 35 % — ABNORMAL LOW (ref 36.0–46.0)
Hemoglobin: 11.9 g/dL — ABNORMAL LOW (ref 12.0–15.0)
Potassium: 3.7 mmol/L (ref 3.5–5.1)
Sodium: 141 mmol/L (ref 135–145)
TCO2: 23 mmol/L (ref 22–32)

## 2020-07-01 LAB — POCT ACTIVATED CLOTTING TIME
Activated Clotting Time: 184 seconds
Activated Clotting Time: 166 seconds

## 2020-07-01 LAB — GLUCOSE, CAPILLARY: Glucose-Capillary: 134 mg/dL — ABNORMAL HIGH (ref 70–99)

## 2020-07-01 SURGERY — ABDOMINAL AORTOGRAM W/LOWER EXTREMITY
Anesthesia: LOCAL

## 2020-07-01 MED ORDER — CLOPIDOGREL BISULFATE 75 MG PO TABS: 300.0000 mg | ORAL_TABLET | Freq: Once | ORAL | Status: DC

## 2020-07-01 MED ORDER — HYDROCODONE-ACETAMINOPHEN 5-325 MG PO TABS: 1.0000 | ORAL_TABLET | ORAL | 0 refills | Status: DC | PRN

## 2020-07-01 MED ORDER — MIDAZOLAM HCL 2 MG/2ML IJ SOLN
INTRAMUSCULAR | Status: AC
  Filled 2020-07-01: qty 2

## 2020-07-01 MED ORDER — CLOPIDOGREL BISULFATE 75 MG PO TABS: 75.0000 mg | ORAL_TABLET | Freq: Every day | ORAL | 11 refills | Status: AC

## 2020-07-01 MED ORDER — LIDOCAINE HCL (PF) 1 % IJ SOLN
INTRAMUSCULAR | Status: AC
  Filled 2020-07-01: qty 30

## 2020-07-01 MED ORDER — ATORVASTATIN CALCIUM 10 MG PO TABS: 10.0000 mg | ORAL_TABLET | Freq: Every day | ORAL | 11 refills | Status: DC

## 2020-07-01 MED ORDER — FENTANYL CITRATE (PF) 100 MCG/2ML IJ SOLN
INTRAMUSCULAR | Status: AC
  Filled 2020-07-01: qty 2

## 2020-07-01 MED ORDER — SODIUM CHLORIDE 0.9 % IV SOLN: 250.0000 mL | INTRAVENOUS | Status: DC | PRN

## 2020-07-01 MED ORDER — ASPIRIN EC 81 MG PO TBEC: 81.0000 mg | DELAYED_RELEASE_TABLET | Freq: Every day | ORAL | Status: DC

## 2020-07-01 MED ORDER — LIDOCAINE HCL (PF) 1 % IJ SOLN
INTRAMUSCULAR | Status: DC | PRN
  Administered 2020-07-01: 15 mL via INTRADERMAL

## 2020-07-01 MED ORDER — CLOPIDOGREL BISULFATE 300 MG PO TABS
ORAL_TABLET | ORAL | Status: AC
  Filled 2020-07-01: qty 1

## 2020-07-01 MED ORDER — ATORVASTATIN CALCIUM 10 MG PO TABS: 10.0000 mg | ORAL_TABLET | Freq: Every day | ORAL | Status: DC

## 2020-07-01 MED ORDER — IODIXANOL 320 MG/ML IV SOLN
INTRAVENOUS | Status: DC | PRN
  Administered 2020-07-01: 175 mL via INTRA_ARTERIAL

## 2020-07-01 MED ORDER — CLOPIDOGREL BISULFATE 300 MG PO TABS
ORAL_TABLET | ORAL | Status: DC | PRN
  Administered 2020-07-01: 300 mg via ORAL

## 2020-07-01 MED ORDER — SODIUM CHLORIDE 0.9% FLUSH: 3.0000 mL | INTRAVENOUS | Status: DC | PRN

## 2020-07-01 MED ORDER — HEPARIN SODIUM (PORCINE) 1000 UNIT/ML IJ SOLN
INTRAMUSCULAR | Status: DC | PRN
  Administered 2020-07-01: 10000 [IU] via INTRAVENOUS

## 2020-07-01 MED ORDER — FENTANYL CITRATE (PF) 100 MCG/2ML IJ SOLN
INTRAMUSCULAR | Status: DC | PRN
  Administered 2020-07-01: 25 ug via INTRAVENOUS

## 2020-07-01 MED ORDER — MORPHINE SULFATE (PF) 4 MG/ML IV SOLN
INTRAVENOUS | Status: AC
  Filled 2020-07-01: qty 1

## 2020-07-01 MED ORDER — ACETAMINOPHEN 325 MG PO TABS: 650.0000 mg | ORAL_TABLET | ORAL | Status: DC | PRN

## 2020-07-01 MED ORDER — ONDANSETRON HCL 4 MG/2ML IJ SOLN: 4.0000 mg | Freq: Four times a day (QID) | INTRAMUSCULAR | Status: DC | PRN

## 2020-07-01 MED ORDER — HEPARIN (PORCINE) IN NACL 1000-0.9 UT/500ML-% IV SOLN
INTRAVENOUS | Status: AC
  Filled 2020-07-01: qty 1000

## 2020-07-01 MED ORDER — MIDAZOLAM HCL 2 MG/2ML IJ SOLN
INTRAMUSCULAR | Status: DC | PRN
  Administered 2020-07-01: 1 mg via INTRAVENOUS

## 2020-07-01 MED ORDER — CLOPIDOGREL BISULFATE 75 MG PO TABS: 75.0000 mg | ORAL_TABLET | Freq: Every day | ORAL | Status: DC

## 2020-07-01 MED ORDER — HEPARIN (PORCINE) IN NACL 1000-0.9 UT/500ML-% IV SOLN
INTRAVENOUS | Status: DC | PRN
  Administered 2020-07-01 (×2): 500 mL

## 2020-07-01 MED ORDER — MORPHINE SULFATE (PF) 4 MG/ML IV SOLN
2.0000 mg | INTRAVENOUS | Status: DC | PRN
  Administered 2020-07-01 (×3): 2 mg via INTRAVENOUS
  Filled 2020-07-01: qty 1

## 2020-07-01 MED ORDER — SODIUM CHLORIDE 0.9% FLUSH: 3.0000 mL | Freq: Two times a day (BID) | INTRAVENOUS | Status: DC

## 2020-07-01 MED ORDER — HYDRALAZINE HCL 20 MG/ML IJ SOLN: 5.0000 mg | INTRAMUSCULAR | Status: DC | PRN

## 2020-07-01 MED ORDER — SODIUM CHLORIDE 0.9 % WEIGHT BASED INFUSION: 1.0000 mL/kg/h | INTRAVENOUS | Status: DC

## 2020-07-01 MED ORDER — HEPARIN SODIUM (PORCINE) 1000 UNIT/ML IJ SOLN
INTRAMUSCULAR | Status: AC
  Filled 2020-07-01: qty 1

## 2020-07-01 MED ORDER — LABETALOL HCL 5 MG/ML IV SOLN: 10.0000 mg | INTRAVENOUS | Status: DC | PRN

## 2020-07-01 MED ORDER — MORPHINE SULFATE (PF) 2 MG/ML IV SOLN
INTRAVENOUS | Status: AC
  Filled 2020-07-01: qty 1

## 2020-07-01 MED ORDER — SODIUM CHLORIDE 0.9 % IV SOLN: INTRAVENOUS | Status: DC

## 2020-07-01 SURGICAL SUPPLY — 16 items
CATH ANGIO 5F BER2 65CM (CATHETERS) ×3 IMPLANT
CATH OMNI FLUSH 5F 65CM (CATHETERS) ×3 IMPLANT
DEVICE TORQUE .025-.038 (MISCELLANEOUS) ×3 IMPLANT
GLIDEWIRE ADV .035X260CM (WIRE) ×3 IMPLANT
KIT ENCORE 26 ADVANTAGE (KITS) ×3 IMPLANT
KIT MICROPUNCTURE NIT STIFF (SHEATH) ×3 IMPLANT
KIT PV (KITS) ×3 IMPLANT
SHEATH BRITE TIP 7FR 35CM (SHEATH) ×3 IMPLANT
SHEATH PINNACLE 5F 10CM (SHEATH) ×3 IMPLANT
SHEATH PROBE COVER 6X72 (BAG) ×3 IMPLANT
STENT VIABAHN VBX 7X19X80 (Permanent Stent) ×3 IMPLANT
STENT VIABAHN VBX 7X59X80 (Permanent Stent) ×3 IMPLANT
SYR MEDRAD MARK V 150ML (SYRINGE) ×3 IMPLANT
TRANSDUCER W/STOPCOCK (MISCELLANEOUS) ×3 IMPLANT
TRAY PV CATH (CUSTOM PROCEDURE TRAY) ×3 IMPLANT
WIRE BENTSON .035X145CM (WIRE) ×3 IMPLANT

## 2020-07-01 NOTE — H&P (Signed)
History and Physical Interval Note:  07/01/2020 12:57 PM  Tina Pham  has presented today for surgery, with the diagnosis of PAD.  The various methods of treatment have been discussed with the patient and family. After consideration of risks, benefits and other options for treatment, the patient has consented to  Procedure(s): ABDOMINAL AORTOGRAM W/LOWER EXTREMITY (N/A) as a surgical intervention.  The patient's history has been reviewed, patient examined, no change in status, stable for surgery.  I have reviewed the patient's chart and labs.  Questions were answered to the patient's satisfaction.     Cephus Shelling  Vascular and Vein Specialist of [place]  Patient name: Tina Pham  MRN: [id number]        DOB: [birth year]          Sex: female  REASON FOR CONSULT: Evaluation of critical limb ischemia left foot  HPI: Tina Pham is a 44 y.o. female, here today for evaluation.  She had presented to the [facility] emergency department on 06/15/2020 with pain and duskiness in her great toe.  She reported this is been present for some time and has been progressive with worsening skin changes.  She has a long history of diabetes but has had very poor medical care recently.  Was on insulin in the past but has not had any medication for several years.  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  This predated her check great toe changes.  She does have history of rotator cuff repair x2 on the right and this was recent      Past Medical History:  Diagnosis Date  . Anxiety   . Arthritis   . Chronic back pain   . Chronic bronchitis (HCC)   . Depression   . Diabetes mellitus    lost weight and is not on meds now.  . High cholesterol   . History of kidney stones   . Hypertension   . Migraine   . Neuropathic pain of foot   . Plantar fasciitis   . Sciatica   . Torn rotator cuff          Family History  Problem  Relation Age of Onset  . Heart failure Mother   . COPD Mother   . Diabetes Mother   . Heart failure Father   . Diabetes Other     SOCIAL HISTORY: Social History        Socioeconomic History  . Marital status: Legally Separated    Spouse name: Not on file  . Number of children: Not on file  . Years of education: Not on file  . Highest education level: Not on file  Occupational History  . Not on file  Tobacco Use  . Smoking status: Current Every Day Smoker    Packs/day: 1.00    Years: 20.00    Pack years: 20.00    Types: Cigarettes  . Smokeless tobacco: Never Used  Vaping Use  . Vaping Use: Never used  Substance and Sexual Activity  . Alcohol use: No  . Drug use: No  . Sexual activity: Yes    Birth control/protection: Surgical  Other Topics Concern  . Not on file  Social History Narrative  . Not on file   Social Determinants of Health      Financial Resource Strain:   . Difficulty of Paying Living Expenses: Not on file  Food Insecurity:   . Worried About Programme researcher, broadcasting/film/video in the Last  Year: Not on file  . Ran Out of Food in the Last Year: Not on file  Transportation Needs:   . Lack of Transportation (Medical): Not on file  . Lack of Transportation (Non-Medical): Not on file  Physical Activity:   . Days of Exercise per Week: Not on file  . Minutes of Exercise per Session: Not on file  Stress:   . Feeling of Stress : Not on file  Social Connections:   . Frequency of Communication with Friends and Family: Not on file  . Frequency of Social Gatherings with Friends and Family: Not on file  . Attends Religious Services: Not on file  . Active Member of Clubs or Organizations: Not on file  . Attends Banker Meetings: Not on file  . Marital Status: Not on file  Intimate Partner Violence:   . Fear of Current or Ex-Partner: Not on file  . Emotionally Abused: Not on file  . Physically Abused: Not on file  . Sexually Abused: Not  on file    Allergies  Allergen Reactions  . Codeine Other (See Comments)    Upset Stomach  . Benadryl [Diphenhydramine Hcl] Palpitations  . Compazine Palpitations          Current Outpatient Medications  Medication Sig Dispense Refill  . doxycycline (VIBRA-TABS) 100 MG tablet Take 1 tablet (100 mg total) by mouth 2 (two) times daily. 20 tablet 0  . gabapentin (NEURONTIN) 300 MG capsule Take 1 capsule (300 mg total) by mouth 3 (three) times daily. 90 capsule 5  . HYDROcodone-acetaminophen (NORCO/VICODIN) 5-325 MG tablet Take 1 tablet by mouth every 4 (four) hours as needed. 20 tablet 0  . ibuprofen (ADVIL) 800 MG tablet Take 1 tablet (800 mg total) by mouth 3 (three) times daily. 90 tablet 5  . glipiZIDE (GLUCOTROL) 5 MG tablet Take 1 tablet (5 mg total) by mouth 2 (two) times daily. (Patient not taking: Reported on 06/21/2020) 60 tablet 11   No current facility-administered medications for this visit.    REVIEW OF SYSTEMS:  [X]  denotes positive finding, [ ]  denotes negative finding Cardiac  Comments:  Chest pain or chest pressure:    Shortness of breath upon exertion:    Short of breath when lying flat:    Irregular heart rhythm:        Vascular    Pain in calf, thigh, or hip brought on by ambulation: x   Pain in feet at night that wakes you up from your sleep:  x   Blood clot in your veins:    Leg swelling:  x       Pulmonary    Oxygen at home:    Productive cough:     Wheezing:         Neurologic    Sudden weakness in arms or legs:  x   Sudden numbness in arms or legs:  x   Sudden onset of difficulty speaking or slurred speech:    Temporary loss of vision in one eye:     Problems with dizziness:         Gastrointestinal    Blood in stool:     Vomited blood:         Genitourinary    Burning when urinating:     Blood in urine:        Psychiatric    Major depression:          Hematologic    Bleeding problems:  Problems with blood clotting too easily:        Skin    Rashes or ulcers:        Constitutional    Fever or chills:      PHYSICAL EXAM:    Vitals:   06/21/20 0845  BP: (!) 132/58  Pulse: 97  Resp: 18  Temp: 98.1 F (36.7 C)  TempSrc: Other (Comment)  SpO2: 97%  Weight: 223 lb (101.2 kg)  Height: 5\' 8"  (1.727 m)    GENERAL: The patient is a well-nourished female, in no acute distress. The vital signs are documented above. VASCULAR: Carotid arteries without bruits bilaterally.  2+ radial pulses.  I do not palpate a left femoral pulse.  She has a 2+ right dorsalis pedis pulse and absent pulses on the left foot PULMONARY: There is good air exchange ABDOMEN: Soft and non-tender  MUSCULOSKELETAL: There are no major deformities or cyanosis. NEUROLOGIC: No focal weakness or paresthesias are detected. SKIN: She does have darkened discoloration of her entire left great toe.  She has dry gangrene on the tip around the nailbed on the left great toe PSYCHIATRIC: The patient has a normal affect.  DATA:   Noninvasive studies on her left leg from 1126 reveal ankle arm index of 0.55 with waveform suggesting proximal disease  MEDICAL ISSUES:  Critical limb ischemia with dry gangrenous changes of her left great toe.  This does appear to be chronic ischemia with acute worsening potentially from atheroemboli.  I have recommended CT scan of her chest abdomen pelvis and runoff soon as possible for planning of treatment.  Explained that this may require angioplasty and stenting of the stenosis versus bypass surgery depending on her studies.  We will communicate with her following CT scan   , MD [facility] Vascular and Vein Specialists of [facility] phone [phone]

## 2020-07-01 NOTE — Progress Notes (Signed)
Patient was given discharge instructions. Patient verbalized understanding. ?

## 2020-07-01 NOTE — Progress Notes (Signed)
Site Area: left groin  Site Prior to Removal: Level 0  Pressure Applied For: 25 minutes Manual: yes Patient Status During Pull: stable Post Pull Site: Level 0 Post Pull Instructions Given: YES Post Pull Pulses Present: palpable Dressing Applied: gauze and tegaderm Bedrest begins @ 1625 x 4 hours Comments:  Removed by Griffith Citron, RN, [facility]IS

## 2020-07-01 NOTE — Discharge Instructions (Signed)
Femoral Site Care This sheet gives you information about how to care for yourself after your procedure. Your health care provider may also give you more specific instructions. If you have problems or questions, contact your health care provider. What can I expect after the procedure? After the procedure, it is common to have:  Bruising that usually fades within 1-2 weeks.  Tenderness at the site. Follow these instructions at home: Wound care  Follow instructions from your health care provider about how to take care of your insertion site. Make sure you: ? Wash your hands with soap and water before you change your bandage (dressing). If soap and water are not available, use hand sanitizer. ? Change your dressing as told by your health care provider. ? Leave stitches (sutures), skin glue, or adhesive strips in place. These skin closures may need to stay in place for 2 weeks or longer. If adhesive strip edges start to loosen and curl up, you may trim the loose edges. Do not remove adhesive strips completely unless your health care provider tells you to do that.  Do not take baths, swim, or use a hot tub until your health care provider approves.  You may shower 24-48 hours after the procedure or as told by your health care provider. ? Gently wash the site with plain soap and water. ? Pat the area dry with a clean towel. ? Do not rub the site. This may cause bleeding.  Do not apply powder or lotion to the site. Keep the site clean and dry.  Check your femoral site every day for signs of infection. Check for: ? Redness, swelling, or pain. ? Fluid or blood. ? Warmth. ? Pus or a bad smell. Activity  For the first 2-3 days after your procedure, or as long as directed: ? Avoid climbing stairs as much as possible. ? Do not squat.  Do not lift anything that is heavier than 10 lb (4.5 kg), or the limit that you are told, until your health care provider says that it is safe.  Rest as  directed. ? Avoid sitting for a long time without moving. Get up to take short walks every 1-2 hours.  Do not drive for 24 hours if you were given a medicine to help you relax (sedative). General instructions  Take over-the-counter and prescription medicines only as told by your health care provider.  Keep all follow-up visits as told by your health care provider. This is important. Contact a health care provider if you have:  A fever or chills.  You have redness, swelling, or pain around your insertion site. Get help right away if:  The catheter insertion area swells very fast.  You pass out.  You suddenly start to sweat or your skin gets clammy.  The catheter insertion area is bleeding, and the bleeding does not stop when you hold steady pressure on the area.  The area near or just beyond the catheter insertion site becomes pale, cool, tingly, or numb. These symptoms may represent a serious problem that is an emergency. Do not wait to see if the symptoms will go away. Get medical help right away. Call your local emergency services (911 in the [place] Do not drive yourself to the hospital. Summary  After the procedure, it is common to have bruising that usually fades within 1-2 weeks.  Check your femoral site every day for signs of infection.  Do not lift anything that is heavier than 10 lb (4.5 kg), or the   limit that you are told, until your health care provider says that it is safe. This information is not intended to replace advice given to you by your health care provider. Make sure you discuss any questions you have with your health care provider. Document Revised: 07/23/2017 Document Reviewed: 07/23/2017 Elsevier Patient Education  2020 [employer] Inc[employer]  

## 2020-07-01 NOTE — Op Note (Signed)
Patient name: Tina Pham MRN: [id number] DOB: May 23, 1976 Sex: female  07/01/2020 Pre-operative Diagnosis: Critical limb ischemia of left foot with tissue loss Post-operative diagnosis:  Same Surgeon:  Cephus Shelling, MD Procedure Performed: 1.  Ultrasound guided access of left common femoral artery 2.  Aortogram including catheter selection of aorta 3.  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  Bilateral lower extremity arteriogram with runoff 5.  43 minutes of monitored moderate conscious sedation time  Indications: Patient is a 44 year old female who was seen in consultation by Dr. Arbie Cookey and noted to have skin changes to the left great toe.  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  She presents today for planned endovascular intervention after risk benefits discussed  Findings:   Ultrasound-guided access of the left common femoral artery with successful crossing of the left common iliac artery occlusion retrograde.  I initially stented this with a 7 mm x 59 mm VBX.  There appeared to be some residual diseased segment of common iliac artery just proximal to the stent that did not respond to balloon angioplasty, so I extended more proximal with a second 7 mm x 19 mm VBX with excellent results.  She appears to have inline flow down the left lower extremity now with patent SFA popliteal and three-vessel runoff.  Both SFAs are diffusely diseased but no flow-limiting stenosis.   Procedure:  The patient was identified in the holding area and taken to room 8.  The patient was then placed supine on the table and prepped and draped in the usual sterile fashion.  A time out was called.  Ultrasound was used to evaluate the left common femoral artery.  It was patent .  A digital ultrasound image was acquired.  A micropuncture needle was used to access the left common femoral  artery under ultrasound guidance.  An 018 wire was advanced without resistance and a micropuncture sheath was placed.  The 018 wire was removed and a benson wire was placed.  The micropuncture sheath was exchanged for a 5 french sheath.  I then used a Glidewire advantage with a BER2 catheter to cross the left common iliac artery occlusion retrograde.  I was able to cross the lesion successfully and get into the native aorta that I confirmed with hand-injection.  I then got a formal aortogram for stent planning.  Then placed a 7 French bright tip sheath in the left common femoral artery retrograde over the Glidewire advantage.  Patient was given 100 units per kilogram heparin.  I initially stented the lesion with a 7 x 59 mm VBX.  I was not satisfied with the results given a diseased segment more proximal and a second 7 mm x 19 mm VBX was placed in the proximal left common iliac artery up to the aortic bifurcation.  Excellent results with no residual stenosis.  She has a widely patent left common iliac artery now.  We did get bilateral lower extremity runoff that showed patent SFA popliteal and three-vessel runoff bilaterally.  Wires and catheters were removed.  To be taken holding to have the left sheath removed with manual pressure.  Plan: Patient will need dual antiplatelet therapy on aspirin Plavix.  I will have her follow-up Dr. Arbie Cookey in 2 to 3 weeks for a toe check.   Cephus Shelling, MD Vascular and Vein Specialists of  [facility] Office: [place]

## 2020-07-02 ENCOUNTER — Encounter (HOSPITAL_COMMUNITY): Payer: Self-pay | Admitting: Vascular Surgery

## 2020-07-02 LAB — POCT ACTIVATED CLOTTING TIME: Activated Clotting Time: 208 seconds

## 2020-07-06 ENCOUNTER — Other Ambulatory Visit: Payer: Self-pay | Admitting: Physician Assistant

## 2020-07-06 MED ORDER — HYDROCODONE-ACETAMINOPHEN 5-325 MG PO TABS: 1.0000 | ORAL_TABLET | Freq: Four times a day (QID) | ORAL | 0 refills | Status: DC | PRN

## 2020-07-06 NOTE — Progress Notes (Signed)
Pt called and requested refill for pain medications.  RN discussed with Dr. Chestine Spore and okay for her to have Vicodin #20 no refills and she does have follow up with Dr. Arbie Cookey on 08/02/2020 in [place].   If she needs further refills on pain medication, she will need to be seen by MD.   Doreatha Massed, [facility] 07/06/2020 11:36 AM

## 2020-07-07 ENCOUNTER — Encounter: Payer: Self-pay | Admitting: *Deleted

## 2020-07-08 ENCOUNTER — Telehealth: Payer: Self-pay

## 2020-07-08 NOTE — Telephone Encounter (Signed)
Patient called to report small nickel size knot in her groin. Not expanding or painful. Explained this was fine, just keep an eye on it. She also reports more blackening and hardening of her toe and small water blister at site. Explained this sounded like a normal process after the aortogram as the dead tissue would separate. She will follow up with Dr. Arbie Cookey in January.

## 2020-07-12 ENCOUNTER — Other Ambulatory Visit: Payer: Self-pay

## 2020-07-12 ENCOUNTER — Telehealth: Payer: Self-pay

## 2020-07-12 NOTE — Telephone Encounter (Signed)
Patient having severe pain continuing in her L great toe. It is black and draining clear fluid. Called in #20 vicodin on 12/14 - f/u appt with [facility] in January s/p aogram on 12/9. Scheduled with [facility] tomorrow for evaulation

## 2020-07-13 ENCOUNTER — Other Ambulatory Visit: Payer: Self-pay

## 2020-07-13 ENCOUNTER — Ambulatory Visit (INDEPENDENT_AMBULATORY_CARE_PROVIDER_SITE_OTHER): Payer: BLUE CROSS/BLUE SHIELD | Admitting: Vascular Surgery

## 2020-07-13 ENCOUNTER — Encounter: Payer: Self-pay | Admitting: Vascular Surgery

## 2020-07-13 DIAGNOSIS — I739 Peripheral vascular disease, unspecified: Secondary | ICD-10-CM

## 2020-07-13 MED ORDER — HYDROCODONE-ACETAMINOPHEN 5-325 MG PO TABS: 1.0000 | ORAL_TABLET | ORAL | 0 refills | Status: DC | PRN

## 2020-07-13 NOTE — H&P (View-Only) (Signed)
Patient name: Tina Pham MRN: [id number] DOB: [birth year] Sex: female  REASON FOR VISIT: Triage visit for worsening toe discoloration and draining toe  HPI: Tina Pham is a 44 y.o. female that presents for triage visit to evaluate drainage and darkening of her left great toe that has been worsening.  She was initially seen by Dr. Arbie Cookey in [place] on 06/21/2020 with duskiness to her left great toe that had been progressive in the setting of worsening diabetes.  Ultimately she also described chronic left leg claudication. She was found to have a left iliac occlusion on CT.  On 07/01/2020, I performed angioplasty and stenting of her left common iliac artery for chronic total occlusion.  She states her leg feels much better when she walks.  All of her claudication has resolved.  She is taking dual antiplatelet therapy.  Past Medical History:  Diagnosis Date   Anxiety    Arthritis    Chronic back pain    Chronic bronchitis (HCC)    Depression    Diabetes mellitus    lost weight and is not on meds now.   High cholesterol    History of kidney stones    Hypertension    Migraine    Neuropathic pain of foot    Plantar fasciitis    Sciatica    Torn rotator cuff     Past Surgical History:  Procedure Laterality Date   ABDOMINAL AORTOGRAM W/LOWER EXTREMITY N/A 07/01/2020   Procedure: ABDOMINAL AORTOGRAM W/LOWER EXTREMITY;  Surgeon: Cephus Shelling, MD;  [facility]: [facility];  Service: Cardiovascular;  Laterality: N/A;   BACK SURGERY     herniated disc; had disc removed and then fusion; total of 3 surgeries   EYE SURGERY Bilateral    removal of cyst and straightening of muscles   KNEE ARTHROSCOPY WITH MEDIAL MENISECTOMY Left 12/27/2016   Procedure: LEFT KNEE DIAGNOSTIC ARTHROSCOPY;  Surgeon: Vickki Hearing, MD;  Location: [facility];  Service: Orthopedics;  Laterality: Left;   PERIPHERAL VASCULAR INTERVENTION Left 07/01/2020   Procedure:  PERIPHERAL VASCULAR INTERVENTION;  Surgeon: Cephus Shelling, MD;  Location: [facility];  Service: Cardiovascular;  Laterality: Left;  common iliac   SHOULDER OPEN ROTATOR CUFF REPAIR Right 09/19/2018   Procedure: ROTATOR CUFF REPAIR SHOULDER OPEN;  Surgeon: Vickki Hearing, MD;  Location: [facility];  Service: Orthopedics;  Laterality: Right;   SHOULDER OPEN ROTATOR CUFF REPAIR Right 08/05/2019   Procedure: ROTATOR CUFF REPAIR SHOULDER OPEN;  Surgeon: Vickki Hearing, MD;  Location: [facility];  Service: Orthopedics;  Laterality: Right;   TUBAL LIGATION      Family History  Problem Relation Age of Onset   Heart failure Mother    COPD Mother    Diabetes Mother    Heart failure Father    Diabetes Other     SOCIAL HISTORY: Social History   Tobacco Use   Smoking status: Current Every Day Smoker    Packs/day: 1.00    Years: 20.00    Pack years: 20.00    Types: Cigarettes   Smokeless tobacco: Never Used  Substance Use Topics   Alcohol use: No    Allergies  Allergen Reactions   Codeine Other (See Comments)    Upset Stomach   Benadryl [Diphenhydramine Hcl] Palpitations   Compazine Palpitations    Current Outpatient Medications  Medication Sig Dispense Refill   atorvastatin (LIPITOR) 10 MG tablet Take 1 tablet (10 mg total) by mouth daily. 30  tablet 11   clopidogrel (PLAVIX) 75 MG tablet Take 1 tablet (75 mg total) by mouth daily. 30 tablet 11   gabapentin (NEURONTIN) 300 MG capsule Take 1 capsule (300 mg total) by mouth 3 (three) times daily. 90 capsule 5   glipiZIDE (GLUCOTROL) 5 MG tablet Take 1 tablet (5 mg total) by mouth 2 (two) times daily. 60 tablet 11   doxycycline (VIBRA-TABS) 100 MG tablet Take 1 tablet (100 mg total) by mouth 2 (two) times daily. (Patient not taking: Reported on 07/13/2020) 20 tablet 0   HYDROcodone-acetaminophen (NORCO/VICODIN) 5-325 MG tablet Take 1 tablet by mouth every 6 (six) hours as needed for moderate pain.  (Patient not taking: Reported on 07/13/2020) 20 tablet 0   ibuprofen (ADVIL) 800 MG tablet Take 1 tablet (800 mg total) by mouth 3 (three) times daily. 90 tablet 5   No current facility-administered medications for this visit.    REVIEW OF SYSTEMS:  [X]  denotes positive finding, [ ]  denotes negative finding Cardiac  Comments:  Chest pain or chest pressure:    Shortness of breath upon exertion:    Short of breath when lying flat:    Irregular heart rhythm:        Vascular    Pain in calf, thigh, or hip brought on by ambulation:    Pain in feet at night that wakes you up from your sleep:     Blood clot in your veins:    Leg swelling:         Pulmonary    Oxygen at home:    Productive cough:     Wheezing:         Neurologic    Sudden weakness in arms or legs:     Sudden numbness in arms or legs:     Sudden onset of difficulty speaking or slurred speech:    Temporary loss of vision in one eye:     Problems with dizziness:         Gastrointestinal    Blood in stool:     Vomited blood:         Genitourinary    Burning when urinating:     Blood in urine:        Psychiatric    Major depression:         Hematologic    Bleeding problems:    Problems with blood clotting too easily:        Skin    Rashes or ulcers:        Constitutional    Fever or chills:      PHYSICAL EXAM: Vitals:   07/13/20 1106  BP: 129/81  Pulse: 93  Resp: 16  Temp: 97.9 F (36.6 C)  TempSrc: Temporal  SpO2: 98%  Weight: 219 lb (99.3 kg)  Height: 5\' 8"  (1.727 m)    GENERAL: The patient is a well-nourished female, in no acute distress. The vital signs are documented above. CARDIAC: There is a regular rate and rhythm.  VASCULAR:  Left DP and PT palpable Great toe dry gangrene at the tip       DATA:     Assessment/Plan:  79 old female now status post left common iliac artery angioplasty with stent placement for chronic total occlusion and tissue loss in the left great toe.  She has easily palpable dorsalis pedis and posterior tibial pulse. She was added on today for triage visit. No signs of infection and appears to be dry gangrene. Discussed option  of continued wound care with Betadine paint to the toe versus toe amputation. Ultimately she wants a left great toe amputation given she does not feel she can continue to tolerate the pain. I offered to schedule this tomorrow or Thursday and ultimately she wants this done next week with any available surgeon due to her work schedule. Hopefully this can be done as an outpatient. She will follow-up with Dr. Arbie Cookey in [place].   Cephus Shelling, MD [facility] Office: [phone]

## 2020-07-13 NOTE — Progress Notes (Signed)
Patient name: Tina Pham MRN: [id number] DOB: [birth year] Sex: female  REASON FOR VISIT: Triage visit for worsening toe discoloration and draining toe  HPI: Tina Pham is a 44 y.o. female that presents for triage visit to evaluate drainage and darkening of her left great toe that has been worsening.  She was initially seen by Dr. Arbie Cookey in [place] on 06/21/2020 with duskiness to her left great toe that had been progressive in the setting of worsening diabetes.  Ultimately she also described chronic left leg claudication. She was found to have a left iliac occlusion on CT.  On 07/01/2020, I performed angioplasty and stenting of her left common iliac artery for chronic total occlusion.  She states her leg feels much better when she walks.  All of her claudication has resolved.  She is taking dual antiplatelet therapy.  Past Medical History:  Diagnosis Date   Anxiety    Arthritis    Chronic back pain    Chronic bronchitis (HCC)    Depression    Diabetes mellitus    lost weight and is not on meds now.   High cholesterol    History of kidney stones    Hypertension    Migraine    Neuropathic pain of foot    Plantar fasciitis    Sciatica    Torn rotator cuff     Past Surgical History:  Procedure Laterality Date   ABDOMINAL AORTOGRAM W/LOWER EXTREMITY N/A 07/01/2020   Procedure: ABDOMINAL AORTOGRAM W/LOWER EXTREMITY;  Surgeon: Cephus Shelling, MD;  Location: [facility];  Service: Cardiovascular;  Laterality: N/A;   BACK SURGERY     herniated disc; had disc removed and then fusion; total of 3 surgeries   EYE SURGERY Bilateral    removal of cyst and straightening of muscles   KNEE ARTHROSCOPY WITH MEDIAL MENISECTOMY Left 12/27/2016   Procedure: LEFT KNEE DIAGNOSTIC ARTHROSCOPY;  Surgeon: Vickki Hearing, MD;  Location: [facility];  Service: Orthopedics;  Laterality: Left;   PERIPHERAL VASCULAR INTERVENTION Left 07/01/2020   Procedure:  PERIPHERAL VASCULAR INTERVENTION;  Surgeon: Cephus Shelling, MD;  Location: [facility];  Service: Cardiovascular;  Laterality: Left;  common iliac   SHOULDER OPEN ROTATOR CUFF REPAIR Right 09/19/2018   Procedure: ROTATOR CUFF REPAIR SHOULDER OPEN;  Surgeon: Vickki Hearing, MD;  Location: [facility];  Service: Orthopedics;  Laterality: Right;   SHOULDER OPEN ROTATOR CUFF REPAIR Right 08/05/2019   Procedure: ROTATOR CUFF REPAIR SHOULDER OPEN;  Surgeon: Vickki Hearing, MD;  Location: [facility];  Service: Orthopedics;  Laterality: Right;   TUBAL LIGATION      Family History  Problem Relation Age of Onset   Heart failure Mother    COPD Mother    Diabetes Mother    Heart failure Father    Diabetes Other     SOCIAL HISTORY: Social History   Tobacco Use   Smoking status: Current Every Day Smoker    Packs/day: 1.00    Years: 20.00    Pack years: 20.00    Types: Cigarettes   Smokeless tobacco: Never Used  Substance Use Topics   Alcohol use: No    Allergies  Allergen Reactions   Codeine Other (See Comments)    Upset Stomach   Benadryl [Diphenhydramine Hcl] Palpitations   Compazine Palpitations    Current Outpatient Medications  Medication Sig Dispense Refill   atorvastatin (LIPITOR) 10 MG tablet Take 1 tablet (10 mg total) by mouth daily. 30  tablet 11   clopidogrel (PLAVIX) 75 MG tablet Take 1 tablet (75 mg total) by mouth daily. 30 tablet 11   gabapentin (NEURONTIN) 300 MG capsule Take 1 capsule (300 mg total) by mouth 3 (three) times daily. 90 capsule 5   glipiZIDE (GLUCOTROL) 5 MG tablet Take 1 tablet (5 mg total) by mouth 2 (two) times daily. 60 tablet 11   doxycycline (VIBRA-TABS) 100 MG tablet Take 1 tablet (100 mg total) by mouth 2 (two) times daily. (Patient not taking: Reported on 07/13/2020) 20 tablet 0   HYDROcodone-acetaminophen (NORCO/VICODIN) 5-325 MG tablet Take 1 tablet by mouth every 6 (six) hours as needed for moderate pain.  (Patient not taking: Reported on 07/13/2020) 20 tablet 0   ibuprofen (ADVIL) 800 MG tablet Take 1 tablet (800 mg total) by mouth 3 (three) times daily. 90 tablet 5   No current facility-administered medications for this visit.    REVIEW OF SYSTEMS:  [X]  denotes positive finding, [ ]  denotes negative finding Cardiac  Comments:  Chest pain or chest pressure:    Shortness of breath upon exertion:    Short of breath when lying flat:    Irregular heart rhythm:        Vascular    Pain in calf, thigh, or hip brought on by ambulation:    Pain in feet at night that wakes you up from your sleep:     Blood clot in your veins:    Leg swelling:         Pulmonary    Oxygen at home:    Productive cough:     Wheezing:         Neurologic    Sudden weakness in arms or legs:     Sudden numbness in arms or legs:     Sudden onset of difficulty speaking or slurred speech:    Temporary loss of vision in one eye:     Problems with dizziness:         Gastrointestinal    Blood in stool:     Vomited blood:         Genitourinary    Burning when urinating:     Blood in urine:        Psychiatric    Major depression:         Hematologic    Bleeding problems:    Problems with blood clotting too easily:        Skin    Rashes or ulcers:        Constitutional    Fever or chills:      PHYSICAL EXAM: Vitals:   07/13/20 1106  BP: 129/81  Pulse: 93  Resp: 16  Temp: 97.9 F (36.6 C)  TempSrc: Temporal  SpO2: 98%  Weight: 219 lb (99.3 kg)  Height: 5\' 8"  (1.727 m)    GENERAL: The patient is a well-nourished female, in no acute distress. The vital signs are documented above. CARDIAC: There is a regular rate and rhythm.  VASCULAR:  Left DP and PT palpable Great toe dry gangrene at the tip       DATA:     Assessment/Plan:  79 old female now status post left common iliac artery angioplasty with stent placement for chronic total occlusion and tissue loss in the left great toe.  She has easily palpable dorsalis pedis and posterior tibial pulse. She was added on today for triage visit. No signs of infection and appears to be dry gangrene. Discussed option  of continued wound care with Betadine paint to the toe versus toe amputation. Ultimately she wants a left great toe amputation given she does not feel she can continue to tolerate the pain. I offered to schedule this tomorrow or Thursday and ultimately she wants this done next week with any available surgeon due to her work schedule. Hopefully this can be done as an outpatient. She will follow-up with Dr. Arbie Cookey in [place].   Cephus Shelling, MD [facility] Office: [phone]

## 2020-07-20 ENCOUNTER — Encounter (HOSPITAL_COMMUNITY): Payer: Self-pay | Admitting: Vascular Surgery

## 2020-07-20 ENCOUNTER — Other Ambulatory Visit: Payer: Self-pay

## 2020-07-20 NOTE — Progress Notes (Signed)
Ms Piechowski  Denies chest pain or shortness of breath.  Ms Dobratz denies any s/s of Covid or being in contact with anyone with Covid.  Ms Weldon has type II diabetes. Patient does not own a CBG machine.  I instructed patient to not take Glipizide the evening before surgery or the morning of surgery.

## 2020-07-21 ENCOUNTER — Other Ambulatory Visit
Admission: RE | Admit: 2020-07-21 | Discharge: 2020-07-21 | Disposition: A | Discharge status: Home / Self Care | Payer: BLUE CROSS/BLUE SHIELD | Source: Ambulatory Visit | Attending: Vascular Surgery | Admitting: Vascular Surgery

## 2020-07-21 DIAGNOSIS — Z20822 Contact with and (suspected) exposure to covid-19: Secondary | ICD-10-CM | POA: Insufficient documentation

## 2020-07-21 DIAGNOSIS — Z01812 Encounter for preprocedural laboratory examination: Secondary | ICD-10-CM | POA: Insufficient documentation

## 2020-07-21 LAB — SARS CORONAVIRUS 2 (TAT 6-24 HRS): SARS Coronavirus 2: NEGATIVE

## 2020-07-22 ENCOUNTER — Ambulatory Visit (HOSPITAL_COMMUNITY): Payer: BLUE CROSS/BLUE SHIELD | Admitting: Certified Registered"

## 2020-07-22 ENCOUNTER — Encounter (HOSPITAL_COMMUNITY): Payer: Self-pay | Admitting: Vascular Surgery

## 2020-07-22 ENCOUNTER — Other Ambulatory Visit: Payer: Self-pay

## 2020-07-22 ENCOUNTER — Ambulatory Visit (HOSPITAL_COMMUNITY)
Admission: RE | Admit: 2020-07-22 | Discharge: 2020-07-22 | Disposition: A | Discharge status: Home / Self Care | Payer: BLUE CROSS/BLUE SHIELD | Attending: Vascular Surgery | Admitting: Vascular Surgery

## 2020-07-22 ENCOUNTER — Encounter (HOSPITAL_COMMUNITY)
Admission: RE | Disposition: A | Discharge status: Home / Self Care | Payer: Self-pay | Source: Home / Self Care | Attending: Vascular Surgery

## 2020-07-22 DIAGNOSIS — Z7984 Long term (current) use of oral hypoglycemic drugs: Secondary | ICD-10-CM | POA: Insufficient documentation

## 2020-07-22 DIAGNOSIS — Z95820 Peripheral vascular angioplasty status with implants and grafts: Secondary | ICD-10-CM | POA: Diagnosis not present

## 2020-07-22 DIAGNOSIS — Z7902 Long term (current) use of antithrombotics/antiplatelets: Secondary | ICD-10-CM | POA: Insufficient documentation

## 2020-07-22 DIAGNOSIS — Z791 Long term (current) use of non-steroidal anti-inflammatories (NSAID): Secondary | ICD-10-CM | POA: Insufficient documentation

## 2020-07-22 DIAGNOSIS — E1152 Type 2 diabetes mellitus with diabetic peripheral angiopathy with gangrene: Secondary | ICD-10-CM | POA: Insufficient documentation

## 2020-07-22 DIAGNOSIS — I96 Gangrene, not elsewhere classified: Secondary | ICD-10-CM | POA: Diagnosis not present

## 2020-07-22 DIAGNOSIS — Z885 Allergy status to narcotic agent status: Secondary | ICD-10-CM | POA: Insufficient documentation

## 2020-07-22 HISTORY — PX: AMPUTATION: SHX166

## 2020-07-22 HISTORY — DX: Pneumonia, unspecified organism: J18.9

## 2020-07-22 HISTORY — DX: Gastro-esophageal reflux disease without esophagitis: K21.9

## 2020-07-22 LAB — POCT I-STAT, CHEM 8
BUN: 12 mg/dL (ref 6–20)
Calcium, Ion: 1.17 mmol/L (ref 1.15–1.40)
Chloride: 106 mmol/L (ref 98–111)
Creatinine, Ser: 0.4 mg/dL — ABNORMAL LOW (ref 0.44–1.00)
Glucose, Bld: 194 mg/dL — ABNORMAL HIGH (ref 70–99)
HCT: 29 % — ABNORMAL LOW (ref 36.0–46.0)
Hemoglobin: 9.9 g/dL — ABNORMAL LOW (ref 12.0–15.0)
Potassium: 3.9 mmol/L (ref 3.5–5.1)
Sodium: 138 mmol/L (ref 135–145)
TCO2: 22 mmol/L (ref 22–32)

## 2020-07-22 LAB — POCT PREGNANCY, URINE: Preg Test, Ur: NEGATIVE

## 2020-07-22 LAB — GLUCOSE, CAPILLARY
Glucose-Capillary: 228 mg/dL — ABNORMAL HIGH (ref 70–99)
Glucose-Capillary: 210 mg/dL — ABNORMAL HIGH (ref 70–99)
Glucose-Capillary: 239 mg/dL — ABNORMAL HIGH (ref 70–99)

## 2020-07-22 SURGERY — AMPUTATION DIGIT
Anesthesia: Regional | Site: Toe | Laterality: Left

## 2020-07-22 MED ORDER — CHLORHEXIDINE GLUCONATE 4 % EX LIQD: 60.0000 mL | Freq: Once | CUTANEOUS | Status: DC

## 2020-07-22 MED ORDER — LIDOCAINE 2% (20 MG/ML) 5 ML SYRINGE
INTRAMUSCULAR | Status: AC
  Filled 2020-07-22: qty 5

## 2020-07-22 MED ORDER — MIDAZOLAM HCL 2 MG/2ML IJ SOLN
INTRAMUSCULAR | Status: AC
  Administered 2020-07-22: 10:00:00 2 mg via INTRAVENOUS
  Filled 2020-07-22: qty 2

## 2020-07-22 MED ORDER — PROPOFOL 500 MG/50ML IV EMUL
INTRAVENOUS | Status: DC | PRN
  Administered 2020-07-22: 100 ug/kg/min via INTRAVENOUS

## 2020-07-22 MED ORDER — HYDROCODONE-ACETAMINOPHEN 5-325 MG PO TABS: 1.0000 | ORAL_TABLET | Freq: Four times a day (QID) | ORAL | 0 refills | Status: DC | PRN

## 2020-07-22 MED ORDER — LIDOCAINE HCL (CARDIAC) PF 100 MG/5ML IV SOSY
PREFILLED_SYRINGE | INTRAVENOUS | Status: DC | PRN
  Administered 2020-07-22: 50 mg via INTRAVENOUS

## 2020-07-22 MED ORDER — LACTATED RINGERS IV SOLN: INTRAVENOUS | Status: DC

## 2020-07-22 MED ORDER — ORAL CARE MOUTH RINSE: 15.0000 mL | Freq: Once | OROMUCOSAL | Status: AC

## 2020-07-22 MED ORDER — FENTANYL CITRATE (PF) 100 MCG/2ML IJ SOLN: 50.0000 ug | Freq: Once | INTRAMUSCULAR | Status: AC

## 2020-07-22 MED ORDER — PROPOFOL 10 MG/ML IV BOLUS
INTRAVENOUS | Status: AC
  Filled 2020-07-22: qty 20

## 2020-07-22 MED ORDER — FENTANYL CITRATE (PF) 100 MCG/2ML IJ SOLN
INTRAMUSCULAR | Status: AC
  Administered 2020-07-22: 10:00:00 50 ug via INTRAVENOUS
  Filled 2020-07-22: qty 2

## 2020-07-22 MED ORDER — CEFAZOLIN SODIUM-DEXTROSE 2-4 GM/100ML-% IV SOLN
2.0000 g | INTRAVENOUS | Status: AC
  Administered 2020-07-22: 12:00:00 2 g via INTRAVENOUS

## 2020-07-22 MED ORDER — LIDOCAINE HCL (PF) 1 % IJ SOLN
INTRAMUSCULAR | Status: AC
  Filled 2020-07-22: qty 30

## 2020-07-22 MED ORDER — LACTATED RINGERS IV SOLN: INTRAVENOUS | Status: DC | PRN

## 2020-07-22 MED ORDER — ONDANSETRON HCL 4 MG/2ML IJ SOLN: 4.0000 mg | Freq: Once | INTRAMUSCULAR | Status: DC | PRN

## 2020-07-22 MED ORDER — DEXAMETHASONE SODIUM PHOSPHATE 4 MG/ML IJ SOLN
INTRAMUSCULAR | Status: DC | PRN
  Administered 2020-07-22: 5 mg via PERINEURAL

## 2020-07-22 MED ORDER — CEFAZOLIN SODIUM-DEXTROSE 2-4 GM/100ML-% IV SOLN
INTRAVENOUS | Status: AC
  Filled 2020-07-22: qty 100

## 2020-07-22 MED ORDER — FENTANYL CITRATE (PF) 100 MCG/2ML IJ SOLN
25.0000 ug | INTRAMUSCULAR | Status: DC | PRN
Start: 2020-07-22 — End: 2020-07-22

## 2020-07-22 MED ORDER — 0.9 % SODIUM CHLORIDE (POUR BTL) OPTIME
TOPICAL | Status: DC | PRN
  Administered 2020-07-22: 12:00:00 1000 mL

## 2020-07-22 MED ORDER — MIDAZOLAM HCL 2 MG/2ML IJ SOLN: 2.0000 mg | Freq: Once | INTRAMUSCULAR | Status: AC

## 2020-07-22 MED ORDER — SODIUM CHLORIDE 0.9 % IV SOLN: INTRAVENOUS | Status: DC

## 2020-07-22 MED ORDER — CLONIDINE HCL (ANALGESIA) 100 MCG/ML EP SOLN
EPIDURAL | Status: DC | PRN
  Administered 2020-07-22: 100 ug

## 2020-07-22 MED ORDER — PROPOFOL 1000 MG/100ML IV EMUL
INTRAVENOUS | Status: AC
  Filled 2020-07-22: qty 100

## 2020-07-22 MED ORDER — CHLORHEXIDINE GLUCONATE 0.12 % MT SOLN
OROMUCOSAL | Status: AC
  Administered 2020-07-22: 09:00:00 15 mL via OROMUCOSAL
  Filled 2020-07-22: qty 15

## 2020-07-22 MED ORDER — CHLORHEXIDINE GLUCONATE 0.12 % MT SOLN: 15.0000 mL | Freq: Once | OROMUCOSAL | Status: AC

## 2020-07-22 MED ORDER — DEXMEDETOMIDINE (PRECEDEX) IN NS 20 MCG/5ML (4 MCG/ML) IV SYRINGE
PREFILLED_SYRINGE | INTRAVENOUS | Status: DC | PRN
  Administered 2020-07-22 (×3): 4 ug via INTRAVENOUS
  Administered 2020-07-22: 8 ug via INTRAVENOUS

## 2020-07-22 MED ORDER — BUPIVACAINE-EPINEPHRINE (PF) 0.5% -1:200000 IJ SOLN
INTRAMUSCULAR | Status: DC | PRN
  Administered 2020-07-22: 30 mL via PERINEURAL

## 2020-07-22 SURGICAL SUPPLY — 29 items
BLADE AVERAGE 25X9 (BLADE) IMPLANT
BLADE SAW SGTL 81X20 HD (BLADE) IMPLANT
BNDG ELASTIC 4X5.8 VLCR STR LF (GAUZE/BANDAGES/DRESSINGS) ×2 IMPLANT
BNDG GAUZE ELAST 4 BULKY (GAUZE/BANDAGES/DRESSINGS) ×2 IMPLANT
CANISTER SUCT 3000ML PPV (MISCELLANEOUS) ×2 IMPLANT
COVER SURGICAL LIGHT HANDLE (MISCELLANEOUS) ×2 IMPLANT
COVER WAND RF STERILE (DRAPES) IMPLANT
DRAPE EXTREMITY T 121X128X90 (DISPOSABLE) ×2 IMPLANT
DRAPE HALF SHEET 40X57 (DRAPES) ×2 IMPLANT
DRSG ADAPTIC 3X8 NADH LF (GAUZE/BANDAGES/DRESSINGS) ×2 IMPLANT
ELECT REM PT RETURN 9FT ADLT (ELECTROSURGICAL) ×2
ELECTRODE REM PT RTRN 9FT ADLT (ELECTROSURGICAL) ×1 IMPLANT
GAUZE SPONGE 4X4 12PLY STRL (GAUZE/BANDAGES/DRESSINGS) ×2 IMPLANT
GLOVE BIO SURGEON STRL SZ7.5 (GLOVE) ×2 IMPLANT
GOWN STRL REUS W/ TWL LRG LVL3 (GOWN DISPOSABLE) ×2 IMPLANT
GOWN STRL REUS W/ TWL XL LVL3 (GOWN DISPOSABLE) ×1 IMPLANT
GOWN STRL REUS W/TWL LRG LVL3 (GOWN DISPOSABLE) ×4
GOWN STRL REUS W/TWL XL LVL3 (GOWN DISPOSABLE) ×2
KIT BASIN OR (CUSTOM PROCEDURE TRAY) ×2 IMPLANT
KIT TURNOVER KIT B (KITS) ×2 IMPLANT
NEEDLE HYPO 25GX1X1/2 BEV (NEEDLE) IMPLANT
NS IRRIG 1000ML POUR BTL (IV SOLUTION) ×2 IMPLANT
PACK GENERAL/GYN (CUSTOM PROCEDURE TRAY) ×2 IMPLANT
PAD ARMBOARD 7.5X6 YLW CONV (MISCELLANEOUS) ×4 IMPLANT
SUT ETHILON 3 0 PS 1 (SUTURE) ×2 IMPLANT
SYR CONTROL 10ML LL (SYRINGE) IMPLANT
TOWEL GREEN STERILE (TOWEL DISPOSABLE) ×4 IMPLANT
UNDERPAD 30X36 HEAVY ABSORB (UNDERPADS AND DIAPERS) ×2 IMPLANT
WATER STERILE IRR 1000ML POUR (IV SOLUTION) ×2 IMPLANT

## 2020-07-22 NOTE — Progress Notes (Signed)
Orthopedic Tech Progress Note Patient Details:  Tina Pham [facility] Oct 18, 1975 [id number]  Ortho Devices Type of Ortho Device: Darco shoe,Crutches Ortho Device/Splint Location: LLE Ortho Device/Splint Interventions: Ordered,Application,Adjustment   Post Interventions Patient Tolerated: Well Instructions Provided: Care of device,Adjustment of device,Poper ambulation with device   Tina Pham 07/22/2020, 2:10 PM

## 2020-07-22 NOTE — Interval H&P Note (Signed)
History and Physical Interval Note:  07/22/2020 11:20 AM  Tina Pham  has presented today for surgery, with the diagnosis of GANGRENE OF TOE.  The various methods of treatment have been discussed with the patient and family. After consideration of risks, benefits and other options for treatment, the patient has consented to  Procedure(s): LEFT GREAT TOE AMPUTATION (Left) as a surgical intervention.  The patient's history has been reviewed, patient examined, no change in status, stable for surgery.  I have reviewed the patient's chart and labs.  Questions were answered to the patient's satisfaction.     [name]

## 2020-07-22 NOTE — Op Note (Signed)
    Patient name: Tina Pham MRN: [id number] DOB: [birth year] Sex: female  07/22/2020 Pre-operative Diagnosis: gangrene left great toe Post-operative diagnosis:  Same Surgeon:  Apolinar Junes C. Randie Heinz, MD Procedure Performed:  Amputation left great toe  Indications: 44 year old female with history of left common iliac artery stenting.  Now she has palpable left dorsalis pedis pulse.  She has progressive gangrenous changes of her left great toe.  She is indicated for amputation.  Findings: There is a palpable dorsalis pedis pulse.  There was adequate capillary bleeding in the wound bed.  Skin was reapproximated at completion.   Procedure:  The patient was identified in the holding area and taken to the operating room where she is placed supine on the operating table.  Preoperative block had been performed.  Antibiotics were administered timeout was called.  Block was checked noted to be intact.  [place] type incision was made around the toe.  We dissected back to the metatarsal phalangeal joint toe was removed en bloc.  We irrigated obtain hemostasis.  We remove the first metatarsal joint capsule smoothed this with rasp irrigated again and closed the skin layer with 3-0 nylon suture.  She was awakened from anesthesia having tolerated procedure without any complication.  Counts were correct at completion.  EBL: 10 cc  Maicol Bowland C. Randie Heinz, MD [facility] [place] Office: [phone] Pager: [phone]

## 2020-07-22 NOTE — Anesthesia Postprocedure Evaluation (Signed)
Anesthesia Post Note  Patient: Tina Pham  Procedure(s) Performed: LEFT GREAT TOE AMPUTATION (Left Toe)     Patient location during evaluation: PACU Anesthesia Type: Regional Level of consciousness: awake and alert Pain management: pain level controlled Vital Signs Assessment: post-procedure vital signs reviewed and stable Respiratory status: spontaneous breathing Cardiovascular status: stable Anesthetic complications: no   No complications documented.  Last Vitals:  Vitals:   07/22/20 1300 07/22/20 1315  BP: (!) 110/58 115/63  Pulse: 70 70  Resp: 16 14  Temp:  36.5 C  SpO2: 95% 94%    Last Pain:  Vitals:   07/22/20 1300  TempSrc:   PainSc: 0-No pain                 Lewie Loron

## 2020-07-22 NOTE — Anesthesia Procedure Notes (Addendum)
Anesthesia Regional Block: Popliteal block   Pre-Anesthetic Checklist: ,, timeout performed, Correct Patient, Correct Site, Correct Laterality, Correct Procedure, Correct Position, site marked, Risks and benefits discussed,  Surgical consent,  Pre-op evaluation,  At surgeon's request and post-op pain management  Laterality: Left  Prep: chloraprep       Needles:  Injection technique: Single-shot  Needle Type: Stimiplex     Needle Length: 10cm  Needle Gauge: 21     Additional Needles:   Procedures:,,,, ultrasound used (permanent image in chart),,,,  Motor weakness within 5 minutes.   Nerve Stimulator or Paresthesia:  Response: 0.5 mA,   Additional Responses:   Narrative:  Start time: 07/22/2020 10:00 AM End time: 07/22/2020 10:05 AM Injection made incrementally with aspirations every 5 mL.  Performed by: Personally  Anesthesiologist: Lewie Loron, MD  Additional Notes: Nerve located and needle positioned with direct ultrasound guidance. Good perineural spread. Patient tolerated well.

## 2020-07-22 NOTE — Transfer of Care (Signed)
Immediate Anesthesia Transfer of Care Note  Patient: Tina Pham  Procedure(s) Performed: LEFT GREAT TOE AMPUTATION (Left Toe)  Patient Location: PACU  Anesthesia Type:MAC and Regional  Level of Consciousness: awake, alert  and oriented  Airway & Oxygen Therapy: Patient Spontanous Breathing  Post-op Assessment: Report given to RN and Post -op Vital signs reviewed and stable  Post vital signs: Reviewed and stable  Last Vitals:  Vitals Value Taken Time  BP 81/51 07/22/20 1231  Temp    Pulse 67 07/22/20 1233  Resp 16 07/22/20 1233  SpO2 95 % 07/22/20 1233  Vitals shown include unvalidated device data.  Last Pain:  Vitals:   07/22/20 1017  TempSrc:   PainSc: 5       Patients Stated Pain Goal: 3 (14/43/15 4008)  Complications: No complications documented.

## 2020-07-22 NOTE — Anesthesia Procedure Notes (Signed)
Procedure Name: MAC Date/Time: 07/22/2020 11:58 AM Performed by: Oletta Lamas, CRNA Pre-anesthesia Checklist: Patient identified, Emergency Drugs available, Suction available, Patient being monitored and Timeout performed Patient Re-evaluated:Patient Re-evaluated prior to induction Oxygen Delivery Method: Simple face mask

## 2020-07-22 NOTE — Anesthesia Preprocedure Evaluation (Addendum)
Anesthesia Evaluation  Patient identified by MRN, date of birth, ID band Patient awake    Reviewed: Allergy & Precautions, NPO status , Patient's Chart, lab work & pertinent test results  Airway Mallampati: II  TM Distance: >3 FB Neck ROM: Full    Dental  (+) Edentulous Upper, Edentulous Lower   Pulmonary pneumonia, Current Smoker and Patient abstained from smoking.,    Pulmonary exam normal breath sounds clear to auscultation       Cardiovascular hypertension, + Peripheral Vascular Disease  Normal cardiovascular exam Rhythm:Regular Rate:Normal     Neuro/Psych  Headaches, PSYCHIATRIC DISORDERS Anxiety Depression    GI/Hepatic Neg liver ROS, GERD  ,  Endo/Other  diabetes  Renal/GU negative Renal ROS     Musculoskeletal  (+) Arthritis ,   Abdominal (+) + obese,   Peds  Hematology negative hematology ROS (+)   Anesthesia Other Findings   Reproductive/Obstetrics negative OB ROS                           Anesthesia Physical Anesthesia Plan  ASA: III  Anesthesia Plan: Regional   Post-op Pain Management:    Induction: Intravenous  PONV Risk Score and Plan: Propofol infusion, TIVA, Treatment may vary due to age or medical condition, Midazolam and Ondansetron  Airway Management Planned: Natural Airway  Additional Equipment: None  Intra-op Plan:   Post-operative Plan:   Informed Consent: I have reviewed the patients History and Physical, chart, labs and discussed the procedure including the risks, benefits and alternatives for the proposed anesthesia with the patient or authorized representative who has indicated his/her understanding and acceptance.     Dental advisory given  Plan Discussed with: CRNA  Anesthesia Plan Comments:        Anesthesia Quick Evaluation

## 2020-07-23 ENCOUNTER — Encounter (HOSPITAL_COMMUNITY): Payer: Self-pay | Admitting: Vascular Surgery

## 2020-07-26 ENCOUNTER — Other Ambulatory Visit (INDEPENDENT_AMBULATORY_CARE_PROVIDER_SITE_OTHER): Payer: Self-pay

## 2020-07-27 ENCOUNTER — Other Ambulatory Visit (INDEPENDENT_AMBULATORY_CARE_PROVIDER_SITE_OTHER): Payer: Self-pay

## 2020-08-02 ENCOUNTER — Encounter: Payer: BLUE CROSS/BLUE SHIELD | Admitting: Vascular Surgery

## 2020-08-02 ENCOUNTER — Other Ambulatory Visit (INDEPENDENT_AMBULATORY_CARE_PROVIDER_SITE_OTHER): Payer: Self-pay

## 2020-08-06 ENCOUNTER — Other Ambulatory Visit (INDEPENDENT_AMBULATORY_CARE_PROVIDER_SITE_OTHER): Payer: Self-pay

## 2020-08-09 ENCOUNTER — Encounter: Payer: BLUE CROSS/BLUE SHIELD | Admitting: Vascular Surgery

## 2020-08-10 ENCOUNTER — Other Ambulatory Visit: Payer: Self-pay

## 2020-08-11 ENCOUNTER — Other Ambulatory Visit: Payer: Self-pay | Admitting: Orthopedic Surgery

## 2020-08-11 DIAGNOSIS — M542 Cervicalgia: Secondary | ICD-10-CM

## 2020-08-12 MED ORDER — HYDROCODONE-ACETAMINOPHEN 5-325 MG PO TABS: 1.0000 | ORAL_TABLET | Freq: Four times a day (QID) | ORAL | 0 refills | Status: DC | PRN

## 2020-08-13 ENCOUNTER — Other Ambulatory Visit: Payer: Self-pay

## 2020-08-13 ENCOUNTER — Ambulatory Visit (INDEPENDENT_AMBULATORY_CARE_PROVIDER_SITE_OTHER): Payer: Self-pay | Admitting: Vascular Surgery

## 2020-08-13 ENCOUNTER — Encounter: Payer: Self-pay | Admitting: Vascular Surgery

## 2020-08-13 ENCOUNTER — Encounter: Payer: BLUE CROSS/BLUE SHIELD | Admitting: Vascular Surgery

## 2020-08-13 VITALS — BP 135/83 | HR 87 | Temp 98.0°F | Resp 20 | Ht 68.0 in | Wt 220.0 lb

## 2020-08-13 DIAGNOSIS — I739 Peripheral vascular disease, unspecified: Secondary | ICD-10-CM

## 2020-08-13 NOTE — Progress Notes (Signed)
    Subjective:     Patient ID: Tina Pham, female   DOB: Sep 24, 1975, 45 y.o.   MRN: [id number]  HPI 45 year old female presented with left great toe gangrene underwent stenting of her left common iliac artery was found to have palpable dorsalis pedis pulse and then underwent left great toe amputation.  She has healed well from this.  She continues to have pain she was sent a prescription for Percocet yesterday.  She is healed well.  Overall she is hoping to get back to work.   Review of Systems No complaints other than pain at the amputation site with occasional numbness and tingling    Objective:   Physical Exam Vitals:   08/13/20 1537  BP: 135/83  Pulse: 87  Resp: 20  Temp: 98 F (36.7 C)  SpO2: 100%   Awake alert oriented Nonlabored respirations Abdomen is soft Left dorsalis pedis pulses palpable Left great toe amputation site healing well sutures removed at bedside    Assessment:     45 year old female status post left common iliac artery stenting and left great toe amputation.    Plan:     Sutures removed today Okay to return to work on Monday Follow-up in 6 to 8 weeks in [place] with aortoiliac duplex and ABIs.  Farhaan Mabee C. Randie Heinz, MD [facility] [place] Office: [phone] Pager: [phone]

## 2020-08-16 ENCOUNTER — Other Ambulatory Visit: Payer: Self-pay

## 2020-08-16 DIAGNOSIS — I739 Peripheral vascular disease, unspecified: Secondary | ICD-10-CM

## 2020-08-17 ENCOUNTER — Ambulatory Visit: Payer: BLUE CROSS/BLUE SHIELD

## 2020-08-17 ENCOUNTER — Ambulatory Visit (INDEPENDENT_AMBULATORY_CARE_PROVIDER_SITE_OTHER): Payer: BLUE CROSS/BLUE SHIELD | Admitting: Orthopaedic Surgery

## 2020-08-17 ENCOUNTER — Other Ambulatory Visit: Payer: Self-pay

## 2020-08-17 ENCOUNTER — Encounter: Payer: Self-pay | Admitting: Orthopaedic Surgery

## 2020-08-17 VITALS — BP 153/95 | HR 95 | Ht 68.0 in | Wt 220.0 lb

## 2020-08-17 DIAGNOSIS — Z9889 Other specified postprocedural states: Secondary | ICD-10-CM | POA: Diagnosis not present

## 2020-08-17 DIAGNOSIS — W19XXXA Unspecified fall, initial encounter: Secondary | ICD-10-CM | POA: Diagnosis not present

## 2020-08-17 DIAGNOSIS — M25511 Pain in right shoulder: Secondary | ICD-10-CM | POA: Diagnosis not present

## 2020-08-17 MED ORDER — HYDROCODONE-ACETAMINOPHEN 5-325 MG PO TABS: ORAL_TABLET | ORAL | 0 refills | Status: DC

## 2020-08-17 NOTE — Progress Notes (Signed)
Patient Tina Pham, female DOB:Apr 08, 1976, 45 y.o. WNU:[id number]  Chief Complaint  Patient presents with  . Shoulder Injury    Pt fell landing on Rt shoulder DOI  08/15/20    HPI  Tina Pham is a 45 y.o. female who fell at home on 08-15-20 and hurt her right shoulder.  She has history of right shoulder pain and rotator cuff pain.  I last saw her for this about two years ago.    She had no head injury.  She has pain with motion of the right shoulder.  She has some tingling of the right hand at times but no numbness.  She has pain sleeping on the shoulder.  She has tried rest, ice, Advil with no help.   Body mass index is 33.45 kg/m.  ROS  Review of Systems  Constitutional:       Patient has Diabetes Mellitus. Patient does not have hypertension. Patient does not have COPD or shortness of breath. Patient has BMI > 35. Patient has current smoking history.  HENT: Negative for congestion.   Respiratory: Negative for cough and shortness of breath.   Cardiovascular: Negative for chest pain.  Endocrine: Negative for cold intolerance.  Musculoskeletal: Positive for arthralgias, back pain and myalgias.  Allergic/Immunologic: Negative for environmental allergies.  Psychiatric/Behavioral: The patient is nervous/anxious.     All other systems reviewed and are negative.  The following is a summary of the past history medically, past history surgically, known current medicines, social history and family history.  This information is gathered electronically by the computer from prior information and documentation.  I review this each visit and have found including this information at this point in the chart is beneficial and informative.    Past Medical History:  Diagnosis Date  . Anxiety    panic attacks  . Arthritis   . Chronic back pain   . Chronic bronchitis (HCC)   . Depression   . Diabetes mellitus   . GERD (gastroesophageal reflux disease)    prn tums  .  High cholesterol   . History of kidney stones    passed ?  Marland Kitchen Hypertension    no longer on BP medications  . Migraine   . Neuropathic pain of foot   . Plantar fasciitis   . Pneumonia 2011ish  . Sciatica   . Torn rotator cuff     Past Surgical History:  Procedure Laterality Date  . ABDOMINAL AORTOGRAM W/LOWER EXTREMITY N/A 07/01/2020   Procedure: ABDOMINAL AORTOGRAM W/LOWER EXTREMITY;  Surgeon: Cephus Shelling, MD;  Location: [facility];  Service: Cardiovascular;  Laterality: N/A;  . AMPUTATION Left 07/22/2020   Procedure: LEFT GREAT TOE AMPUTATION;  Surgeon: Maeola Harman, MD;  Location: [facility] OR;  Service: Vascular;  Laterality: Left;  . BACK SURGERY     herniated disc; had disc removed and then fusion; total of 3 surgeries  . EYE SURGERY Bilateral    removal of cyst and straightening of muscles  . KNEE ARTHROSCOPY WITH MEDIAL MENISECTOMY Left 12/27/2016   Procedure: LEFT KNEE DIAGNOSTIC ARTHROSCOPY;  Surgeon: Vickki Hearing, MD;  Location: [facility];  Service: Orthopedics;  Laterality: Left;  . PERIPHERAL VASCULAR INTERVENTION Left 07/01/2020   Procedure: PERIPHERAL VASCULAR INTERVENTION;  Surgeon: Cephus Shelling, MD;  Location: [facility] INVASIVE CV LAB;  Service: Cardiovascular;  Laterality: Left;  common iliac  . SHOULDER OPEN ROTATOR CUFF REPAIR Right 09/19/2018   Procedure: ROTATOR CUFF REPAIR SHOULDER OPEN;  Surgeon: Romeo Apple,  Fernande Boyden, MD;  Location: [facility];  Service: Orthopedics;  Laterality: Right;  . SHOULDER OPEN ROTATOR CUFF REPAIR Right 08/05/2019   Procedure: ROTATOR CUFF REPAIR SHOULDER OPEN;  Surgeon: Vickki Hearing, MD;  Location: [facility];  Service: Orthopedics;  Laterality: Right;  . TUBAL LIGATION      Family History  Problem Relation Age of Onset  . Heart failure Mother   . COPD Mother   . Diabetes Mother   . Heart failure Father   . Diabetes Other     Social History Social History   Tobacco Use  . Smoking status: Current Every  Day Smoker    Packs/day: 0.50    Years: 31.00    Pack years: 15.50    Types: Cigarettes  . Smokeless tobacco: Never Used  Vaping Use  . Vaping Use: Never used  Substance Use Topics  . Alcohol use: No  . Drug use: No    Allergies  Allergen Reactions  . Codeine Other (See Comments)    Upset Stomach  . Metformin Other (See Comments)    Will not take per MD advisement   . Benadryl [Diphenhydramine Hcl] Palpitations  . Compazine Palpitations    Current Outpatient Medications  Medication Sig Dispense Refill  . aspirin EC 81 MG tablet Take 81 mg by mouth daily. Swallow whole.    Marland Kitchen atorvastatin (LIPITOR) 10 MG tablet Take 1 tablet (10 mg total) by mouth daily. 30 tablet 11  . clopidogrel (PLAVIX) 75 MG tablet Take 1 tablet (75 mg total) by mouth daily. 30 tablet 11  . gabapentin (NEURONTIN) 300 MG capsule TAKE 1 CAPSULE(300 MG) BY MOUTH THREE TIMES DAILY 90 capsule 5  . glipiZIDE (GLUCOTROL) 5 MG tablet Take 1 tablet (5 mg total) by mouth 2 (two) times daily. 60 tablet 11  . HYDROcodone-acetaminophen (NORCO/VICODIN) 5-325 MG tablet Take 1 tablet by mouth every 6 (six) hours as needed for moderate pain. 20 tablet 0  . ibuprofen (ADVIL) 200 MG tablet Take 800 mg by mouth every 8 (eight) hours as needed (for pain.).     No current facility-administered medications for this visit.     Physical Exam  Blood pressure (!) 153/95, pulse 95, height 5\' 8"  (1.727 m), weight 220 lb (99.8 kg).  Constitutional: overall normal hygiene, normal nutrition, well developed, normal grooming, normal body habitus. Assistive device:none  Musculoskeletal: gait and station Limp none, muscle tone and strength are normal, no tremors or atrophy is present.  .  Neurological: coordination overall normal.  Deep tendon reflex/nerve stretch intact.  Sensation normal.  Cranial nerves II-XII intact.   Skin:   Normal overall no scars, lesions, ulcers or rashes. No psoriasis.  Psychiatric: Alert and oriented x 3.   Recent memory intact, remote memory unclear.  Normal mood and affect. Well groomed.  Good eye contact.  Cardiovascular: overall no swelling, no varicosities, no edema bilaterally, normal temperatures of the legs and arms, no clubbing, cyanosis and good capillary refill.  Lymphatic: palpation is normal.  Right shoulder has very limited motion secondary to pain.   Grips are normal.  ROM of the elbow and hand on the right is normal.  Neck motion is normal.  All other systems reviewed and are negative   The patient has been educated about the nature of the problem(s) and counseled on treatment options.  The patient appeared to understand what I have discussed and is in agreement with it.  Encounter Diagnoses  Name Primary?  . Acute pain of  right shoulder Yes  . S/P right rotator cuff repair    X-rays were done of the right shoulder, reported separately.  PROCEDURE NOTE:  The patient request injection, verbal consent was obtained.  The right shoulder was prepped appropriately after time out was performed.   Sterile technique was observed and injection of 1 cc of Depo-Medrol 40 mg with several cc's of plain xylocaine. Anesthesia was provided by ethyl chloride and a 20-gauge needle was used to inject the shoulder area. A posterior approach was used.  The injection was tolerated well.  A band aid dressing was applied.  The patient was advised to apply ice later today and tomorrow to the injection sight as needed.    PLAN Call if any problems.  Precautions discussed.  Continue current medications. Begin Norco.  Return to clinic 1 week   A sling is provided.  She may need new MRI.  I have reviewed the [place] Controlled Substance Reporting System web site prior to prescribing narcotic medicine for this patient.   Electronically Signed Darreld Mclean, MD 1/25/20229:35 AM

## 2020-08-19 IMAGING — MR MR SHOULDER*R* W/O CM
4 of 5 series · 19 of 40 positions shown · non-contrast
Comparison: None.

CLINICAL DATA: Chronic right shoulder pain.

EXAM:
MRI OF THE RIGHT SHOULDER WITHOUT CONTRAST
TECHNIQUE: Multiplanar, multisequence MR imaging of the shoulder was performed.
No intravenous contrast was administered.

[Series 3: pdfs axial · axial · 4.0mm · 0.26mm/px · z∈[-14,+62]mm · 4 of 20 slices shown]
[im 1/20]
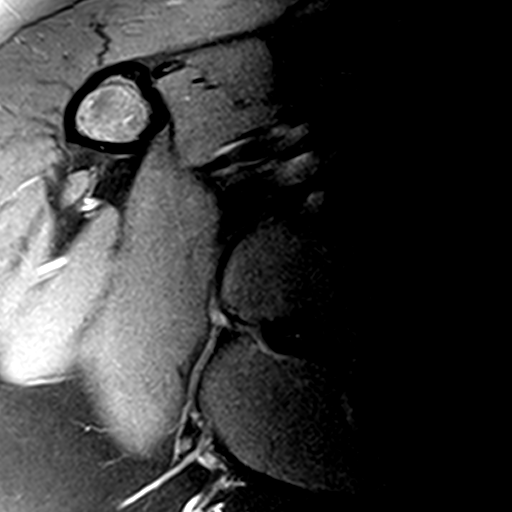
[im 3/20]
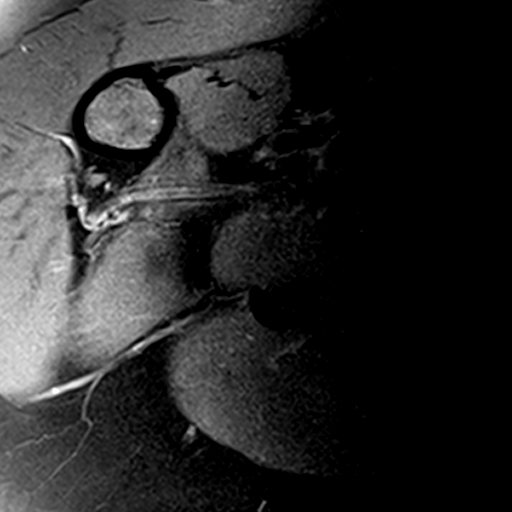
[im 11/20]
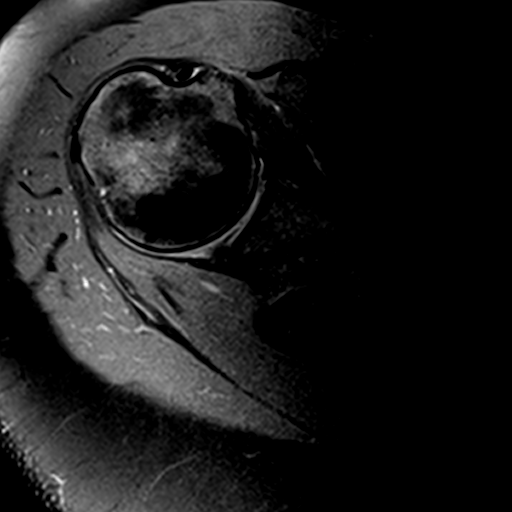
[im 17/20]
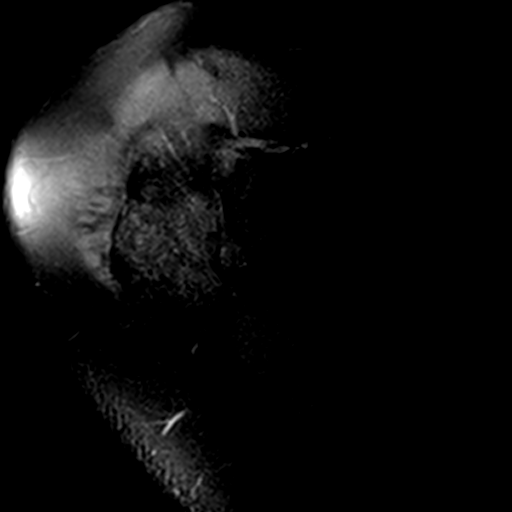

[Series 4: t2fs coronal · oblique · 4.0mm · 0.27mm/px · 3 of 20 slices shown]
[im 4/20]
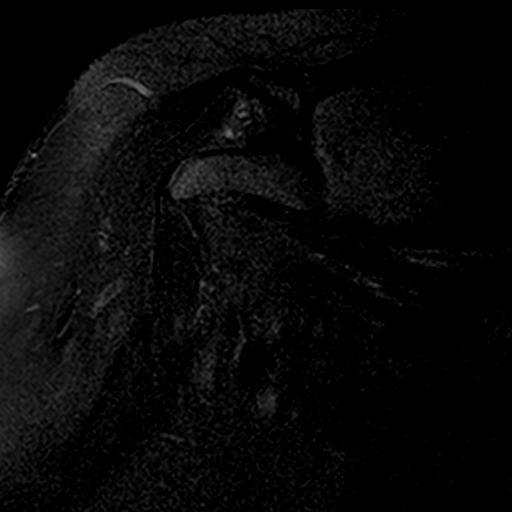
[im 10/20]
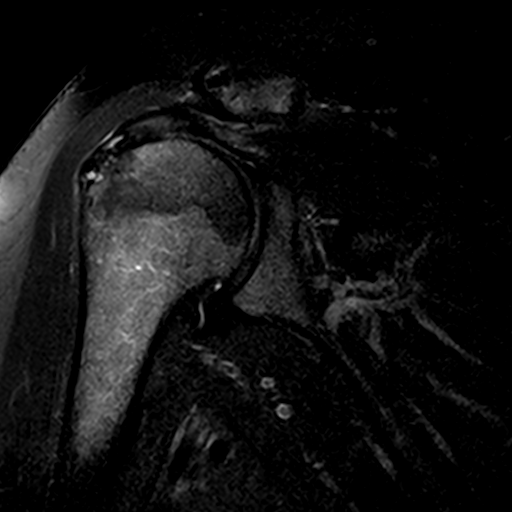
[im 16/20]
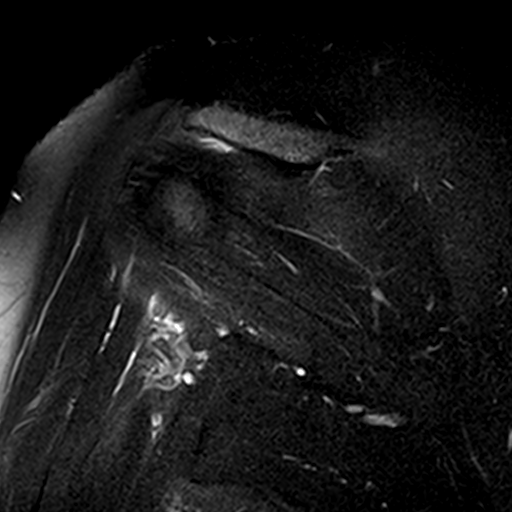

[Series 5: pdfs coronal · oblique · 4.0mm · 0.27mm/px · 3 of 20 slices shown]
[im 4/20]
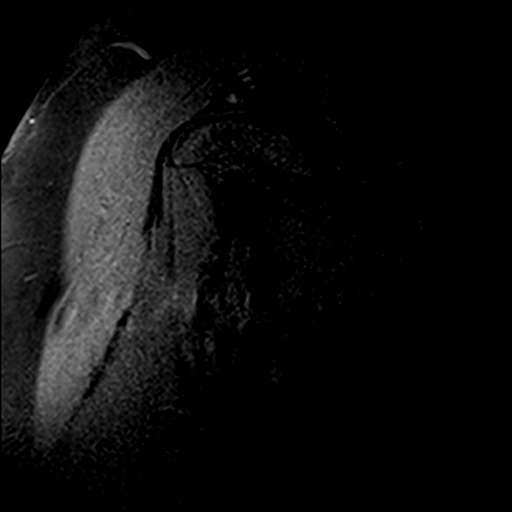
[im 10/20]
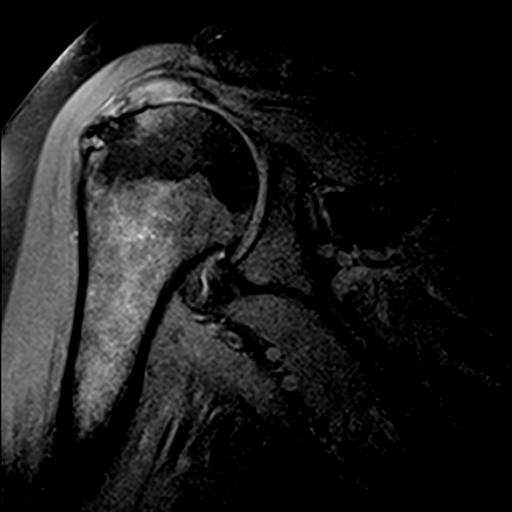
[im 16/20]
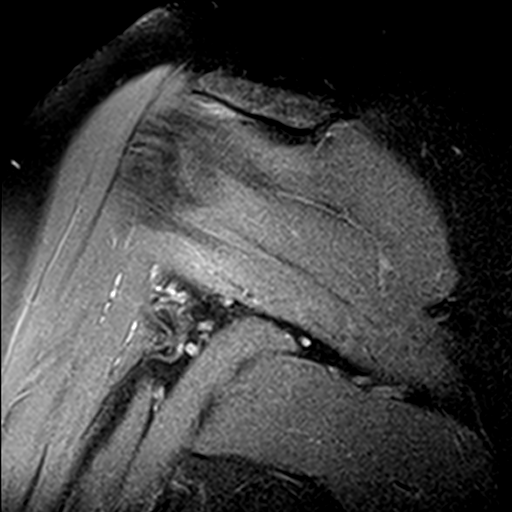

[Series 6: T1 · oblique · 4.0mm · 0.27mm/px · 9 of 24 slices shown]
[im 1/24]
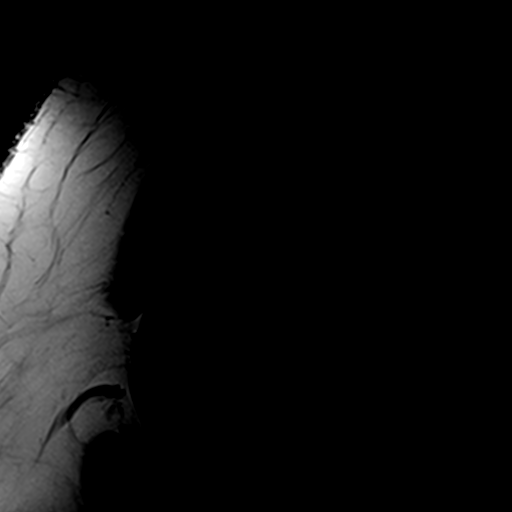
[im 3/24]
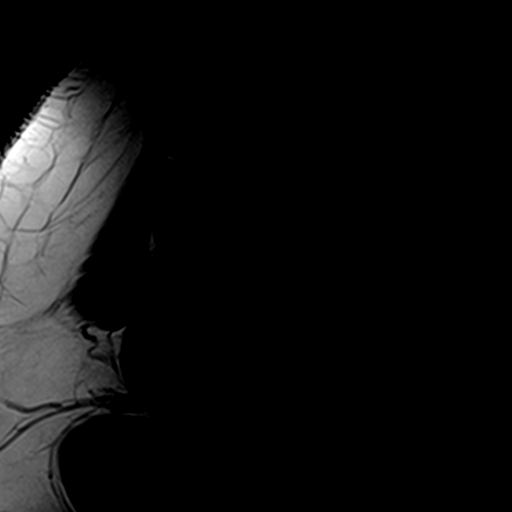
[im 6/24]
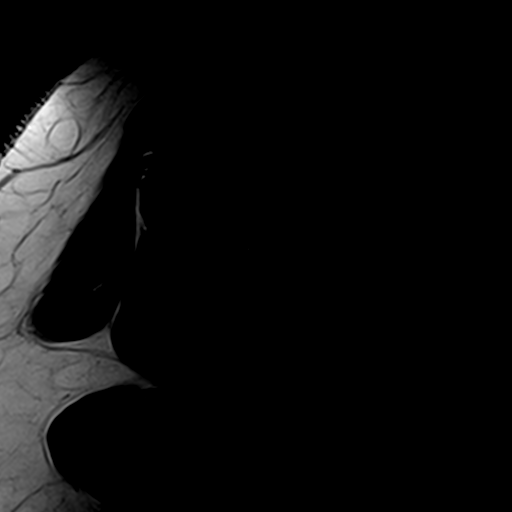
[im 9/24]
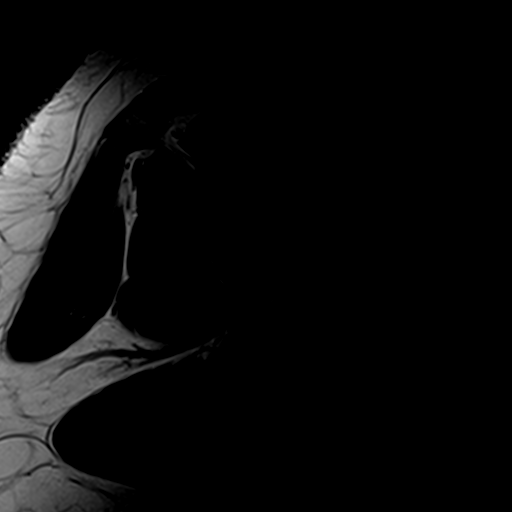
[im 12/24]
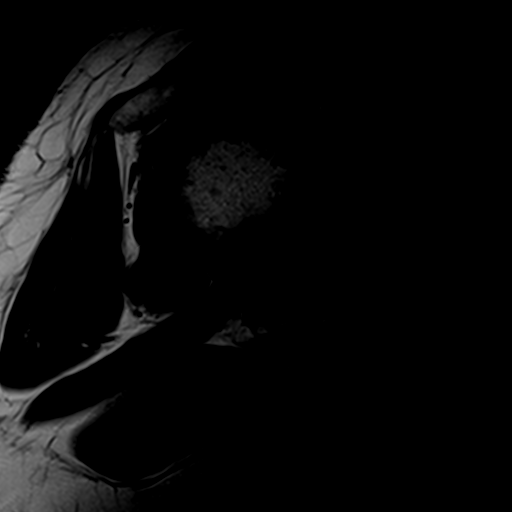
[im 15/24]
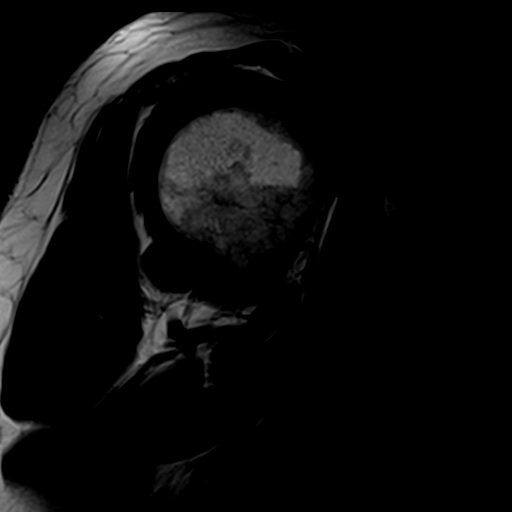
[im 18/24]
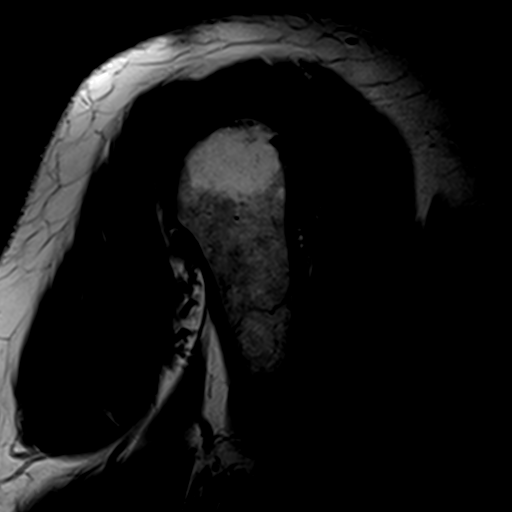
[im 21/24]
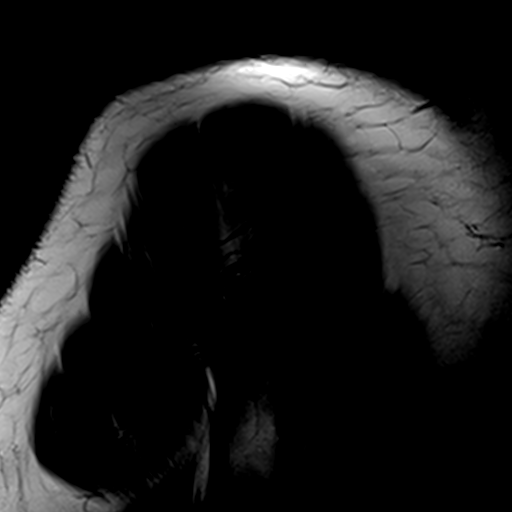
[im 24/24]
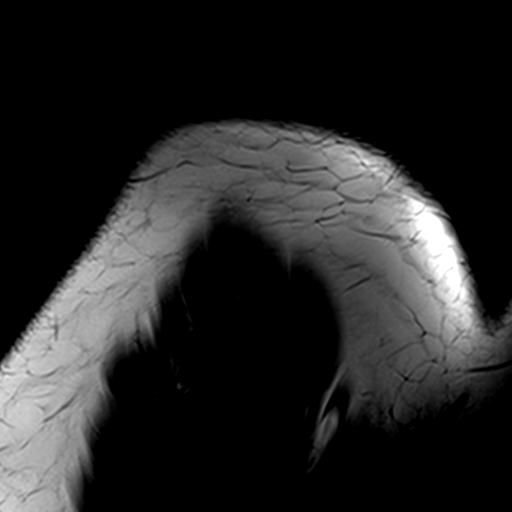

[19 of 40 positions shown; findings below may reference images not displayed]

FINDINGS: Rotator cuff: There is a deep focal partial-thickness articular
surface tear of the distal supraspinatus tendon best seen on images
9 and 10 of series 4. The tear extends 80% of the way through the
tendon with a few superficial fibers remaining intact. The remainder
of the rotator cuff is normal.

Muscles: No atrophy or abnormal signal of the muscles of the rotator
cuff.

Biceps long head:  Properly located and intact.

Acromioclavicular Joint: Minimal arthritic changes. Type 1 acromion.
No bursitis. Glenohumeral Joint: No joint effusion. No chondral
defect.

Labrum:  Intact.

Bones:  No marrow abnormality, fracture or dislocation.

Other: None
IMPRESSION: Focal small deep partial-thickness articular surface tear of the
distal supraspinatus tendon.

## 2020-08-23 ENCOUNTER — Other Ambulatory Visit: Payer: Self-pay | Admitting: Orthopaedic Surgery

## 2020-08-24 ENCOUNTER — Other Ambulatory Visit: Payer: Self-pay

## 2020-08-24 ENCOUNTER — Ambulatory Visit (INDEPENDENT_AMBULATORY_CARE_PROVIDER_SITE_OTHER): Payer: BLUE CROSS/BLUE SHIELD | Admitting: Orthopaedic Surgery

## 2020-08-24 ENCOUNTER — Encounter: Payer: Self-pay | Admitting: Orthopaedic Surgery

## 2020-08-24 VITALS — BP 183/111 | HR 108 | Ht 68.0 in | Wt 220.0 lb

## 2020-08-24 DIAGNOSIS — M25511 Pain in right shoulder: Secondary | ICD-10-CM

## 2020-08-24 DIAGNOSIS — Z9889 Other specified postprocedural states: Secondary | ICD-10-CM

## 2020-08-24 MED ORDER — HYDROCODONE-ACETAMINOPHEN 5-325 MG PO TABS: ORAL_TABLET | ORAL | 0 refills | Status: DC

## 2020-08-24 NOTE — Progress Notes (Signed)
Patient ZO:XWRUE Tina Pham, female DOB:Jan 30, 1976, 45 y.o. [id number]  Chief Complaint  Patient presents with  . Shoulder Pain    Right     HPI  Tina Pham is a 45 y.o. female who has continued pain of the right shoulder and is no better. She has limited motion.  I will get MRI as I am concerned about new rotator cuff tear.   Body mass index is 33.45 kg/m.  ROS  Review of Systems  Constitutional:       Patient has Diabetes Mellitus. Patient does not have hypertension. Patient does not have COPD or shortness of breath. Patient has BMI > 35. Patient has current smoking history.  HENT: Negative for congestion.   Respiratory: Negative for cough and shortness of breath.   Cardiovascular: Negative for chest pain.  Endocrine: Negative for cold intolerance.  Musculoskeletal: Positive for arthralgias, back pain and myalgias.  Allergic/Immunologic: Negative for environmental allergies.  Psychiatric/Behavioral: The patient is nervous/anxious.     All other systems reviewed and are negative.  The following is a summary of the past history medically, past history surgically, known current medicines, social history and family history.  This information is gathered electronically by the computer from prior information and documentation.  I review this each visit and have found including this information at this point in the chart is beneficial and informative.    Past Medical History:  Diagnosis Date  . Anxiety    panic attacks  . Arthritis   . Chronic back pain   . Chronic bronchitis (HCC)   . Depression   . Diabetes mellitus   . GERD (gastroesophageal reflux disease)    prn tums  . High cholesterol   . History of kidney stones    passed ?  Marland Kitchen Hypertension    no longer on BP medications  . Migraine   . Neuropathic pain of foot   . Plantar fasciitis   . Pneumonia 2011ish  . Sciatica   . Torn rotator cuff     Past Surgical History:  Procedure Laterality  Date  . ABDOMINAL AORTOGRAM W/LOWER EXTREMITY N/A 07/01/2020   Procedure: ABDOMINAL AORTOGRAM W/LOWER EXTREMITY;  Surgeon: Cephus Shelling, MD;  Location: [facility];  Service: Cardiovascular;  Laterality: N/A;  . AMPUTATION Left 07/22/2020   Procedure: LEFT GREAT TOE AMPUTATION;  Surgeon: Maeola Harman, MD;  Location: [facility] OR;  Service: Vascular;  Laterality: Left;  . BACK SURGERY     herniated disc; had disc removed and then fusion; total of 3 surgeries  . EYE SURGERY Bilateral    removal of cyst and straightening of muscles  . KNEE ARTHROSCOPY WITH MEDIAL MENISECTOMY Left 12/27/2016   Procedure: LEFT KNEE DIAGNOSTIC ARTHROSCOPY;  Surgeon: Vickki Hearing, MD;  Location: [facility];  Service: Orthopedics;  Laterality: Left;  . PERIPHERAL VASCULAR INTERVENTION Left 07/01/2020   Procedure: PERIPHERAL VASCULAR INTERVENTION;  Surgeon: Cephus Shelling, MD;  Location: [facility] INVASIVE CV LAB;  Service: Cardiovascular;  Laterality: Left;  common iliac  . SHOULDER OPEN ROTATOR CUFF REPAIR Right 09/19/2018   Procedure: ROTATOR CUFF REPAIR SHOULDER OPEN;  Surgeon: Vickki Hearing, MD;  Location: [facility];  Service: Orthopedics;  Laterality: Right;  . SHOULDER OPEN ROTATOR CUFF REPAIR Right 08/05/2019   Procedure: ROTATOR CUFF REPAIR SHOULDER OPEN;  Surgeon: Vickki Hearing, MD;  Location: [facility];  Service: Orthopedics;  Laterality: Right;  . TUBAL LIGATION      Family History  Problem Relation Age  of Onset  . Heart failure Mother   . COPD Mother   . Diabetes Mother   . Heart failure Father   . Diabetes Other     Social History Social History   Tobacco Use  . Smoking status: Current Every Day Smoker    Packs/day: 0.50    Years: 31.00    Pack years: 15.50    Types: Cigarettes  . Smokeless tobacco: Never Used  Vaping Use  . Vaping Use: Never used  Substance Use Topics  . Alcohol use: No  . Drug use: No    Allergies  Allergen Reactions  . Codeine Other (See  Comments)    Upset Stomach  . Metformin Other (See Comments)    Will not take per MD advisement   . Benadryl [Diphenhydramine Hcl] Palpitations  . Compazine Palpitations    Current Outpatient Medications  Medication Sig Dispense Refill  . aspirin EC 81 MG tablet Take 81 mg by mouth daily. Swallow whole.    Marland Kitchen atorvastatin (LIPITOR) 10 MG tablet Take 1 tablet (10 mg total) by mouth daily. 30 tablet 11  . clopidogrel (PLAVIX) 75 MG tablet Take 1 tablet (75 mg total) by mouth daily. 30 tablet 11  . gabapentin (NEURONTIN) 300 MG capsule TAKE 1 CAPSULE(300 MG) BY MOUTH THREE TIMES DAILY 90 capsule 5  . glipiZIDE (GLUCOTROL) 5 MG tablet Take 1 tablet (5 mg total) by mouth 2 (two) times daily. 60 tablet 11  . HYDROcodone-acetaminophen (NORCO/VICODIN) 5-325 MG tablet One tablet every six hours for pain.  Limit 7 days. 28 tablet 0  . ibuprofen (ADVIL) 200 MG tablet Take 800 mg by mouth every 8 (eight) hours as needed (for pain.).     No current facility-administered medications for this visit.     Physical Exam  Blood pressure (!) 183/111, pulse (!) 108, height 5\' 8"  (1.727 m), weight 220 lb (99.8 kg).  Constitutional: overall normal hygiene, normal nutrition, well developed, normal grooming, normal body habitus. Assistive device:none  Musculoskeletal: gait and station Limp none, muscle tone and strength are normal, no tremors or atrophy is present.  .  Neurological: coordination overall normal.  Deep tendon reflex/nerve stretch intact.  Sensation normal.  Cranial nerves II-XII intact.   Skin:   Normal overall no scars, lesions, ulcers or rashes. No psoriasis.  Psychiatric: Alert and oriented x 3.  Recent memory intact, remote memory unclear.  Normal mood and affect. Well groomed.  Good eye contact.  Cardiovascular: overall no swelling, no varicosities, no edema bilaterally, normal temperatures of the legs and arms, no clubbing, cyanosis and good capillary refill.  Lymphatic: palpation  is normal.  Right shoulder painful, decreased ROM, slight crepitus, NV intact.  All other systems reviewed and are negative   The patient has been educated about the nature of the problem(s) and counseled on treatment options.  The patient appeared to understand what I have discussed and is in agreement with it.  Encounter Diagnoses  Name Primary?  . Acute pain of right shoulder Yes  . S/P right rotator cuff repair     PLAN Call if any problems.  Precautions discussed.  Continue current medications.   Return to clinic 2 weeks   Get MRI of the right shoulder.  I have reviewed the Controlled Substance Reporting System web site prior to prescribing narcotic medicine for this patient.   Electronically Signed [place], MD 2/1/20229:08 AM

## 2020-08-30 ENCOUNTER — Telehealth: Payer: Self-pay | Admitting: Orthopaedic Surgery

## 2020-08-31 ENCOUNTER — Other Ambulatory Visit: Payer: Self-pay | Admitting: Orthopaedic Surgery

## 2020-08-31 MED ORDER — HYDROCODONE-ACETAMINOPHEN 5-325 MG PO TABS: ORAL_TABLET | ORAL | 0 refills | Status: DC

## 2020-09-06 ENCOUNTER — Other Ambulatory Visit: Payer: Self-pay | Admitting: Orthopaedic Surgery

## 2020-09-07 ENCOUNTER — Ambulatory Visit: Payer: BLUE CROSS/BLUE SHIELD | Admitting: Orthopaedic Surgery

## 2020-09-07 ENCOUNTER — Telehealth: Payer: Self-pay | Admitting: Orthopaedic Surgery

## 2020-09-07 ENCOUNTER — Other Ambulatory Visit: Payer: Self-pay

## 2020-09-07 ENCOUNTER — Ambulatory Visit (HOSPITAL_COMMUNITY)
Admission: RE | Admit: 2020-09-07 | Discharge: 2020-09-07 | Disposition: A | Discharge status: Home / Self Care | Payer: BLUE CROSS/BLUE SHIELD | Source: Ambulatory Visit | Attending: Orthopaedic Surgery | Admitting: Orthopaedic Surgery

## 2020-09-07 DIAGNOSIS — M25511 Pain in right shoulder: Secondary | ICD-10-CM | POA: Insufficient documentation

## 2020-09-07 DIAGNOSIS — Z9889 Other specified postprocedural states: Secondary | ICD-10-CM | POA: Insufficient documentation

## 2020-09-07 MED ORDER — HYDROCODONE-ACETAMINOPHEN 5-325 MG PO TABS: ORAL_TABLET | ORAL | 0 refills | Status: DC

## 2020-09-13 ENCOUNTER — Other Ambulatory Visit: Payer: Self-pay | Admitting: Orthopaedic Surgery

## 2020-09-13 MED ORDER — HYDROCODONE-ACETAMINOPHEN 5-325 MG PO TABS: ORAL_TABLET | ORAL | 0 refills | Status: DC

## 2020-09-14 ENCOUNTER — Encounter: Payer: Self-pay | Admitting: Orthopaedic Surgery

## 2020-09-14 ENCOUNTER — Other Ambulatory Visit: Payer: Self-pay

## 2020-09-14 ENCOUNTER — Ambulatory Visit (INDEPENDENT_AMBULATORY_CARE_PROVIDER_SITE_OTHER): Payer: BLUE CROSS/BLUE SHIELD | Admitting: Orthopaedic Surgery

## 2020-09-14 VITALS — BP 153/85 | HR 87 | Ht 68.0 in | Wt 227.4 lb

## 2020-09-14 DIAGNOSIS — M75121 Complete rotator cuff tear or rupture of right shoulder, not specified as traumatic: Secondary | ICD-10-CM

## 2020-09-14 NOTE — Patient Instructions (Signed)

## 2020-09-14 NOTE — Progress Notes (Signed)
Patient Tina Pham [name], female DOB:10/11/1975, 45 y.o. [id number]  No chief complaint on file.   HPI  LEEA Pham is a 45 y.o. female who has right shoulder pain.  She has prior rotator cuff repair on the right.  New MRI shows: IMPRESSION: 1. Prior rotator cuff repair. Moderate tendinosis of the supraspinatus tendon with a full-thickness tear of the anterior supraspinatus tendon measuring 15 mm in anterior-posterior dimension with a few intact posterior fibers. 2. Mild tendinosis of the infraspinatus tendon. 3. 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 4. Mild osteoarthritis of the glenohumeral joint.  I have explained the findings to her.  I will have Dr. Dallas Schimke of this office see her. She is agreeable.   There is no height or weight on file to calculate BMI.  ROS  Review of Systems  Constitutional:       Patient has Diabetes Mellitus. Patient does not have hypertension. Patient does not have COPD or shortness of breath. Patient has BMI > 35. Patient has current smoking history.  HENT: Negative for congestion.   Respiratory: Negative for cough and shortness of breath.   Cardiovascular: Negative for chest pain.  Endocrine: Negative for cold intolerance.  Musculoskeletal: Positive for arthralgias, back pain and myalgias.  Allergic/Immunologic: Negative for environmental allergies.  Psychiatric/Behavioral: The patient is nervous/anxious.     All other systems reviewed and are negative.  The following is a summary of the past history medically, past history surgically, known current medicines, social history and family history.  This information is gathered electronically by the computer from prior information and documentation.  I review this each visit and have found including this information at this point in the chart is beneficial and informative.    Past Medical History:  Diagnosis Date  .  Anxiety    panic attacks  . Arthritis   . Chronic back pain   . Chronic bronchitis (HCC)   . Depression   . Diabetes mellitus   . GERD (gastroesophageal reflux disease)    prn tums  . High cholesterol   . History of kidney stones    passed ?  Marland Kitchen Hypertension    no longer on BP medications  . Migraine   . Neuropathic pain of foot   . Plantar fasciitis   . Pneumonia 2011ish  . Sciatica   . Torn rotator cuff     Past Surgical History:  Procedure Laterality Date  . ABDOMINAL AORTOGRAM W/LOWER EXTREMITY N/A 07/01/2020   Procedure: ABDOMINAL AORTOGRAM W/LOWER EXTREMITY;  Surgeon: Cephus Shelling, MD;  Location: [facility];  Service: Cardiovascular;  Laterality: N/A;  . AMPUTATION Left 07/22/2020   Procedure: LEFT GREAT TOE AMPUTATION;  Surgeon: Maeola Harman, MD;  Location: [facility] OR;  Service: Vascular;  Laterality: Left;  . BACK SURGERY     herniated disc; had disc removed and then fusion; total of 3 surgeries  . EYE SURGERY Bilateral    removal of cyst and straightening of muscles  . KNEE ARTHROSCOPY WITH MEDIAL MENISECTOMY Left 12/27/2016   Procedure: LEFT KNEE DIAGNOSTIC ARTHROSCOPY;  Surgeon: Vickki Hearing, MD;  Location: [facility];  Service: Orthopedics;  Laterality: Left;  . PERIPHERAL VASCULAR INTERVENTION Left 07/01/2020   Procedure: PERIPHERAL VASCULAR INTERVENTION;  Surgeon: Cephus Shelling, MD;  Location: [facility] INVASIVE CV LAB;  Service: Cardiovascular;  Laterality: Left;  common iliac  . SHOULDER OPEN ROTATOR CUFF REPAIR Right 09/19/2018   Procedure: ROTATOR  CUFF REPAIR SHOULDER OPEN;  Surgeon: Vickki Hearing, MD;  Location: [facility];  Service: Orthopedics;  Laterality: Right;  . SHOULDER OPEN ROTATOR CUFF REPAIR Right 08/05/2019   Procedure: ROTATOR CUFF REPAIR SHOULDER OPEN;  Surgeon: Vickki Hearing, MD;  Location: [facility];  Service: Orthopedics;  Laterality: Right;  . TUBAL LIGATION      Family History  Problem Relation Age of Onset  .  Heart failure Mother   . COPD Mother   . Diabetes Mother   . Heart failure Father   . Diabetes Other     Social History Social History   Tobacco Use  . Smoking status: Current Every Day Smoker    Packs/day: 0.50    Years: 31.00    Pack years: 15.50    Types: Cigarettes  . Smokeless tobacco: Never Used  Vaping Use  . Vaping Use: Never used  Substance Use Topics  . Alcohol use: No  . Drug use: No    Allergies  Allergen Reactions  . Codeine Other (See Comments)    Upset Stomach  . Metformin Other (See Comments)    Will not take per MD advisement   . Benadryl [Diphenhydramine Hcl] Palpitations  . Compazine Palpitations    Current Outpatient Medications  Medication Sig Dispense Refill  . HYDROcodone-acetaminophen (NORCO/VICODIN) 5-325 MG tablet One tablet every six hours for pain.  Limit 7 days. 20 tablet 0  . aspirin EC 81 MG tablet Take 81 mg by mouth daily. Swallow whole.    Marland Kitchen atorvastatin (LIPITOR) 10 MG tablet Take 1 tablet (10 mg total) by mouth daily. 30 tablet 11  . clopidogrel (PLAVIX) 75 MG tablet Take 1 tablet (75 mg total) by mouth daily. 30 tablet 11  . gabapentin (NEURONTIN) 300 MG capsule TAKE 1 CAPSULE(300 MG) BY MOUTH THREE TIMES DAILY 90 capsule 5  . glipiZIDE (GLUCOTROL) 5 MG tablet Take 1 tablet (5 mg total) by mouth 2 (two) times daily. 60 tablet 11  . ibuprofen (ADVIL) 200 MG tablet Take 800 mg by mouth every 8 (eight) hours as needed (for pain.).     No current facility-administered medications for this visit.     Physical Exam  There were no vitals taken for this visit.  Constitutional: overall normal hygiene, normal nutrition, well developed, normal grooming, normal body habitus. Assistive device:none  Musculoskeletal: gait and station Limp none, muscle tone and strength are normal, no tremors or atrophy is present.  .  Neurological: coordination overall normal.  Deep tendon reflex/nerve stretch intact.  Sensation normal.  Cranial nerves  II-XII intact.   Skin:   Normal overall no scars, lesions, ulcers or rashes. No psoriasis.  Psychiatric: Alert and oriented x 3.  Recent memory intact, remote memory unclear.  Normal mood and affect. Well groomed.  Good eye contact.  Cardiovascular: overall no swelling, no varicosities, no edema bilaterally, normal temperatures of the legs and arms, no clubbing, cyanosis and good capillary refill.  Lymphatic: palpation is normal.  Right shoulder is tender to move and she prefers me not to move it today after she found out results of MRI.  She has a sling which she is not wearing now.  All other systems reviewed and are negative   The patient has been educated about the nature of the problem(s) and counseled on treatment options.  The patient appeared to understand what I have discussed and is in agreement with it.  Encounter Diagnosis  Name Primary?  Marland Kitchen Nontraumatic complete tear of right  rotator cuff Yes    PLAN Call if any problems.  Precautions discussed.  Continue current medications.   Return to clinic to see Dr. Dallas Schimke   Electronically Signed Darreld Mclean, MD 2/22/20222:22 PM

## 2020-09-15 ENCOUNTER — Ambulatory Visit: Payer: BLUE CROSS/BLUE SHIELD | Admitting: Orthopedic Surgery

## 2020-09-20 ENCOUNTER — Telehealth: Payer: Self-pay | Admitting: Orthopaedic Surgery

## 2020-09-20 MED ORDER — HYDROCODONE-ACETAMINOPHEN 5-325 MG PO TABS: ORAL_TABLET | ORAL | 0 refills | Status: DC

## 2020-09-22 ENCOUNTER — Ambulatory Visit: Payer: BLUE CROSS/BLUE SHIELD | Admitting: Orthopedic Surgery

## 2020-09-23 ENCOUNTER — Ambulatory Visit (INDEPENDENT_AMBULATORY_CARE_PROVIDER_SITE_OTHER): Payer: BLUE CROSS/BLUE SHIELD | Admitting: Orthopedic Surgery

## 2020-09-23 ENCOUNTER — Encounter: Payer: Self-pay | Admitting: Orthopedic Surgery

## 2020-09-23 ENCOUNTER — Other Ambulatory Visit: Payer: Self-pay

## 2020-09-23 ENCOUNTER — Telehealth: Payer: Self-pay | Admitting: Orthopedic Surgery

## 2020-09-23 VITALS — BP 185/110 | HR 102 | Ht 68.0 in | Wt 227.0 lb

## 2020-09-23 DIAGNOSIS — G8929 Other chronic pain: Secondary | ICD-10-CM

## 2020-09-23 DIAGNOSIS — S46011A Strain of muscle(s) and tendon(s) of the rotator cuff of right shoulder, initial encounter: Secondary | ICD-10-CM | POA: Diagnosis not present

## 2020-09-23 DIAGNOSIS — M19011 Primary osteoarthritis, right shoulder: Secondary | ICD-10-CM

## 2020-09-23 DIAGNOSIS — W1811XA Fall from or off toilet without subsequent striking against object, initial encounter: Secondary | ICD-10-CM

## 2020-09-23 MED ORDER — HYDROCODONE-ACETAMINOPHEN 7.5-325 MG PO TABS
1.0000 | ORAL_TABLET | Freq: Four times a day (QID) | ORAL | 0 refills | Status: DC | PRN
Start: 2020-09-23 — End: 2020-10-04

## 2020-09-23 MED ORDER — HYDROCODONE-ACETAMINOPHEN 7.5-325 MG PO TABS: 1.0000 | ORAL_TABLET | Freq: Four times a day (QID) | ORAL | 0 refills | Status: DC | PRN

## 2020-09-23 NOTE — Telephone Encounter (Signed)
When she saw Dr. Romeo Apple they changed her pain medicine and they are saying that since Dr. Hilda Lias prescribed, they will not do a refill until 7 days is up.  She is wondering if Dr. Romeo Apple can call the pharmacy and override this.    Please call her back at [phone]

## 2020-09-23 NOTE — Progress Notes (Signed)
Chief Complaint  Patient presents with  . Shoulder Pain    Right     45 year old female to surgeries on her right shoulder to repair her rotator cuff she was doing well until January 23 when she fell off of the commode landing on the right shoulder.  Complained of right shoulder pain eventually had x-rays which were relatively normal.  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  Maximum area of pain is over the [facility] joint  Her range of motion is so limited that we can even test the rotator cuff she does not have any external rotation deficit to suggest adhesive capsulitis  Assessment and plan  45 year old female status post 2 cuff repairs recurrent cuff tear possible acute AC joint injury although now at 6 weeks out she should have been getting better  However her range of motion deficit needs to be improved before surgery can be any entertained and then even if it can we will have to discuss with vein and vascular whether or not she could come off Plavix because she has a stent on the left side lower extremity requiring Plavix and aspirin therapy  She also cannot take anti-inflammatories with the Plavix and aspirin and hydrocodone is not controlling her pain  This is a scenario for major problems  This increases her risk for problems with pain management if surgery is needed and increases her risk of problems  dealing with opioid medications  Patient will have a consult with pain management as well  Encounter Diagnoses  Name Primary?  . Chronic right shoulder pain Yes  . Arthrosis of right acromioclavicular joint   . Traumatic tear of right rotator cuff, unspecified tear extent, initial encounter    OT, F/U 4 weeks . Meds ordered this encounter  Medications  . HYDROcodone-acetaminophen (NORCO) 7.5-325 MG tablet    Sig: Take 1 tablet by mouth every 6 (six) hours as needed for moderate pain.    Dispense:  28 tablet    Refill:  0

## 2020-09-23 NOTE — Telephone Encounter (Signed)
I called pharmacy to advise ok for today

## 2020-09-29 ENCOUNTER — Ambulatory Visit (HOSPITAL_COMMUNITY): Payer: BLUE CROSS/BLUE SHIELD | Attending: Orthopedic Surgery

## 2020-09-29 ENCOUNTER — Other Ambulatory Visit: Payer: Self-pay

## 2020-09-29 ENCOUNTER — Encounter (HOSPITAL_COMMUNITY): Payer: Self-pay

## 2020-09-29 DIAGNOSIS — M25611 Stiffness of right shoulder, not elsewhere classified: Secondary | ICD-10-CM | POA: Insufficient documentation

## 2020-09-29 DIAGNOSIS — R29898 Other symptoms and signs involving the musculoskeletal system: Secondary | ICD-10-CM | POA: Insufficient documentation

## 2020-09-29 DIAGNOSIS — M25511 Pain in right shoulder: Secondary | ICD-10-CM | POA: Insufficient documentation

## 2020-09-29 NOTE — Patient Instructions (Signed)
Complete 2-3 times a day. Complete 10-15 repetitions. 1 set each.   1) SHOULDER: Flexion On Table   Place hands on towel placed on table, elbows straight. Lean forward with you upper body, pushing towel away from body.    2) Abduction (Passive)   With arm out to side, resting on towel placed on table with palm DOWN, keeping trunk away from table, lean to the side while pushing towel away from body.    Copyright  VHI. All rights reserved.     3) Internal Rotation (Assistive)   Seated with elbow bent at right angle and held against side, slide arm on table surface in an inward arc keeping elbow anchored in place.  Activity: Use this motion to brush crumbs off the table.  Copyright  VHI. All rights reserved.

## 2020-09-29 NOTE — Therapy (Signed)
[facility] [address] [place], [place], [address] Phone: [phone]   Fax:  [phone]  Occupational Therapy Evaluation  Patient Details  Name: Tina Pham MRN: [id number] Date of Birth: May 06, 1976 Referring Provider (OT): Fuller Canada, MD   Encounter Date: 09/29/2020   OT End of Session - 09/29/20 1717    Visit Number 1    Number of Visits 8    Date for OT Re-Evaluation 10/27/20    Authorization Type [employer] $50 copay 30 visits limit OT/PT/SLP    Authorization - Visit Number 1    Authorization - Number of Visits 30    OT Start Time 1600    OT Stop Time 1647    OT Time Calculation (min) 47 min    Activity Tolerance Patient tolerated treatment well    Behavior During Therapy [facility] for tasks assessed/performed           Past Medical History:  Diagnosis Date  . Anxiety    panic attacks  . Arthritis   . Chronic back pain   . Chronic bronchitis (HCC)   . Depression   . Diabetes mellitus   . GERD (gastroesophageal reflux disease)    prn tums  . High cholesterol   . History of kidney stones    passed ?  Marland Kitchen Hypertension    no longer on BP medications  . Migraine   . Neuropathic pain of foot   . Plantar fasciitis   . Pneumonia 2011ish  . Sciatica   . Torn rotator cuff     Past Surgical History:  Procedure Laterality Date  . ABDOMINAL AORTOGRAM W/LOWER EXTREMITY N/A 07/01/2020   Procedure: ABDOMINAL AORTOGRAM W/LOWER EXTREMITY;  Surgeon: Cephus Shelling, MD;  [facility]: [facility] INVASIVE CV LAB;  Service: Cardiovascular;  Laterality: N/A;  . AMPUTATION Left 07/22/2020   Procedure: LEFT GREAT TOE AMPUTATION;  Surgeon: Maeola Harman, MD;  Location: [facility] OR;  Service: Vascular;  Laterality: Left;  . BACK SURGERY     herniated disc; had disc removed and then fusion; total of 3 surgeries  . EYE SURGERY Bilateral    removal of cyst and straightening of muscles  . KNEE ARTHROSCOPY WITH MEDIAL MENISECTOMY Left  12/27/2016   Procedure: LEFT KNEE DIAGNOSTIC ARTHROSCOPY;  Surgeon: Vickki Hearing, MD;  Location: [facility] ORS;  Service: Orthopedics;  Laterality: Left;  . PERIPHERAL VASCULAR INTERVENTION Left 07/01/2020   Procedure: PERIPHERAL VASCULAR INTERVENTION;  Surgeon: Cephus Shelling, MD;  Location: [facility] INVASIVE CV LAB;  Service: Cardiovascular;  Laterality: Left;  common iliac  . SHOULDER OPEN ROTATOR CUFF REPAIR Right 09/19/2018   Procedure: ROTATOR CUFF REPAIR SHOULDER OPEN;  Surgeon: Vickki Hearing, MD;  Location: [facility] ORS;  Service: Orthopedics;  Laterality: Right;  . SHOULDER OPEN ROTATOR CUFF REPAIR Right 08/05/2019   Procedure: ROTATOR CUFF REPAIR SHOULDER OPEN;  Surgeon: Vickki Hearing, MD;  Location: [facility] ORS;  Service: Orthopedics;  Laterality: Right;  . TUBAL LIGATION      There were no vitals filed for this visit.   Subjective Assessment - 09/29/20 1610    Subjective  S: I fell onto this right shoulder and now there is a small tear.    Pertinent History Patient is a 45 y/o female S/P right RTC cuff tear which occured after sustaining a fall on 08/15/20. Patient previously underwent a Right shoulder RTC tear 09/19/18 and a repeat repair to the right shoulder on 08/05/19. She completed her therapy at this clinic  for both procedures. Dr. Romeo AppleHarrison has referred patient to occupational therapy for evaluation and treatment.    Patient Stated Goals To decrease pain, increase use of right UE, prevent surgery if possible    Currently in Pain? Yes   Without pain medication patient reports pain is 9/10   Pain Score 6     Pain Location Shoulder    Pain Orientation Right    Pain Descriptors / Indicators Constant;Throbbing;Aching;Shooting;Sharp    Pain Type Acute pain    Pain Onset More than a month ago    Pain Frequency Constant    Aggravating Factors  movement and use    Pain Relieving Factors pain medication, cold helps the most, heat    Effect of Pain on Daily Activities max effect     Multiple Pain Sites No             [facility] OT Assessment - 09/29/20 1606      Assessment   Medical Diagnosis Right RTC tear    Referring Provider (OT) Fuller CanadaStanley Harrison, MD    Onset Date/Surgical Date 08/15/20    Hand Dominance Right    Next MD Visit 10/21/20    Prior Therapy Pt received OT services in this OP clinic for two right RTC repair      Precautions   Precautions None      Restrictions   Weight Bearing Restrictions No      Balance Screen   Has the patient fallen in the past 6 months Yes    How many times? 1    Has the patient had a decrease in activity level because of a fear of falling?  No    Is the patient reluctant to leave their home because of a fear of falling?  No      Home  Environment   Family/patient expects to be discharged to: Private residence      Prior Function   Level of Independence Independent    Vocation Full time employment    Vocation Requirements [employer]      ADL   ADL comments Difficulty utilizing her RUE for all daily tasks such as reaching overhead and out to the side, decreased strength and ROM.      Mobility   Mobility Status Independent      Written Expression   Dominant Hand Right      Vision - History   Baseline Vision Wears glasses all the time      Cognition   Overall Cognitive Status Within Functional Limits for tasks assessed      Observation/Other Assessments   Focus on Therapeutic Outcomes (FOTO)  Complete at next session      Posture/Postural Control   Posture/Postural Control Postural limitations    Postural Limitations Rounded Shoulders;Forward head      ROM / Strength   AROM / PROM / Strength AROM;PROM;Strength      Palpation   Palpation comment moderate fascial restrictions in the left upper arm, upper trapezius, supraspinatus region.      AROM   Overall AROM Comments Assessed seated. IR/er adducted    AROM Assessment Site Shoulder    Right/Left Shoulder Right    Right Shoulder Flexion 106  Degrees    Right Shoulder ABduction 88 Degrees    Right Shoulder Internal Rotation 90 Degrees    Right Shoulder External Rotation 53 Degrees      PROM   Overall PROM Comments Assessed supine. IR/er adducted    PROM Assessment  Site Shoulder    Right/Left Shoulder Right    Right Shoulder Flexion 120 Degrees    Right Shoulder ABduction 125 Degrees    Right Shoulder Internal Rotation 90 Degrees    Right Shoulder External Rotation 45 Degrees      Strength   Overall Strength Comments Assessed IR/er adducted    Strength Assessment Site Shoulder    Right/Left Shoulder Right    Right Shoulder Flexion 3-/5    Right Shoulder ABduction 3-/5    Right Shoulder Internal Rotation 3/5    Right Shoulder External Rotation 3-/5                           OT Education - 09/29/20 1716    Education Details table slides for HEP. Recommended not lifting heavy. Do not complete any activity with the RUE that causes increased pain.    Person(s) Educated Patient    Methods Explanation;Demonstration;Handout;Verbal cues    Comprehension Verbalized understanding            OT Short Term Goals - 09/29/20 1721      OT SHORT TERM GOAL #1   Title Patient will be educated and independent with HEP to faciliate progress in therapy and allow her to return to using her RUE as her dominant extremity for all daily and work activities.     Time 4    Period Weeks    Status New    Target Date 10/27/20      OT SHORT TERM GOAL #2   Title Patient will increase RUE P/ROM to WNL in order to complete upper body dressing tasks with less difficulty.    Time 4    Period Weeks    Status New      OT SHORT TERM GOAL #3   Title Pt will increase RUE strength to 3/5 in order to complete waist level tasks with less difficulty.    Time 4    Period Weeks    Status New      OT SHORT TERM GOAL #4   Title Patient will report a decrease in pain level of 3/10 when completing daily tasks with her RUE.    Time 4     Period Weeks    Status New      OT SHORT TERM GOAL #5   Title Patient will demonstrate fascial restrictions of min amount or less in her RUE in order to increase functional mobility needed to complete reaching tasks.    Time 4    Period Weeks    Status New      Additional Short Term Goals   Additional Short Term Goals Yes      OT SHORT TERM GOAL #6   Title Patient will increase RUE A/ROM to [facility] in order to be able to reach to shoulder level or above when completing work and daily tasks.    Time 4    Period Weeks    Status New                    Plan - 09/29/20 1718    Clinical Impression Statement A: Patient is a 45 y/o female S/P Right RTC tear causing increased pain, fascial restrictions, and decreased ROM and strength resulting in difficulty completing daily and work related tasks using her RUE as her dominant extremity.    OT Occupational Profile and History Problem Focused Assessment - Including review of records relating to  presenting problem    Occupational performance deficits (Please refer to evaluation for details): ADL's;Work;IADL's;Rest and Sleep    Body Structure / Function / Physical Skills ADL;UE functional use;Fascial restriction;Pain;ROM;Strength    Rehab Potential Good    Clinical Decision Making Limited treatment options, no task modification necessary    Comorbidities Affecting Occupational Performance: May have comorbidities impacting occupational performance    Modification or Assistance to Complete Evaluation  No modification of tasks or assist necessary to complete eval    OT Frequency 2x / week    OT Duration 4 weeks    OT Treatment/Interventions Self-care/ADL training;Ultrasound;Patient/family education;DME and/or AE instruction;Passive range of motion;Cryotherapy;Electrical Stimulation;Moist Heat;Neuromuscular education;Therapeutic activities;Manual Therapy;Therapeutic exercise    Plan P: Patient will benefit from skilled OT services to increase  functional performance and use of her RUE as her dominant extremity during daily and work related tasks. Treatment plan: myofascial release, manual stretching, P/ROM, AA/ROM, A/ROM, general shoulder and scapular strengthening. Modalities PRN. Next session: FOTO    OT Home Exercise Plan eval: table slides    Consulted and Agree with Plan of Care Patient           Patient will benefit from skilled therapeutic intervention in order to improve the following deficits and impairments:   Body Structure / Function / Physical Skills: ADL,UE functional use,Fascial restriction,Pain,ROM,Strength       Visit Diagnosis: Stiffness of right shoulder, not elsewhere classified - Plan: Ot plan of care cert/re-cert  Other symptoms and signs involving the musculoskeletal system - Plan: Ot plan of care cert/re-cert  Acute pain of right shoulder - Plan: Ot plan of care cert/re-cert    Problem List Patient Active Problem List   Diagnosis Date Noted  . PAD (peripheral artery disease) (HCC) 07/13/2020  . S/P right rotator cuff repair open 09/19/18 repaired again 08/05/19 10/04/2018  . Complete tear of right rotator cuff   . Chronic pain of left knee   . Left shoulder pain 09/21/2015  . Disorders of bursae and tendons in shoulder region, unspecified 09/25/2013    Tina Pham, OTR/L,CBIS  [phone]  09/29/2020, 5:25 PM  [facility] [address] [place], [place], [address] Phone: [phone]   Fax:  [phone]  Name: Tina Pham MRN: [id number] Date of Birth: Jul 01, 1976

## 2020-09-30 ENCOUNTER — Other Ambulatory Visit: Payer: Self-pay

## 2020-09-30 DIAGNOSIS — G8929 Other chronic pain: Secondary | ICD-10-CM

## 2020-09-30 DIAGNOSIS — M25511 Pain in right shoulder: Secondary | ICD-10-CM

## 2020-09-30 DIAGNOSIS — S46011A Strain of muscle(s) and tendon(s) of the rotator cuff of right shoulder, initial encounter: Secondary | ICD-10-CM

## 2020-09-30 DIAGNOSIS — M19011 Primary osteoarthritis, right shoulder: Secondary | ICD-10-CM

## 2020-10-04 ENCOUNTER — Other Ambulatory Visit: Payer: Self-pay

## 2020-10-04 DIAGNOSIS — M19011 Primary osteoarthritis, right shoulder: Secondary | ICD-10-CM

## 2020-10-04 DIAGNOSIS — M25511 Pain in right shoulder: Secondary | ICD-10-CM

## 2020-10-04 DIAGNOSIS — G8929 Other chronic pain: Secondary | ICD-10-CM

## 2020-10-04 DIAGNOSIS — S46011A Strain of muscle(s) and tendon(s) of the rotator cuff of right shoulder, initial encounter: Secondary | ICD-10-CM

## 2020-10-04 MED ORDER — HYDROCODONE-ACETAMINOPHEN 7.5-325 MG PO TABS: 1.0000 | ORAL_TABLET | Freq: Four times a day (QID) | ORAL | 0 refills | Status: DC | PRN

## 2020-10-05 ENCOUNTER — Ambulatory Visit (HOSPITAL_COMMUNITY): Payer: BLUE CROSS/BLUE SHIELD

## 2020-10-07 ENCOUNTER — Other Ambulatory Visit: Payer: Self-pay

## 2020-10-07 ENCOUNTER — Ambulatory Visit (HOSPITAL_COMMUNITY): Payer: BLUE CROSS/BLUE SHIELD

## 2020-10-07 ENCOUNTER — Encounter (HOSPITAL_COMMUNITY): Payer: Self-pay

## 2020-10-07 DIAGNOSIS — M25611 Stiffness of right shoulder, not elsewhere classified: Secondary | ICD-10-CM | POA: Diagnosis not present

## 2020-10-07 DIAGNOSIS — R29898 Other symptoms and signs involving the musculoskeletal system: Secondary | ICD-10-CM

## 2020-10-07 DIAGNOSIS — M25511 Pain in right shoulder: Secondary | ICD-10-CM

## 2020-10-07 NOTE — Therapy (Signed)
[facility] [address] [place], [place], [address] Phone: [phone]   Fax:  [phone]  Occupational Therapy Treatment  Patient Details  Name: Tina Pham MRN: [id number] Date of Birth: Oct 07, 1975 Referring Provider (OT): Arther Abbott, MD   Encounter Date: 10/07/2020   OT End of Session - 10/07/20 1538    Visit Number 2    Number of Visits 8    Date for OT Re-Evaluation 10/27/20    Authorization Type [employer] $50 copay 30 visits limit OT/PT/SLP    Authorization - Visit Number 2    Authorization - Number of Visits 30    OT Start Time 9150   Pt arrived late   OT Stop Time 1515    OT Time Calculation (min) 26 min    Activity Tolerance Patient tolerated treatment well    Behavior During Therapy [facility] for tasks assessed/performed           Past Medical History:  Diagnosis Date  . Anxiety    panic attacks  . Arthritis   . Chronic back pain   . Chronic bronchitis ([place])   . Depression   . Diabetes mellitus   . GERD (gastroesophageal reflux disease)    prn tums  . High cholesterol   . History of kidney stones    passed ?  Marland Kitchen Hypertension    no longer on BP medications  . Migraine   . Neuropathic pain of foot   . Plantar fasciitis   . Pneumonia 2011ish  . Sciatica   . Torn rotator cuff     Past Surgical History:  Procedure Laterality Date  . ABDOMINAL AORTOGRAM W/LOWER EXTREMITY N/A 07/01/2020   Procedure: ABDOMINAL AORTOGRAM W/LOWER EXTREMITY;  Surgeon: Marty Heck, MD;  Location: [place] CV LAB;  Service: Cardiovascular;  Laterality: N/A;  . AMPUTATION Left 07/22/2020   Procedure: LEFT GREAT TOE AMPUTATION;  Surgeon: Waynetta Sandy, MD;  Location: [facility];  Service: Vascular;  Laterality: Left;  . BACK SURGERY     herniated disc; had disc removed and then fusion; total of 3 surgeries  . EYE SURGERY Bilateral    removal of cyst and straightening of muscles  . KNEE ARTHROSCOPY WITH MEDIAL  MENISECTOMY Left 12/27/2016   Procedure: LEFT KNEE DIAGNOSTIC ARTHROSCOPY;  Surgeon: Carole Civil, MD;  Location: [facility];  Service: Orthopedics;  Laterality: Left;  . PERIPHERAL VASCULAR INTERVENTION Left 07/01/2020   Procedure: PERIPHERAL VASCULAR INTERVENTION;  Surgeon: Marty Heck, MD;  Location: [facility] CV LAB;  Service: Cardiovascular;  Laterality: Left;  common iliac  . SHOULDER OPEN ROTATOR CUFF REPAIR Right 09/19/2018   Procedure: ROTATOR CUFF REPAIR SHOULDER OPEN;  Surgeon: Carole Civil, MD;  Location: [facility];  Service: Orthopedics;  Laterality: Right;  . SHOULDER OPEN ROTATOR CUFF REPAIR Right 08/05/2019   Procedure: ROTATOR CUFF REPAIR SHOULDER OPEN;  Surgeon: Carole Civil, MD;  Location: [facility];  Service: Orthopedics;  Laterality: Right;  . TUBAL LIGATION      There were no vitals filed for this visit.   Subjective Assessment - 10/07/20 1509    Subjective  S: It's sore.    Currently in Pain? Yes    Pain Score 7     Pain Location Shoulder    Pain Orientation Right    Pain Descriptors / Indicators Aching;Constant;Throbbing;Shooting;Sharp    Pain Type Acute pain    Pain Onset More than a month ago    Pain Frequency Constant  Aggravating Factors  movement and use    Pain Relieving Factors pain medication, cold helps the most, heat    Effect of Pain on Daily Activities max effect    Multiple Pain Sites No              [facility] OT Assessment - 10/07/20 1514      Assessment   Medical Diagnosis Right RTC tear      Precautions   Precautions None                    OT Treatments/Exercises (OP) - 10/07/20 1516      Exercises   Exercises Shoulder      Shoulder Exercises: Supine   Protraction PROM;5 reps    Horizontal ABduction PROM;5 reps    External Rotation PROM;5 reps    Internal Rotation PROM;5 reps    Flexion PROM;5 reps    ABduction PROM;5 reps      Shoulder Exercises: Seated   Extension AROM;10 reps    Row AROM;10  reps      Shoulder Exercises: Therapy Ball   Flexion 10 reps   2" hold   ABduction 10 reps   2" hold     Shoulder Exercises: ROM/Strengthening   Prot/Ret//Elev/Dep 1'      Manual Therapy   Manual Therapy Myofascial release    Manual therapy comments completed separately from therapeutic exercises    Myofascial Release Myofasical release and manual stretching completed to right upper arm, trapezius, and scapularis region to decrease fascial restrictions and increase joint mobility in a pain free zone.                    OT Short Term Goals - 10/07/20 1512      OT SHORT TERM GOAL #1   Title Patient will be educated and independent with HEP to faciliate progress in therapy and allow her to return to using her RUE as her dominant extremity for all daily and work activities.     Time 4    Period Weeks    Status On-going    Target Date 10/27/20      OT SHORT TERM GOAL #2   Title Patient will increase RUE P/ROM to WNL in order to complete upper body dressing tasks with less difficulty.    Time 4    Period Weeks    Status On-going      OT SHORT TERM GOAL #3   Title Pt will increase RUE strength to 3/5 in order to complete waist level tasks with less difficulty.    Time 4    Period Weeks    Status On-going      OT SHORT TERM GOAL #4   Title Patient will report a decrease in pain level of 3/10 when completing daily tasks with her RUE.    Time 4    Period Weeks    Status On-going      OT SHORT TERM GOAL #5   Title Patient will demonstrate fascial restrictions of min amount or less in her RUE in order to increase functional mobility needed to complete reaching tasks.    Time 4    Period Weeks    Status On-going      OT SHORT TERM GOAL #6   Title Patient will increase RUE A/ROM to [facility] in order to be able to reach to shoulder level or above when completing work and daily tasks.    Time 4  Period Weeks    Status On-going                    Plan -  10/07/20 1538    Clinical Impression Statement A: Initiated myofascial release to address large trigger points in right upper trapezius region. Moderate fascial restrictions noted in the right upper arm region. Pain felt during passive stretching in all shoulder ranges. Completed therapy ball stretches to further increase ROM. VC for form and technique were provided.    Body Structure / Function / Physical Skills ADL;UE functional use;Fascial restriction;Pain;ROM;Strength    Plan P: Continue with manual therapy to address fascial restrictions. PVC pipe if able to tolerate. isometric exercises    Consulted and Agree with Plan of Care Patient           Patient will benefit from skilled therapeutic intervention in order to improve the following deficits and impairments:   Body Structure / Function / Physical Skills: ADL,UE functional use,Fascial restriction,Pain,ROM,Strength       Visit Diagnosis: Stiffness of right shoulder, not elsewhere classified  Other symptoms and signs involving the musculoskeletal system  Acute pain of right shoulder    Problem List Patient Active Problem List   Diagnosis Date Noted  . PAD (peripheral artery disease) (HCC) 07/13/2020  . S/P right rotator cuff repair open 09/19/18 repaired again 08/05/19 10/04/2018  . Complete tear of right rotator cuff   . Chronic pain of left knee   . Left shoulder pain 09/21/2015  . Disorders of bursae and tendons in shoulder region, unspecified 09/25/2013    Alie Essenmacher, OTR/L,CBIS  [phone]  10/07/2020, 3:41 PM  [facility] [address] [place], [place], [address] Phone: [phone]   Fax:  [phone]  Name: Nafisa W Skoczylas MRN: [id number] Date of Birth: 03/03/1976 

## 2020-10-08 ENCOUNTER — Other Ambulatory Visit: Payer: Self-pay

## 2020-10-08 DIAGNOSIS — G8929 Other chronic pain: Secondary | ICD-10-CM

## 2020-10-08 DIAGNOSIS — M19011 Primary osteoarthritis, right shoulder: Secondary | ICD-10-CM

## 2020-10-08 DIAGNOSIS — S46011A Strain of muscle(s) and tendon(s) of the rotator cuff of right shoulder, initial encounter: Secondary | ICD-10-CM

## 2020-10-11 ENCOUNTER — Other Ambulatory Visit: Payer: Self-pay | Admitting: Orthopedic Surgery

## 2020-10-11 ENCOUNTER — Encounter: Payer: Self-pay | Admitting: Vascular Surgery

## 2020-10-11 ENCOUNTER — Ambulatory Visit (INDEPENDENT_AMBULATORY_CARE_PROVIDER_SITE_OTHER): Payer: BLUE CROSS/BLUE SHIELD

## 2020-10-11 ENCOUNTER — Other Ambulatory Visit: Payer: Self-pay

## 2020-10-11 ENCOUNTER — Ambulatory Visit (INDEPENDENT_AMBULATORY_CARE_PROVIDER_SITE_OTHER): Payer: BLUE CROSS/BLUE SHIELD | Admitting: Vascular Surgery

## 2020-10-11 VITALS — BP 132/84 | HR 87 | Temp 97.7°F | Resp 16 | Ht 68.0 in | Wt 231.0 lb

## 2020-10-11 DIAGNOSIS — I739 Peripheral vascular disease, unspecified: Secondary | ICD-10-CM

## 2020-10-11 DIAGNOSIS — S46011A Strain of muscle(s) and tendon(s) of the rotator cuff of right shoulder, initial encounter: Secondary | ICD-10-CM

## 2020-10-11 MED ORDER — HYDROCODONE-ACETAMINOPHEN 5-325 MG PO TABS: 1.0000 | ORAL_TABLET | Freq: Three times a day (TID) | ORAL | 0 refills | Status: DC | PRN

## 2020-10-11 NOTE — Progress Notes (Signed)
   [facility] [place]  Patient name: LYNELL GREENHOUSE MRN: [id number] DOB: Mar 24, 1976 Sex: female  REASON FOR VISIT: Follow-up left iliac angioplasty and stenting and left great toe amputation  HPI: COURTNEI RUDDELL is a 45 y.o. female here today for follow-up.  Had presented with embolic phenomenon to her left great toe.  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  She underwent stenting of this and subsequent left great toe amputation on 07/22/2020.  She has had complete healing of her toe.  She reports occasional shooting pain at the level of her toe and medial foot but this is becoming less so.  She is walking without difficulty.  Current Outpatient Medications  Medication Sig Dispense Refill  . aspirin EC 81 MG tablet Take 81 mg by mouth daily. Swallow whole.    Marland Kitchen atorvastatin (LIPITOR) 10 MG tablet Take 1 tablet (10 mg total) by mouth daily. 30 tablet 11  . clopidogrel (PLAVIX) 75 MG tablet Take 1 tablet (75 mg total) by mouth daily. 30 tablet 11  . gabapentin (NEURONTIN) 300 MG capsule TAKE 1 CAPSULE(300 MG) BY MOUTH THREE TIMES DAILY 90 capsule 5  . glipiZIDE (GLUCOTROL) 5 MG tablet Take 1 tablet (5 mg total) by mouth 2 (two) times daily. 60 tablet 11  . HYDROcodone-acetaminophen (NORCO) 7.5-325 MG tablet Take 1 tablet by mouth every 6 (six) hours as needed for moderate pain. 28 tablet 0  . ibuprofen (ADVIL) 200 MG tablet Take 800 mg by mouth every 8 (eight) hours as needed (for pain.).     No current facility-administered medications for this visit.     PHYSICAL EXAM: Vitals:   10/11/20 0900  BP: 132/84  Pulse: 87  Resp: 16  Temp: 97.7 F (36.5 C)  TempSrc: Other (Comment)  SpO2: 97%  Weight: 231 lb (104.8 kg)  Height: 5\' 8"  (1.727 m)    GENERAL: The patient is a well-nourished female, in no acute distress. The vital signs are documented above. 2-3+ dorsalis pedis pulses  bilaterally  Duplex today of her iliac artery on the left reveals wide patency of her stent with no evidence of restenosis  Ankle arm index normal bilaterally  MEDICAL ISSUES: Stable overall.  Will continue her usual walking program.  We will see her again in 1 year with repeat iliac duplex and ankle arm indices.  She will continue her aspirin and Plavix until that time   , MD FACS [facility] [place] Office Tel [phone]  Note: Portions of this report may have been transcribed using voice recognition software.  Every effort has been made to ensure accuracy; however, inadvertent computerized transcription errors may still be present.

## 2020-10-11 NOTE — Progress Notes (Signed)
Meds ordered this encounter  Medications  . HYDROcodone-acetaminophen (NORCO/VICODIN) 5-325 MG tablet    Sig: Take 1 tablet by mouth every 8 (eight) hours as needed for up to 7 days for moderate pain.    Dispense:  21 tablet    Refill:  0    

## 2020-10-12 ENCOUNTER — Encounter (HOSPITAL_COMMUNITY): Payer: Self-pay

## 2020-10-12 ENCOUNTER — Ambulatory Visit (HOSPITAL_COMMUNITY): Payer: BLUE CROSS/BLUE SHIELD

## 2020-10-12 DIAGNOSIS — M25611 Stiffness of right shoulder, not elsewhere classified: Secondary | ICD-10-CM

## 2020-10-12 DIAGNOSIS — R29898 Other symptoms and signs involving the musculoskeletal system: Secondary | ICD-10-CM

## 2020-10-12 DIAGNOSIS — M25511 Pain in right shoulder: Secondary | ICD-10-CM

## 2020-10-12 NOTE — Therapy (Signed)
[facility] [address] [place], [place], [address] Phone: [phone]   Fax:  [phone]  Occupational Therapy Treatment  Patient Details  Name: Tina Pham MRN: [id number] Date of Birth: 19-Mar-1976 Referring Provider (OT): Fuller Canada, MD   Encounter Date: 10/12/2020   OT End of Session - 10/12/20 1544    Visit Number 3    Number of Visits 8    Date for OT Re-Evaluation 10/27/20    Authorization Type [employer] $50 copay 30 visits limit OT/PT/SLP    Authorization - Visit Number 3    Authorization - Number of Visits 30    OT Start Time 1402   Pt arrived late   OT Stop Time 1427    OT Time Calculation (min) 25 min    Activity Tolerance Patient tolerated treatment well    Behavior During Therapy [employer] for tasks assessed/performed           Past Medical History:  Diagnosis Date  . Anxiety    panic attacks  . Arthritis   . Chronic back pain   . Chronic bronchitis (HCC)   . Depression   . Diabetes mellitus   . GERD (gastroesophageal reflux disease)    prn tums  . High cholesterol   . History of kidney stones    passed ?  Marland Kitchen Hypertension    no longer on BP medications  . Migraine   . Neuropathic pain of foot   . Plantar fasciitis   . Pneumonia 2011ish  . Sciatica   . Torn rotator cuff     Past Surgical History:  Procedure Laterality Date  . ABDOMINAL AORTOGRAM W/LOWER EXTREMITY N/A 07/01/2020   Procedure: ABDOMINAL AORTOGRAM W/LOWER EXTREMITY;  Surgeon: Cephus Shelling, MD;  [facility]: [facility] INVASIVE CV LAB;  Service: Cardiovascular;  Laterality: N/A;  . AMPUTATION Left 07/22/2020   Procedure: LEFT GREAT TOE AMPUTATION;  Surgeon: Maeola Harman, MD;  Location: [facility] OR;  Service: Vascular;  Laterality: Left;  . BACK SURGERY     herniated disc; had disc removed and then fusion; total of 3 surgeries  . EYE SURGERY Bilateral    removal of cyst and straightening of muscles  . KNEE ARTHROSCOPY WITH MEDIAL  MENISECTOMY Left 12/27/2016   Procedure: LEFT KNEE DIAGNOSTIC ARTHROSCOPY;  Surgeon: Vickki Hearing, MD;  Location: [facility] ORS;  Service: Orthopedics;  Laterality: Left;  . PERIPHERAL VASCULAR INTERVENTION Left 07/01/2020   Procedure: PERIPHERAL VASCULAR INTERVENTION;  Surgeon: Cephus Shelling, MD;  Location: [facility] INVASIVE CV LAB;  Service: Cardiovascular;  Laterality: Left;  common iliac  . SHOULDER OPEN ROTATOR CUFF REPAIR Right 09/19/2018   Procedure: ROTATOR CUFF REPAIR SHOULDER OPEN;  Surgeon: Vickki Hearing, MD;  Location: [facility] ORS;  Service: Orthopedics;  Laterality: Right;  . SHOULDER OPEN ROTATOR CUFF REPAIR Right 08/05/2019   Procedure: ROTATOR CUFF REPAIR SHOULDER OPEN;  Surgeon: Vickki Hearing, MD;  Location: [facility] ORS;  Service: Orthopedics;  Laterality: Right;  . TUBAL LIGATION      There were no vitals filed for this visit.   Subjective Assessment - 10/12/20 1415    Subjective  S: I tried to hang some photos back on the wall at work and that didn't work so well.    Currently in Pain? Yes    Pain Score 7     Pain Location Shoulder    Pain Orientation Right    Pain Descriptors / Indicators Aching;Constant;Throbbing;Shooting;Sharp    Pain Type Acute pain  Pain Onset More than a month ago    Pain Frequency Constant    Aggravating Factors  movement and use    Pain Relieving Factors pain medication, cold helps the most, heat    Effect of Pain on Daily Activities max effect    Multiple Pain Sites No              [facility] OT Assessment - 10/12/20 1547      Assessment   Medical Diagnosis Right RTC tear      Precautions   Precautions None                    OT Treatments/Exercises (OP) - 10/12/20 1417      Exercises   Exercises Shoulder      Shoulder Exercises: Supine   Protraction PROM;5 reps;AROM;10 reps    Horizontal ABduction PROM;5 reps;AROM;10 reps    External Rotation PROM;5 reps;AROM;10 reps    Internal Rotation PROM;5 reps;AROM;10 reps     Flexion PROM;5 reps;AROM;10 reps    ABduction PROM;5 reps      Shoulder Exercises: Isometric Strengthening   Flexion Other (comment)   2x10" standing   Extension Other (comment)   1x10"   External Rotation Other (comment)   2x10"   Internal Rotation Other (comment)   2x10"   ABduction Other (comment)   3x10"   ADduction Other (comment)   2x10"     Manual Therapy   Manual Therapy Myofascial release    Manual therapy comments completed separately from therapeutic exercises    Myofascial Release Myofasical release and manual stretching completed to right upper arm, trapezius, and scapularis region to decrease fascial restrictions and increase joint mobility in a pain free zone.                    OT Short Term Goals - 10/07/20 1512      OT SHORT TERM GOAL #1   Title Patient will be educated and independent with HEP to faciliate progress in therapy and allow her to return to using her RUE as her dominant extremity for all daily and work activities.     Time 4    Period Weeks    Status On-going    Target Date 10/27/20      OT SHORT TERM GOAL #2   Title Patient will increase RUE P/ROM to WNL in order to complete upper body dressing tasks with less difficulty.    Time 4    Period Weeks    Status On-going      OT SHORT TERM GOAL #3   Title Pt will increase RUE strength to 3/5 in order to complete waist level tasks with less difficulty.    Time 4    Period Weeks    Status On-going      OT SHORT TERM GOAL #4   Title Patient will report a decrease in pain level of 3/10 when completing daily tasks with her RUE.    Time 4    Period Weeks    Status On-going      OT SHORT TERM GOAL #5   Title Patient will demonstrate fascial restrictions of min amount or less in her RUE in order to increase functional mobility needed to complete reaching tasks.    Time 4    Period Weeks    Status On-going      OT SHORT TERM GOAL #6   Title Patient will increase RUE A/ROM to [facility] in  order to be able to reach to shoulder level or above when completing work and daily tasks.    Time 4    Period Weeks    Status On-going                    Plan - 10/12/20 1544    Clinical Impression Statement A: Large trigger point from last session not present during session. Complete minimal manual techniques to right anterior region of shoulder due to reports of nerve sensitivity and discomfort. Completed supine A/ROM minus abduction with VC for form and technique provided to increase shoulder stability. Complete standing isometric shoulder exercises. Discomfort felt during shoulder abduction.    Body Structure / Function / Physical Skills ADL;UE functional use;Fascial restriction;Pain;ROM;Strength    Plan P: Update HEP. Supine A/ROM. Attempt standing AA/ROM. PVC pipe slide.    Consulted and Agree with Plan of Care Patient           Patient will benefit from skilled therapeutic intervention in order to improve the following deficits and impairments:   Body Structure / Function / Physical Skills: ADL,UE functional use,Fascial restriction,Pain,ROM,Strength       Visit Diagnosis: Acute pain of right shoulder  Other symptoms and signs involving the musculoskeletal system  Stiffness of right shoulder, not elsewhere classified    Problem List Patient Active Problem List   Diagnosis Date Noted  . PAD (peripheral artery disease) (HCC) 07/13/2020  . S/P right rotator cuff repair open 09/19/18 repaired again 08/05/19 10/04/2018  . Complete tear of right rotator cuff   . Chronic pain of left knee   . Left shoulder pain 09/21/2015  . Disorders of bursae and tendons in shoulder region, unspecified 09/25/2013    Rebeccah Ivins, OTR/L,CBIS  [phone]  10/12/2020, 3:48 PM  [facility] [address] [place], [place], [address] Phone: [phone]   Fax:  [phone]  Name: Tina Pham MRN: [id number] Date of Birth:  [birth year]

## 2020-10-14 ENCOUNTER — Other Ambulatory Visit: Payer: Self-pay

## 2020-10-14 ENCOUNTER — Encounter (HOSPITAL_COMMUNITY): Payer: Self-pay

## 2020-10-14 ENCOUNTER — Ambulatory Visit (HOSPITAL_COMMUNITY): Payer: BLUE CROSS/BLUE SHIELD

## 2020-10-14 DIAGNOSIS — M25611 Stiffness of right shoulder, not elsewhere classified: Secondary | ICD-10-CM

## 2020-10-14 DIAGNOSIS — M25511 Pain in right shoulder: Secondary | ICD-10-CM

## 2020-10-14 DIAGNOSIS — R29898 Other symptoms and signs involving the musculoskeletal system: Secondary | ICD-10-CM

## 2020-10-14 NOTE — Therapy (Signed)
[facility] [address] [place], [place], [address] Phone: [phone]   Fax:  [phone]  Occupational Therapy Treatment  Patient Details  Name: Tina Pham MRN: [id number] Date of Birth: March 10, 1976 Referring Provider (OT): Fuller Canada, MD   Encounter Date: 10/14/2020   OT End of Session - 10/14/20 1454    Visit Number 4    Number of Visits 8    Date for OT Re-Evaluation 10/27/20    Authorization Type [employer] $50 copay 30 visits limit OT/PT/SLP    Authorization - Visit Number 4    Authorization - Number of Visits 30    OT Start Time 1345    OT Stop Time 1426    OT Time Calculation (min) 41 min    Activity Tolerance Patient tolerated treatment well    Behavior During Therapy [facility] for tasks assessed/performed           Past Medical History:  Diagnosis Date  . Anxiety    panic attacks  . Arthritis   . Chronic back pain   . Chronic bronchitis (HCC)   . Depression   . Diabetes mellitus   . GERD (gastroesophageal reflux disease)    prn tums  . High cholesterol   . History of kidney stones    passed ?  Marland Kitchen Hypertension    no longer on BP medications  . Migraine   . Neuropathic pain of foot   . Plantar fasciitis   . Pneumonia 2011ish  . Sciatica   . Torn rotator cuff     Past Surgical History:  Procedure Laterality Date  . ABDOMINAL AORTOGRAM W/LOWER EXTREMITY N/A 07/01/2020   Procedure: ABDOMINAL AORTOGRAM W/LOWER EXTREMITY;  Surgeon: Cephus Shelling, MD;  Location: [facility] INVASIVE CV LAB;  Service: Cardiovascular;  Laterality: N/A;  . AMPUTATION Left 07/22/2020   Procedure: LEFT GREAT TOE AMPUTATION;  Surgeon: Maeola Harman, MD;  Location: [facility] OR;  Service: Vascular;  Laterality: Left;  . BACK SURGERY     herniated disc; had disc removed and then fusion; total of 3 surgeries  . EYE SURGERY Bilateral    removal of cyst and straightening of muscles  . KNEE ARTHROSCOPY WITH MEDIAL MENISECTOMY Left  12/27/2016   Procedure: LEFT KNEE DIAGNOSTIC ARTHROSCOPY;  Surgeon: Vickki Hearing, MD;  Location: [facility];  Service: Orthopedics;  Laterality: Left;  . PERIPHERAL VASCULAR INTERVENTION Left 07/01/2020   Procedure: PERIPHERAL VASCULAR INTERVENTION;  Surgeon: Cephus Shelling, MD;  Location: [facility] INVASIVE CV LAB;  Service: Cardiovascular;  Laterality: Left;  common iliac  . SHOULDER OPEN ROTATOR CUFF REPAIR Right 09/19/2018   Procedure: ROTATOR CUFF REPAIR SHOULDER OPEN;  Surgeon: Vickki Hearing, MD;  Location: [facility];  Service: Orthopedics;  Laterality: Right;  . SHOULDER OPEN ROTATOR CUFF REPAIR Right 08/05/2019   Procedure: ROTATOR CUFF REPAIR SHOULDER OPEN;  Surgeon: Vickki Hearing, MD;  Location: [facility]S;  Service: Orthopedics;  Laterality: Right;  . TUBAL LIGATION      There were no vitals filed for this visit.   Subjective Assessment - 10/14/20 1405    Subjective  S: My pain is like the weather. It's bipolar.    Currently in Pain? Yes    Pain Score 7     Pain Location Shoulder    Pain Orientation Right    Pain Descriptors / Indicators Aching;Shooting;Sore;Sharp;Constant    Pain Type Acute pain    Pain Onset More than a month ago    Pain  Frequency Constant    Aggravating Factors  movement and use    Pain Relieving Factors pain medication, cold helps the most, heat    Effect of Pain on Daily Activities max effect    Multiple Pain Sites No              [facility] OT Assessment - 10/14/20 1406      Assessment   Medical Diagnosis Right RTC tear      Precautions   Precautions None                    OT Treatments/Exercises (OP) - 10/14/20 1407      Exercises   Exercises Shoulder      Shoulder Exercises: Supine   Protraction PROM;5 reps;AROM;10 reps    Horizontal ABduction PROM;5 reps;AROM;10 reps    External Rotation PROM;5 reps;AROM;10 reps    Internal Rotation PROM;5 reps;AROM;10 reps    Flexion PROM;5 reps;AROM;10 reps    ABduction PROM;5 reps       Shoulder Exercises: Standing   Protraction AAROM;10 reps    Horizontal ABduction AAROM;10 reps    External Rotation AAROM;10 reps    Internal Rotation AAROM;10 reps    Flexion AAROM;10 reps    ABduction AAROM;10 reps      Shoulder Exercises: Pulleys   Flexion 1 minute   standing   ABduction 1 minute   standing     Shoulder Exercises: ROM/Strengthening   Other ROM/Strengthening Exercises PVC pipe slide; 10X flexion      Manual Therapy   Manual Therapy Myofascial release    Manual therapy comments completed separately from therapeutic exercises    Myofascial Release Myofasical release and manual stretching completed to right upper arm, trapezius, and scapularis region to decrease fascial restrictions and increase joint mobility in a pain free zone.                  OT Education - 10/14/20 1423    Education Details stop previous HEP provided. Start AA/ROM standing and A/ROM supine.    Person(s) Educated Patient    Methods Explanation;Demonstration;Handout;Verbal cues    Comprehension Verbalized understanding;Returned demonstration            OT Short Term Goals - 10/07/20 1512      OT SHORT TERM GOAL #1   Title Patient will be educated and independent with HEP to faciliate progress in therapy and allow her to return to using her RUE as her dominant extremity for all daily and work activities.     Time 4    Period Weeks    Status On-going    Target Date 10/27/20      OT SHORT TERM GOAL #2   Title Patient will increase RUE P/ROM to WNL in order to complete upper body dressing tasks with less difficulty.    Time 4    Period Weeks    Status On-going      OT SHORT TERM GOAL #3   Title Pt will increase RUE strength to 3/5 in order to complete waist level tasks with less difficulty.    Time 4    Period Weeks    Status On-going      OT SHORT TERM GOAL #4   Title Patient will report a decrease in pain level of 3/10 when completing daily tasks with her RUE.     Time 4    Period Weeks    Status On-going      OT SHORT TERM  GOAL #5   Title Patient will demonstrate fascial restrictions of min amount or less in her RUE in order to increase functional mobility needed to complete reaching tasks.    Time 4    Period Weeks    Status On-going      OT SHORT TERM GOAL #6   Title Patient will increase RUE A/ROM to [facility] in order to be able to reach to shoulder level or above when completing work and daily tasks.    Time 4    Period Weeks    Status On-going                    Plan - 10/14/20 1456    Clinical Impression Statement A: manual techniques completed to address fascial restrictions in the right upper arm, and upper trapezius region. Completed supine A/ROM and AA/ROM standing with HEP updated. Added pulleys and PVC pipe slide to increase functional mobility. Pain level continues to be elevated at each session and remains at a 7/10. VC for form and technique.    Body Structure / Function / Physical Skills ADL;UE functional use;Fascial restriction;Pain;ROM;Strength    Plan P: Follow up on HEO. If pain continues to be elevated may want to try ES and heat versus manual techniques. wall wash.    OT Home Exercise Plan eval: table slides 3/24: Supine A/ROM, Standing AA/ROM    Consulted and Agree with Plan of Care Patient           Patient will benefit from skilled therapeutic intervention in order to improve the following deficits and impairments:   Body Structure / Function / Physical Skills: ADL,UE functional use,Fascial restriction,Pain,ROM,Strength       Visit Diagnosis: Other symptoms and signs involving the musculoskeletal system  Stiffness of right shoulder, not elsewhere classified  Acute pain of right shoulder    Problem List Patient Active Problem List   Diagnosis Date Noted  . PAD (peripheral artery disease) (HCC) 07/13/2020  . S/P right rotator cuff repair open 09/19/18 repaired again 08/05/19 10/04/2018  . Complete  tear of right rotator cuff   . Chronic pain of left knee   . Left shoulder pain 09/21/2015  . Disorders of bursae and tendons in shoulder region, unspecified 09/25/2013    Naraya Stoneberg, OTR/L,CBIS  [phone]  10/14/2020, 3:06 PM  [facility] [address] [place], [place], [address] Phone: [phone]   Fax:  [phone]  Name: KAYLANNI EZELLE MRN: [id number] Date of Birth: 09/05/1975

## 2020-10-14 NOTE — Patient Instructions (Signed)
Perform each exercise ____10-15____ reps. 2-3x days.    Completed STANDING:  1) Protraction   Start by holding a wand or cane at chest height.  Next, slowly push the wand outwards in front of your body so that your elbows become fully straightened. Then, return to the original position.     2) Shoulder FLEXION   In the standing position, hold a wand/cane with both arms, palms down on both sides. Raise up the wand/cane allowing your unaffected arm to perform most of the effort. Your affected arm should be partially relaxed.        4) Shoulder ABDUCTION   While holding a wand/cane palm face up on the injured side and palm face down on the uninjured side, slowly raise up your injured arm to the side.        5) Horizontal Abduction/Adduction      Straight arms holding cane at shoulder height, bring cane to right, center, left. Repeat starting to left.   Copyright  VHI. All rights reserved.      Complete LAYING DOWN:  Repeat all exercises 10-15 times, 1-2 times per day.  1) Shoulder Protraction    Begin with elbows by your side, slowly "punch" straight out in front of you.      2) Shoulder Flexion  Supine:             Begin with arms at your side with thumbs pointed up, slowly raise both arms up and forward towards overhead.      3) Horizontal abduction/adduction  Supine:           Begin with arms straight out in front of you, bring out to the side in at "T" shape. Keep arms straight entire time.           4) Internal & External Rotation   Supine:     Standing:     Stand with elbows at the side and elbows bent 90 degrees. Move your forearms away from your body, then bring back inward toward the body.     5) Shoulder Abduction  Supine:           Lying on your back begin with your arms flat on the table next to your side. Slowly move your arms out to the side so that they go overhead, in a jumping jack or snow angel  movement.

## 2020-10-18 ENCOUNTER — Other Ambulatory Visit: Payer: Self-pay

## 2020-10-18 DIAGNOSIS — S46011A Strain of muscle(s) and tendon(s) of the rotator cuff of right shoulder, initial encounter: Secondary | ICD-10-CM

## 2020-10-18 MED ORDER — HYDROCODONE-ACETAMINOPHEN 5-325 MG PO TABS: 1.0000 | ORAL_TABLET | Freq: Three times a day (TID) | ORAL | 0 refills | Status: DC | PRN

## 2020-10-19 ENCOUNTER — Ambulatory Visit (HOSPITAL_COMMUNITY): Payer: BLUE CROSS/BLUE SHIELD

## 2020-10-21 ENCOUNTER — Other Ambulatory Visit: Payer: Self-pay

## 2020-10-21 ENCOUNTER — Ambulatory Visit (INDEPENDENT_AMBULATORY_CARE_PROVIDER_SITE_OTHER): Payer: BLUE CROSS/BLUE SHIELD | Admitting: Orthopedic Surgery

## 2020-10-21 ENCOUNTER — Encounter: Payer: Self-pay | Admitting: Orthopedic Surgery

## 2020-10-21 ENCOUNTER — Ambulatory Visit (HOSPITAL_COMMUNITY): Payer: BLUE CROSS/BLUE SHIELD

## 2020-10-21 VITALS — BP 132/79 | HR 101 | Ht 68.0 in | Wt 231.0 lb

## 2020-10-21 DIAGNOSIS — M25611 Stiffness of right shoulder, not elsewhere classified: Secondary | ICD-10-CM | POA: Diagnosis not present

## 2020-10-21 DIAGNOSIS — M25511 Pain in right shoulder: Secondary | ICD-10-CM

## 2020-10-21 DIAGNOSIS — R29898 Other symptoms and signs involving the musculoskeletal system: Secondary | ICD-10-CM

## 2020-10-21 DIAGNOSIS — S46011D Strain of muscle(s) and tendon(s) of the rotator cuff of right shoulder, subsequent encounter: Secondary | ICD-10-CM | POA: Diagnosis not present

## 2020-10-21 NOTE — Patient Instructions (Signed)
Go to 2 therapy appts   Then maintain home

## 2020-10-21 NOTE — Therapy (Signed)
[facility] [address] [place], [place], [address] Phone: [phone]   Fax:  [phone]  Occupational Therapy Treatment  Patient Details  Name: Tina Pham MRN: [id number] Date of Birth: Mar 22, 1976 Referring Provider (OT): Fuller Canada, MD   AROM  Overall AROM Comments Assessed seated. IR/er adducted   AROM Assessment Site Shoulder   Right Shoulder Flexion 140 Degrees   previous: 106  Right Shoulder ABduction 140 Degrees   previous: 88  Right Shoulder Internal Rotation 90 Degrees   previous: same  Right Shoulder External Rotation 69 Degrees   previous: 53    PROM  Overall PROM Comments Assessed supine. IR/er adducted   PROM Assessment Site Shoulder   Right/Left Shoulder Right   Right Shoulder Flexion 140 Degrees   previous: 120  Right Shoulder ABduction 154 Degrees   previous: 125  Right Shoulder Internal Rotation 90 Degrees   previous: same  Right Shoulder External Rotation 60 Degrees   previous: 45    Strength  Overall Strength Comments Assessed IR/er adducted   Strength Assessment Site Shoulder   Right/Left Shoulder Right   Right Shoulder Flexion 3+/5   previous: 3-/5  Right Shoulder ABduction 4/5   previous: 3-/5  Right Shoulder Internal Rotation 5/5   previous: 3/5  Right Shoulder External Rotation 4/5   previous: 3-/5    Encounter Date: 10/21/2020   OT End of Session - 10/21/20 1336    Visit Number 5    Number of Visits 8    Date for OT Re-Evaluation 10/27/20    Authorization Type [employer] $50 copay 30 visits limit OT/PT/SLP    Authorization - Visit Number 5    Authorization - Number of Visits 30    OT Start Time 1304    OT Stop Time 1339    OT Time Calculation (min) 35 min    Activity Tolerance Patient tolerated treatment well    Behavior During Therapy WFL for tasks assessed/performed           Past Medical History:  Diagnosis Date  . Anxiety    panic attacks  . Arthritis   . Chronic back  pain   . Chronic bronchitis (HCC)   . Depression   . Diabetes mellitus   . GERD (gastroesophageal reflux disease)    prn tums  . High cholesterol   . History of kidney stones    passed ?  Marland Kitchen Hypertension    no longer on BP medications  . Migraine   . Neuropathic pain of foot   . Plantar fasciitis   . Pneumonia 2011ish  . Sciatica   . Torn rotator cuff     Past Surgical History:  Procedure Laterality Date  . ABDOMINAL AORTOGRAM W/LOWER EXTREMITY N/A 07/01/2020   Procedure: ABDOMINAL AORTOGRAM W/LOWER EXTREMITY;  Surgeon: Cephus Shelling, MD;  Location: [facility] INVASIVE CV LAB;  Service: Cardiovascular;  Laterality: N/A;  . AMPUTATION Left 07/22/2020   Procedure: LEFT GREAT TOE AMPUTATION;  Surgeon: Maeola Harman, MD;  Location: [facility] OR;  Service: Vascular;  Laterality: Left;  . BACK SURGERY     herniated disc; had disc removed and then fusion; total of 3 surgeries  . EYE SURGERY Bilateral    removal of cyst and straightening of muscles  . KNEE ARTHROSCOPY WITH MEDIAL MENISECTOMY Left 12/27/2016   Procedure: LEFT KNEE DIAGNOSTIC ARTHROSCOPY;  Surgeon: Vickki Hearing, MD;  Location: [facility] ORS;  Service: Orthopedics;  Laterality: Left;  . PERIPHERAL VASCULAR INTERVENTION  Left 07/01/2020   Procedure: PERIPHERAL VASCULAR INTERVENTION;  Surgeon: Cephus Shelling, MD;  Location: [facility] INVASIVE CV LAB;  Service: Cardiovascular;  Laterality: Left;  common iliac  . SHOULDER OPEN ROTATOR CUFF REPAIR Right 09/19/2018   Procedure: ROTATOR CUFF REPAIR SHOULDER OPEN;  Surgeon: Vickki Hearing, MD;  Location: [facility] ORS;  Service: Orthopedics;  Laterality: Right;  . SHOULDER OPEN ROTATOR CUFF REPAIR Right 08/05/2019   Procedure: ROTATOR CUFF REPAIR SHOULDER OPEN;  Surgeon: Vickki Hearing, MD;  Location: [facility] ORS;  Service: Orthopedics;  Laterality: Right;  . TUBAL LIGATION      There were no vitals filed for this visit.   Subjective Assessment - 10/21/20 1322    Subjective  S:  There is mariginal improvement with moving it but the pain is always there.    Currently in Pain? Yes    Pain Score 7     Pain Location Shoulder    Pain Orientation Right    Pain Descriptors / Indicators Aching;Shooting;Sore;Constant    Pain Type Acute pain    Pain Onset More than a month ago    Pain Frequency Constant    Aggravating Factors  movement and use    Pain Relieving Factors pain medication, cold helps more than heat    Effect of Pain on Daily Activities max effect    Multiple Pain Sites No              [facility] OT Assessment - 10/21/20 1308      Assessment   Medical Diagnosis Right RTC tear      Precautions   Precautions None      Palpation   Palpation comment moderate fascial restrictions in the left upper arm, upper trapezius, supraspinatus region.      AROM   Overall AROM Comments Assessed seated. IR/er adducted    AROM Assessment Site Shoulder    Right Shoulder Flexion 140 Degrees   previous: 106   Right Shoulder ABduction 140 Degrees   previous: 88   Right Shoulder Internal Rotation 90 Degrees   previous: same   Right Shoulder External Rotation 69 Degrees   previous: 53     PROM   Overall PROM Comments Assessed supine. IR/er adducted    PROM Assessment Site Shoulder    Right/Left Shoulder Right    Right Shoulder Flexion 140 Degrees   previous: 120   Right Shoulder ABduction 154 Degrees   previous: 125   Right Shoulder Internal Rotation 90 Degrees   previous: same   Right Shoulder External Rotation 60 Degrees   previous: 45     Strength   Overall Strength Comments Assessed IR/er adducted    Strength Assessment Site Shoulder    Right/Left Shoulder Right    Right Shoulder Flexion 3+/5   previous: 3-/5   Right Shoulder ABduction 4/5   previous: 3-/5   Right Shoulder Internal Rotation 5/5   previous: 3/5   Right Shoulder External Rotation 4/5   previous: 3-/5                   OT Treatments/Exercises (OP) - 10/21/20 1320      Exercises    Exercises Shoulder      Shoulder Exercises: Supine   Protraction PROM;5 reps    Horizontal ABduction PROM    External Rotation PROM;5 reps    Internal Rotation PROM;5 reps    Flexion PROM;5 reps    ABduction PROM;5 reps      Shoulder Exercises: Standing  Horizontal ABduction --    External Rotation AROM;10 reps    Internal Rotation AROM;10 reps    Flexion AAROM;10 reps    ABduction AAROM;10 reps      Shoulder Exercises: Pulleys   Flexion 1 minute   standing   ABduction 1 minute   standing     Shoulder Exercises: ROM/Strengthening   UBE (Upper Arm Bike) Level 1 1' flexion 1' abduction   pace: 3.0-4.0   Other ROM/Strengthening Exercises PVC pipe slide; 10X flexion                    OT Short Term Goals - 10/07/20 1512      OT SHORT TERM GOAL #1   Title Patient will be educated and independent with HEP to faciliate progress in therapy and allow her to return to using her RUE as her dominant extremity for all daily and work activities.     Time 4    Period Weeks    Status On-going    Target Date 10/27/20      OT SHORT TERM GOAL #2   Title Patient will increase RUE P/ROM to WNL in order to complete upper body dressing tasks with less difficulty.    Time 4    Period Weeks    Status On-going      OT SHORT TERM GOAL #3   Title Pt will increase RUE strength to 3/5 in order to complete waist level tasks with less difficulty.    Time 4    Period Weeks    Status On-going      OT SHORT TERM GOAL #4   Title Patient will report a decrease in pain level of 3/10 when completing daily tasks with her RUE.    Time 4    Period Weeks    Status On-going      OT SHORT TERM GOAL #5   Title Patient will demonstrate fascial restrictions of min amount or less in her RUE in order to increase functional mobility needed to complete reaching tasks.    Time 4    Period Weeks    Status On-going      OT SHORT TERM GOAL #6   Title Patient will increase RUE A/ROM to [facility] in order to  be able to reach to shoulder level or above when completing work and daily tasks.    Time 4    Period Weeks    Status On-going                    Plan - 10/21/20 1336    Clinical Impression Statement A: measurements taken for MD appointment this afternoon. Patient has made improvement with Active and Passive ROM as well as strength. Pain level remains consistently at 7 since starting therapy. Focused on Active assisted movement during session. VC for form and technique. Pain monitored throughout session.    Body Structure / Function / Physical Skills ADL;UE functional use;Fascial restriction;Pain;ROM;Strength    Plan P: follow up on MD appointment. May want to try ES and heat for pain management versus myofascial release. Functional reaching tasks.    Consulted and Agree with Plan of Care Patient           Patient will benefit from skilled therapeutic intervention in order to improve the following deficits and impairments:   Body Structure / Function / Physical Skills: ADL,UE functional use,Fascial restriction,Pain,ROM,Strength       Visit Diagnosis: Acute pain of right shoulder  Stiffness of right shoulder, not elsewhere classified  Other symptoms and signs involving the musculoskeletal system    Problem List Patient Active Problem List   Diagnosis Date Noted  . PAD (peripheral artery disease) (HCC) 07/13/2020  . S/P right rotator cuff repair open 09/19/18 repaired again 08/05/19 10/04/2018  . Complete tear of right rotator cuff   . Chronic pain of left knee   . Left shoulder pain 09/21/2015  . Disorders of bursae and tendons in shoulder region, unspecified 09/25/2013    Limmie PatriciaLaura Yelina Sarratt, OTR/L,CBIS  [phone]  10/21/2020, 1:42 PM  [facility] [address] [place], [place], [address] Phone: [phone]   Fax:  [phone]  Name: Suzette BattiestLaura W Gunnoe MRN: [id number] Date of Birth: 12/18/1975

## 2020-10-21 NOTE — Progress Notes (Signed)
AROM  Overall AROM Comments Assessed seated. IR/er adducted   AROM Assessment Site Shoulder   Right Shoulder Flexion 140 Degrees   previous: 106  Right Shoulder ABduction 140 Degrees   previous: 88  Right Shoulder Internal Rotation 90 Degrees   previous: same  Right Shoulder External Rotation 69 Degrees   previous: 53     PROM  Overall PROM Comments Assessed supine. IR/er adducted   PROM Assessment Site Shoulder   Right/Left Shoulder Right   Right Shoulder Flexion 140 Degrees   previous: 120  Right Shoulder ABduction 154 Degrees   previous: 125  Right Shoulder Internal Rotation 90 Degrees   previous: same  Right Shoulder External Rotation 60 Degrees   previous: 45     Strength  Overall Strength Comments Assessed IR/er adducted   Strength Assessment Site Shoulder   Right/Left Shoulder Right   Right Shoulder Flexion 3+/5   previous: 3-/5  Right Shoulder ABduction 4/5   previous: 3-/5  Right Shoulder Internal Rotation 5/5   previous: 3/5  Right Shoulder External Rotation 4/5   previous: 3-/5   Chief Complaint  Patient presents with  . Shoulder Pain    Rt shoulder     45 year old female with a retear of her previously repaired rotator cuff x2 came in with recent MRI showing a 1-1/2 cm tear of the supraspinatus.  However on her last visit she had terrible motion and we sent her to physical therapy and those improvements are listed above.  She has intermittent pain improved motion but still has weakness when the arm is away from her body.  We also note that she saw Dr. Arbie Cookey who recommended to delay surgery if possible secondary to a stent for which she is on Plavix please see his note  She agrees that it would be better to delay the surgery and we do have a chance that she may improve without any surgery.  Recommend continuing with her pain management physical therapy convert to a home program and follow-up in a month to check on her progress  Encounter Diagnosis  Name Primary?   . Traumatic tear of right rotator cuff, unspecified tear extent, subsequent encounter Yes

## 2020-10-24 ENCOUNTER — Other Ambulatory Visit: Payer: Self-pay

## 2020-10-24 DIAGNOSIS — S46011A Strain of muscle(s) and tendon(s) of the rotator cuff of right shoulder, initial encounter: Secondary | ICD-10-CM

## 2020-10-25 MED ORDER — HYDROCODONE-ACETAMINOPHEN 5-325 MG PO TABS: 1.0000 | ORAL_TABLET | Freq: Three times a day (TID) | ORAL | 0 refills | Status: DC | PRN

## 2020-10-26 ENCOUNTER — Ambulatory Visit (HOSPITAL_COMMUNITY): Payer: BLUE CROSS/BLUE SHIELD

## 2020-10-28 ENCOUNTER — Ambulatory Visit (HOSPITAL_COMMUNITY): Payer: BLUE CROSS/BLUE SHIELD | Attending: Orthopedic Surgery | Admitting: Occupational Therapy

## 2020-11-01 ENCOUNTER — Other Ambulatory Visit: Payer: Self-pay

## 2020-11-01 DIAGNOSIS — S46011A Strain of muscle(s) and tendon(s) of the rotator cuff of right shoulder, initial encounter: Secondary | ICD-10-CM

## 2020-11-01 MED ORDER — HYDROCODONE-ACETAMINOPHEN 5-325 MG PO TABS: 1.0000 | ORAL_TABLET | Freq: Three times a day (TID) | ORAL | 0 refills | Status: DC | PRN

## 2020-11-08 ENCOUNTER — Other Ambulatory Visit: Payer: Self-pay

## 2020-11-08 DIAGNOSIS — S46011A Strain of muscle(s) and tendon(s) of the rotator cuff of right shoulder, initial encounter: Secondary | ICD-10-CM

## 2020-11-08 MED ORDER — HYDROCODONE-ACETAMINOPHEN 5-325 MG PO TABS: 1.0000 | ORAL_TABLET | Freq: Three times a day (TID) | ORAL | 0 refills | Status: DC | PRN

## 2020-11-14 ENCOUNTER — Other Ambulatory Visit: Payer: Self-pay

## 2020-11-14 DIAGNOSIS — S46011A Strain of muscle(s) and tendon(s) of the rotator cuff of right shoulder, initial encounter: Secondary | ICD-10-CM

## 2020-11-15 MED ORDER — HYDROCODONE-ACETAMINOPHEN 5-325 MG PO TABS: 1.0000 | ORAL_TABLET | Freq: Three times a day (TID) | ORAL | 0 refills | Status: DC | PRN

## 2020-11-21 ENCOUNTER — Other Ambulatory Visit: Payer: Self-pay

## 2020-11-21 DIAGNOSIS — S46011A Strain of muscle(s) and tendon(s) of the rotator cuff of right shoulder, initial encounter: Secondary | ICD-10-CM

## 2020-11-22 ENCOUNTER — Ambulatory Visit (INDEPENDENT_AMBULATORY_CARE_PROVIDER_SITE_OTHER): Payer: BC Managed Care – PPO | Admitting: Orthopedic Surgery

## 2020-11-22 ENCOUNTER — Other Ambulatory Visit: Payer: Self-pay

## 2020-11-22 ENCOUNTER — Encounter: Payer: Self-pay | Admitting: Orthopedic Surgery

## 2020-11-22 VITALS — BP 185/101 | HR 90 | Ht 68.5 in | Wt 227.4 lb

## 2020-11-22 DIAGNOSIS — G8929 Other chronic pain: Secondary | ICD-10-CM | POA: Diagnosis not present

## 2020-11-22 DIAGNOSIS — Z9889 Other specified postprocedural states: Secondary | ICD-10-CM | POA: Diagnosis not present

## 2020-11-22 DIAGNOSIS — M19011 Primary osteoarthritis, right shoulder: Secondary | ICD-10-CM | POA: Diagnosis not present

## 2020-11-22 MED ORDER — HYDROCODONE-ACETAMINOPHEN 7.5-325 MG PO TABS: 1.0000 | ORAL_TABLET | Freq: Four times a day (QID) | ORAL | 0 refills | Status: DC | PRN

## 2020-11-22 MED ORDER — HYDROCODONE-ACETAMINOPHEN 7.5-325 MG PO TABS
1.0000 | ORAL_TABLET | Freq: Four times a day (QID) | ORAL | 0 refills | Status: DC | PRN
Start: 2020-11-22 — End: 2020-11-29

## 2020-11-22 NOTE — Progress Notes (Addendum)
Chief Complaint  Patient presents with  . Shoulder Pain    R/ hurting real bad.   45 year old female has had 2 rotator cuff repairs 24 August 2018 and then again on January 2021 she had another injury to the right shoulder with a probable rotator cuff repair and then a symptomatic AC joint she is not doing well with tapering of her hydrocodone.  She seems to be symptomatic from the [facility] joint.  However, she has a blood clot and was just stented and vascular surgery recommends that we put off the surgery as long as possible which I would think 6 months would be the right time frame  That would put [place] in September so I will see her in August I will increase her pain medication up until that time  Meds ordered this encounter  Medications  . DISCONTD: HYDROcodone-acetaminophen (NORCO) 7.5-325 MG tablet    Sig: Take 1 tablet by mouth every 6 (six) hours as needed for moderate pain.    Dispense:  28 tablet    Refill:  0  . HYDROcodone-acetaminophen (NORCO) 7.5-325 MG tablet    Sig: Take 1 tablet by mouth every 6 (six) hours as needed for moderate pain.    Dispense:  28 tablet    Refill:  0

## 2020-11-22 NOTE — Patient Instructions (Signed)

## 2020-11-29 ENCOUNTER — Other Ambulatory Visit: Payer: Self-pay

## 2020-11-29 MED ORDER — HYDROCODONE-ACETAMINOPHEN 7.5-325 MG PO TABS: 1.0000 | ORAL_TABLET | Freq: Four times a day (QID) | ORAL | 0 refills | Status: DC | PRN

## 2020-12-05 ENCOUNTER — Other Ambulatory Visit: Payer: Self-pay

## 2020-12-06 MED ORDER — HYDROCODONE-ACETAMINOPHEN 7.5-325 MG PO TABS: 1.0000 | ORAL_TABLET | Freq: Four times a day (QID) | ORAL | 0 refills | Status: DC | PRN

## 2020-12-12 ENCOUNTER — Other Ambulatory Visit: Payer: Self-pay

## 2020-12-13 MED ORDER — HYDROCODONE-ACETAMINOPHEN 7.5-325 MG PO TABS: 1.0000 | ORAL_TABLET | Freq: Four times a day (QID) | ORAL | 0 refills | Status: DC | PRN

## 2020-12-20 ENCOUNTER — Other Ambulatory Visit: Payer: Self-pay

## 2020-12-21 MED ORDER — HYDROCODONE-ACETAMINOPHEN 7.5-325 MG PO TABS: 1.0000 | ORAL_TABLET | Freq: Four times a day (QID) | ORAL | 0 refills | Status: DC | PRN

## 2020-12-27 ENCOUNTER — Other Ambulatory Visit: Payer: Self-pay

## 2020-12-27 MED ORDER — HYDROCODONE-ACETAMINOPHEN 7.5-325 MG PO TABS: 1.0000 | ORAL_TABLET | Freq: Four times a day (QID) | ORAL | 0 refills | Status: DC | PRN

## 2021-01-02 ENCOUNTER — Other Ambulatory Visit: Payer: Self-pay

## 2021-01-03 ENCOUNTER — Other Ambulatory Visit: Payer: Self-pay

## 2021-01-04 MED ORDER — HYDROCODONE-ACETAMINOPHEN 7.5-325 MG PO TABS: 1.0000 | ORAL_TABLET | Freq: Four times a day (QID) | ORAL | 0 refills | Status: DC | PRN

## 2021-01-10 ENCOUNTER — Other Ambulatory Visit: Payer: Self-pay

## 2021-01-10 MED ORDER — HYDROCODONE-ACETAMINOPHEN 7.5-325 MG PO TABS: 1.0000 | ORAL_TABLET | Freq: Four times a day (QID) | ORAL | 0 refills | Status: DC | PRN

## 2021-01-16 ENCOUNTER — Other Ambulatory Visit: Payer: Self-pay

## 2021-01-17 MED ORDER — HYDROCODONE-ACETAMINOPHEN 7.5-325 MG PO TABS: 1.0000 | ORAL_TABLET | Freq: Four times a day (QID) | ORAL | 0 refills | Status: DC | PRN

## 2021-01-20 ENCOUNTER — Other Ambulatory Visit: Payer: Self-pay

## 2021-01-25 ENCOUNTER — Other Ambulatory Visit: Payer: Self-pay

## 2021-01-26 ENCOUNTER — Encounter (HOSPITAL_COMMUNITY): Payer: Self-pay

## 2021-01-26 MED ORDER — HYDROCODONE-ACETAMINOPHEN 7.5-325 MG PO TABS: 1.0000 | ORAL_TABLET | Freq: Four times a day (QID) | ORAL | 0 refills | Status: DC | PRN

## 2021-01-26 NOTE — Therapy (Signed)
[place], [place], [address] Phone: [phone]   Fax:  [phone]  Patient Details  Name: Tina Pham MRN: [id number] Date of Birth: 25-Apr-1976 Referring Provider:  No ref. provider found  Encounter Date: 01/26/2021  OCCUPATIONAL THERAPY DISCHARGE SUMMARY  Visits from Start of Care: 5  Current functional level related to goals / functional outcomes: Patient did not return after reassessment completed on 10/21/20. Pt will be discharged from therapy caseload.  OT SHORT TERM GOAL #1     Title Patient will be educated and independent with HEP to faciliate progress in therapy and allow her to return to using her RUE as her dominant extremity for all daily and work activities.      Time 4    Period Weeks    Status On-going    Target Date 10/27/20         OT SHORT TERM GOAL #2    Title Patient will increase RUE P/ROM to WNL in order to complete upper body dressing tasks with less difficulty.    Time 4    Period Weeks    Status On-going         OT SHORT TERM GOAL #3    Title Pt will increase RUE strength to 3/5 in order to complete waist level tasks with less difficulty.    Time 4    Period Weeks    Status On-going         OT SHORT TERM GOAL #4    Title Patient will report a decrease in pain level of 3/10 when completing daily tasks with her RUE.    Time 4    Period Weeks    Status On-going         OT SHORT TERM GOAL #5    Title Patient will demonstrate fascial restrictions of min amount or less in her RUE in order to increase functional mobility needed to complete reaching tasks.    Time 4    Period Weeks    Status On-going         OT SHORT TERM GOAL #6    Title Patient will increase RUE A/ROM to [facility] in order to be able to reach to shoulder level or above when completing work and daily tasks.    Time 4    Period Weeks    Status On-going      Remaining deficits: Decreased ROM and strength,  increased pain and fascial restrictions.    Education / Equipment: Shoulder HEP   Patient agrees to discharge. Patient goals were not met. Patient is being discharged due to not returning since the last visit.Devann Cribb, OTR/L,CBIS  [phone]  01/26/2021, 3:31 PM  [place] [address] [place], [place], [address] Phone: [phone]   Fax:  [phone]

## 2021-01-31 ENCOUNTER — Other Ambulatory Visit: Payer: Self-pay

## 2021-01-31 MED ORDER — HYDROCODONE-ACETAMINOPHEN 7.5-325 MG PO TABS: 1.0000 | ORAL_TABLET | Freq: Four times a day (QID) | ORAL | 0 refills | Status: DC | PRN

## 2021-02-08 ENCOUNTER — Telehealth: Payer: Self-pay

## 2021-02-08 MED ORDER — HYDROCODONE-ACETAMINOPHEN 7.5-325 MG PO TABS: 1.0000 | ORAL_TABLET | Freq: Four times a day (QID) | ORAL | 0 refills | Status: DC | PRN

## 2021-02-15 ENCOUNTER — Other Ambulatory Visit: Payer: Self-pay

## 2021-02-15 ENCOUNTER — Other Ambulatory Visit: Payer: Self-pay | Admitting: Orthopaedic Surgery

## 2021-02-16 ENCOUNTER — Other Ambulatory Visit: Payer: Self-pay | Admitting: Orthopedic Surgery

## 2021-02-16 DIAGNOSIS — M75121 Complete rotator cuff tear or rupture of right shoulder, not specified as traumatic: Secondary | ICD-10-CM

## 2021-02-16 DIAGNOSIS — G8929 Other chronic pain: Secondary | ICD-10-CM

## 2021-02-16 DIAGNOSIS — M25511 Pain in right shoulder: Secondary | ICD-10-CM

## 2021-02-16 MED ORDER — HYDROCODONE-ACETAMINOPHEN 7.5-325 MG PO TABS: 1.0000 | ORAL_TABLET | Freq: Four times a day (QID) | ORAL | 0 refills | Status: DC | PRN

## 2021-02-16 NOTE — Progress Notes (Signed)
Meds ordered this encounter  Medications  . HYDROcodone-acetaminophen (NORCO) 7.5-325 MG tablet    Sig: Take 1 tablet by mouth every 6 (six) hours as needed for moderate pain.    Dispense:  28 tablet    Refill:  0    

## 2021-02-21 ENCOUNTER — Other Ambulatory Visit: Payer: Self-pay

## 2021-02-21 ENCOUNTER — Ambulatory Visit (INDEPENDENT_AMBULATORY_CARE_PROVIDER_SITE_OTHER): Payer: BC Managed Care – PPO | Admitting: Orthopedic Surgery

## 2021-02-21 ENCOUNTER — Encounter: Payer: Self-pay | Admitting: Orthopedic Surgery

## 2021-02-21 VITALS — BP 168/91 | HR 85 | Ht 68.0 in | Wt 223.1 lb

## 2021-02-21 DIAGNOSIS — M7711 Lateral epicondylitis, right elbow: Secondary | ICD-10-CM

## 2021-02-21 DIAGNOSIS — M75121 Complete rotator cuff tear or rupture of right shoulder, not specified as traumatic: Secondary | ICD-10-CM | POA: Diagnosis not present

## 2021-02-21 MED ORDER — HYDROCODONE-ACETAMINOPHEN 7.5-325 MG PO TABS: 1.0000 | ORAL_TABLET | Freq: Four times a day (QID) | ORAL | 0 refills | Status: DC | PRN

## 2021-02-21 NOTE — Addendum Note (Signed)
Addended byCaffie Damme on: 02/21/2021 02:33 PM   Modules accepted: Orders, SmartSet

## 2021-02-21 NOTE — Patient Instructions (Addendum)
Your surgery will be at [facility] by Dr Romeo Apple  The hospital will contact you with a preoperative appointment to discuss Anesthesia. Please bring your medications with you for the appointment. They will tell you the arrival time and medication instructions when you have your preoperative evaluation. Do not wear nail polish the day of your surgery and if you take Phentermine you need to stop this medication ONE WEEK prior to your surgery.  You will also need to stop Plavix 5 days prior to procedure   Surgery for Rotator Cuff Tear  Rotator cuff surgery is only recommended for individuals who have experienced persistent disability for greater than 3 months of non-surgical (conservative) treatment. Surgery is not necessary but is recommended for individuals who experience difficulty completing daily activities or athletes who are unable to compete. Rotator cuff tears do not usually heal without surgical intervention. If left alone small rotator cuff tears usually become larger. Younger athletes who have a rotator cuff tear may be recommended for surgery without attempting conservative rehabilitation. The purpose of surgery is to regain function of the shoulder joint and eliminate pain associated with the injury. In addition to repairing the tendon tear, the surgery may often remove a portion of the bony roof of the shoulder (acromion) as well as the chronically thickened and inflamed membrane below the acromion (subacromial bursa). REASONS NOT TO OPERATE  Infection of the shoulder. Inability to complete a rehabilitation program. Patients who have other conditions (emotional or psychological) conditions that contribute to their shoulder condition. RISKS AND COMPLICATIONS Infection. Re-tear of the rotator cuff tendons or muscles. Shoulder stiffness and/or weakness. Inability to compete in athletics. Acromioclavicular [facility]) joint pain Risks of surgery: infection, bleeding, nerve damage, or damage to  surrounding tissues. TECHNIQUE There are different surgical procedures used to treat rotator cuff tears. The type of procedure depends on the extent of injury as well as the surgeon's preference. All of the surgical techniques for rotator cuff tears have the same goal of repairing the torn tendon, removing part of the acromion, and removing the subacromial bursa. There are two main types of procedures: arthroscopic and open incision. Arthroscopic procedures are usually completed and you go home the same day as surgery (outpatient). These procedures use multiple small incisions in which tools and a video camera are placed to work on the shoulder. An electric shaver removes the bursa, then a power burr shaves down the portion of the acromion that places pressure on the rotator cuff. Finally the rotator cuff is sewed (sutured) back to the humeral head. Open incision procedures require a larger incision. The deltoid muscle is detached from the acromion and a ligament in the shoulder (coracoacromial) is cut in order for the surgeon to access the rotator cuff. The subacromial bursa is removed as well as part of the acromion to give the rotator cuff room to move freely. The torn tendon is then sutured to the humeral head. After the rotator cuff is repaired, then the deltoid is reattached and the incision is closed up.  RECOVERY  Post-operative care depends on the surgical technique and the preferences of your therapist. Keep the wound clean and dry for the first 10 to 14 days after surgery. Keep your shoulder and arm in the sling provided to you for as long as you have been instructed to. You will be given pain medications by your caregiver. Passive (without using muscles) shoulder movements may be begun when instructed. It is important to follow through with you rehabilitation  program in order to have the best possible recovery. RETURN TO SPORTS  The rehabilitation period will depend on the sport and position  you play as well as the success of the operation. The minimum recovery period is 6 months. You must have regained complete shoulder motion and strength before returning to sports. SEEK IMMEDIATE MEDICAL CARE IF:  Any medications produce adverse side effects. Any complications from surgery occur: Pain, numbness, or coldness in the extremity operated upon. Discoloration of the nail beds (they become blue or gray) of the extremity operated upon. Signs of infections (fever, pain, inflammation, redness, or persistent bleeding).   You have decided to proceed with rotator cuff repair surgery. You have decided not to continue with nonoperative measures such as but not limited to oral medication,   activity modification, physical therapy, bracing, or injection.  We will perform rotator cuff repair. Some of the risks associated with rotator cuff repair include but are not limited to Bleeding Infection Swelling Stiffness Blood clot Pain Re-tearing of the rotator cuff Failure of the rotator cuff to heal   If you're not comfortable with these risks and would like to continue with nonoperative treatment please let Dr. Romeo Apple know prior to your surgery.   Smoking Tobacco Information, Adult Smoking tobacco can be harmful to your health. Tobacco contains a poisonous (toxic), colorless chemical called nicotine. Nicotine is addictive. It changes the brain and can make it hard to stop smoking. Tobacco also has other toxicchemicals that can hurt your body and raise your risk of many cancers. How can smoking tobacco affect me? Smoking tobacco puts you at risk for: Cancer. Smoking is most commonly associated with lung cancer, but can also lead to cancer in other parts of the body. Chronic obstructive pulmonary disease (COPD). This is a long-term lung condition that makes it hard to breathe. It also gets worse over time. High blood pressure (hypertension), heart disease, stroke, or heart attack. Lung  infections, such as pneumonia. Cataracts. This is when the lenses in the eyes become clouded. Digestive problems. This may include peptic ulcers, heartburn, and gastroesophageal reflux disease (GERD). Oral health problems, such as gum disease and tooth loss. Loss of taste and smell. Smoking can affect your appearance by causing: Wrinkles. Yellow or stained teeth, fingers, and fingernails. Smoking tobacco can also affect your social life, because: It may be challenging to find places to smoke when away from home. Many workplaces, [employer], hotels, and public places are tobacco-free. Smoking is expensive. This is due to the cost of tobacco and the long-term costs of treating health problems from smoking. Secondhand smoke may affect those around you. Secondhand smoke can cause lung cancer, breathing problems, and heart disease. Children of smokers have a higher risk for: Sudden infant death syndrome (SIDS). Ear infections. Lung infections. If you currently smoke tobacco, quitting now can help you: Lead a longer and healthier life. Look, smell, breathe, and feel better over time. Save money. Protect others from the harms of secondhand smoke. What actions can I take to prevent health problems? Quit smoking  Do not start smoking. Quit if you already do. Make a plan to quit smoking and commit to it. Look for programs to help you and ask your health care provider for recommendations and ideas. Set a date and write down all the reasons you want to quit. Let your friends and family know you are quitting so they can help and support you. Consider finding friends who also want to quit. It can be  easier to quit with someone else, so that you can support each other. Talk with your health care provider about using nicotine replacement medicines to help you quit, such as gum, lozenges, patches, sprays, or pills. Do not replace cigarette smoking with electronic cigarettes, which are commonly called  e-cigarettes. The safety of e-cigarettes is not known, and some may contain harmful chemicals. If you try to quit but return to smoking, stay positive. It is common to slip up when you first quit, so take it one day at a time. Be prepared for cravings. When you feel the urge to smoke, chew gum or suck on hard candy.  Lifestyle Stay busy and take care of your body. Drink enough fluid to keep your urine pale yellow. Get plenty of exercise and eat a healthy diet. This can help prevent weight gain after quitting. Monitor your eating habits. Quitting smoking can cause you to have a larger appetite than when you smoke. Find ways to relax. Go out with friends or family to a movie or a restaurant where people do not smoke. Ask your health care provider about having regular tests (screenings) to check for cancer. This may include blood tests, imaging tests, and other tests. Find ways to manage your stress, such as meditation, yoga, or exercise. Where to find support To get support to quit smoking, consider: Asking your health care provider for more information and resources. Taking classes to learn more about quitting smoking. Looking for local organizations that offer resources about quitting smoking. Joining a support group for people who want to quit smoking in your local community. Calling the smokefree.gov counselor helpline: 1-800-Quit-Now (217)781-9411) Where to find more information You may find more information about quitting smoking from: HelpGuide.org: www.helpguide.org [employer]: smokefree.gov American Lung Association: www.lung.org Contact a health care provider if you: Have problems breathing. Notice that your lips, nose, or fingers turn blue. Have chest pain. Are coughing up blood. Feel faint or you pass out. Have other health changes that cause you to worry. Summary Smoking tobacco can negatively affect your health, the health of those around you, your finances, and your  social life. Do not start smoking. Quit if you already do. If you need help quitting, ask your health care provider. Think about joining a support group for people who want to quit smoking in your local community. There are many effective programs that will help you to quit this behavior. This information is not intended to replace advice given to you by your health care provider. Make sure you discuss any questions you have with your healthcare provider. Document Revised: 06/01/2020 Document Reviewed: 06/01/2020 Elsevier Patient Education  2022 [employer].

## 2021-02-21 NOTE — Progress Notes (Signed)
Chief Complaint  Patient presents with   Shoulder Pain    R/hurts like crazy. I have good days and bad ones too.    IMPRESSION: 1. Prior rotator cuff repair. Moderate tendinosis of the supraspinatus tendon with a full-thickness tear of the anterior supraspinatus tendon measuring 15 mm in anterior-posterior dimension with a few intact posterior fibers. 2. Mild tendinosis of the infraspinatus tendon. 3. 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 4. Mild osteoarthritis of the glenohumeral joint.     Electronically Signed   By: Elige Ko   On: 09/07/2020 16:09       Tina Pham comes in as a 45 year old female status post 2 rotator cuff repairs with recurrent tear of the rotator cuff pain over the [facility] joint with severe marrow edema still complaining of pain she has been on hydrocodone because she is on a blood thinner and cannot take anti-inflammatories  She also complains of right tennis elbow today with lateral elbow pain and swelling pain with lifting small objects  Reexamination shows tenderness over the right elbow pain with resisted wrist extension neurovascular exam is otherwise intact  Her forward elevation is 120 degrees active 150 degrees passive with weakness in abduction and flexion grade 4+ to 5- out of 5  She is still wanting to proceed with arthroscopic surgery of the right shoulder evaluate rotator cuff for possible repair valuate AC joint for possible resection  She is wanting to do this in September which is fine we reviewed the risks and benefits of surgery versus nonoperative treatment  We refilled her medication and scheduled her for surgery  Meds ordered this encounter  Medications   HYDROcodone-acetaminophen (NORCO) 7.5-325 MG tablet    Sig: Take 1 tablet by mouth every 6 (six) hours as needed for moderate pain.    Dispense:  28 tablet    Refill:  0

## 2021-02-28 ENCOUNTER — Telehealth: Payer: Self-pay | Admitting: Orthopedic Surgery

## 2021-02-28 NOTE — Telephone Encounter (Signed)
Patient called and has pre op and surgery questions.   If you can please give her a call when you can.   Her pre op is 03/24/21

## 2021-02-28 NOTE — Telephone Encounter (Signed)
Have left message for her to call me back  

## 2021-03-01 ENCOUNTER — Other Ambulatory Visit: Payer: Self-pay

## 2021-03-01 DIAGNOSIS — M75121 Complete rotator cuff tear or rupture of right shoulder, not specified as traumatic: Secondary | ICD-10-CM

## 2021-03-01 MED ORDER — HYDROCODONE-ACETAMINOPHEN 7.5-325 MG PO TABS: 1.0000 | ORAL_TABLET | Freq: Four times a day (QID) | ORAL | 0 refills | Status: DC | PRN

## 2021-03-08 ENCOUNTER — Telehealth: Payer: Self-pay | Admitting: Orthopedic Surgery

## 2021-03-08 ENCOUNTER — Other Ambulatory Visit: Payer: Self-pay | Admitting: Orthopedic Surgery

## 2021-03-08 DIAGNOSIS — M75121 Complete rotator cuff tear or rupture of right shoulder, not specified as traumatic: Secondary | ICD-10-CM

## 2021-03-08 MED ORDER — HYDROCODONE-ACETAMINOPHEN 7.5-325 MG PO TABS: 1.0000 | ORAL_TABLET | Freq: Four times a day (QID) | ORAL | 0 refills | Status: DC | PRN

## 2021-03-08 NOTE — Telephone Encounter (Signed)
We spoke she asked if okay to get her covid booster. Told her it will not interfere with anything related for surgery, she voiced understanding.

## 2021-03-08 NOTE — Telephone Encounter (Signed)
Patient states she is having trouble using Mychart to request medication. She requests prescription for Hydrocodone/Acetaminophen 7.5-325 mgs.  Qty  28  Sig: Take 1 tablet by mouth every 6 (six) hours as needed for moderate pain.  Patient uses [facility] on [address]

## 2021-03-08 NOTE — Telephone Encounter (Signed)
Pt asks for you to call her at work  #[phone].  She says she has some questions regarding her surgery

## 2021-03-11 ENCOUNTER — Encounter (HOSPITAL_COMMUNITY): Payer: Self-pay | Admitting: Anesthesiology

## 2021-03-15 ENCOUNTER — Other Ambulatory Visit: Payer: Self-pay

## 2021-03-15 DIAGNOSIS — M75121 Complete rotator cuff tear or rupture of right shoulder, not specified as traumatic: Secondary | ICD-10-CM

## 2021-03-16 MED ORDER — HYDROCODONE-ACETAMINOPHEN 7.5-325 MG PO TABS: 1.0000 | ORAL_TABLET | Freq: Four times a day (QID) | ORAL | 0 refills | Status: DC | PRN

## 2021-03-18 ENCOUNTER — Other Ambulatory Visit: Payer: Self-pay | Admitting: Orthopedic Surgery

## 2021-03-18 DIAGNOSIS — M542 Cervicalgia: Secondary | ICD-10-CM

## 2021-03-18 NOTE — Patient Instructions (Addendum)
Tina Pham  03/18/2021     @PREFPERIOPPHARMACY @   Your procedure is scheduled on  03/29/2021.   Report to 05/29/2021 at  0815  A.M.   Call this number if you have problems the morning of surgery:  [phone]   Remember:  Do not eat or drink after midnight.  Your last dose of plavix should be 03/23/2021.  DO NOT take any medications for diabetes the morning of your procedure.      Take these medicines the morning of surgery with A SIP OF WATER                 gabapentin, hydrocodone (If needed)     Do not wear jewelry, make-up or nail polish.  Do not wear lotions, powders, or perfumes, or deodorant.  Do not shave 48 hours prior to surgery.  Men may shave face and neck.  Do not bring valuables to the hospital.  [facility] is not responsible for any belongings or valuables.  Contacts, dentures or bridgework may not be worn into surgery.  Leave your suitcase in the car.  After surgery it may be brought to your room.  For patients admitted to the hospital, discharge time will be determined by your treatment team.  Patients discharged the day of surgery will not be allowed to drive home and must have someone with them for 24 hours.    Special instructions:   DO NOT smoke tobacco or vape for 24 hours before your procedure.  Please read over the following fact sheets that you were given. Coughing and Deep Breathing, Surgical Site Infection Prevention, Anesthesia Post-op Instructions, and Care and Recovery After Surgery      Surgery for Shoulder Impingement Syndrome, Care After This sheet gives you information about how to care for yourself after your procedure. Your health care provider may also give you more specific instructions. If you have problems or questions, contact your health careprovider. What can I expect after the procedure? After the procedure, it is common to have: Pain and stiffness in your shoulder area. This may include your arm,  chest, and back. Bruising. Swelling. Follow these instructions at home: If you have a sling: Wear the sling as told by your health care provider. Remove it only as told by your health care provider. Loosen it if your fingers tingle, become numb, or turn cold and blue. Keep it clean. Ask if you may remove the sling for bathing. If you need to keep it on while bathing and it is not waterproof: Do not let it get wet. Cover it with a watertight covering when you take a bath or a shower. Bathing Do not take baths, swim, or use a hot tub until your health care provider approves. Ask your health care provider if you may take showers. You may only be allowed to take sponge baths. Keep your bandages (dressings) dry until your health care provider says they can be removed. Incision care  Follow instructions from your health care provider about how to take care of your incisions. Make sure you: Wash your hands with soap and water before and after you change your dressings. If soap and water are not available, use hand sanitizer. Change your dressings as told by your health care provider. Leave stitches (sutures), skin glue, or adhesive strips in place. These skin closures may need to be in place for 2 weeks or longer. If adhesive strip edges start to  loosen and curl up, you may trim the loose edges. Do not remove adhesive strips completely unless your health care provider tells you to do that. Check your incision areas every day for signs of infection. Check for: More redness, swelling, or pain. Fluid or blood. Warmth. Pus or a bad smell.  Driving Ask your health care provider if the medicine prescribed to you requires you to avoid driving or using heavy machinery. Do not drive for 24 hours if you were given a sedative during your procedure. Ask your health care provider when it is safe to drive if you have a sling on your arm. Managing pain, stiffness, and swelling  Keep your arm and shoulder  in the position recommended by your health care provider. If directed, put ice on your shoulder. If you have a sling, remove it as told by your health care provider. Put ice in a plastic bag. Place a towel between your skin and the bag. Leave the ice on for 20 minutes, 2-3 times a day. Move your fingers often to reduce stiffness and swelling.  Activity Return to your normal activities as told by your health care provider. Ask your health care provider what activities are safe for you. Do not lift anything that is heavier than 10 lb (4.5 kg), or the limit that you are told, until your health care provider says that it is safe. Do exercises and stretches as told by your health care provider. General instructions Take over-the-counter and prescription medicines only as told by your health care provider. Ask your health care provider if the medicine prescribed to you can cause constipation. You may need to take actions to prevent or treat constipation, such as: Drink enough fluid to keep your urine pale yellow. Take over-the-counter or prescription medicines. Eat foods that are high in fiber, such as beans, whole grains, and fresh fruits and vegetables. Limit foods that are high in fat and processed sugars, such as fried or sweet foods. Do not use any products that contain nicotine or tobacco, such as cigarettes, e-cigarettes, and chewing tobacco. These can delay incision healing after surgery. If you need help quitting, ask your health care provider. Keep all follow-up visits as told by your health care provider. This is important. Contact a health care provider if: You have a fever. You have pain that gets worse or does not get better with medicine. You have more redness, swelling, or pain around an incision. You have fluid or blood coming from an incision. An incision feels warm to the touch. You have pus or a bad smell coming from an incision. You have muscle aches. You are dizzy. Get  help right away if: You have severe pain in your shoulder, arm, or hand. Your hand or arm becomes numb. Your hand or arm feels unusually cold. Your fingernails turn a dark color, such as blue or gray. Summary After the procedure, it is common to have pain and stiffness in your shoulder area. If you have a sling, wear it as told by your health care provider. Remove it only as told by your health care provider. Keep your arm and shoulder in the position recommended by your health care provider. If directed, put ice on your shoulder. Keep all follow-up visits as told by your health care provider. This is important. This information is not intended to replace advice given to you by your health care provider. Make sure you discuss any questions you have with your healthcare provider. Document Revised: 06/27/2018  Document Reviewed: 06/28/2018 Elsevier Patient Education  2022 [employer] Shoulder Arthroscopy, Care After The following information offers guidance on how to care for yourself after your procedure. Your health care provider may also give you more specific instructions. If you have problems or questions, contact your health careprovider. What can I expect after the procedure? After the procedure, it is common to have: Pain. Swelling. A small amount of fluid from the incision. Stiffness that improves over time. Follow these instructions at home: If you have a sling or an immobilizer: Wear it as told by your health care provider. Remove it only as told by your health care provider. These devices protect your shoulder and help it heal by keeping it in place. Check the skin around it every day. Tell your health care provider about any concerns. Loosen it if your fingers tingle, become numb, or turn cold and blue. Keep the sling or immobilizer clean. If it is not waterproof: Do not let it get wet. Cover it with a watertight covering when you take a bath or a shower. Incision  care  Follow instructions from your health care provider about how to take care of your incisions. Make sure you: Wash your hands with soap and water for at least 20 seconds before and after you change your bandage (dressing). If soap and water are not available, use hand sanitizer. Change your dressing as told by your health care provider. Leave stitches (sutures), skin glue, or adhesive strips in place. These skin closures may need to stay in place for 2 weeks or longer. If adhesive strip edges start to loosen and curl up, you may trim the loose edges. Do not remove adhesive strips completely unless your health care provider tells you to do that. Check your incision areas every day for signs of infection. Check for: Redness. More swelling or pain. Blood or more fluid. Warmth. Pus or a bad smell. Do not take baths, swim, or use a hot tub until your health care provider approves. Ask your health care provider if you may take showers. You may only be allowed to take sponge baths.  Managing pain, stiffness, and swelling  If directed, put ice on the affected area. To do this: If you have a removable sling or immobilizer, remove it as told by your health care provider. Put ice in a plastic bag or use the icing device (cold therapy unit) that you were given. Follow instructions from your health care provider about how to use the icing device. Place a towel between your skin and the bag or between your skin and the icing device. Leave the ice on for 20 minutes, 2-3 times a day. Remove the ice if your skin turns bright red. This is very important. If you cannot feel pain, heat, or cold, you have a greater risk of damage to the area. Move your fingers often to reduce stiffness and swelling. Raise (elevate) the injured area above the level of your heart while you are lying down. It may help to sleep in a sitting position for a few days after your procedure. Try sleeping in a reclining chair or  propping yourself up with extra pillows in bed.  Activity Ask your health care provider what activities are safe for you during recovery. Do not lift with your affected shoulder until your health care provider approves. Avoid pulling and pushing with the arm on your affected side. If physical therapy was prescribed, do exercises as directed. Doing exercises may help to  improve shoulder movement and flexibility (range of motion). Driving Ask your health care provider when it is safe to drive if you have a sling or immobilizer. Ask your health care provider if the medicine prescribed to you requires you to avoid driving or using machinery. General instructions Take over-the-counter and prescription medicines only as told by your health care provider. Ask your health care provider if the medicine prescribed to you can cause constipation. You may need to take these actions to prevent or treat constipation: Drink enough fluid to keep your urine pale yellow. Take over-the-counter or prescription medicines. Eat foods that are high in fiber, such as beans, whole grains, and fresh fruits and vegetables. Limit foods that are high in fat and processed sugars, such as fried or sweet foods. Do not use any products that contain nicotine or tobacco. These products include cigarettes, chewing tobacco, and vaping devices, such as e-cigarettes. These can delay incision healing after surgery. If you need help quitting, ask your health care provider. Keep all follow-up visits. This is important. Contact a health care provider if: You have a fever or chills. You have severe pain. You have any of these signs of infection: Redness around an incision. More swelling or pain in an incision area. Blood or more fluid coming from an incision. Warmth coming from an incision. Pus or a bad smell coming from an incision. You notice that an incision has opened up. You develop a rash. Get help right away if: You have  difficulty breathing. You have chest pain. You notice that your fingers tingle, are numb, or are cold and blue even after you loosen your sling or immobilizer. You develop pain in your lower leg or at the back of your knee. These symptoms may represent a serious problem that is an emergency. Do not wait to see if the symptoms will go away. Get medical help right away. Call your local emergency services (911 in the [place] Do not drive yourself to the hospital. Summary If you have a sling or an immobilizer, wear it as told by your health care provider. It may help to sleep in a sitting position for a few days after your procedure. If physical therapy was prescribed, do exercises as directed. Doing exercises may help to improve shoulder movement and flexibility (range of motion). Keep all follow-up visits. This is important. This information is not intended to replace advice given to you by your health care provider. Make sure you discuss any questions you have with your healthcare provider. Document Revised: 03/08/2020 Document Reviewed: 03/08/2020 Elsevier Patient Education  2022 [employer] General Anesthesia, Adult, Care After This sheet gives you information about how to care for yourself after your procedure. Your health care provider may also give you more specific instructions. If you have problems or questions, contact your health careprovider. What can I expect after the procedure? After the procedure, the following side effects are common: Pain or discomfort at the IV site. Nausea. Vomiting. Sore throat. Trouble concentrating. Feeling cold or chills. Feeling weak or tired. Sleepiness and fatigue. Soreness and body aches. These side effects can affect parts of the body that were not involved in surgery. Follow these instructions at home: For the time period you were told by your health care provider:  Rest. Do not participate in activities where you could fall or become  injured. Do not drive or use machinery. Do not drink alcohol. Do not take sleeping pills or medicines that cause drowsiness. Do not make  important decisions or sign legal documents. Do not take care of children on your own.  Eating and drinking Follow any instructions from your health care provider about eating or drinking restrictions. When you feel hungry, start by eating small amounts of foods that are soft and easy to digest (bland), such as toast. Gradually return to your regular diet. Drink enough fluid to keep your urine pale yellow. If you vomit, rehydrate by drinking water, juice, or clear broth. General instructions If you have sleep apnea, surgery and certain medicines can increase your risk for breathing problems. Follow instructions from your health care provider about wearing your sleep device: Anytime you are sleeping, including during daytime naps. While taking prescription pain medicines, sleeping medicines, or medicines that make you drowsy. Have a responsible adult stay with you for the time you are told. It is important to have someone help care for you until you are awake and alert. Return to your normal activities as told by your health care provider. Ask your health care provider what activities are safe for you. Take over-the-counter and prescription medicines only as told by your health care provider. If you smoke, do not smoke without supervision. Keep all follow-up visits as told by your health care provider. This is important. Contact a health care provider if: You have nausea or vomiting that does not get better with medicine. You cannot eat or drink without vomiting. You have pain that does not get better with medicine. You are unable to pass urine. You develop a skin rash. You have a fever. You have redness around your IV site that gets worse. Get help right away if: You have difficulty breathing. You have chest pain. You have blood in your urine or  stool, or you vomit blood. Summary After the procedure, it is common to have a sore throat or nausea. It is also common to feel tired. Have a responsible adult stay with you for the time you are told. It is important to have someone help care for you until you are awake and alert. When you feel hungry, start by eating small amounts of foods that are soft and easy to digest (bland), such as toast. Gradually return to your regular diet. Drink enough fluid to keep your urine pale yellow. Return to your normal activities as told by your health care provider. Ask your health care provider what activities are safe for you. This information is not intended to replace advice given to you by your health care provider. Make sure you discuss any questions you have with your healthcare provider. Document Revised: 03/25/2020 Document Reviewed: 10/23/2019 Elsevier Patient Education  2022 [employer] How to Use Chlorhexidine for Bathing Chlorhexidine gluconate (CHG) is a germ-killing (antiseptic) solution that is used to clean the skin. It can get rid of the bacteria that normally live on the skin and can keep them away for about 24 hours. To clean your skin with CHG, you may be given: A CHG solution to use in the shower or as part of a sponge bath. A prepackaged cloth that contains CHG. Cleaning your skin with CHG may help lower the risk for infection: While you are staying in the intensive care unit of the hospital. If you have a vascular access, such as a central line, to provide short-term or long-term access to your veins. If you have a catheter to drain urine from your bladder. If you are on a ventilator. A ventilator is a machine that helps you breathe by  moving air in and out of your lungs. After surgery. What are the risks? Risks of using CHG include: A skin reaction. Hearing loss, if CHG gets in your ears. Eye injury, if CHG gets in your eyes and is not rinsed out. The CHG product catching  fire. Make sure that you avoid smoking and flames after applying CHG to your skin. Do not use CHG: If you have a chlorhexidine allergy or have previously reacted to chlorhexidine. On babies younger than 54 months of age. How to use CHG solution Use CHG only as told by your health care provider, and follow the instructions on the label. Use the full amount of CHG as directed. Usually, this is one bottle. During a shower Follow these steps when using CHG solution during a shower (unless your health care provider gives you different instructions): Start the shower. Use your normal soap and shampoo to wash your face and hair. Turn off the shower or move out of the shower stream. Pour the CHG onto a clean washcloth. Do not use any type of brush or rough-edged sponge. Starting at your neck, lather your body down to your toes. Make sure you follow these instructions: If you will be having surgery, pay special attention to the part of your body where you will be having surgery. Scrub this area for at least 1 minute. Do not use CHG on your head or face. If the solution gets into your ears or eyes, rinse them well with water. Avoid your genital area. Avoid any areas of skin that have broken skin, cuts, or scrapes. Scrub your back and under your arms. Make sure to wash skin folds. Let the lather sit on your skin for 1-2 minutes or as long as told by your health care provider. Thoroughly rinse your entire body in the shower. Make sure that all body creases and crevices are rinsed well. Dry off with a clean towel. Do not put any substances on your body afterward--such as powder, lotion, or perfume--unless you are told to do so by your health care provider. Only use lotions that are recommended by the manufacturer. Put on clean clothes or pajamas. If it is the night before your surgery, sleep in clean sheets.  During a sponge bath Follow these steps when using CHG solution during a sponge bath (unless  your health care provider gives you different instructions): Use your normal soap and shampoo to wash your face and hair. Pour the CHG onto a clean washcloth. Starting at your neck, lather your body down to your toes. Make sure you follow these instructions: If you will be having surgery, pay special attention to the part of your body where you will be having surgery. Scrub this area for at least 1 minute. Do not use CHG on your head or face. If the solution gets into your ears or eyes, rinse them well with water. Avoid your genital area. Avoid any areas of skin that have broken skin, cuts, or scrapes. Scrub your back and under your arms. Make sure to wash skin folds. Let the lather sit on your skin for 1-2 minutes or as long as told by your health care provider. Using a different clean, wet washcloth, thoroughly rinse your entire body. Make sure that all body creases and crevices are rinsed well. Dry off with a clean towel. Do not put any substances on your body afterward--such as powder, lotion, or perfume--unless you are told to do so by your health care provider. Only use  lotions that are recommended by the manufacturer. Put on clean clothes or pajamas. If it is the night before your surgery, sleep in clean sheets. How to use CHG prepackaged cloths Only use CHG cloths as told by your health care provider, and follow the instructions on the label. Use the CHG cloth on clean, dry skin. Do not use the CHG cloth on your head or face unless your health care provider tells you to. When washing with the CHG cloth: Avoid your genital area. Avoid any areas of skin that have broken skin, cuts, or scrapes. Before surgery Follow these steps when using a CHG cloth to clean before surgery (unless your health care provider gives you different instructions): Using the CHG cloth, vigorously scrub the part of your body where you will be having surgery. Scrub using a back-and-forth motion for 3 minutes. The  area on your body should be completely wet with CHG when you are done scrubbing. Do not rinse. Discard the cloth and let the area air-dry. Do not put any substances on the area afterward, such as powder, lotion, or perfume. Put on clean clothes or pajamas. If it is the night before your surgery, sleep in clean sheets.  For general bathing Follow these steps when using CHG cloths for general bathing (unless your health care provider gives you different instructions). Use a separate CHG cloth for each area of your body. Make sure you wash between any folds of skin and between your fingers and toes. Wash your body in the following order, switching to a new cloth after each step: The front of your neck, shoulders, and chest. Both of your arms, under your arms, and your hands. Your stomach and groin area, avoiding the genitals. Your right leg and foot. Your left leg and foot. The back of your neck, your back, and your buttocks. Do not rinse. Discard the cloth and let the area air-dry. Do not put any substances on your body afterward--such as powder, lotion, or perfume--unless you are told to do so by your health care provider. Only use lotions that are recommended by the manufacturer. Put on clean clothes or pajamas. Contact a health care provider if: Your skin gets irritated after scrubbing. You have questions about using your solution or cloth. Get help right away if: Your eyes become very red or swollen. Your eyes itch badly. Your skin itches badly and is red or swollen. Your hearing changes. You have trouble seeing. You have swelling or tingling in your mouth or throat. You have trouble breathing. You swallow any chlorhexidine. Summary Chlorhexidine gluconate (CHG) is a germ-killing (antiseptic) solution that is used to clean the skin. Cleaning your skin with CHG may help to lower your risk for infection. You may be given CHG to use for bathing. It may be in a bottle or in a prepackaged  cloth to use on your skin. Carefully follow your health care provider's instructions and the instructions on the product label. Do not use CHG if you have a chlorhexidine allergy. Contact your health care provider if your skin gets irritated after scrubbing. This information is not intended to replace advice given to you by your health care provider. Make sure you discuss any questions you have with your healthcare provider. Document Revised: 11/21/2019 Document Reviewed: 12/26/2019 Elsevier Patient Education  2022 [employer].

## 2021-03-21 ENCOUNTER — Other Ambulatory Visit: Payer: Self-pay | Admitting: Orthopedic Surgery

## 2021-03-21 DIAGNOSIS — M75121 Complete rotator cuff tear or rupture of right shoulder, not specified as traumatic: Secondary | ICD-10-CM

## 2021-03-21 MED ORDER — HYDROCODONE-ACETAMINOPHEN 7.5-325 MG PO TABS: 1.0000 | ORAL_TABLET | Freq: Four times a day (QID) | ORAL | 0 refills | Status: DC | PRN

## 2021-03-23 ENCOUNTER — Other Ambulatory Visit: Payer: Self-pay

## 2021-03-23 DIAGNOSIS — M75121 Complete rotator cuff tear or rupture of right shoulder, not specified as traumatic: Secondary | ICD-10-CM

## 2021-03-24 ENCOUNTER — Encounter (HOSPITAL_COMMUNITY)
Admission: RE | Admit: 2021-03-24 | Discharge: 2021-03-24 | Disposition: A | Discharge status: Home / Self Care | Payer: BC Managed Care – PPO | Source: Ambulatory Visit | Attending: Orthopedic Surgery | Admitting: Orthopedic Surgery

## 2021-03-24 ENCOUNTER — Encounter (HOSPITAL_COMMUNITY): Payer: Self-pay

## 2021-03-24 ENCOUNTER — Other Ambulatory Visit: Payer: Self-pay

## 2021-03-24 DIAGNOSIS — E119 Type 2 diabetes mellitus without complications: Secondary | ICD-10-CM | POA: Insufficient documentation

## 2021-03-24 DIAGNOSIS — Z01812 Encounter for preprocedural laboratory examination: Secondary | ICD-10-CM | POA: Insufficient documentation

## 2021-03-24 LAB — BASIC METABOLIC PANEL
Anion gap: 9 (ref 5–15)
BUN: 16 mg/dL (ref 6–20)
CO2: 25 mmol/L (ref 22–32)
Calcium: 8.9 mg/dL (ref 8.9–10.3)
Chloride: 102 mmol/L (ref 98–111)
Creatinine, Ser: 0.64 mg/dL (ref 0.44–1.00)
GFR, Estimated: >60 mL/min (ref >60)
Glucose, Bld: 217 mg/dL — ABNORMAL HIGH (ref 70–99)
Potassium: 3.4 mmol/L — ABNORMAL LOW (ref 3.5–5.1)
Sodium: 136 mmol/L (ref 135–145)

## 2021-03-24 LAB — CBC WITH DIFFERENTIAL/PLATELET
Abs Immature Granulocytes: 0.03 10*3/uL (ref 0.00–0.07)
Basophils Absolute: 0 10*3/uL (ref 0.0–0.1)
Basophils Relative: 1 %
Eosinophils Absolute: 0.1 10*3/uL (ref 0.0–0.5)
Eosinophils Relative: 2 %
HCT: 27.7 % — ABNORMAL LOW (ref 36.0–46.0)
Hemoglobin: 7.7 g/dL — ABNORMAL LOW (ref 12.0–15.0)
Immature Granulocytes: 1 %
Lymphocytes Relative: 17 %
Lymphs Abs: 1 10*3/uL (ref 0.7–4.0)
MCH: 20.4 pg — ABNORMAL LOW (ref 26.0–34.0)
MCHC: 27.8 g/dL — ABNORMAL LOW (ref 30.0–36.0)
MCV: 73.3 fL — ABNORMAL LOW (ref 80.0–100.0)
Monocytes Absolute: 0.4 10*3/uL (ref 0.1–1.0)
Monocytes Relative: 7 %
Neutro Abs: 4.3 10*3/uL (ref 1.7–7.7)
Neutrophils Relative %: 72 %
Platelets: 273 10*3/uL (ref 150–400)
RBC: 3.78 MIL/uL — ABNORMAL LOW (ref 3.87–5.11)
RDW: 15.9 % — ABNORMAL HIGH (ref 11.5–15.5)
WBC: 5.9 10*3/uL (ref 4.0–10.5)
nRBC: 0 % (ref 0.0–0.2)

## 2021-03-24 LAB — HCG, SERUM, QUALITATIVE: Preg, Serum: NEGATIVE

## 2021-03-24 LAB — HEMOGLOBIN A1C
Hgb A1c MFr Bld: 7.8 % — ABNORMAL HIGH (ref 4.8–5.6)
Mean Plasma Glucose: 177.16 mg/dL

## 2021-03-29 ENCOUNTER — Telehealth: Payer: Self-pay | Admitting: Orthopedic Surgery

## 2021-03-29 ENCOUNTER — Ambulatory Visit (HOSPITAL_COMMUNITY)
Admission: RE | Admit: 2021-03-29 | Payer: BC Managed Care – PPO | Source: Home / Self Care | Admitting: Orthopedic Surgery

## 2021-03-29 ENCOUNTER — Other Ambulatory Visit: Payer: Self-pay

## 2021-03-29 ENCOUNTER — Encounter (HOSPITAL_COMMUNITY): Admission: RE | Payer: Self-pay | Source: Home / Self Care

## 2021-03-29 DIAGNOSIS — M75121 Complete rotator cuff tear or rupture of right shoulder, not specified as traumatic: Secondary | ICD-10-CM

## 2021-03-29 SURGERY — SHOULDER ARTHROSCOPY WITH DISTAL CLAVICLE RESECTION
Anesthesia: General | Site: Shoulder | Laterality: Right

## 2021-03-29 MED ORDER — HYDROCODONE-ACETAMINOPHEN 7.5-325 MG PO TABS: 1.0000 | ORAL_TABLET | Freq: Four times a day (QID) | ORAL | 0 refills | Status: DC | PRN

## 2021-03-29 NOTE — Telephone Encounter (Signed)
Patient called to inquire - due to her surgery being cancelled - asking whether Dr Romeo Apple would order her labs rather than her waiting until her primary care appointment on 04/11/21  Relayed it may be necessary for primary care to take care of labs, as well as to evaluate at that time - please advise patient.

## 2021-03-30 NOTE — Telephone Encounter (Signed)
Thanks, that's what Okey Regal told her when she called. Just was [employer]

## 2021-04-04 ENCOUNTER — Other Ambulatory Visit: Payer: Self-pay

## 2021-04-04 DIAGNOSIS — M75121 Complete rotator cuff tear or rupture of right shoulder, not specified as traumatic: Secondary | ICD-10-CM

## 2021-04-05 ENCOUNTER — Other Ambulatory Visit: Payer: Self-pay

## 2021-04-05 DIAGNOSIS — M75121 Complete rotator cuff tear or rupture of right shoulder, not specified as traumatic: Secondary | ICD-10-CM

## 2021-04-05 MED ORDER — HYDROCODONE-ACETAMINOPHEN 7.5-325 MG PO TABS: 1.0000 | ORAL_TABLET | Freq: Four times a day (QID) | ORAL | 0 refills | Status: DC | PRN

## 2021-04-11 ENCOUNTER — Other Ambulatory Visit: Payer: Self-pay

## 2021-04-11 ENCOUNTER — Other Ambulatory Visit (HOSPITAL_COMMUNITY): Payer: Self-pay | Admitting: Family Medicine

## 2021-04-11 DIAGNOSIS — M75121 Complete rotator cuff tear or rupture of right shoulder, not specified as traumatic: Secondary | ICD-10-CM

## 2021-04-11 DIAGNOSIS — Z1231 Encounter for screening mammogram for malignant neoplasm of breast: Secondary | ICD-10-CM

## 2021-04-12 ENCOUNTER — Other Ambulatory Visit: Payer: Self-pay

## 2021-04-12 ENCOUNTER — Telehealth: Payer: Self-pay | Admitting: Orthopedic Surgery

## 2021-04-12 DIAGNOSIS — M75121 Complete rotator cuff tear or rupture of right shoulder, not specified as traumatic: Secondary | ICD-10-CM

## 2021-04-12 NOTE — Telephone Encounter (Signed)
Patient called to relay that her primary care provider at [facility], ph [phone], has done lab work yesterday, 04/11/21, which she states indicates that she is anemic. States other labs are in progress. She has asked again about when she may return to see Dr Romeo Apple to discuss re-scheduling of surgery. I relayed that she will need to first await the other pending lab results, and any treatment plan per her primary care provider, and that there would likely need to be medical clearance for surgery.  Please advise.

## 2021-04-13 ENCOUNTER — Encounter: Payer: Self-pay | Admitting: Radiology

## 2021-04-13 MED ORDER — HYDROCODONE-ACETAMINOPHEN 7.5-325 MG PO TABS: 1.0000 | ORAL_TABLET | Freq: Four times a day (QID) | ORAL | 0 refills | Status: DC | PRN

## 2021-04-13 NOTE — Telephone Encounter (Signed)
Called back to patient; left voice mail message to return call, per clinic staff note.

## 2021-04-13 NOTE — Telephone Encounter (Signed)
Done; Patient aware of scheduled appointment. °

## 2021-04-18 ENCOUNTER — Other Ambulatory Visit: Payer: Self-pay

## 2021-04-18 DIAGNOSIS — M75121 Complete rotator cuff tear or rupture of right shoulder, not specified as traumatic: Secondary | ICD-10-CM

## 2021-04-19 ENCOUNTER — Other Ambulatory Visit: Payer: Self-pay

## 2021-04-19 DIAGNOSIS — M75121 Complete rotator cuff tear or rupture of right shoulder, not specified as traumatic: Secondary | ICD-10-CM

## 2021-04-19 MED ORDER — HYDROCODONE-ACETAMINOPHEN 7.5-325 MG PO TABS: 1.0000 | ORAL_TABLET | Freq: Four times a day (QID) | ORAL | 0 refills | Status: DC | PRN

## 2021-04-20 ENCOUNTER — Ambulatory Visit (HOSPITAL_COMMUNITY)
Admission: RE | Admit: 2021-04-20 | Discharge: 2021-04-20 | Disposition: A | Discharge status: Home / Self Care | Payer: BC Managed Care – PPO | Source: Ambulatory Visit | Attending: Family Medicine | Admitting: Family Medicine

## 2021-04-20 ENCOUNTER — Other Ambulatory Visit: Payer: Self-pay

## 2021-04-20 DIAGNOSIS — Z1231 Encounter for screening mammogram for malignant neoplasm of breast: Secondary | ICD-10-CM | POA: Insufficient documentation

## 2021-04-25 ENCOUNTER — Other Ambulatory Visit: Payer: Self-pay

## 2021-04-25 DIAGNOSIS — M75121 Complete rotator cuff tear or rupture of right shoulder, not specified as traumatic: Secondary | ICD-10-CM

## 2021-04-25 MED ORDER — HYDROCODONE-ACETAMINOPHEN 7.5-325 MG PO TABS: 1.0000 | ORAL_TABLET | Freq: Four times a day (QID) | ORAL | 0 refills | Status: AC | PRN

## 2021-04-28 ENCOUNTER — Ambulatory Visit (INDEPENDENT_AMBULATORY_CARE_PROVIDER_SITE_OTHER): Payer: BC Managed Care – PPO | Admitting: Orthopedic Surgery

## 2021-04-28 ENCOUNTER — Encounter: Payer: Self-pay | Admitting: Orthopedic Surgery

## 2021-04-28 ENCOUNTER — Other Ambulatory Visit: Payer: Self-pay

## 2021-04-28 VITALS — BP 169/70 | HR 87 | Ht 68.0 in | Wt 220.0 lb

## 2021-04-28 DIAGNOSIS — M75121 Complete rotator cuff tear or rupture of right shoulder, not specified as traumatic: Secondary | ICD-10-CM

## 2021-04-28 NOTE — Progress Notes (Signed)
Chief Complaint  Patient presents with   Shoulder Pain    Right/ wants to RS surgery her HgB level is now 8.5 and she is supposed to have iron infusions soon    Right shoulder pain getting worse. 45 yo female awaiting surgery. She was scheduled for rotator cuff repair but her Hg was too low (7 ish)   Primary care ordered iron infusions but somehow the orders were not received and the patient was able to go  She comes in today with worsening shoulder pain we have had to keep her on Norco for over 6 months now which will eventually lead to postop pain management issues  She says her shoulder is getting worse she is now having pain radiating further down the arm  We called short stay and confirmed that she does have infusions set up and she was able to get the appointment and she will get those soon  She is also on Plavix so when we schedule surgery she will have to be off of that for 48 hours

## 2021-05-03 ENCOUNTER — Encounter (HOSPITAL_COMMUNITY)
Admission: RE | Admit: 2021-05-03 | Discharge: 2021-05-03 | Disposition: A | Discharge status: Home / Self Care | Payer: BC Managed Care – PPO | Source: Ambulatory Visit | Attending: Family Medicine | Admitting: Family Medicine

## 2021-05-03 ENCOUNTER — Other Ambulatory Visit: Payer: Self-pay | Admitting: Radiology

## 2021-05-03 ENCOUNTER — Encounter: Payer: Self-pay | Admitting: Orthopedic Surgery

## 2021-05-03 ENCOUNTER — Encounter (HOSPITAL_COMMUNITY): Payer: Self-pay

## 2021-05-03 DIAGNOSIS — D509 Iron deficiency anemia, unspecified: Secondary | ICD-10-CM | POA: Insufficient documentation

## 2021-05-03 MED ORDER — SODIUM CHLORIDE 0.9 % IV SOLN
510.0000 mg | Freq: Once | INTRAVENOUS | Status: AC
  Administered 2021-05-03: 510 mg via INTRAVENOUS
  Filled 2021-05-03: qty 17

## 2021-05-03 MED ORDER — SODIUM CHLORIDE 0.9 % IV SOLN
INTRAVENOUS | Status: DC
  Administered 2021-05-03: 250 mL via INTRAVENOUS

## 2021-05-04 ENCOUNTER — Other Ambulatory Visit: Payer: Self-pay | Admitting: Radiology

## 2021-05-04 ENCOUNTER — Encounter: Payer: Self-pay | Admitting: Orthopedic Surgery

## 2021-05-04 NOTE — Telephone Encounter (Signed)
-----   Message from Doristine Section sent at 05/04/2021 10:09 AM EDT ----- Regarding: Note started 05/03/21 Tina Pham, Tina Pham [[id number]] said a refill request was to be entered-please see 05/03/21 - please advise.

## 2021-05-05 MED ORDER — HYDROCODONE-ACETAMINOPHEN 7.5-325 MG PO TABS: 1.0000 | ORAL_TABLET | Freq: Four times a day (QID) | ORAL | 0 refills | Status: DC | PRN

## 2021-05-09 ENCOUNTER — Telehealth: Payer: Self-pay | Admitting: Radiology

## 2021-05-09 ENCOUNTER — Other Ambulatory Visit: Payer: Self-pay | Admitting: Orthopedic Surgery

## 2021-05-09 DIAGNOSIS — G8929 Other chronic pain: Secondary | ICD-10-CM

## 2021-05-09 DIAGNOSIS — M75121 Complete rotator cuff tear or rupture of right shoulder, not specified as traumatic: Secondary | ICD-10-CM

## 2021-05-09 DIAGNOSIS — M19011 Primary osteoarthritis, right shoulder: Secondary | ICD-10-CM

## 2021-05-09 NOTE — Telephone Encounter (Signed)
Called no answer

## 2021-05-09 NOTE — Telephone Encounter (Addendum)
She said she has gotten her iron level 466 Hemoglobin 9.3 Another iron infusion scheduled tomorrow  Ok to post case for Oct 28th? Or this Friday Oct 21st? She really wants as soon as possible.

## 2021-05-09 NOTE — Telephone Encounter (Signed)
Patient called, left VM asking for Amy to please call her, she has questions about her labwork.

## 2021-05-10 ENCOUNTER — Other Ambulatory Visit: Payer: Self-pay

## 2021-05-10 ENCOUNTER — Encounter (HOSPITAL_COMMUNITY)
Admission: RE | Admit: 2021-05-10 | Discharge: 2021-05-10 | Disposition: A | Discharge status: Home / Self Care | Payer: BC Managed Care – PPO | Source: Ambulatory Visit | Attending: Family Medicine | Admitting: Family Medicine

## 2021-05-10 ENCOUNTER — Encounter (HOSPITAL_BASED_OUTPATIENT_CLINIC_OR_DEPARTMENT_OTHER)
Admission: RE | Admit: 2021-05-10 | Discharge: 2021-05-10 | Disposition: A | Discharge status: Home / Self Care | Payer: BC Managed Care – PPO | Source: Ambulatory Visit | Attending: Orthopedic Surgery | Admitting: Orthopedic Surgery

## 2021-05-10 ENCOUNTER — Encounter (HOSPITAL_COMMUNITY): Payer: Self-pay

## 2021-05-10 VITALS — Temp 98.7°F

## 2021-05-10 DIAGNOSIS — Z888 Allergy status to other drugs, medicaments and biological substances status: Secondary | ICD-10-CM | POA: Diagnosis not present

## 2021-05-10 DIAGNOSIS — Z8249 Family history of ischemic heart disease and other diseases of the circulatory system: Secondary | ICD-10-CM | POA: Diagnosis not present

## 2021-05-10 DIAGNOSIS — F17210 Nicotine dependence, cigarettes, uncomplicated: Secondary | ICD-10-CM | POA: Diagnosis not present

## 2021-05-10 DIAGNOSIS — M75121 Complete rotator cuff tear or rupture of right shoulder, not specified as traumatic: Secondary | ICD-10-CM | POA: Diagnosis not present

## 2021-05-10 DIAGNOSIS — Z833 Family history of diabetes mellitus: Secondary | ICD-10-CM | POA: Diagnosis not present

## 2021-05-10 DIAGNOSIS — M19011 Primary osteoarthritis, right shoulder: Secondary | ICD-10-CM

## 2021-05-10 DIAGNOSIS — Z7982 Long term (current) use of aspirin: Secondary | ICD-10-CM | POA: Diagnosis not present

## 2021-05-10 DIAGNOSIS — D509 Iron deficiency anemia, unspecified: Secondary | ICD-10-CM | POA: Diagnosis not present

## 2021-05-10 DIAGNOSIS — Z885 Allergy status to narcotic agent status: Secondary | ICD-10-CM | POA: Diagnosis not present

## 2021-05-10 DIAGNOSIS — Z825 Family history of asthma and other chronic lower respiratory diseases: Secondary | ICD-10-CM | POA: Diagnosis not present

## 2021-05-10 DIAGNOSIS — Z7984 Long term (current) use of oral hypoglycemic drugs: Secondary | ICD-10-CM | POA: Diagnosis not present

## 2021-05-10 DIAGNOSIS — Z01812 Encounter for preprocedural laboratory examination: Secondary | ICD-10-CM | POA: Insufficient documentation

## 2021-05-10 DIAGNOSIS — G8929 Other chronic pain: Secondary | ICD-10-CM

## 2021-05-10 DIAGNOSIS — I10 Essential (primary) hypertension: Secondary | ICD-10-CM | POA: Diagnosis not present

## 2021-05-10 DIAGNOSIS — Z79899 Other long term (current) drug therapy: Secondary | ICD-10-CM | POA: Diagnosis not present

## 2021-05-10 DIAGNOSIS — E1151 Type 2 diabetes mellitus with diabetic peripheral angiopathy without gangrene: Secondary | ICD-10-CM | POA: Diagnosis not present

## 2021-05-10 DIAGNOSIS — K219 Gastro-esophageal reflux disease without esophagitis: Secondary | ICD-10-CM | POA: Diagnosis not present

## 2021-05-10 LAB — BASIC METABOLIC PANEL
Anion gap: 9 (ref 5–15)
BUN: 11 mg/dL (ref 6–20)
CO2: 24 mmol/L (ref 22–32)
Calcium: 8.7 mg/dL — ABNORMAL LOW (ref 8.9–10.3)
Chloride: 104 mmol/L (ref 98–111)
Creatinine, Ser: 0.59 mg/dL (ref 0.44–1.00)
GFR, Estimated: >60 mL/min (ref >60)
Glucose, Bld: 210 mg/dL — ABNORMAL HIGH (ref 70–99)
Potassium: 3.7 mmol/L (ref 3.5–5.1)
Sodium: 137 mmol/L (ref 135–145)

## 2021-05-10 LAB — CBC WITH DIFFERENTIAL/PLATELET
Abs Immature Granulocytes: 0.08 10*3/uL — ABNORMAL HIGH (ref 0.00–0.07)
Basophils Absolute: 0.1 10*3/uL (ref 0.0–0.1)
Basophils Relative: 1 %
Eosinophils Absolute: 0.3 10*3/uL (ref 0.0–0.5)
Eosinophils Relative: 3 %
HCT: 32.9 % — ABNORMAL LOW (ref 36.0–46.0)
Hemoglobin: 9.1 g/dL — ABNORMAL LOW (ref 12.0–15.0)
Immature Granulocytes: 1 %
Lymphocytes Relative: 29 %
Lymphs Abs: 2.3 10*3/uL (ref 0.7–4.0)
MCH: 21.9 pg — ABNORMAL LOW (ref 26.0–34.0)
MCHC: 27.7 g/dL — ABNORMAL LOW (ref 30.0–36.0)
MCV: 79.3 fL — ABNORMAL LOW (ref 80.0–100.0)
Monocytes Absolute: 0.5 10*3/uL (ref 0.1–1.0)
Monocytes Relative: 7 %
Neutro Abs: 4.7 10*3/uL (ref 1.7–7.7)
Neutrophils Relative %: 59 %
Platelets: 431 10*3/uL — ABNORMAL HIGH (ref 150–400)
RBC: 4.15 MIL/uL (ref 3.87–5.11)
RDW: 22 % — ABNORMAL HIGH (ref 11.5–15.5)
WBC: 7.9 10*3/uL (ref 4.0–10.5)
nRBC: 0 % (ref 0.0–0.2)

## 2021-05-10 MED ORDER — SODIUM CHLORIDE 0.9 % IV SOLN: INTRAVENOUS | Status: DC

## 2021-05-10 MED ORDER — SODIUM CHLORIDE 0.9 % IV SOLN
510.0000 mg | Freq: Once | INTRAVENOUS | Status: AC
  Administered 2021-05-10: 510 mg via INTRAVENOUS
  Filled 2021-05-10: qty 17

## 2021-05-10 NOTE — Patient Instructions (Signed)
Tina Pham  05/10/2021     @PREFPERIOPPHARMACY @   Your procedure is scheduled on 05/13/2021.  Report to 05/15/2021 at 7:40 A.M.  Call this number if you have problems the morning of surgery:  [phone]   Remember:  Do not eat or drink after midnight.                 Take these medicines the morning of surgery with A SIP OF WATER :  Nexium Gabapentin and Hydrocodone (if needed)    Do not wear jewelry, make-up or nail polish.  Do not wear lotions, powders, or perfumes, or deodorant.  Do not shave 48 hours prior to surgery.  Men may shave face and neck.  Do not bring valuables to the hospital.  [facility] is not responsible for any belongings or valuables.  Contacts, dentures or bridgework may not be worn into surgery.  Leave your suitcase in the car.  After surgery it may be brought to your room.  For patients admitted to the hospital, discharge time will be determined by your treatment team.  Patients discharged the day of surgery will not be allowed to drive home.   Name and phone number of your driver:   family Special instructions:  n/a  Please read over the following fact sheets that you were given.   Shoulder Arthroscopy Shoulder arthroscopy is a surgery to examine the inside of the shoulder joint to diagnose or repair any damage. This surgery may be used to: Repair a torn ligament. Ligaments are tissues that connect bones to each other. Repair a labral tear, which is a tear in the cartilage that lines the rim of the shoulder socket. Treat shoulder impingement, which occurs when connective tissues (tendons) surrounding the shoulder joint become pinched. Repair a tear in the muscles and tendons that support the upper arm bone and shoulder (rotator cuff injury). Remove an overgrowth of bone that has caused damage to soft tissue. This is called a bone spur. Remove a cyst, which is a fluid-filled sac. Treat an advanced infection in the shoulder (septic  joint). Repair a broken bone, or fracture, in the shoulder. Arthroscopic surgery is done using a thin tube that has a light and camera on the end of it. This tool is called an arthroscope. The arthroscope is placed through a small incision, and the camera sends images to a screen in the operating room. The images are used to help perform the surgery. Tell a health care provider about: Any allergies you have. All medicines you are taking, including vitamins, herbs, eye drops, creams, and over-the-counter medicines. Any problems you or family members have had with anesthetic medicines. Any blood disorders you have. Any surgeries you have had. Any medical conditions you have. Whether you are pregnant or may be pregnant. What are the risks? Generally, this is a safe procedure. However, problems may occur, including: Infection. Bleeding. Allergic reactions to medicines. Damage to blood vessels, nerves, or other tissues in the shoulder. A blood clot that forms in the arm or leg and travels to the lung (pulmonary embolism). Stiffness and pain that does not go away. What happens before the procedure? Staying hydrated Follow instructions from your health care provider about hydration, which may include: Up to 2 hours before the procedure - you may continue to drink clear liquids, such as water, clear fruit juice, black coffee, and plain tea.  Eating and drinking restrictions Follow instructions from your health care provider about eating and  drinking, which may include: 8 hours before the procedure - stop eating heavy meals or foods, such as meat, fried foods, or fatty foods. 6 hours before the procedure - stop eating light meals or foods, such as toast or cereal. 6 hours before the procedure - stop drinking milk or drinks that contain milk. 2 hours before the procedure - stop drinking clear liquids. Medicines Ask your health care provider about: Changing or stopping your regular medicines.  This is especially important if you are taking diabetes medicines or blood thinners. Taking medicines such as aspirin and ibuprofen. These medicines can thin your blood. Do not take these medicines unless your health care provider tells you to take them. Taking over-the-counter medicines, vitamins, herbs, and supplements. Tests Your health care provider may move your shoulder or ask you to move it in specific ways to see how much motion you have. You may have: Blood tests. Imaging tests, such as an X-ray, an MRI, or a CT scan. A test that records electrical activity in the heart (electrocardiogram, ECG). General instructions Plan to have a responsible adult take you home from the hospital or clinic. If you will be going home right after the procedure, plan to have a responsible adult care for you for the time you are told. This is important. Ask your health care provider: How your surgery site will be marked. What steps will be taken to help prevent infection. These steps may include: Removing hair at the surgery site. Washing skin with a germ-killing soap. Receiving antibiotic medicine. Do not use any products that contain nicotine or tobacco for at least 4 weeks. These products include cigarettes, chewing tobacco, and vaping devices, such as e-cigarettes. These can delay healing. If you need help quitting, ask your health care provider. Do not drink alcohol. What happens during the procedure?  An IV will be inserted into one of your veins. You will be given one or more of the following: A medicine to help you relax (sedative). A medicine to numb the shoulder area (local anesthetic). A medicine to make you fall asleep (general anesthetic). A medicine that is injected into an area of your body to numb everything below the injection site (regional anesthetic). You will be placed in the proper position for surgery, such as lying on your side or sitting up. Several small incisions will be  made in your shoulder. Your shoulder joint will be rinsed out, or flushed, and filled with a germ-free solution (sterile saline). This expands the area to allow your surgeon to see the joint more clearly. An arthroscope will be passed through one of your incisions, into your shoulder joint. Other surgical instruments will be passed through the other incisions. Your surgeon will examine and repair your shoulder as needed. The sterile fluid will be drained from your shoulder. Your incisions will be closed with adhesive strips or stitches and covered with a bandage (dressing). Your arm may be placed in a sling or a different device to keep it in place while you heal (immobilizer). The procedure may vary among health care providers and hospitals. What happens after the procedure? Your blood pressure, heart rate, breathing rate, and blood oxygen level will be monitored until you leave the hospital or clinic. The blood vessel and nerve function in your affected arm will be checked. You will be given pain medicine as needed. Do not drive until your health care provider approves. Summary Shoulder arthroscopy is a surgery to examine the inside of the shoulder joint  to diagnose or repair any damage. Plan to have a responsible adult take you home from the hospital or clinic and care for you for the time you are told. Your arm may be placed in a sling or a different device to keep it in place while you heal (immobilizer). Do not drive until your health care provider approves. This information is not intended to replace advice given to you by your health care provider. Make sure you discuss any questions you have with your health care provider. Document Revised: 03/24/2020 Document Reviewed: 03/08/2020 Elsevier Patient Education  2022 [employer] Inc[employer]  Care and Recovery After SurgerySurgery for Rotator Cuff Tear The rotator cuff is a group of muscles and connective tissues (tendons) that surround the  shoulder joint and keep the upper arm bone (humerus) in the shoulder socket. A tendon is the place on a muscle where it attaches to a bone. Surgery may be done to repair a partial or complete tear in the rotator cuff that cannot be treated by nonsurgical methods. The exact procedure that you will have depends on your injury. If you have a partial tear, you may have surgery to reattach a tendon to the humerus. If you have a complete tear, you may have surgery to sew the two sides of the tear back together. Surgery may be done through small incisions using an operating telescope (arthroscope), through a larger (open) incision, or through a combination of both. Tell a health care provider about: Any allergies you have. All medicines you are taking, including vitamins, herbs, eye drops, creams, and over-the-counter medicines. Any problems you or family members have had with anesthetic medicines. Any blood disorders you have. Any surgeries you have had. Any medical conditions you have. Whether you are pregnant or may be pregnant. What are the risks? Generally, this is a safe procedure. However, problems may occur, including: Infection. Bleeding. Allergic reactions to medicines or materials used during the procedure. Damage to nerves, blood vessels, or shoulder muscles. Permanent loss of full shoulder movement (stiffness). What happens before the procedure? Staying hydrated Follow instructions from your health care provider about hydration, which may include: Up to 2 hours before the procedure - you may continue to drink clear liquids, such as water, clear fruit juice, black coffee, and plain tea.  Eating and drinking restrictions Follow instructions from your health care provider about eating and drinking, which may include: 8 hours before the procedure - stop eating heavy meals or foods, such as meat, fried foods, or fatty foods. 6 hours before the procedure - stop eating light meals or foods,  such as toast or cereal. 6 hours before the procedure - stop drinking milk or drinks that contain milk. 2 hours before the procedure - stop drinking clear liquids. Medicines Ask your health care provider about: Changing or stopping your regular medicines. This is especially important if you are taking diabetes medicines or blood thinners. Taking medicines such as aspirin and ibuprofen. These medicines can thin your blood. Do not take these medicines unless your health care provider tells you to take them. Taking over-the-counter medicines, vitamins, herbs, and supplements. General instructions Do not use any products that contain nicotine or tobacco for at least 4 weeks before the procedure. These products include cigarettes, e-cigarettes, and chewing tobacco. If you need help quitting, ask your health care provider. Ask your health care provider what steps will be taken to help prevent infection. These may include: Removing hair at the surgery site. Washing skin with a  germ-killing soap. Taking antibiotic medicine. Plan to have a responsible adult take you home from the hospital or clinic. Plan to have a responsible adult care for you for the time you are told after you leave the hospital or clinic. This is important. What happens during the procedure?  An IV will be inserted into one of your veins. You will be given one or more of the following: A medicine to help you relax (sedative). A medicine that is injected into an area of your body to numb everything beyond the injection site (regional anesthetic). A medicine to make you fall asleep (general anesthetic). Your surgeon will make incisions based on the type of surgery you are having. If you are having arthroscopic surgery: Small incisions will be made in the front and back of your shoulder. An arthroscope will be inserted through these incisions to examine the inside of your shoulder and plan the surgery. If you are having open  surgery, a wider incision will be made in your shoulder. Some of the muscle covering your shoulder (deltoid muscle) may be moved to expose your rotator cuff. Bony growths that might interfere with healing will be removed. Your rotator cuff will be trimmed around the area where it has torn away from your humerus. If your rotator cuff is completely torn, the split ends will be sewn back together. Anchoring inserts will be placed into your humerus in the area where the tendon has torn away from the bone. The torn end of your rotator cuff will be reattached (anchored) to your humerus using stitches and small screws. Your incisions will be closed with stitches (sutures). The incision in your skin will be covered with a bandage (dressing) and medicine. Your arm will be placed in a sling or a shoulder immobilizer. A shoulder immobilizer keeps your arm from moving (immobilizes your arm) while the injured shoulder heals. The procedure may vary among health care providers and hospitals. What happens after the procedure? Your blood pressure, heart rate, breathing rate, and blood oxygen level will be monitored until you leave the hospital or clinic. You may have some pain. Medicines will be available to help you. Do not drive or operate machinery until your health care provider approves. Summary The rotator cuff is a group of muscles and connective tissues (tendons) that surround the shoulder joint and keep the upper arm bone (humerus) in the shoulder socket. Surgery may be done to repair a partial or complete tear in a rotator cuff. Follow instructions from your health care provider about taking medicines and about eating and drinking before the procedure. After surgery, your arm will be in a sling or a shoulder immobilizer. This information is not intended to replace advice given to you by your health care provider. Make sure you discuss any questions you have with your health care provider. Document  Revised: 12/26/2019 Document Reviewed: 11/12/2019 Elsevier Patient Education  2022 Else[employer] Inc. General Anesthesia, Adult, Care After This sheet gives you information about how to care for yourself after your procedure. Your health care provider may also give you more specific instructions. If you have problems or questions, contact your health care provider. What can I expect after the procedure? After the procedure, the following side effects are common: Pain or discomfort at the IV site. Nausea. Vomiting. Sore throat. Trouble concentrating. Feeling cold or chills. Feeling weak or tired. Sleepiness and fatigue. Soreness and body aches. These side effects can affect parts of the body that were not involved in surgery.  Follow these instructions at home: For the time period you were told by your health care provider:  Rest. Do not participate in activities where you could fall or become injured. Do not drive or use machinery. Do not drink alcohol. Do not take sleeping pills or medicines that cause drowsiness. Do not make important decisions or sign legal documents. Do not take care of children on your own. Eating and drinking Follow any instructions from your health care provider about eating or drinking restrictions. When you feel hungry, start by eating small amounts of foods that are soft and easy to digest (bland), such as toast. Gradually return to your regular diet. Drink enough fluid to keep your urine pale yellow. If you vomit, rehydrate by drinking water, juice, or clear broth. General instructions If you have sleep apnea, surgery and certain medicines can increase your risk for breathing problems. Follow instructions from your health care provider about wearing your sleep device: Anytime you are sleeping, including during daytime naps. While taking prescription pain medicines, sleeping medicines, or medicines that make you drowsy. Have a responsible adult stay with you  for the time you are told. It is important to have someone help care for you until you are awake and alert. Return to your normal activities as told by your health care provider. Ask your health care provider what activities are safe for you. Take over-the-counter and prescription medicines only as told by your health care provider. If you smoke, do not smoke without supervision. Keep all follow-up visits as told by your health care provider. This is important. Contact a health care provider if: You have nausea or vomiting that does not get better with medicine. You cannot eat or drink without vomiting. You have pain that does not get better with medicine. You are unable to pass urine. You develop a skin rash. You have a fever. You have redness around your IV site that gets worse. Get help right away if: You have difficulty breathing. You have chest pain. You have blood in your urine or stool, or you vomit blood. Summary After the procedure, it is common to have a sore throat or nausea. It is also common to feel tired. Have a responsible adult stay with you for the time you are told. It is important to have someone help care for you until you are awake and alert. When you feel hungry, start by eating small amounts of foods that are soft and easy to digest (bland), such as toast. Gradually return to your regular diet. Drink enough fluid to keep your urine pale yellow. Return to your normal activities as told by your health care provider. Ask your health care provider what activities are safe for you. This information is not intended to replace advice given to you by your health care provider. Make sure you discuss any questions you have with your health care provider. Document Revised: 03/25/2020 Document Reviewed: 10/23/2019 Elsevier Patient Education  2022 [employer].

## 2021-05-11 ENCOUNTER — Encounter (HOSPITAL_COMMUNITY): Payer: Self-pay

## 2021-05-12 NOTE — H&P (Signed)
Tina Pham  05/12/2021  There is no height or weight on file to calculate BMI.  ASSESSMENT AND PLAN:     Arthroscopy right shoulder mini open rotator cuff repair possible AC joint resection  Recurrent rotator cuff tear right shoulder  Chief complaint severe pain right shoulder   HPI 45 year old female has had 2 rotator cuff repairs of the right shoulder 24 August 2018 and then again on January 2021 she had another injury to the right shoulder with a probable rotator cuff repair and then a symptomatic AC joint she is not doing well with tapering of her hydrocodone.  She seems to be symptomatic from the [facility] joint.  However, she has a blood clot and was just stented and vascular surgery recommends that we put off the surgery as long as possible which I would think 6 months would be the right time frame   She did not develop anemia and had to get a hematology consult which she did and now her hemoglobin is 9 and she is ready for surgery  Unfortunately we had to keep her on opioid medication to control her pain while we are waiting for her surgery and this will require postoperative management.  HISTORY SECTION :   ROS  Anemia fatigue otherwise negative   has a past medical history of Anxiety, Arthritis, Chronic back pain, Chronic bronchitis (HCC), Depression, Diabetes mellitus, GERD (gastroesophageal reflux disease), High cholesterol, History of kidney stones, Hypertension, Migraine, Neuropathic pain of foot, Plantar fasciitis, Pneumonia (2011ish), Sciatica, and Torn rotator cuff.   Past Surgical History:  Procedure Laterality Date   ABDOMINAL AORTOGRAM W/LOWER EXTREMITY N/A 07/01/2020   Procedure: ABDOMINAL AORTOGRAM W/LOWER EXTREMITY;  Surgeon: Cephus Shelling, MD;  Location: [facility] IN[facility]AS[facility];  Service: Cardiovascular;  Laterality: N/A;   AMPUTATION Left 07/22/2020   Procedure: LEFT GREAT TOE AMPUTATION;  Surgeon: Maeola Harman, MD;  Location: [facility] OR;   Service: Vascular;  Laterality: Left;   BACK SURGERY     herniated disc; had disc removed and then fusion; total of 3 surgeries   EYE SURGERY Bilateral    removal of cyst and straightening of muscles   KNEE ARTHROSCOPY WITH MEDIAL MENISECTOMY Left 12/27/2016   Procedure: LEFT KNEE DIAGNOSTIC ARTHROSCOPY;  Surgeon: Vickki Hearing, MD;  Location: [facility] ORS;  Service: Orthopedics;  Laterality: Left;   PERIPHERAL VASCULAR INTERVENTION Left 07/01/2020   Procedure: PERIPHERAL VASCULAR INTERVENTION;  Surgeon: Cephus Shelling, MD;  Location: [facility] IN[facility] CV [facility];  Service: Cardiovascular;  Laterality: Left;  common iliac   SHOULDER OPEN ROTATOR CUFF REPAIR Right 09/19/2018   Procedure: ROTATOR CUFF REPAIR SHOULDER OPEN;  Surgeon: Vickki Hearing, MD;  Location: [facility] OR[facility];  Service: Orthopedics;  Laterality: Right;   SHOULDER OPEN ROTATOR CUFF REPAIR Right 08/05/2019   Procedure: ROTATOR CUFF REPAIR SHOULDER OPEN;  Surgeon: Vickki Hearing, MD;  Location: [facility] OR[facility];  Service: Orthopedics;  Laterality: Right;   TUBAL LIGATION      Social History   Socioeconomic History   Marital status: Legally Separated    Spouse name: Not on file   Number of children: Not on file   Years of education: Not on file   Highest education level: Not on file  Occupational History   Not on file  Tobacco Use   Smoking status: Every Day    Packs/day: 1.00    Years: 31.00    Pack years: 31.00    Types: Cigarettes   Smokeless tobacco: Never  Vaping  Use   Vaping Use: Never used  Substance and Sexual Activity   Alcohol use: No   Drug use: No   Sexual activity: Yes    Birth control/protection: Surgical  Other Topics Concern   Not on file  Social History Narrative   Not on file   Social Determinants of Health   Financial Resource Strain: Not on file  Food Insecurity: Not on file  Transportation Needs: Not on file  Physical Activity: Not on file  Stress: Not on file  Social Connections: Not on file   Intimate Partner Violence: Not on file     Family History  Problem Relation Age of Onset   Heart failure Mother    COPD Mother    Diabetes Mother    Heart failure Father    Diabetes Other       Allergies  Allergen Reactions   Codeine Other (See Comments)    Upset Stomach   Metformin Other (See Comments)    Will not take per MD advisement    Benadryl [Diphenhydramine Hcl] Palpitations   Compazine Palpitations    No current facility-administered medications for this encounter.  Current Outpatient Medications:    aspirin EC 81 MG tablet, Take 81 mg by mouth daily. Swallow whole., Disp: , Rfl:    atorvastatin (LIPITOR) 10 MG tablet, Take 1 tablet (10 mg total) by mouth daily., Disp: 30 tablet, Rfl: 11   clopidogrel (PLAVIX) 75 MG tablet, Take 1 tablet (75 mg total) by mouth daily., Disp: 30 tablet, Rfl: 11   esomeprazole (NEXIUM) 20 MG capsule, Take 20 mg by mouth daily as needed (acid reflux)., Disp: , Rfl:    FEROSUL 325 (65 Fe) MG tablet, Take 325 mg by mouth 2 (two) times daily., Disp: , Rfl:    gabapentin (NEURONTIN) 300 MG capsule, TAKE 1 CAPSULE(300 MG) BY MOUTH THREE TIMES DAILY, Disp: 90 capsule, Rfl: 5   glipiZIDE (GLUCOTROL) 10 MG tablet, Take 10 mg by mouth in the morning., Disp: , Rfl:    HYDROcodone-acetaminophen (NORCO) 7.5-325 MG tablet, Take 1 tablet by mouth every 6 (six) hours as needed for moderate pain., Disp: 30 tablet, Rfl: 0   Accu-Chek Softclix Lancets lancets, SMARTSIG:Topical, Disp: , Rfl:    PHYSICAL EXAM SECTION: LMP 05/05/2021   There is no height or weight on file to calculate BMI.   General appearance: Well-developed well-nourished no gross deformities  Eyes clear normal vision no evidence of conjunctivitis or jaundice, extraocular muscles intact  ENT: ears hearing normal, nasal passages clear, throat clear   Neck is supple without palpable mass, full range of motion   Cardiovascular normal pulse and perfusion in all 4 extremities  normal color without edema  Lymph nodes: No lymphadenopathy  Neurologically deep tendon reflexes are equal and normal, no sensation loss or deficits no pathologic reflexes   Skin no lacerations or ulcerations no nodularity no palpable masses, no erythema or nodularity  Psychological: Awake alert and oriented x3 mood and affect normal  Musculoskeletal:  Right shoulder previous skin incisions have healed without neuroma no tenderness to the incision.  Patient has global tenderness of the right shoulder Passive range of motion shows mild restriction in external rotation, 100 degrees of flexion in the scapular plane, decreased internal rotation In abduction she has pain with resisted abduction grade 4 weakness.  In the scapular plane weakness is grade 4 out of 5 as well there is pain in this position as well. Shoulder remained stable  Most recent MRI  IMPRESSION: 1. Prior rotator cuff repair. Moderate tendinosis of the supraspinatus tendon with a full-thickness tear of the anterior supraspinatus tendon measuring 15 mm in anterior-posterior dimension with a few intact posterior fibers. 2. Mild tendinosis of the infraspinatus tendon. 3. 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 4. Mild osteoarthritis of the glenohumeral joint.     Electronically Signed   By: Elige Ko   On: 09/07/2020 16:09         1:11 PM  Tina Pham

## 2021-05-13 ENCOUNTER — Other Ambulatory Visit: Payer: Self-pay

## 2021-05-13 ENCOUNTER — Encounter (HOSPITAL_COMMUNITY)
Admission: RE | Disposition: A | Discharge status: Home / Self Care | Payer: Self-pay | Source: Ambulatory Visit | Attending: Orthopedic Surgery

## 2021-05-13 ENCOUNTER — Ambulatory Visit (HOSPITAL_COMMUNITY): Payer: BC Managed Care – PPO | Admitting: Certified Registered"

## 2021-05-13 ENCOUNTER — Encounter (HOSPITAL_COMMUNITY): Payer: Self-pay | Admitting: Orthopedic Surgery

## 2021-05-13 ENCOUNTER — Ambulatory Visit (HOSPITAL_COMMUNITY)
Admission: RE | Admit: 2021-05-13 | Discharge: 2021-05-13 | Disposition: A | Discharge status: Home / Self Care | Payer: BC Managed Care – PPO | Source: Ambulatory Visit | Attending: Orthopedic Surgery | Admitting: Orthopedic Surgery

## 2021-05-13 ENCOUNTER — Encounter: Payer: Self-pay | Admitting: Orthopedic Surgery

## 2021-05-13 DIAGNOSIS — M75121 Complete rotator cuff tear or rupture of right shoulder, not specified as traumatic: Secondary | ICD-10-CM | POA: Insufficient documentation

## 2021-05-13 DIAGNOSIS — F17210 Nicotine dependence, cigarettes, uncomplicated: Secondary | ICD-10-CM | POA: Insufficient documentation

## 2021-05-13 DIAGNOSIS — M19011 Primary osteoarthritis, right shoulder: Secondary | ICD-10-CM

## 2021-05-13 DIAGNOSIS — Z825 Family history of asthma and other chronic lower respiratory diseases: Secondary | ICD-10-CM | POA: Insufficient documentation

## 2021-05-13 DIAGNOSIS — Z8249 Family history of ischemic heart disease and other diseases of the circulatory system: Secondary | ICD-10-CM | POA: Insufficient documentation

## 2021-05-13 DIAGNOSIS — Z888 Allergy status to other drugs, medicaments and biological substances status: Secondary | ICD-10-CM | POA: Insufficient documentation

## 2021-05-13 DIAGNOSIS — Z833 Family history of diabetes mellitus: Secondary | ICD-10-CM | POA: Insufficient documentation

## 2021-05-13 DIAGNOSIS — K219 Gastro-esophageal reflux disease without esophagitis: Secondary | ICD-10-CM | POA: Insufficient documentation

## 2021-05-13 DIAGNOSIS — Z885 Allergy status to narcotic agent status: Secondary | ICD-10-CM | POA: Insufficient documentation

## 2021-05-13 DIAGNOSIS — Z79899 Other long term (current) drug therapy: Secondary | ICD-10-CM | POA: Insufficient documentation

## 2021-05-13 DIAGNOSIS — I10 Essential (primary) hypertension: Secondary | ICD-10-CM | POA: Insufficient documentation

## 2021-05-13 DIAGNOSIS — S46011A Strain of muscle(s) and tendon(s) of the rotator cuff of right shoulder, initial encounter: Secondary | ICD-10-CM | POA: Diagnosis not present

## 2021-05-13 DIAGNOSIS — Z7982 Long term (current) use of aspirin: Secondary | ICD-10-CM | POA: Insufficient documentation

## 2021-05-13 DIAGNOSIS — Z7984 Long term (current) use of oral hypoglycemic drugs: Secondary | ICD-10-CM | POA: Insufficient documentation

## 2021-05-13 DIAGNOSIS — E1151 Type 2 diabetes mellitus with diabetic peripheral angiopathy without gangrene: Secondary | ICD-10-CM | POA: Insufficient documentation

## 2021-05-13 HISTORY — PX: SHOULDER OPEN ROTATOR CUFF REPAIR: SHX2407

## 2021-05-13 HISTORY — PX: SHOULDER ARTHROSCOPY WITH DISTAL CLAVICLE RESECTION: SHX5675

## 2021-05-13 LAB — PREGNANCY, URINE: Preg Test, Ur: NEGATIVE

## 2021-05-13 LAB — GLUCOSE, CAPILLARY
Glucose-Capillary: 152 mg/dL — ABNORMAL HIGH (ref 70–99)
Glucose-Capillary: 209 mg/dL — ABNORMAL HIGH (ref 70–99)

## 2021-05-13 SURGERY — SHOULDER ARTHROSCOPY WITH DISTAL CLAVICLE RESECTION
Anesthesia: General | Site: Shoulder | Laterality: Right

## 2021-05-13 MED ORDER — MIDAZOLAM HCL 2 MG/2ML IJ SOLN
INTRAMUSCULAR | Status: AC
  Administered 2021-05-13: 2 mg
  Filled 2021-05-13: qty 2

## 2021-05-13 MED ORDER — LIDOCAINE HCL (PF) 1 % IJ SOLN
INTRAMUSCULAR | Status: AC
  Filled 2021-05-13: qty 30

## 2021-05-13 MED ORDER — FENTANYL CITRATE (PF) 100 MCG/2ML IJ SOLN
100.0000 ug | INTRAMUSCULAR | Status: AC | PRN
  Administered 2021-05-13: 50 ug via INTRAVENOUS

## 2021-05-13 MED ORDER — FENTANYL CITRATE (PF) 250 MCG/5ML IJ SOLN
INTRAMUSCULAR | Status: AC
  Filled 2021-05-13: qty 5

## 2021-05-13 MED ORDER — BUPIVACAINE-EPINEPHRINE (PF) 0.5% -1:200000 IJ SOLN
INTRAMUSCULAR | Status: AC
  Filled 2021-05-13: qty 30

## 2021-05-13 MED ORDER — FENTANYL CITRATE (PF) 250 MCG/5ML IJ SOLN
INTRAMUSCULAR | Status: DC | PRN
  Administered 2021-05-13 (×3): 50 ug via INTRAVENOUS
  Administered 2021-05-13: 100 ug via INTRAVENOUS
  Administered 2021-05-13: 50 ug via INTRAVENOUS

## 2021-05-13 MED ORDER — PHENYLEPHRINE 40 MCG/ML (10ML) SYRINGE FOR IV PUSH (FOR BLOOD PRESSURE SUPPORT)
PREFILLED_SYRINGE | INTRAVENOUS | Status: AC
  Filled 2021-05-13: qty 10

## 2021-05-13 MED ORDER — SODIUM CHLORIDE 0.9 % IV SOLN: INTRAVENOUS | Status: DC | PRN

## 2021-05-13 MED ORDER — LACTATED RINGERS IV SOLN: INTRAVENOUS | Status: DC

## 2021-05-13 MED ORDER — PHENYLEPHRINE HCL-NACL 20-0.9 MG/250ML-% IV SOLN
INTRAVENOUS | Status: DC | PRN
  Administered 2021-05-13: 30 ug/min via INTRAVENOUS

## 2021-05-13 MED ORDER — HYDROMORPHONE HCL 1 MG/ML IJ SOLN: 0.2500 mg | INTRAMUSCULAR | Status: DC | PRN

## 2021-05-13 MED ORDER — MIDAZOLAM HCL 2 MG/2ML IJ SOLN
INTRAMUSCULAR | Status: AC
  Filled 2021-05-13: qty 2

## 2021-05-13 MED ORDER — LIDOCAINE HCL (PF) 2 % IJ SOLN
INTRAMUSCULAR | Status: AC
  Filled 2021-05-13: qty 5

## 2021-05-13 MED ORDER — OXYCODONE-ACETAMINOPHEN 7.5-325 MG PO TABS
1.0000 | ORAL_TABLET | ORAL | 0 refills | Status: DC | PRN
Start: 2021-05-13 — End: 2021-05-16

## 2021-05-13 MED ORDER — MIDAZOLAM HCL 2 MG/2ML IJ SOLN: 2.0000 mg | Freq: Once | INTRAMUSCULAR | Status: DC

## 2021-05-13 MED ORDER — CHLORHEXIDINE GLUCONATE 0.12 % MT SOLN
OROMUCOSAL | Status: AC
  Administered 2021-05-13: 15 mL via OROMUCOSAL
  Filled 2021-05-13: qty 15

## 2021-05-13 MED ORDER — ONDANSETRON HCL 4 MG/2ML IJ SOLN
INTRAMUSCULAR | Status: AC
  Filled 2021-05-13: qty 2

## 2021-05-13 MED ORDER — MEPERIDINE HCL 50 MG/ML IJ SOLN: 6.2500 mg | INTRAMUSCULAR | Status: DC | PRN

## 2021-05-13 MED ORDER — MIDAZOLAM HCL 2 MG/2ML IJ SOLN
INTRAMUSCULAR | Status: DC | PRN
  Administered 2021-05-13: 2 mg via INTRAVENOUS

## 2021-05-13 MED ORDER — ROPIVACAINE HCL 5 MG/ML IJ SOLN
INTRAMUSCULAR | Status: DC | PRN
  Administered 2021-05-13: 20 mL via PERINEURAL

## 2021-05-13 MED ORDER — PROMETHAZINE HCL 12.5 MG PO TABS: 12.5000 mg | ORAL_TABLET | Freq: Four times a day (QID) | ORAL | 0 refills | Status: DC | PRN

## 2021-05-13 MED ORDER — ROCURONIUM BROMIDE 100 MG/10ML IV SOLN
INTRAVENOUS | Status: DC | PRN
Start: 2021-05-13 — End: 2021-05-13
  Administered 2021-05-13: 20 mg via INTRAVENOUS
  Administered 2021-05-13: 40 mg via INTRAVENOUS
  Administered 2021-05-13 (×2): 20 mg via INTRAVENOUS

## 2021-05-13 MED ORDER — EPINEPHRINE PF 1 MG/ML IJ SOLN
INTRAMUSCULAR | Status: AC
  Filled 2021-05-13: qty 4

## 2021-05-13 MED ORDER — CHLORHEXIDINE GLUCONATE 0.12 % MT SOLN: 15.0000 mL | Freq: Once | OROMUCOSAL | Status: AC

## 2021-05-13 MED ORDER — LIDOCAINE HCL (CARDIAC) PF 100 MG/5ML IV SOSY
PREFILLED_SYRINGE | INTRAVENOUS | Status: DC | PRN
  Administered 2021-05-13: 100 mg via INTRATRACHEAL

## 2021-05-13 MED ORDER — OXYCODONE-ACETAMINOPHEN 5-325 MG PO TABS: 1.0000 | ORAL_TABLET | ORAL | Status: DC | PRN

## 2021-05-13 MED ORDER — DEXMEDETOMIDINE (PRECEDEX) IN NS 20 MCG/5ML (4 MCG/ML) IV SYRINGE
PREFILLED_SYRINGE | INTRAVENOUS | Status: AC
  Filled 2021-05-13: qty 5

## 2021-05-13 MED ORDER — PROPOFOL 10 MG/ML IV BOLUS
INTRAVENOUS | Status: DC | PRN
  Administered 2021-05-13: 170 mg via INTRAVENOUS

## 2021-05-13 MED ORDER — ONDANSETRON HCL 4 MG/2ML IJ SOLN: 4.0000 mg | Freq: Once | INTRAMUSCULAR | Status: DC | PRN

## 2021-05-13 MED ORDER — ONDANSETRON HCL 4 MG/2ML IJ SOLN
INTRAMUSCULAR | Status: DC | PRN
  Administered 2021-05-13 (×2): 4 mg via INTRAVENOUS

## 2021-05-13 MED ORDER — PHENYLEPHRINE HCL (PRESSORS) 10 MG/ML IV SOLN
INTRAVENOUS | Status: DC | PRN
Start: 2021-05-13 — End: 2021-05-13
  Administered 2021-05-13: 80 ug via INTRAVENOUS
  Administered 2021-05-13: 120 ug via INTRAVENOUS

## 2021-05-13 MED ORDER — ORAL CARE MOUTH RINSE: 15.0000 mL | Freq: Once | OROMUCOSAL | Status: AC

## 2021-05-13 MED ORDER — FENTANYL CITRATE (PF) 100 MCG/2ML IJ SOLN
INTRAMUSCULAR | Status: AC
  Filled 2021-05-13: qty 2

## 2021-05-13 MED ORDER — FENTANYL CITRATE (PF) 100 MCG/2ML IJ SOLN
INTRAMUSCULAR | Status: AC
  Administered 2021-05-13: 50 ug
  Filled 2021-05-13: qty 2

## 2021-05-13 MED ORDER — PROPOFOL 10 MG/ML IV BOLUS
INTRAVENOUS | Status: AC
  Filled 2021-05-13: qty 20

## 2021-05-13 MED ORDER — BUPIVACAINE-EPINEPHRINE 0.5% -1:200000 IJ SOLN
INTRAMUSCULAR | Status: DC | PRN
  Administered 2021-05-13: 16 mL

## 2021-05-13 MED ORDER — ROPIVACAINE HCL 5 MG/ML IJ SOLN
INTRAMUSCULAR | Status: AC
  Filled 2021-05-13: qty 30

## 2021-05-13 MED ORDER — SODIUM CHLORIDE 0.9 % IR SOLN
Status: DC | PRN
  Administered 2021-05-13 (×10): 3000 mL

## 2021-05-13 MED ORDER — LIDOCAINE HCL (PF) 1 % IJ SOLN
INTRAMUSCULAR | Status: DC | PRN
  Administered 2021-05-13: 3 mL

## 2021-05-13 MED ORDER — SUGAMMADEX SODIUM 200 MG/2ML IV SOLN
INTRAVENOUS | Status: DC | PRN
  Administered 2021-05-13: 200 mg via INTRAVENOUS

## 2021-05-13 MED ORDER — CEFAZOLIN SODIUM-DEXTROSE 2-4 GM/100ML-% IV SOLN
INTRAVENOUS | Status: AC
  Filled 2021-05-13: qty 100

## 2021-05-13 MED ORDER — CEFAZOLIN SODIUM-DEXTROSE 2-4 GM/100ML-% IV SOLN
2.0000 g | INTRAVENOUS | Status: AC
  Administered 2021-05-13: 2 g via INTRAVENOUS

## 2021-05-13 MED ORDER — 0.9 % SODIUM CHLORIDE (POUR BTL) OPTIME
TOPICAL | Status: DC | PRN
  Administered 2021-05-13: 1000 mL

## 2021-05-13 SURGICAL SUPPLY — 58 items
APL PRP STRL LF DISP 70% ISPRP (MISCELLANEOUS) ×1
APL SKNCLS STERI-STRIP NONHPOA (GAUZE/BANDAGES/DRESSINGS) ×1
BENZOIN TINCTURE PRP APPL 2/3 (GAUZE/BANDAGES/DRESSINGS) ×2 IMPLANT
BLADE SHAVER TORPEDO 4X13 (MISCELLANEOUS) ×2 IMPLANT
BURR OVAL 8 FLU 4.0X13 (MISCELLANEOUS) ×2 IMPLANT
CANNULA DRILOCK 5.0X75 (CANNULA) ×4 IMPLANT
CHLORAPREP W/TINT 26 (MISCELLANEOUS) ×2 IMPLANT
CLOTH BEACON ORANGE TIMEOUT ST (SAFETY) ×2 IMPLANT
COVER LIGHT HANDLE STERIS (MISCELLANEOUS) ×4 IMPLANT
CUTTER BONE 4.0MM X 13CM (MISCELLANEOUS) ×2 IMPLANT
DRAPE SHOULDER BEACH CHAIR (DRAPES) ×2 IMPLANT
DRAPE U-SHAPE 47X51 STRL (DRAPES) ×2 IMPLANT
DRSG MEPILEX FLEX 6X6 (GAUZE/BANDAGES/DRESSINGS) ×4 IMPLANT
ELECT REM PT RETURN 9FT ADLT (ELECTROSURGICAL) ×2
ELECTRODE REM PT RTRN 9FT ADLT (ELECTROSURGICAL) ×1 IMPLANT
GAUZE 4X4 16PLY ~~LOC~~+RFID DBL (SPONGE) ×2 IMPLANT
GLOVE SS N UNI LF 8.5 STRL (GLOVE) ×2 IMPLANT
GLOVE SURG POLYISO LF SZ7 (GLOVE) ×2 IMPLANT
GLOVE SURG POLYISO LF SZ8 (GLOVE) ×2 IMPLANT
GLOVE SURG UNDER POLY LF SZ7 (GLOVE) ×4 IMPLANT
GOWN STRL REUS W/TWL LRG LVL3 (GOWN DISPOSABLE) ×4 IMPLANT
GOWN STRL REUS W/TWL XL LVL3 (GOWN DISPOSABLE) ×2 IMPLANT
IMPL SPEEDBRIDGE KIT (Orthopedic Implant) ×1 IMPLANT
IMPLANT SPEEDBRIDGE KIT (Orthopedic Implant) ×2 IMPLANT
INST SET MINOR BONE (KITS) ×2 IMPLANT
IV NS IRRIG 3000ML ARTHROMATIC (IV SOLUTION) ×20 IMPLANT
KIT BLADEGUARD II DBL (SET/KITS/TRAYS/PACK) ×2 IMPLANT
KIT CVD SPEAR FBRTK 1.8 DRILL (KITS) ×2 IMPLANT
KIT POSITION SHOULDER SCHLEI (MISCELLANEOUS) ×2 IMPLANT
KIT TURNOVER KIT A (KITS) ×2 IMPLANT
MANIFOLD NEPTUNE II (INSTRUMENTS) ×2 IMPLANT
MARKER SKIN DUAL TIP RULER LAB (MISCELLANEOUS) ×2 IMPLANT
NEEDLE HYPO 18GX1.5 BLUNT FILL (NEEDLE) ×4 IMPLANT
NEEDLE HYPO 21X1.5 SAFETY (NEEDLE) ×2 IMPLANT
NEEDLE MAYO 1/2 CRC TROCAR PT (NEEDLE) ×2 IMPLANT
NEEDLE SCORPION MULTI FIRE (NEEDLE) ×2 IMPLANT
NEEDLE SPNL 18GX3.5 QUINCKE PK (NEEDLE) ×4 IMPLANT
PACK TOTAL JOINT (CUSTOM PROCEDURE TRAY) ×2 IMPLANT
PAD ARMBOARD 7.5X6 YLW CONV (MISCELLANEOUS) ×2 IMPLANT
PORT APPOLLO RF 90DEGREE MULTI (SURGICAL WAND) ×2 IMPLANT
SET ARTHROSCOPY INST (INSTRUMENTS) ×2 IMPLANT
SET BASIN LINEN APH (SET/KITS/TRAYS/PACK) ×2 IMPLANT
SLING ARM FOAM STRAP XLG (SOFTGOODS) ×2 IMPLANT
SPONGE T-LAP 18X18 ~~LOC~~+RFID (SPONGE) ×2 IMPLANT
STRIP CLOSURE SKIN 1/2X4 (GAUZE/BANDAGES/DRESSINGS) ×4 IMPLANT
SUT ETHIBOND NAB OS 4 #2 30IN (SUTURE) ×4 IMPLANT
SUT ETHILON 3 0 FSL (SUTURE) ×2 IMPLANT
SUT LASSO 45 DEGREE (SUTURE) ×2 IMPLANT
SUT MON AB 0 CT1 (SUTURE) ×2 IMPLANT
SUT MON AB 2-0 SH 27 (SUTURE) ×2
SUT MON AB 2-0 SH27 (SUTURE) ×1 IMPLANT
SYR 10ML LL (SYRINGE) ×2 IMPLANT
SYR 30ML LL (SYRINGE) ×4 IMPLANT
SYR BULB IRRIG 60ML STRL (SYRINGE) ×4 IMPLANT
TOWEL OR 17X26 4PK STRL BLUE (TOWEL DISPOSABLE) ×2 IMPLANT
TUBING IN/OUT FLOW W/MAIN PUMP (TUBING) ×2 IMPLANT
WAND 90 DEG TURBOVAC W/CORD (SURGICAL WAND) ×2 IMPLANT
YANKAUER SUCT BULB TIP 10FT TU (MISCELLANEOUS) ×4 IMPLANT

## 2021-05-13 NOTE — Interval H&P Note (Signed)
History and Physical Interval Note:  05/13/2021 9:29 AM  Tina Pham  has presented today for surgery, with the diagnosis of Acromioclavicular arthrosis recurrent rotator cuff tear right shoulder.  The various methods of treatment have been discussed with the patient and family. After consideration of risks, benefits and other options for treatment, the patient has consented to  Procedure(s): SHOULDER ARTHROSCOPY WITH DISTAL CLAVICLE RESECTION Possible rotator cuff repair (Right) as a surgical intervention.  The patient's history has been reviewed, patient examined, no change in status, stable for surgery.  I have reviewed the patient's chart and labs.  Questions were answered to the patient's satisfaction.     Fuller Canada

## 2021-05-13 NOTE — Transfer of Care (Signed)
Immediate Anesthesia Transfer of Care Note  Patient: [facility]  Procedure(s) Performed: ARTHROSCOPY RIGHT SHOULDER WITH DISTAL CLAVICLE EXCISION (Right: Shoulder) OPEN ROTATOR CUFF REPAIR RIGHT SHOULDER (Right: Shoulder)  Patient Location: PACU  Anesthesia Type:GA combined with regional for post-op pain  Level of Consciousness: awake, alert , oriented and patient cooperative  Airway & Oxygen Therapy: Patient Spontanous Breathing and Patient connected to nasal cannula oxygen  Post-op Assessment: Report given to RN and Post -op Vital signs reviewed and stable  Post vital signs: Reviewed and stable  Last Vitals:  Vitals Value Taken Time  BP 121/65 05/13/21 1345  Temp    Pulse 91 05/13/21 1346  Resp 16 05/13/21 1346  SpO2 85 % 05/13/21 1346  Vitals shown include unvalidated device data.  Last Pain:  Vitals:   05/13/21 0806  TempSrc: Oral  PainSc: 5       Patients Stated Pain Goal: 6 (05/13/21 0806)  Complications: No notable events documented.

## 2021-05-13 NOTE — Anesthesia Preprocedure Evaluation (Signed)
Anesthesia Evaluation  Patient identified by MRN, date of birth, ID band Patient awake    Reviewed: Allergy & Precautions, NPO status , Patient's Chart, lab work & pertinent test results  Airway Mallampati: II  TM Distance: >3 FB Neck ROM: Full    Dental  (+) Edentulous Upper, Edentulous Lower   Pulmonary pneumonia, Current Smoker and Patient abstained from smoking.,    Pulmonary exam normal breath sounds clear to auscultation       Cardiovascular hypertension, Pt. on medications + Peripheral Vascular Disease  Normal cardiovascular exam Rhythm:Regular Rate:Normal     Neuro/Psych  Headaches, PSYCHIATRIC DISORDERS Anxiety Depression  Neuromuscular disease    GI/Hepatic Neg liver ROS, GERD  Medicated, Poorly Controlled and Controlled,  Endo/Other  diabetes, Well Controlled, Type 2, Oral Hypoglycemic Agents  Renal/GU negative Renal ROS     Musculoskeletal  (+) Arthritis ,   Abdominal   Peds  Hematology   Anesthesia Other Findings   Reproductive/Obstetrics negative OB ROS                            Anesthesia Physical Anesthesia Plan  ASA: 3  Anesthesia Plan: General   Post-op Pain Management:  Regional for Post-op pain   Induction: Intravenous  PONV Risk Score and Plan: 3 and Ondansetron and Dexamethasone  Airway Management Planned: Oral ETT  Additional Equipment:   Intra-op Plan:   Post-operative Plan: Extubation in OR  Informed Consent: I have reviewed the patients History and Physical, chart, labs and discussed the procedure including the risks, benefits and alternatives for the proposed anesthesia with the patient or authorized representative who has indicated his/her understanding and acceptance.     Dental advisory given  Plan Discussed with: CRNA and Surgeon  Anesthesia Plan Comments:        Anesthesia Quick Evaluation

## 2021-05-13 NOTE — Interval H&P Note (Signed)
History and Physical Interval Note:  05/13/2021 9:29 AM  Tina Pham  has presented today for surgery, with the diagnosis of Acromioclavicular arthrosis recurrent rotator cuff tear right shoulder.  The various methods of treatment have been discussed with the patient and family. After consideration of risks, benefits and other options for treatment, the patient has consented to  Procedure(s): SHOULDER ARTHROSCOPY WITH DISTAL CLAVICLE RESECTION Possible rotator cuff repair (Right) as a surgical intervention.  The patient's history has been reviewed, patient examined, no change in status, stable for surgery.  I have reviewed the patient's chart and labs.  Questions were answered to the patient's satisfaction.     Lars Jeziorski   

## 2021-05-13 NOTE — Brief Op Note (Signed)
05/13/2021  2:10 PM  PATIENT:  Tina Pham  45 y.o. 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 foramen  PROCEDURE:  Procedure(s): ARTHROSCOPY RIGHT SHOULDER WITH DISTAL CLAVICLE EXCISION (Right) With revision rotator cuff repair right shoulder  SURGEON:  Surgeon(s) and Role:    Vickki Hearing, MD - Primary  PHYSICIAN ASSISTANT:   ASSISTANTS: Canary Brim  ANESTHESIA:   general and paracervical block  EBL:  none   BLOOD ADMINISTERED:none  DRAINS: none   LOCAL MEDICATIONS USED:  MARCAINE     SPECIMEN:  No Specimen  DISPOSITION OF SPECIMEN:  N/A  COUNTS:  YES  TOURNIQUET:  * No tourniquets in log *  DICTATION: .Dragon Dictation  PLAN OF CARE: Admit for overnight observation  PATIENT DISPOSITION:  PACU - hemodynamically stable.   Delay start of Pharmacological VTE agent (>24hrs) due to surgical blood loss or risk of bleeding: not applicable  Donnald Garre was seen in the preop area surgical site confirmed right shoulder instrument and room check was performed images were reviewed patient was marked as right shoulder with surgeon's initials  Patient taken the operating room for general anesthesia antibiotics were given 2 g Ancef patient was placed in modified beachchair position with appropriate padding.  Under anesthesia there was no range of motion deficits and no instability  The patient was prepped and draped sterilely  The timeout was completed  The portals were injected around the shoulder joint injecting the posterior portal anterolateral and posterior lateral portal and anterior portal  11 blade was used to make the posterior portal the scope was placed into the joint.  Diagnostic arthroscopy was performed  Findings were  Normal glenoid labrum  Normal humeral head  Sublabial foramen  with degeneration of the biceps anchor  Normal inferior labrum  Fraying of the undersurface of the anterior portion of the supraspinatus  In the subacromial space the recurrent rotator cuff was noted with anchor still intact but sutures pulled through the cuff minimal retraction.  It was an anterior supraspinatus tear full-thickness  AC joint arthritis  Further details of the surgery.  An anterior portal was used to debride the degeneration of the labrum.  The anterior superior labral detachment was anatomic variant.  The scope was placed in the subacromial space there was minimal bursal inflammation  That tissue was debrided a lateral portal was used to resect the soft tissue around the [facility] joint and the distal clavicle was removed first from the end then another anterior portal was made in line with the acromioclavicular joint and a bur was used to remove approximately 8 mm of distal clavicle  I then turned my attention back to the rotator cuff tear it appeared that the sutures had pulled through.  Made a lateral portal in line with the first lateral portal from the arthroscopy divide subcutaneous tissue down to the deltoid split the deltoid fascia up to the lateral acromion but did not detach it we placed deep retractors using a wheat Zada Girt and then a Gelpi retractor and debrided the rotator cuff tear remove the sutures and then placed two #2 Ethibond sutures to control the tendon.  We then placed 2 swivel lock anchors passed the sutures through them crisscross the 2 middle sutures putting the right suture in the left ankle and the left suture in the right anchor and then used 2 additional swivel locks to anchor  to the lateral humerus  This gave an excellent repair.  We did have 1 dogear anteriorly and posteriorly and we used the #2 Ethibond and one of the retention sutures to repair that  Range of motion was excellent  Irrigation was performed deltoid was closed with 0 Monocryl followed  by subcutaneous tissue with 0 Monocryl and 2-0 Monocryl and 3-0 nylon in the portals  Sterile dressings were applied along with a sling  Patient extubated and taken to recovery room in stable condition

## 2021-05-13 NOTE — Anesthesia Procedure Notes (Signed)
Anesthesia Regional Block: Interscalene brachial plexus block   Pre-Anesthetic Checklist: , timeout performed,  Correct Patient, Correct Site, Correct Laterality,  Correct Procedure, Correct Position, site marked,  Risks and benefits discussed,  Surgical consent,  Pre-op evaluation,  At surgeon's request and post-op pain management  Laterality: Upper and Right  Prep: chloraprep       Needles:  Injection technique: Single-shot  Needle Type: Echogenic Stimulator Needle     Needle Length: 8.3cm  Needle Gauge: 22   Needle insertion depth: 5 cm   Additional Needles:   Procedures:,,,, ultrasound used (permanent image in chart),,    Narrative:  Start time: 05/13/2021 8:56 AM End time: 05/13/2021 9:07 AM Injection made incrementally with aspirations every 5 mL.  Performed by: Personally  Anesthesiologist: Molli Barrows, MD  Additional Notes: Block assessed prior to start of surgery

## 2021-05-13 NOTE — Op Note (Signed)
05/13/2021  2:10 PM  PATIENT:  Tina Pham  45 y.o. 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 foramen  PROCEDURE:  Procedure(s): ARTHROSCOPY RIGHT SHOULDER WITH DISTAL CLAVICLE EXCISION (Right) With revision rotator cuff repair right shoulder  SURGEON:  Surgeon(s) and Role:    Vickki Hearing, MD - Primary  PHYSICIAN ASSISTANT:   ASSISTANTS: Canary Brim  ANESTHESIA:   general and paracervical block  EBL:  none   BLOOD ADMINISTERED:none  DRAINS: none   LOCAL MEDICATIONS USED:  MARCAINE     SPECIMEN:  No Specimen  DISPOSITION OF SPECIMEN:  N/A  COUNTS:  YES  TOURNIQUET:  * No tourniquets in log *  DICTATION: .Dragon Dictation  PLAN OF CARE: Admit for overnight observation  PATIENT DISPOSITION:  PACU - hemodynamically stable.   Delay start of Pharmacological VTE agent (>24hrs) due to surgical blood loss or risk of bleeding: not applicable  Donnald Garre was seen in the preop area surgical site confirmed right shoulder instrument and room check was performed images were reviewed patient was marked as right shoulder with surgeon's initials  Patient taken the operating room for general anesthesia antibiotics were given 2 g Ancef patient was placed in modified beachchair position with appropriate padding.  Under anesthesia there was no range of motion deficits and no instability  The patient was prepped and draped sterilely  The timeout was completed  The portals were injected around the shoulder joint injecting the posterior portal anterolateral and posterior lateral portal and anterior portal  11 blade was used to make the posterior portal the scope was placed into the joint.  Diagnostic arthroscopy was performed  Findings were  Normal glenoid labrum  Normal humeral head  Sublabial foramen  with degeneration of the biceps anchor  Normal inferior labrum  Fraying of the undersurface of the anterior portion of the supraspinatus  In the subacromial space the recurrent rotator cuff was noted with anchor still intact but sutures pulled through the cuff minimal retraction.  It was an anterior supraspinatus tear full-thickness  AC joint arthritis  Further details of the surgery.  An anterior portal was used to debride the degeneration of the labrum.  The anterior superior labral detachment was anatomic variant.  The scope was placed in the subacromial space there was minimal bursal inflammation  That tissue was debrided a lateral portal was used to resect the soft tissue around the [facility] joint and the distal clavicle was removed first from the end then another anterior portal was made in line with the acromioclavicular joint and a bur was used to remove approximately 8 mm of distal clavicle  I then turned my attention back to the rotator cuff tear it appeared that the sutures had pulled through.  Made a lateral portal in line with the first lateral portal from the arthroscopy divide subcutaneous tissue down to the deltoid split the deltoid fascia up to the lateral acromion but did not detach it we placed deep retractors using a wheat Zada Girt and then a Gelpi retractor and debrided the rotator cuff tear remove the sutures and then placed two #2 Ethibond sutures to control the tendon.  We then placed 2 swivel lock anchors passed the sutures through them crisscross the 2 middle sutures putting the right suture in the left ankle and the left suture in the right anchor and then used 2 additional swivel locks to anchor  to the lateral humerus  This gave an excellent repair.  We did have 1 dogear anteriorly and posteriorly and we used the #2 Ethibond and one of the retention sutures to repair that  Range of motion was excellent  Irrigation was performed deltoid was closed with 0 Monocryl followed  by subcutaneous tissue with 0 Monocryl and 2-0 Monocryl and 3-0 nylon in the portals  Sterile dressings were applied along with a sling  Patient extubated and taken to recovery room in stable condition

## 2021-05-13 NOTE — Brief Op Note (Signed)
05/13/2021  1:38 PM  PATIENT:  Tina Pham  45 y.o. 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 foramen  PROCEDURE:  Procedure(s): ARTHROSCOPY RIGHT SHOULDER WITH DISTAL CLAVICLE EXCISION (Right) With revision rotator cuff repair right shoulder  SURGEON:  Surgeon(s) and Role:    Vickki Hearing, MD - Primary  PHYSICIAN ASSISTANT:   ASSISTANTS: Canary Brim  ANESTHESIA:   general and paracervical block  EBL:  none   BLOOD ADMINISTERED:none  DRAINS: none   LOCAL MEDICATIONS USED:  MARCAINE     SPECIMEN:  No Specimen  DISPOSITION OF SPECIMEN:  N/A  COUNTS:  YES  TOURNIQUET:  * No tourniquets in log *  DICTATION: .Dragon Dictation  PLAN OF CARE: Admit for overnight observation  PATIENT DISPOSITION:  PACU - hemodynamically stable.   Delay start of Pharmacological VTE agent (>24hrs) due to surgical blood loss or risk of bleeding: not applicable  Donnald Garre was seen in the preop area surgical site confirmed right shoulder instrument and room check was performed images were reviewed patient was marked as right shoulder with surgeon's initials  Patient taken the operating room for general anesthesia antibiotics were given 2 g Ancef patient was placed in modified beachchair position with appropriate padding.  Under anesthesia there was no range of motion deficits and no instability  The patient was prepped and draped sterilely  The timeout was completed  The portals were injected around the shoulder joint injecting the posterior portal anterolateral and posterior lateral portal and anterior portal  11 blade was used to make the posterior portal the scope was placed into the joint.  Diagnostic arthroscopy was performed  Findings were  Normal glenoid labrum  Normal humeral head  Sublabial foramen  with degeneration of the biceps anchor  Normal inferior labrum  Fraying of the undersurface of the anterior portion of the supraspinatus  In the subacromial space the recurrent rotator cuff was noted with anchor still intact but sutures pulled through the cuff minimal retraction.  It was an anterior supraspinatus tear full-thickness  AC joint arthritis  Further details of the surgery.  An anterior portal was used to debride the degeneration of the labrum.  The anterior superior labral detachment was anatomic variant.  The scope was placed in the subacromial space there was minimal bursal inflammation  That tissue was debrided a lateral portal was used to resect the soft tissue around the [facility] joint and the distal clavicle was removed first from the end then another anterior portal was made in line with the acromioclavicular joint and a bur was used to remove approximately 8 mm of distal clavicle  I then turned my attention back to the rotator cuff tear it appeared that the sutures had pulled through.  Made a lateral portal in line with the first lateral portal from the arthroscopy divide subcutaneous tissue down to the deltoid split the deltoid fascia up to the lateral acromion but did not detach it we placed deep retractors using a wheat Zada Girt and then a Gelpi retractor and debrided the rotator cuff tear remove the sutures and then placed two #2 Ethibond sutures to control the tendon.  We then placed 2 swivel lock anchors passed the sutures through them crisscross the 2 middle sutures putting the right suture in the left ankle and the left suture in the right anchor and then used 2 additional swivel locks to anchor  to the lateral humerus  This gave an excellent repair.  We did have 1 dogear anteriorly and posteriorly and we used the #2 Ethibond and one of the retention sutures to repair that  Range of motion was excellent  Irrigation was performed deltoid was closed with 0 Monocryl followed  by subcutaneous tissue with 0 Monocryl and 2-0 Monocryl and 3-0 nylon in the portals  Sterile dressings were applied along with a sling  Patient extubated and taken to recovery room in stable condition

## 2021-05-13 NOTE — Anesthesia Postprocedure Evaluation (Signed)
Anesthesia Post Note  Patient: Dominika Losey Mowrer  Procedure(s) Performed: ARTHROSCOPY RIGHT SHOULDER WITH DISTAL CLAVICLE EXCISION (Right: Shoulder) OPEN ROTATOR CUFF REPAIR RIGHT SHOULDER (Right: Shoulder)  Patient location during evaluation: PACU Anesthesia Type: General Level of consciousness: awake and alert and oriented Pain management: pain level controlled Vital Signs Assessment: post-procedure vital signs reviewed and stable Respiratory status: spontaneous breathing, nonlabored ventilation and respiratory function stable Cardiovascular status: blood pressure returned to baseline and stable Postop Assessment: no apparent nausea or vomiting Anesthetic complications: no   No notable events documented.   Last Vitals:  Vitals:   05/13/21 1345 05/13/21 1400  BP: 121/65 126/79  Pulse: 88 87  Resp: 13 15  Temp: 37.2 C   SpO2: 98% 97%    Last Pain:  Vitals:   05/13/21 1345  TempSrc:   PainSc: Asleep                 Marquis Down C Maryama Kuriakose

## 2021-05-16 ENCOUNTER — Encounter (HOSPITAL_COMMUNITY): Payer: Self-pay | Admitting: Orthopedic Surgery

## 2021-05-16 ENCOUNTER — Other Ambulatory Visit: Payer: Self-pay

## 2021-05-16 MED ORDER — OXYCODONE-ACETAMINOPHEN 7.5-325 MG PO TABS
1.0000 | ORAL_TABLET | ORAL | 0 refills | Status: DC | PRN
  Filled 2021-05-16: qty 20, 4d supply, fill #0

## 2021-05-17 ENCOUNTER — Other Ambulatory Visit: Payer: Self-pay | Admitting: Radiology

## 2021-05-17 ENCOUNTER — Other Ambulatory Visit: Payer: Self-pay | Admitting: Orthopedic Surgery

## 2021-05-17 ENCOUNTER — Other Ambulatory Visit: Payer: Self-pay

## 2021-05-17 NOTE — Telephone Encounter (Signed)
  Patient called and wants to know why her medicine was called into pharmacy below she has never gotten her medicine there.  She gets her medicine sent to [facility] pharmacy on [address].     Pharmacy  [facility]  [address], [place] [address]  Phone:  [phone]  Fax:  [phone]  DEA #:  [id number]

## 2021-05-17 NOTE — Telephone Encounter (Signed)
Pain meds sent to wrong [facility] she has never used their pharmacy, uses [facility]. Please send to correct pharmacy

## 2021-05-18 MED ORDER — OXYCODONE-ACETAMINOPHEN 7.5-325 MG PO TABS: 1.0000 | ORAL_TABLET | ORAL | 0 refills | Status: DC | PRN

## 2021-05-18 NOTE — Addendum Note (Signed)
Addended by: Fuller Canada E on: 05/18/2021 12:00 PM   Modules accepted: Orders

## 2021-05-18 NOTE — Telephone Encounter (Signed)
So can you resend???

## 2021-05-19 ENCOUNTER — Other Ambulatory Visit: Payer: Self-pay

## 2021-05-19 MED ORDER — OXYCODONE-ACETAMINOPHEN 7.5-325 MG PO TABS: 1.0000 | ORAL_TABLET | ORAL | 0 refills | Status: DC | PRN

## 2021-05-25 ENCOUNTER — Other Ambulatory Visit: Payer: Self-pay

## 2021-05-25 MED ORDER — OXYCODONE-ACETAMINOPHEN 7.5-325 MG PO TABS: 1.0000 | ORAL_TABLET | ORAL | 0 refills | Status: DC | PRN

## 2021-05-26 ENCOUNTER — Ambulatory Visit (INDEPENDENT_AMBULATORY_CARE_PROVIDER_SITE_OTHER): Payer: BC Managed Care – PPO | Admitting: Orthopedic Surgery

## 2021-05-26 ENCOUNTER — Encounter: Payer: Self-pay | Admitting: Orthopedic Surgery

## 2021-05-26 ENCOUNTER — Other Ambulatory Visit: Payer: Self-pay

## 2021-05-26 DIAGNOSIS — Z9889 Other specified postprocedural states: Secondary | ICD-10-CM

## 2021-05-26 MED ORDER — OXYCODONE-ACETAMINOPHEN 7.5-325 MG PO TABS: 1.0000 | ORAL_TABLET | ORAL | 0 refills | Status: DC | PRN

## 2021-05-26 MED ORDER — TIZANIDINE HCL 4 MG PO TABS: 4.0000 mg | ORAL_TABLET | Freq: Three times a day (TID) | ORAL | 1 refills | Status: AC

## 2021-05-26 NOTE — Progress Notes (Signed)
  POST OP   Chief Complaint  Patient presents with   Post-op Follow-up   Dos 10/21  Encounter Diagnosis  Name Primary?   S/P rotator cuff surgery right-05/13/21 Yes    PROCEDURE: PROCEDURE:  Procedure(s): ARTHROSCOPY RIGHT SHOULDER WITH DISTAL CLAVICLE EXCISION (Right) With revision rotator cuff repair right shoulder  POV # 1, postop day #13  Meds related: 7.5 mg Percocet, tizanidine added today  Patient placed in sling shot, sutures removed, wounds clean, follow-up 2 weeks hold therapy until after next visit  Meds ordered this encounter  Medications   oxyCODONE-acetaminophen (PERCOCET) 7.5-325 MG tablet    Sig: Take 1 tablet by mouth every 4 (four) hours as needed for up to 5 days for severe pain.    Dispense:  30 tablet    Refill:  0   tiZANidine (ZANAFLEX) 4 MG tablet    Sig: Take 1 tablet (4 mg total) by mouth 3 (three) times daily for 60 doses.    Dispense:  30 tablet    Refill:  1

## 2021-05-27 ENCOUNTER — Telehealth: Payer: Self-pay | Admitting: Radiology

## 2021-05-27 NOTE — Telephone Encounter (Signed)
Patient came by her incision about midway had a stitch knot poking through, where her dissolvable stitches are, cleaned with alcohol and trimmed the knot. She also had a retained portal suture that was removed too.  She tolerated well, I told her to come in Monday morning if it happens again over weekend, she voiced understanding  To you FYI

## 2021-06-01 ENCOUNTER — Other Ambulatory Visit: Payer: Self-pay

## 2021-06-01 MED ORDER — OXYCODONE-ACETAMINOPHEN 7.5-325 MG PO TABS: 1.0000 | ORAL_TABLET | ORAL | 0 refills | Status: DC | PRN

## 2021-06-06 ENCOUNTER — Other Ambulatory Visit: Payer: Self-pay

## 2021-06-06 MED ORDER — OXYCODONE-ACETAMINOPHEN 7.5-325 MG PO TABS: 1.0000 | ORAL_TABLET | ORAL | 0 refills | Status: DC | PRN

## 2021-06-09 ENCOUNTER — Other Ambulatory Visit: Payer: Self-pay

## 2021-06-09 ENCOUNTER — Other Ambulatory Visit: Payer: Self-pay | Admitting: Radiology

## 2021-06-09 ENCOUNTER — Other Ambulatory Visit: Payer: Self-pay | Admitting: Orthopedic Surgery

## 2021-06-09 ENCOUNTER — Encounter: Payer: Self-pay | Admitting: Orthopedic Surgery

## 2021-06-09 ENCOUNTER — Ambulatory Visit (INDEPENDENT_AMBULATORY_CARE_PROVIDER_SITE_OTHER): Payer: BC Managed Care – PPO | Admitting: Orthopedic Surgery

## 2021-06-09 DIAGNOSIS — Z9889 Other specified postprocedural states: Secondary | ICD-10-CM

## 2021-06-09 MED ORDER — OXYCODONE-ACETAMINOPHEN 7.5-325 MG PO TABS: 1.0000 | ORAL_TABLET | ORAL | 0 refills | Status: AC | PRN

## 2021-06-09 NOTE — Progress Notes (Signed)
Chief Complaint  Patient presents with   Routine Post Op    DOS 05/13/21 S/p rotator cuff surgery RT repeat repair right rotator cuff and distal clavicle excision   Tina Pham is 4 weeks out from her surgical repair of the right rotator cuff  Her wounds look good she still having pain which is expected  I recommend she continue Percocet and Zanaflex  PROCEDURE:  Procedure(s): ARTHROSCOPY RIGHT SHOULDER WITH DISTAL CLAVICLE EXCISION (Right) With revision rotator cuff repair right shoulder Start OT  She will be out of work for the next 8 weeks  Follow-up in 4

## 2021-06-09 NOTE — Progress Notes (Signed)
Meds ordered this encounter  Medications   oxyCODONE-acetaminophen (PERCOCET) 7.5-325 MG tablet    Sig: Take 1 tablet by mouth every 4 (four) hours as needed for up to 5 days for severe pain.    Dispense:  30 tablet    Refill:  0

## 2021-06-09 NOTE — Telephone Encounter (Signed)
Needs refill of Oxycodone runs out the weekend

## 2021-06-14 ENCOUNTER — Other Ambulatory Visit: Payer: Self-pay | Admitting: Orthopedic Surgery

## 2021-06-15 ENCOUNTER — Other Ambulatory Visit: Payer: Self-pay | Admitting: Orthopedic Surgery

## 2021-06-15 ENCOUNTER — Telehealth: Payer: Self-pay | Admitting: Orthopedic Surgery

## 2021-06-15 MED ORDER — OXYCODONE-ACETAMINOPHEN 5-325 MG PO TABS: 1.0000 | ORAL_TABLET | ORAL | 0 refills | Status: DC | PRN

## 2021-06-15 NOTE — Telephone Encounter (Signed)
Called patient regarding a form the [employer] for her out of work status received via fax from case manager [employer], a "release to return to work" form. It reached Dr Romeo Apple directly,and he has filled it out and upon reviewing to send it out, there is no signed authorization form found with it. I have called patient and left message (tried both numbers) - also tried reaching [place]. Auth needed.

## 2021-06-20 NOTE — Telephone Encounter (Signed)
Called back to patient; as no response received to call 11/22; no answer or voice mail.

## 2021-06-21 ENCOUNTER — Other Ambulatory Visit: Payer: Self-pay | Admitting: Orthopedic Surgery

## 2021-06-21 MED ORDER — OXYCODONE-ACETAMINOPHEN 5-325 MG PO TABS: 1.0000 | ORAL_TABLET | ORAL | 0 refills | Status: DC | PRN

## 2021-06-21 NOTE — Telephone Encounter (Signed)
Heard from patient - states authorization signed on line/okay to fax to [employer] [facility] [phone]. Done

## 2021-06-26 ENCOUNTER — Other Ambulatory Visit: Payer: Self-pay | Admitting: Orthopedic Surgery

## 2021-06-27 MED ORDER — OXYCODONE-ACETAMINOPHEN 5-325 MG PO TABS: 1.0000 | ORAL_TABLET | ORAL | 0 refills | Status: DC | PRN

## 2021-06-30 ENCOUNTER — Other Ambulatory Visit: Payer: Self-pay | Admitting: Orthopedic Surgery

## 2021-06-30 ENCOUNTER — Ambulatory Visit (HOSPITAL_COMMUNITY): Payer: BC Managed Care – PPO | Attending: Orthopedic Surgery

## 2021-06-30 ENCOUNTER — Other Ambulatory Visit: Payer: Self-pay

## 2021-06-30 ENCOUNTER — Encounter (HOSPITAL_COMMUNITY): Payer: Self-pay

## 2021-06-30 DIAGNOSIS — M25511 Pain in right shoulder: Secondary | ICD-10-CM | POA: Insufficient documentation

## 2021-06-30 DIAGNOSIS — M25611 Stiffness of right shoulder, not elsewhere classified: Secondary | ICD-10-CM | POA: Insufficient documentation

## 2021-06-30 DIAGNOSIS — R29898 Other symptoms and signs involving the musculoskeletal system: Secondary | ICD-10-CM | POA: Insufficient documentation

## 2021-06-30 MED ORDER — OXYCODONE-ACETAMINOPHEN 5-325 MG PO TABS: 1.0000 | ORAL_TABLET | Freq: Four times a day (QID) | ORAL | 0 refills | Status: DC | PRN

## 2021-06-30 NOTE — Therapy (Addendum)
[facility] [address] [place], [place], [address] Phone: [phone]   Fax:  [phone]  Occupational Therapy Evaluation  Patient Details  Name: Tina Pham MRN: [id number] Date of Birth: 25-Apr-1976 Referring Provider (OT): Fuller Canada, MD   Encounter Date: 06/30/2021   OT End of Session - 06/30/21 1152     Visit Number 1    Number of Visits 24    Date for OT Re-Evaluation 09/22/21   mini reassess:07/28/21   Authorization Type [employer]    Authorization Time Period 30 visit limit. 0 used.    Authorization - Visit Number 1    Authorization - Number of Visits 30    OT Start Time 0945    OT Stop Time 1023    OT Time Calculation (min) 38 min    Activity Tolerance Patient tolerated treatment well    Behavior During Therapy WFL for tasks assessed/performed             Past Medical History:  Diagnosis Date   Anxiety    panic attacks   Arthritis    Chronic back pain    Chronic bronchitis (HCC)    Depression    Diabetes mellitus    GERD (gastroesophageal reflux disease)    prn tums   High cholesterol    History of kidney stones    passed ?   Hypertension    no longer on BP medications   Migraine    Neuropathic pain of foot    Plantar fasciitis    Pneumonia 2011ish   Sciatica    Torn rotator cuff     Past Surgical History:  Procedure Laterality Date   ABDOMINAL AORTOGRAM W/LOWER EXTREMITY N/A 07/01/2020   Procedure: ABDOMINAL AORTOGRAM W/LOWER EXTREMITY;  Surgeon: Cephus Shelling, MD;  Location: [facility] INVASIVE [facility];  Service: Cardiovascular;  Laterality: N/A;   AMPUTATION Left 07/22/2020   Procedure: LEFT GREAT TOE AMPUTATION;  Surgeon: Maeola Harman, MD;  Location: [facility] OR;  Service: Vascular;  Laterality: Left;   BACK SURGERY     herniated disc; had disc removed and then fusion; total of 3 surgeries   EYE SURGERY Bilateral    removal of cyst and straightening of muscles   KNEE  ARTHROSCOPY WITH MEDIAL MENISECTOMY Left 12/27/2016   Procedure: LEFT KNEE DIAGNOSTIC ARTHROSCOPY;  Surgeon: Vickki Hearing, MD;  Location: [facility] ORS;  Service: Orthopedics;  Laterality: Left;   PERIPHERAL VASCULAR INTERVENTION Left 07/01/2020   Procedure: PERIPHERAL VASCULAR INTERVENTION;  Surgeon: Cephus Shelling, MD;  Location: [facility] INVAS[facility] CV LAB;  Service: Cardiovascular;  Laterality: Left;  common iliac   SHOULDER ARTHROSCOPY WITH DISTAL CLAVICLE RESECTION Right 05/13/2021   Procedure: ARTHROSCOPY RIGHT SHOULDER WITH DISTAL CLAVICLE EXCISION;  Surgeon: Vickki Hearing, MD;  Location: [facility];  Service: Orthopedics;  Laterality: Right;   SHOULDER OPEN ROTATOR CUFF REPAIR Right 09/19/2018   Procedure: ROTATOR CUFF REPAIR SHOULDER OPEN;  Surgeon: Vickki Hearing, MD;  Location: [facility];  Service: Orthopedics;  Laterality: Right;   SHOULDER OPEN ROTATOR CUFF REPAIR Right 08/05/2019   Procedure: ROTATOR CUFF REPAIR SHOULDER OPEN;  Surgeon: Vickki Hearing, MD;  Location: [facility];  Service: Orthopedics;  Laterality: Right;   SHOULDER OPEN ROTATOR CUFF REPAIR Right 05/13/2021   Procedure: OPEN ROTATOR CUFF REPAIR RIGHT SHOULDER;  Surgeon: Vickki Hearing, MD;  Location: [facility];  Service: Orthopedics;  Laterality: Right;   TUBAL LIGATION      There were  no vitals filed for this visit.   Subjective Assessment - 06/30/21 0953     Subjective  S: I had to wait until October to have the surgery because of my medication.    Pertinent History Pt is a 45 y/o female S/P right RTC cuff repair which was completed on 05/13/21    Patient Stated Goals To be able to use her right arm again.    Currently in Pain? Yes    Pain Score 5     Pain Location Shoulder    Pain Orientation Right    Pain Descriptors / Indicators Constant;Aching;Shooting    Pain Type Surgical pain    Pain Onset More than a month ago    Pain Frequency Constant    Aggravating Factors  Movement    Pain Relieving Factors  pain medication, ice    Effect of Pain on Daily Activities Pt patient is unable to utilize her RUE for any daily tasks.               [facility] OT Assessment - 06/30/21 0954       Assessment   Medical Diagnosis Right RTC repair    Referring Provider (OT) Fuller Canada, MD    Onset Date/Surgical Date 05/13/21    Hand Dominance Right    Next MD Visit 07/07/21    Prior Therapy Pt received OP OT services for RTC pain on the right shoulder twice prior to surgery.      Precautions   Precautions Shoulder    Type of Shoulder Precautions Standard RCR protocol   Sling on 4-6 weeks   0-6 weeks: PROM, pendulum   6-12 weeks: AAROM progressing to AROM   12-24 weeks: Strengthening exercises    Shoulder Interventions Shoulder sling/immobilizer;Shoulder abduction pillow;Off for dressing/bathing/exercises      Restrictions   Weight Bearing Restrictions Yes    RUE Weight Bearing Non weight bearing      Balance Screen   Has the patient fallen in the past 6 months Yes    How many times? 1    Has the patient had a decrease in activity level because of a fear of falling?  No    Is the patient reluctant to leave their home because of a fear of falling?  No      Home  Environment   Family/patient expects to be discharged to: Private residence      Prior Function   Level of Independence Independent    Vocation Full time employment    Vocation Requirements [employer]      ADL   ADL comments Patient is unable to utilize her RUE for any daily tasks.      Mobility   Mobility Status Independent      Written Expression   Dominant Hand Right      Vision - History   Baseline Vision Wears glasses all the time      Cognition   Overall Cognitive Status Within Functional Limits for tasks assessed      Observation/Other Assessments   Focus on Therapeutic Outcomes (FOTO)  34/100      Posture/Postural Control   Posture/Postural Control Postural limitations    Postural Limitations Rounded  Shoulders;Forward head      ROM / Strength   AROM / PROM / Strength AROM;Strength;PROM      Palpation   Palpation comment Moderate fascial restrictions noted in the right upper arm and upper trapezius/lateral cervical region. Increased tenderness around healed incision and  shoulder AC joint.      AROM   Overall AROM  Unable to assess;Due to precautions      PROM   Overall PROM Comments Assessed supine. IR/er adducted.    PROM Assessment Site Shoulder    Right/Left Shoulder Right    Right Shoulder Flexion 93 Degrees    Right Shoulder ABduction 118 Degrees    Right Shoulder Internal Rotation 90 Degrees    Right Shoulder External Rotation 60 Degrees      Strength   Overall Strength Unable to assess;Due to precautions                              OT Education - 06/30/21 1145     Education Details table slides, pendulums, A/ROM elbow, wrist, forearm. Scar massage with use of vitamin E oil. Arm positioning when seated and out of sling.    Person(s) Educated Patient    Methods Explanation;Demonstration;Handout;Verbal cues    Comprehension Returned demonstration;Verbalized understanding              OT Short Term Goals - 06/30/21 1211       OT SHORT TERM GOAL #1   Title Patient will be educated and independent with HEP to faciliate progress in therapy and allow her to return to using her RUE as her dominant extremity for all daily and work activities.     Time 6    Period Weeks    Status New    Target Date 08/11/21      OT SHORT TERM GOAL #2   Title Patient will increase RUE P/ROM to [facility] in order to complete upper body dressing tasks with less difficulty.    Time 6    Period Weeks    Status New      OT SHORT TERM GOAL #3   Title Pt will increase RUE strength to 3/5 in order to complete waist level tasks with less difficulty.    Time 6    Period Weeks    Status New      OT SHORT TERM GOAL #4   Title Patient will report a decrease in pain level  of 5/10 when completing daily tasks with her RUE.    Time 6    Period Weeks    Status New      OT SHORT TERM GOAL #5   Title Patient will demonstrate fascial restrictions of min amount or less in her RUE in order to increase functional mobility needed to complete reaching tasks.    Time 6    Period Weeks               OT Long Term Goals - 06/30/21 1212       OT LONG TERM GOAL #1   Title Patient will return to highest level of independence while returning to work using her RUE for 75% or more of daily tasks.    Time 12    Period Weeks    Status New    Target Date 09/22/21      OT LONG TERM GOAL #2   Title Patient will increase A/ROM to [facility] in order to increase ability to reach above shoulder level and into cabinets with less difficulty.    Time 12    Period Weeks    Status New      OT LONG TERM GOAL #3   Title Patient will increase RUE strength to 44/5 in order  to lift normal houshold items of moderate weight.    Time 12    Period Weeks    Status New      OT LONG TERM GOAL #4   Title Patient will report a decrease in pain of approximately 3/10 or less when using her RUE for daily tasks.    Time 12    Period Weeks    Status New                   Plan - 06/30/21 1157     Clinical Impression Statement A: Patient is a 45 y/o female S/P right RTC repair causing increased pain, fascial retrictions and decreased ROM and strength resulting in severe difficulty utilizing her RUE as her dominant extremity to complete ADL tasks and work tasks.    OT Occupational Profile and History Problem Focused Assessment - Including review of records relating to presenting problem    Occupational performance deficits (Please refer to evaluation for details): ADL's;IADL's;Work;Leisure    Body Structure / Function / Physical Skills ADL;UE functional use;Pain;Fascial restriction;ROM;Strength;Mobility;IADL    Rehab Potential Excellent    Clinical Decision Making Several treatment  options, min-mod task modification necessary    Comorbidities Affecting Occupational Performance: Presence of comorbidities impacting occupational performance    Comorbidities impacting occupational performance description: see medical chart    Modification or Assistance to Complete Evaluation  Min-Moderate modification of tasks or assist with assess necessary to complete eval    OT Frequency 2x / week    OT Duration 12 weeks    OT Treatment/Interventions Self-care/ADL training;Ultrasound;Patient/family education;Passive range of motion;Cryotherapy;Electrical Stimulation;Moist Heat;Therapeutic exercise;Manual Therapy;Therapeutic activities;Neuromuscular education    Plan P: Patient will benefit from skilled OT services to increase ability to return to using her RUE as her dominant extremity for ADL tasks. Treatment Plan: Follow protocol with Day 1 the day of evaluation. Myofascial release, manaul stretching, P/ROM, AA/ROM, A/ROM, general strengthening. Modalities PRN.    OT Home Exercise Plan eval: table slides, A/ROM wrist, forearm, elbow, pendulums    Consulted and Agree with Plan of Care Patient             Patient will benefit from skilled therapeutic intervention in order to improve the following deficits and impairments:   Body Structure / Function / Physical Skills: ADL, UE functional use, Pain, Fascial restriction, ROM, Strength, Mobility, IADL       Visit Diagnosis: Acute pain of right shoulder - Plan: Ot plan of care cert/re-cert  Stiffness of right shoulder, not elsewhere classified - Plan: Ot plan of care cert/re-cert    Problem List Patient Active Problem List   Diagnosis Date Noted   Arthrosis of right acromioclavicular joint    PAD (peripheral artery disease) (HCC) 07/13/2020   S/P rotator cuff surgery 10/04/2018   Complete tear of right rotator cuff    Chronic pain of left knee    Left shoulder pain 09/21/2015   Disorders of bursae and tendons in shoulder  region, unspecified 09/25/2013   Undra Trembath, OTR/L,CBIS  [phone]  06/30/2021, 12:15 PM  [facility] [address] [place], [place], [address] Phone: [phone]   Fax:  [phone]  Name: Tina Pham MRN: [id number] Date of Birth: 09/15/1975

## 2021-06-30 NOTE — Patient Instructions (Signed)
TOWEL SLIDES COMPLETE FOR 1-3 MINUTES, 3-5 TIMES PER DAY  SHOULDER: Flexion On Table   Place hands on table, elbows straight. Move hips away from body. Press hands down into table. Hold ___ seconds. ___ reps per set, ___ sets per day, ___ days per week  Abduction (Passive)   With arm out to side, palm DOWN resting on table, lower head toward arm, keeping trunk away from table. Hold ____ seconds. Repeat ____ times. Do ____ sessions per day.  Copyright  VHI. All rights reserved.     Internal Rotation (Assistive)   Seated with elbow bent at right angle and held against side, slide arm on table surface in an inward arc. Repeat ____ times. Do ____ sessions per day. Activity: Use this motion to brush crumbs off the table.  Copyright  VHI. All rights reserved.    COMPLETE PENDULUM EXERCISES FOR 30 SECONDS TO A MINUTE EACH, 3-5 TIMES PER DAY. ROM: Pendulum (Side-to-Side)    http://orth.exer.us/792   Copyright  VHI. All rights reserved.  Pendulum Forward/Back   Bend forward 90 at waist, using table for support. Rock body forward and back to swing arm.    AROM: Wrist Extension   With right palm down, bend wrist up. Repeat 10____ times per set. Do ____ sets per session. Do __3__ sessions per day.  Copyright  VHI. All rights reserved.   AROM: Wrist Flexion   With right palm up, bend wrist up. Repeat ___10_ times per set. Do ____ sets per session. Do __3__ sessions per day.  Copyright  VHI. All rights reserved.   AROM: Forearm Pronation / Supination   With right arm in handshake position, slowly rotate palm down until stretch is felt. Relax. Then rotate palm up until stretch is felt. Repeat __10__ times per set. Do ____ sets per session. Do __3__ sessions per day.  ELBOW FLEXION EXTENSION  Start with your arm at your side. Bend at your elbow to raise your forearm/hand upwards as shown. Then return to starting position and repeat. Complete 10 times.

## 2021-07-04 ENCOUNTER — Other Ambulatory Visit: Payer: Self-pay

## 2021-07-04 ENCOUNTER — Ambulatory Visit (HOSPITAL_COMMUNITY): Payer: BC Managed Care – PPO

## 2021-07-04 ENCOUNTER — Encounter (HOSPITAL_COMMUNITY): Payer: Self-pay

## 2021-07-04 DIAGNOSIS — M25511 Pain in right shoulder: Secondary | ICD-10-CM

## 2021-07-04 DIAGNOSIS — R29898 Other symptoms and signs involving the musculoskeletal system: Secondary | ICD-10-CM

## 2021-07-04 DIAGNOSIS — M25611 Stiffness of right shoulder, not elsewhere classified: Secondary | ICD-10-CM

## 2021-07-04 NOTE — Therapy (Addendum)
[facility] [address] [place], [place], [address] Phone: [phone]   Fax:  [phone]  Occupational Therapy Treatment  Patient Details  Name: Tina Pham MRN: [id number] Date of Birth: [birth year] Referring Provider (OT): Fuller Canada, MD   Encounter Date: 07/04/2021   OT End of Session - 07/04/21 1629     Visit Number 2    Number of Visits 24    Date for OT Re-Evaluation 09/22/21   mini reassess:07/28/21   Authorization Type [employer] Commercial    Authorization Time Period 30 visit limit. 0 used.    Authorization - Visit Number 2    Authorization - Number of Visits 30    OT Start Time 1300    OT Stop Time 1338    OT Time Calculation (min) 38 min    Activity Tolerance Patient tolerated treatment well    Behavior During Therapy WFL for tasks assessed/performed             Past Medical History:  Diagnosis Date   Anxiety    panic attacks   Arthritis    Chronic back pain    Chronic bronchitis (HCC)    Depression    Diabetes mellitus    GERD (gastroesophageal reflux disease)    prn tums   High cholesterol    History of kidney stones    passed ?   Hypertension    no longer on BP medications   Migraine    Neuropathic pain of foot    Plantar fasciitis    Pneumonia 2011ish   Sciatica    Torn rotator cuff     Past Surgical History:  Procedure Laterality Date   ABDOMINAL AORTOGRAM W/LOWER EXTREMITY N/A 07/01/2020   Procedure: ABDOMINAL AORTOGRAM W/LOWER EXTREMITY;  Surgeon: Cephus Shelling, MD;  Location: [facility];  Service: Cardiovascular;  Laterality: N/A;   AMPUTATION Left 07/22/2020   Procedure: LEFT GREAT TOE AMPUTATION;  Surgeon: Maeola Harman, MD;  Location: [facility] OR;  Service: Vascular;  Laterality: Left;   BACK SURGERY     herniated disc; had disc removed and then fusion; total of 3 surgeries   EYE SURGERY Bilateral    removal of cyst and straightening of muscles   KNEE  ARTHROSCOPY WITH MEDIAL MENISECTOMY Left 12/27/2016   Procedure: LEFT KNEE DIAGNOSTIC ARTHROSCOPY;  Surgeon: Vickki Hearing, MD;  Location: [facility];  Service: Orthopedics;  Laterality: Left;   PERIPHERAL VASCULAR INTERVENTION Left 07/01/2020   Procedure: PERIPHERAL VASCULAR INTERVENTION;  Surgeon: Cephus Shelling, MD;  Location: [facility];  Service: Cardiovascular;  Laterality: Left;  common iliac   SHOULDER ARTHROSCOPY WITH DISTAL CLAVICLE RESECTION Right 05/13/2021   Procedure: ARTHROSCOPY RIGHT SHOULDER WITH DISTAL CLAVICLE EXCISION;  Surgeon: Vickki Hearing, MD;  Location: [facility];  Service: Orthopedics;  Laterality: Right;   SHOULDER OPEN ROTATOR CUFF REPAIR Right 09/19/2018   Procedure: ROTATOR CUFF REPAIR SHOULDER OPEN;  Surgeon: Vickki Hearing, MD;  Location: [facility];  Service: Orthopedics;  Laterality: Right;   SHOULDER OPEN ROTATOR CUFF REPAIR Right 08/05/2019   Procedure: ROTATOR CUFF REPAIR SHOULDER OPEN;  Surgeon: Vickki Hearing, MD;  Location: [facility];  Service: Orthopedics;  Laterality: Right;   SHOULDER OPEN ROTATOR CUFF REPAIR Right 05/13/2021   Procedure: OPEN ROTATOR CUFF REPAIR RIGHT SHOULDER;  Surgeon: Vickki Hearing, MD;  Location: [facility];  Service: Orthopedics;  Laterality: Right;   TUBAL LIGATION      There were  no vitals filed for this visit.   Subjective Assessment - 07/04/21 1323     Subjective  S: I took my pain medication but I don't think it's kicked in yet.    Currently in Pain? Yes    Pain Score 7     Pain Location Shoulder    Pain Orientation Right    Pain Descriptors / Indicators Constant;Aching    Pain Type Surgical pain    Pain Onset More than a month ago    Pain Frequency Constant    Aggravating Factors  movement    Pain Relieving Factors pain medication, ice    Effect of Pain on Daily Activities Pt is unable to utilize her RUE for any daily tasks.                [facility] OT Assessment - 07/04/21 1344        Assessment   Medical Diagnosis Right RTC repair      Precautions   Precautions Shoulder    Type of Shoulder Precautions Standard RCR protocol   Sling on 4-6 weeks   0-6 weeks: PROM, pendulum   6-12 weeks: AAROM progressing to AROM   12-24 weeks: Strengthening exercises    Shoulder Interventions Shoulder sling/immobilizer;Shoulder abduction pillow;Off for dressing/bathing/exercises                      OT Treatments/Exercises (OP) - 07/04/21 1326       Exercises   Exercises Shoulder;Elbow      Shoulder Exercises: Supine   Protraction PROM;5 reps    Horizontal ABduction PROM;5 reps    External Rotation PROM;5 reps    Internal Rotation PROM;5 reps    Flexion PROM;5 reps    ABduction PROM;5 reps      Shoulder Exercises: Seated   Extension AROM;10 reps    Row AROM;10 reps    Other Seated Exercises A/ROM scapular depression; 10X      Shoulder Exercises: Therapy Ball   Flexion 10 reps   2" hold at end stretch   ABduction 10 reps   2" hold at end stretch     Shoulder Exercises: Isometric Strengthening   Flexion 3X5"   standing   Extension 3X5"   standing   External Rotation 3X5"   standing   Internal Rotation 3X5"   standing     Elbow Exercises   Other elbow exercises Seated, A/ROM, elbow flexion/extension, 10X                      OT Short Term Goals - 07/04/21 1631       OT SHORT TERM GOAL #1   Title Patient will be educated and independent with HEP to faciliate progress in therapy and allow her to return to using her RUE as her dominant extremity for all daily and work activities.     Time 6    Period Weeks    Status On-going    Target Date 08/11/21      OT SHORT TERM GOAL #2   Title Patient will increase RUE P/ROM to [facility] in order to complete upper body dressing tasks with less difficulty.    Time 6    Period Weeks    Status On-going      OT SHORT TERM GOAL #3   Title Pt will increase RUE strength to 3/5 in order to complete waist level  tasks with less difficulty.    Time 6  Period Weeks    Status On-going      OT SHORT TERM GOAL #4   Title Patient will report a decrease in pain level of 5/10 when completing daily tasks with her RUE.    Time 6    Period Weeks    Status On-going      OT SHORT TERM GOAL #5   Title Patient will demonstrate fascial restrictions of min amount or less in her RUE in order to increase functional mobility needed to complete reaching tasks.    Time 6    Period Weeks    Status On-going               OT Long Term Goals - 07/04/21 1631       OT LONG TERM GOAL #1   Title Patient will return to highest level of independence while returning to work using her RUE for 75% or more of daily tasks.    Time 12    Period Weeks    Status On-going    Target Date 09/22/21      OT LONG TERM GOAL #2   Title Patient will increase A/ROM to [facility] in order to increase ability to reach above shoulder level and into cabinets with less difficulty.    Time 12    Period Weeks    Status On-going      OT LONG TERM GOAL #3   Title Patient will increase RUE strength to 44/5 in order to lift normal houshold items of moderate weight.    Time 12    Period Weeks    Status On-going      OT LONG TERM GOAL #4   Title Patient will report a decrease in pain of approximately 3/10 or less when using her RUE for daily tasks.    Time 12    Period Weeks    Status On-going                   Plan - 07/04/21 1629     Clinical Impression Statement A:Initiated myofascial release to address moderate fascial restrictions located at the upper trapezius region. Increased muscle guarding present during passive ROM. Able to achieve approximately 50% passive ROM shoulder flexion and abduction. VC for form and technique provided during session as well as cues to decrease muscle guarding.    Body Structure / Function / Physical Skills ADL;UE functional use;Pain;Fascial restriction;ROM;Strength;Mobility;IADL    Plan  P: Complete all isometrics for shoulder. Decrease muscle guarding by increasing tolerance with P/ROM.    Consulted and Agree with Plan of Care Patient             Patient will benefit from skilled therapeutic intervention in order to improve the following deficits and impairments:   Body Structure / Function / Physical Skills: ADL, UE functional use, Pain, Fascial restriction, ROM, Strength, Mobility, IADL       Visit Diagnosis: Other symptoms and signs involving the musculoskeletal system  Acute pain of right shoulder  Stiffness of right shoulder, not elsewhere classified    Problem List Patient Active Problem List   Diagnosis Date Noted   Arthrosis of right acromioclavicular joint    PAD (peripheral artery disease) (HCC) 07/13/2020   S/P rotator cuff surgery 10/04/2018   Complete tear of right rotator cuff    Chronic pain of left knee    Left shoulder pain 09/21/2015   Disorders of bursae and tendons in shoulder region, unspecified 09/25/2013    Limmie Patricia,  OTR/L,CBIS  [phone]  07/04/2021, 4:32 PM   [facility] [address] [place], [place], [address] Phone: [phone]   Fax:  [phone]  Name: Tina Pham MRN: [id number] Date of Birth: 1975-09-07

## 2021-07-06 ENCOUNTER — Other Ambulatory Visit: Payer: Self-pay | Admitting: Orthopedic Surgery

## 2021-07-07 ENCOUNTER — Encounter: Payer: Self-pay | Admitting: Orthopedic Surgery

## 2021-07-07 ENCOUNTER — Ambulatory Visit (INDEPENDENT_AMBULATORY_CARE_PROVIDER_SITE_OTHER): Payer: BC Managed Care – PPO | Admitting: Orthopedic Surgery

## 2021-07-07 ENCOUNTER — Other Ambulatory Visit: Payer: Self-pay

## 2021-07-07 ENCOUNTER — Ambulatory Visit (HOSPITAL_COMMUNITY): Payer: BC Managed Care – PPO

## 2021-07-07 DIAGNOSIS — M25511 Pain in right shoulder: Secondary | ICD-10-CM

## 2021-07-07 DIAGNOSIS — M25611 Stiffness of right shoulder, not elsewhere classified: Secondary | ICD-10-CM

## 2021-07-07 DIAGNOSIS — Z9889 Other specified postprocedural states: Secondary | ICD-10-CM

## 2021-07-07 DIAGNOSIS — R29898 Other symptoms and signs involving the musculoskeletal system: Secondary | ICD-10-CM

## 2021-07-07 MED ORDER — TIZANIDINE HCL 4 MG PO TABS: 4.0000 mg | ORAL_TABLET | Freq: Three times a day (TID) | ORAL | 0 refills | Status: DC

## 2021-07-07 MED ORDER — OXYCODONE-ACETAMINOPHEN 5-325 MG PO TABS: 1.0000 | ORAL_TABLET | Freq: Four times a day (QID) | ORAL | 0 refills | Status: DC | PRN

## 2021-07-07 NOTE — Progress Notes (Signed)
Chief Complaint  Patient presents with   Post-op Follow-up    05/13/21 repeat right RCR distal clavicle excision/ has knot at incision needs refill pain meds    Tina Pham had her second rotator cuff revision after traumatic retear of the right rotator cuff on May 13, 2021 she is 8 weeks out of her postop day 55  She wanted a skin area checked and which I did.  She has 2 incisions from the open techniques and there is some scarring in the subcu tissue at the apex of the 2 incisions otherwise her wound looks fine there is no erythema  Unfortunately Patrice cannot take any anti-inflammatories because of her Plavix and aspirin therapy  So she is being maintained on oxycodone and she has run out of her muscle relaxer  She has been able to tolerate 3 physical therapy sessions and I would like to see her again in 4 weeks  She can lose the abduction pillow and use the sling for comfort  She should avoid any heavy lifting or use of the arm other than routine activities of daily living   Meds ordered this encounter  Medications   oxyCODONE-acetaminophen (PERCOCET/ROXICET) 5-325 MG tablet    Sig: Take 1 tablet by mouth every 6 (six) hours as needed for up to 5 days for severe pain.    Dispense:  20 tablet    Refill:  0   tiZANidine (ZANAFLEX) 4 MG tablet    Sig: Take 1 tablet (4 mg total) by mouth 3 (three) times daily for 28 days.    Dispense:  84 tablet    Refill:  0

## 2021-07-07 NOTE — Therapy (Addendum)
[facility], [place], [address] Phone: [phone]   Fax:  [phone]  Occupational Therapy Treatment  Patient Details  Name: Tina Pham MRN: [id number] Date of Birth: 12-12-1975 Referring Provider (OT): Arther Abbott, MD   Encounter Date: 07/07/2021   OT End of Session - 07/07/21 0857     Visit Number 3    Number of Visits 24    Date for OT Re-Evaluation 09/22/21   mini reassess:07/28/21   Authorization Type BC[employer] Commercial    Authorization Time Period 30 visit limit. 0 used.    Authorization - Visit Number 3    Authorization - Number of Visits 30    OT Start Time [phone]    OT Stop Time [phone]    OT Time Calculation (min) 34 min    Activity Tolerance Patient tolerated treatment well    Behavior During Therapy WFL for tasks assessed/performed             Past Medical History:  Diagnosis Date   Anxiety    panic attacks   Arthritis    Chronic back pain    Chronic bronchitis (HCC)    Depression    Diabetes mellitus    GERD (gastroesophageal reflux disease)    prn tums   High cholesterol    History of kidney stones    passed ?   Hypertension    no longer on BP medications   Migraine    Neuropathic pain of foot    Plantar fasciitis    Pneumonia 2011ish   Sciatica    Torn rotator cuff     Past Surgical History:  Procedure Laterality Date   ABDOMINAL AORTOGRAM W/LOWER EXTREMITY N/A 07/01/2020   Procedure: ABDOMINAL AORTOGRAM W/LOWER EXTREMITY;  Surgeon: Marty Heck, MD;  Location: [place] [facility];  Service: Cardiovascular;  Laterality: N/A;   AMPUTATION Left 07/22/2020   Procedure: LEFT GREAT TOE AMPUTATION;  Surgeon: Waynetta Sandy, MD;  Location: [place];  Service: Vascular;  Laterality: Left;   BACK SURGERY     herniated disc; had disc removed and then fusion; total of 3 surgeries   EYE SURGERY Bilateral    removal of cyst and straightening of muscles   KNEE  ARTHROSCOPY WITH MEDIAL MENISECTOMY Left 12/27/2016   Procedure: LEFT KNEE DIAGNOSTIC ARTHROSCOPY;  Surgeon: Carole Civil, MD;  Location: [facility];  Service: Orthopedics;  Laterality: Left;   PERIPHERAL VASCULAR INTERVENTION Left 07/01/2020   Procedure: PERIPHERAL VASCULAR INTERVENTION;  Surgeon: Marty Heck, MD;  Location: [facility];  Service: Cardiovascular;  Laterality: Left;  common iliac   SHOULDER ARTHROSCOPY WITH DISTAL CLAVICLE RESECTION Right 05/13/2021   Procedure: ARTHROSCOPY RIGHT SHOULDER WITH DISTAL CLAVICLE EXCISION;  Surgeon: Carole Civil, MD;  Location: [facility];  Service: Orthopedics;  Laterality: Right;   SHOULDER OPEN ROTATOR CUFF REPAIR Right 09/19/2018   Procedure: ROTATOR CUFF REPAIR SHOULDER OPEN;  Surgeon: Carole Civil, MD;  Location: [facility];  Service: Orthopedics;  Laterality: Right;   SHOULDER OPEN ROTATOR CUFF REPAIR Right 08/05/2019   Procedure: ROTATOR CUFF REPAIR SHOULDER OPEN;  Surgeon: Carole Civil, MD;  Location: [facility];  Service: Orthopedics;  Laterality: Right;   SHOULDER OPEN ROTATOR CUFF REPAIR Right 05/13/2021   Procedure: OPEN ROTATOR CUFF REPAIR RIGHT SHOULDER;  Surgeon: Carole Civil, MD;  Location: [facility];  Service: Orthopedics;  Laterality: Right;   TUBAL LIGATION      There were  no vitals filed for this visit.   Subjective Assessment - 07/07/21 0833     Subjective  S: I'm hoping my pain medication kicks in soon.    Currently in Pain? Yes    Pain Score 7     Pain Location Shoulder    Pain Orientation Right    Pain Descriptors / Indicators Constant;Aching    Pain Type Surgical pain    Pain Onset More than a month ago    Pain Frequency Constant    Aggravating Factors  movement, maybe today's weather    Pain Relieving Factors pain medication, ice    Effect of Pain on Daily Activities Pt is unable to utilize her RUE for any daily tasks.                [facility] OT Assessment - 07/07/21 0834        Assessment   Medical Diagnosis Right RTC repair      Precautions   Precautions Shoulder    Type of Shoulder Precautions Standard RCR protocol   Sling on 4-6 weeks   0-6 weeks: PROM, pendulum   6-12 weeks: AAROM progressing to AROM   12-24 weeks: Strengthening exercises    Shoulder Interventions Shoulder sling/immobilizer;Shoulder abduction pillow;Off for dressing/bathing/exercises                      OT Treatments/Exercises (OP) - 07/07/21 0834       Exercises   Exercises Shoulder;Elbow      Shoulder Exercises: Supine   Protraction PROM;5 reps    Horizontal ABduction PROM;5 reps    External Rotation PROM;5 reps    Internal Rotation PROM;5 reps    Flexion PROM;5 reps    ABduction PROM;5 reps      Shoulder Exercises: Seated   Extension AROM;10 reps    Row AROM;10 reps    Other Seated Exercises A/ROM scapular depression; 10X      Shoulder Exercises: Therapy Ball   Flexion 10 reps   2" hold at end stretch   ABduction 10 reps   2" hold at end stretch     Shoulder Exercises: ROM/Strengthening   Thumb Tacks 1' low level - back of chair    Prot/Ret//Elev/Dep 1'      Shoulder Exercises: Isometric Strengthening   Flexion Supine;3X5"    Extension Supine;3X5"    External Rotation Supine;3X5"    Internal Rotation Supine;3X5"    ABduction Supine;3X5"    ADduction Supine;3X5"      Elbow Exercises   Other elbow exercises Seated, A/ROM, elbow flexion/extension, 12X      Manual Therapy   Manual Therapy Myofascial release    Manual therapy comments completed separately from therapeutic exercises    Myofascial Release Myofascial release and manual stretching completed to the right upper arm, upper trapezius, and scapularis region to decrease fascial restrictions and increase joint mobility in a pain free zone.                      OT Short Term Goals - 07/04/21 1631       OT SHORT TERM GOAL #1   Title Patient will be educated and independent with HEP to  faciliate progress in therapy and allow her to return to using her RUE as her dominant extremity for all daily and work activities.     Time 6    Period Weeks    Status On-going    Target Date 08/11/21  OT SHORT TERM GOAL #2   Title Patient will increase RUE P/ROM to [facility] in order to complete upper body dressing tasks with less difficulty.    Time 6    Period Weeks    Status On-going      OT SHORT TERM GOAL #3   Title Pt will increase RUE strength to 3/5 in order to complete waist level tasks with less difficulty.    Time 6    Period Weeks    Status On-going      OT SHORT TERM GOAL #4   Title Patient will report a decrease in pain level of 5/10 when completing daily tasks with her RUE.    Time 6    Period Weeks    Status On-going      OT SHORT TERM GOAL #5   Title Patient will demonstrate fascial restrictions of min amount or less in her RUE in order to increase functional mobility needed to complete reaching tasks.    Time 6    Period Weeks    Status On-going               OT Long Term Goals - 07/04/21 1631       OT LONG TERM GOAL #1   Title Patient will return to highest level of independence while returning to work using her RUE for 75% or more of daily tasks.    Time 12    Period Weeks    Status On-going    Target Date 09/22/21      OT LONG TERM GOAL #2   Title Patient will increase A/ROM to [facility] in order to increase ability to reach above shoulder level and into cabinets with less difficulty.    Time 12    Period Weeks    Status On-going      OT LONG TERM GOAL #3   Title Patient will increase RUE strength to 44/5 in order to lift normal houshold items of moderate weight.    Time 12    Period Weeks    Status On-going      OT LONG TERM GOAL #4   Title Patient will report a decrease in pain of approximately 3/10 or less when using her RUE for daily tasks.    Time 12    Period Weeks    Status On-going                   Plan - 07/07/21  0857     Clinical Impression Statement A: Conitnued with myofascial release to address minimal fascial restrictions in the upper trapezius region. Added thumbs tasks and pro/ret/elev/dep. Pain voiced during session periodically. Monitored pain level and adjusted exercises as needed. VC for form and technique were provided.    Body Structure / Function / Physical Skills ADL;UE functional use;Pain;Fascial restriction;ROM;Strength;Mobility;IADL    Plan P: Continue with P/ROm gently progress ROM as patient is able to tolerate. Slightly bent elbow during P/ROM to help decrease muscle guarding. Follow up on MD appointment.    Consulted and Agree with Plan of Care Patient             Patient will benefit from skilled therapeutic intervention in order to improve the following deficits and impairments:   Body Structure / Function / Physical Skills: ADL, UE functional use, Pain, Fascial restriction, ROM, Strength, Mobility, IADL       Visit Diagnosis: Acute pain of right shoulder  Other symptoms and signs involving the musculoskeletal system  Stiffness of right shoulder, not elsewhere classified    Problem List Patient Active Problem List   Diagnosis Date Noted   Arthrosis of right acromioclavicular joint    PAD (peripheral artery disease) ([place]) 07/13/2020   S/P rotator cuff surgery 10/04/2018   Complete tear of right rotator cuff    Chronic pain of left knee    Left shoulder pain 09/21/2015   Disorders of bursae and tendons in shoulder region, unspecified 09/25/2013   Lacreasha Hinds, OTR/L,CBIS  [phone]  07/07/2021, 9:00 AM  [place], [place], [address] Phone: [phone]   Fax:  [phone]  Name: Tina Pham MRN: [id number] Date of Birth: 1976/06/11

## 2021-07-11 ENCOUNTER — Other Ambulatory Visit: Payer: Self-pay | Admitting: Orthopedic Surgery

## 2021-07-11 DIAGNOSIS — Z9889 Other specified postprocedural states: Secondary | ICD-10-CM

## 2021-07-11 MED ORDER — OXYCODONE-ACETAMINOPHEN 5-325 MG PO TABS: 1.0000 | ORAL_TABLET | Freq: Four times a day (QID) | ORAL | 0 refills | Status: DC | PRN

## 2021-07-12 ENCOUNTER — Encounter (HOSPITAL_COMMUNITY): Payer: Self-pay

## 2021-07-12 ENCOUNTER — Ambulatory Visit (HOSPITAL_COMMUNITY): Payer: BC Managed Care – PPO

## 2021-07-12 ENCOUNTER — Other Ambulatory Visit: Payer: Self-pay

## 2021-07-12 DIAGNOSIS — M25511 Pain in right shoulder: Secondary | ICD-10-CM

## 2021-07-12 DIAGNOSIS — M25611 Stiffness of right shoulder, not elsewhere classified: Secondary | ICD-10-CM

## 2021-07-12 DIAGNOSIS — R29898 Other symptoms and signs involving the musculoskeletal system: Secondary | ICD-10-CM

## 2021-07-12 NOTE — Therapy (Addendum)
[facility] [address] [place], [place], [address] Phone: [phone]   Fax:  [phone]  Occupational Therapy Treatment  Patient Details  Name: Tina Pham MRN: [id number] Date of Birth: Jan 08, 1976 Referring Provider (OT): Fuller Canada, MD   Encounter Date: 07/12/2021   OT End of Session - 07/12/21 1319     Visit Number 4    Number of Visits 24    Date for OT Re-Evaluation 09/22/21   mini reassess:07/28/21   Authorization Type [employer] Commercial    Authorization Time Period 30 visit limit. 0 used.    Authorization - Visit Number 4    Authorization - Number of Visits 30    OT Start Time 1120    OT Stop Time 1155    OT Time Calculation (min) 35 min    Activity Tolerance Patient tolerated treatment well    Behavior During Therapy WFL for tasks assessed/performed             Past Medical History:  Diagnosis Date   Anxiety    panic attacks   Arthritis    Chronic back pain    Chronic bronchitis (HCC)    Depression    Diabetes mellitus    GERD (gastroesophageal reflux disease)    prn tums   High cholesterol    History of kidney stones    passed ?   Hypertension    no longer on BP medications   Migraine    Neuropathic pain of foot    Plantar fasciitis    Pneumonia 2011ish   Sciatica    Torn rotator cuff     Past Surgical History:  Procedure Laterality Date   ABDOMINAL AORTOGRAM W/LOWER EXTREMITY N/A 07/01/2020   Procedure: ABDOMINAL AORTOGRAM W/LOWER EXTREMITY;  Surgeon: Cephus Shelling, MD;  [facility];  Service: Cardiovascular;  Laterality: N/A;   AMPUTATION Left 07/22/2020   Procedure: LEFT GREAT TOE AMPUTATION;  Surgeon: Maeola Harman, MD;  Location: [facility] OR;  Service: Vascular;  Laterality: Left;   BACK SURGERY     herniated disc; had disc removed and then fusion; total of 3 surgeries   EYE SURGERY Bilateral    removal of cyst and straightening of muscles   KNEE  ARTHROSCOPY WITH MEDIAL MENISECTOMY Left 12/27/2016   Procedure: LEFT KNEE DIAGNOSTIC ARTHROSCOPY;  Surgeon: Vickki Hearing, MD;  Location: [facility];  Service: Orthopedics;  Laterality: Left;   PERIPHERAL VASCULAR INTERVENTION Left 07/01/2020   Procedure: PERIPHERAL VASCULAR INTERVENTION;  Surgeon: Cephus Shelling, MD;  [facility];  Service: Cardiovascular;  Laterality: Left;  common iliac   SHOULDER ARTHROSCOPY WITH DISTAL CLAVICLE RESECTION Right 05/13/2021   Procedure: ARTHROSCOPY RIGHT SHOULDER WITH DISTAL CLAVICLE EXCISION;  Surgeon: Vickki Hearing, MD;  Location: [facility];  Service: Orthopedics;  Laterality: Right;   SHOULDER OPEN ROTATOR CUFF REPAIR Right 09/19/2018   Procedure: ROTATOR CUFF REPAIR SHOULDER OPEN;  Surgeon: Vickki Hearing, MD;  Location: [facility];  Service: Orthopedics;  Laterality: Right;   SHOULDER OPEN ROTATOR CUFF REPAIR Right 08/05/2019   Procedure: ROTATOR CUFF REPAIR SHOULDER OPEN;  Surgeon: Vickki Hearing, MD;  Location: [facility];  Service: Orthopedics;  Laterality: Right;   SHOULDER OPEN ROTATOR CUFF REPAIR Right 05/13/2021   Procedure: OPEN ROTATOR CUFF REPAIR RIGHT SHOULDER;  Surgeon: Vickki Hearing, MD;  Location: [facility];  Service: Orthopedics;  Laterality: Right;   TUBAL LIGATION      There were  no vitals filed for this visit.   Subjective Assessment - 07/12/21 1136     Subjective  S: I got rid of the pillow and Dr. Romeo Apple said to wear the sling for comfort. Yesterday I went all day without it and I can really tell today.    Currently in Pain? Yes    Pain Score 7     Pain Location Shoulder    Pain Orientation Right    Pain Descriptors / Indicators Constant;Aching    Pain Type Surgical pain    Pain Onset More than a month ago    Pain Frequency Constant    Aggravating Factors  movement    Pain Relieving Factors pain medication, ice    Effect of Pain on Daily Activities Pt is unable to utilize her RUE for any daily  tasks.                [facility] OT Assessment - 07/12/21 1136       Assessment   Medical Diagnosis Right RTC repair    Next MD Visit 08/04/21      Precautions   Precautions Shoulder    Type of Shoulder Precautions Standard RCR protocol   Sling on 4-6 weeks   0-6 weeks: PROM, pendulum   6-12 weeks: AAROM progressing to AROM   12-24 weeks: Strengthening exercises    Shoulder Interventions Shoulder sling/immobilizer;For comfort                      OT Treatments/Exercises (OP) - 07/12/21 1139       Exercises   Exercises Shoulder      Shoulder Exercises: Supine   Protraction PROM;5 reps;AAROM;10 reps    Horizontal ABduction PROM;5 reps;AAROM;10 reps;Limitations    Horizontal ABduction Limitations limited ROM tolerated    External Rotation PROM;5 reps    Internal Rotation PROM;5 reps    Flexion PROM;5 reps;AAROM;10 reps;Limitations    Flexion Limitations to shoulder level due to shoulder popping    ABduction PROM;5 reps    Other Supine Exercises AA/ROM serratus anterior. 10X AA/ROM with PVC pipe      Shoulder Exercises: Seated   Protraction AAROM;10 reps      Shoulder Exercises: Standing   Other Standing Exercises PVC pipe slide flexion, 10X    Other Standing Exercises Counter top wash Flexion 15X, horizontal abduction/adduction 15X, circles 10X both directions      Manual Therapy   Manual Therapy Myofascial release    Manual therapy comments completed separately from therapeutic exercises    Myofascial Release Myofascial release and manual stretching completed to the right upper arm, upper trapezius, and scapularis region to decrease fascial restrictions and increase joint mobility in a pain free zone.                    OT Education - 07/12/21 1311     Education Details AA/ROM (supine only) protraction, flexion, IR/er, horizontal adduction/abduction    Person(s) Educated Patient    Methods Explanation;Demonstration;Handout;Verbal cues     Comprehension Returned demonstration;Verbalized understanding              OT Short Term Goals - 07/04/21 1631       OT SHORT TERM GOAL #1   Title Patient will be educated and independent with HEP to faciliate progress in therapy and allow her to return to using her RUE as her dominant extremity for all daily and work activities.     Time 6  Period Weeks    Status On-going    Target Date 08/11/21      OT SHORT TERM GOAL #2   Title Patient will increase RUE P/ROM to [facility] in order to complete upper body dressing tasks with less difficulty.    Time 6    Period Weeks    Status On-going      OT SHORT TERM GOAL #3   Title Pt will increase RUE strength to 3/5 in order to complete waist level tasks with less difficulty.    Time 6    Period Weeks    Status On-going      OT SHORT TERM GOAL #4   Title Patient will report a decrease in pain level of 5/10 when completing daily tasks with her RUE.    Time 6    Period Weeks    Status On-going      OT SHORT TERM GOAL #5   Title Patient will demonstrate fascial restrictions of min amount or less in her RUE in order to increase functional mobility needed to complete reaching tasks.    Time 6    Period Weeks    Status On-going               OT Long Term Goals - 07/04/21 1631       OT LONG TERM GOAL #1   Title Patient will return to highest level of independence while returning to work using her RUE for 75% or more of daily tasks.    Time 12    Period Weeks    Status On-going    Target Date 09/22/21      OT LONG TERM GOAL #2   Title Patient will increase A/ROM to [facility] in order to increase ability to reach above shoulder level and into cabinets with less difficulty.    Time 12    Period Weeks    Status On-going      OT LONG TERM GOAL #3   Title Patient will increase RUE strength to 44/5 in order to lift normal houshold items of moderate weight.    Time 12    Period Weeks    Status On-going      OT LONG TERM GOAL #4    Title Patient will report a decrease in pain of approximately 3/10 or less when using her RUE for daily tasks.    Time 12    Period Weeks    Status On-going                   Plan - 07/12/21 1319     Clinical Impression Statement A: reports increased pain/soreness since going without the sling yesterday. She is only to wear the sling for comfort now. No lifting with the right UE. Ok to use for basic ADL tasks. Increased tenderness noted during myofascial release although minimal fascial restrictions noted. Continued with passive ROM and added some AA/ROM exercises supine. Provided for HEP. Reports catching sensation during all passive and active assisted movements. Monitored during session and adjusted as needed.    Body Structure / Function / Physical Skills ADL;UE functional use;Pain;Fascial restriction;ROM;Strength;Mobility;IADL    Plan P: Follow up on HEP. Continue to progress AA/ROM. Adde pulleys.    OT Home Exercise Plan eval: table slides, A/ROM wrist, forearm, elbow, pendulums 12/20: AA/ROM supine - flexion, IR/er, protraction, horizontal abduction/adduction    Consulted and Agree with Plan of Care Patient  Patient will benefit from skilled therapeutic intervention in order to improve the following deficits and impairments:   Body Structure / Function / Physical Skills: ADL, UE functional use, Pain, Fascial restriction, ROM, Strength, Mobility, IADL       Visit Diagnosis: Other symptoms and signs involving the musculoskeletal system  Acute pain of right shoulder  Stiffness of right shoulder, not elsewhere classified    Problem List Patient Active Problem List   Diagnosis Date Noted   Arthrosis of right acromioclavicular joint    PAD (peripheral artery disease) (HCC) 07/13/2020   S/P rotator cuff surgery 10/04/2018   Complete tear of right rotator cuff    Chronic pain of left knee    Left shoulder pain 09/21/2015   Disorders of bursae and  tendons in shoulder region, unspecified 09/25/2013    Omunique Pederson, OTR/L,CBIS  [phone]  07/12/2021, 1:23 PM  [facility] [address] [place], [place], [address] Phone: [phone]   Fax:  [phone]  Name: Tina Pham MRN: [id number] Date of Birth: 1976-05-08

## 2021-07-12 NOTE — Patient Instructions (Signed)
Perform each exercise ____10-15____ reps. 2-3x days.  *Do laying down for right now.   1) Protraction   Start by holding a wand or cane at chest height.  Next, slowly push the wand outwards in front of your body so that your elbows become fully straightened. Then, return to the original position.     2) Shoulder FLEXION   In the standing position, hold a wand/cane with both arms, palms down on both sides. Raise up the wand/cane allowing your unaffected arm to perform most of the effort. Your affected arm should be partially relaxed.      3) Internal/External ROTATION   In the standing position, hold a wand/cane with both hands keeping your elbows bent. Move your arms and wand/cane to one side.  Your affected arm should be partially relaxed while your unaffected arm performs most of the effort.           5) Horizontal Abduction/Adduction      Straight arms holding cane at shoulder height, bring cane to right, center, left. Repeat starting to left.   Copyright  VHI. All rights reserved.    Table or counter wash  Circles 10 times each direction Forward 15 times.  Side to side 15 times.

## 2021-07-14 ENCOUNTER — Other Ambulatory Visit: Payer: Self-pay | Admitting: Orthopedic Surgery

## 2021-07-14 ENCOUNTER — Encounter (HOSPITAL_COMMUNITY): Payer: Self-pay

## 2021-07-14 ENCOUNTER — Ambulatory Visit (HOSPITAL_COMMUNITY): Payer: BC Managed Care – PPO

## 2021-07-14 ENCOUNTER — Other Ambulatory Visit: Payer: Self-pay

## 2021-07-14 ENCOUNTER — Encounter: Payer: Self-pay | Admitting: Orthopedic Surgery

## 2021-07-14 DIAGNOSIS — Z9889 Other specified postprocedural states: Secondary | ICD-10-CM

## 2021-07-14 DIAGNOSIS — M25511 Pain in right shoulder: Secondary | ICD-10-CM

## 2021-07-14 DIAGNOSIS — R29898 Other symptoms and signs involving the musculoskeletal system: Secondary | ICD-10-CM

## 2021-07-14 DIAGNOSIS — M25611 Stiffness of right shoulder, not elsewhere classified: Secondary | ICD-10-CM

## 2021-07-14 MED ORDER — OXYCODONE-ACETAMINOPHEN 5-325 MG PO TABS: 1.0000 | ORAL_TABLET | Freq: Four times a day (QID) | ORAL | 0 refills | Status: DC | PRN

## 2021-07-14 NOTE — Therapy (Signed)
[facility] [address] [place], [place], [address] Phone: [phone]   Fax:  [phone]  Occupational Therapy Treatment  Patient Details  Name: Tina Pham MRN: [id number] Date of Birth: 09-16-1975 Referring Provider (OT): Fuller Canada, MD   Encounter Date: 07/14/2021   OT End of Session - 07/14/21 1111     Visit Number 5    Number of Visits 24    Date for OT Re-Evaluation 09/22/21   mini reassess:07/28/21   Authorization Type BC[employer] Commercial    Authorization Time Period 30 visit limit. 0 used.    Authorization - Visit Number 5    Authorization - Number of Visits 30    OT Start Time 1040   arrived late   OT Stop Time 1115    OT Time Calculation (min) 35 min    Activity Tolerance Patient limited by pain    Behavior During Therapy Flat affect             Past Medical History:  Diagnosis Date   Anxiety    panic attacks   Arthritis    Chronic back pain    Chronic bronchitis (HCC)    Depression    Diabetes mellitus    GERD (gastroesophageal reflux disease)    prn tums   High cholesterol    History of kidney stones    passed ?   Hypertension    no longer on BP medications   Migraine    Neuropathic pain of foot    Plantar fasciitis    Pneumonia 2011ish   Sciatica    Torn rotator cuff     Past Surgical History:  Procedure Laterality Date   ABDOMINAL AORTOGRAM W/LOWER EXTREMITY N/A 07/01/2020   Procedure: ABDOMINAL AORTOGRAM W/LOWER EXTREMITY;  Surgeon: Cephus Shelling, MD;  [facility];  Service: Cardiovascular;  Laterality: N/A;   AMPUTATION Left 07/22/2020   Procedure: LEFT GREAT TOE AMPUTATION;  Surgeon: Maeola Harman, MD;  Location: [facility] OR;  Service: Vascular;  Laterality: Left;   BACK SURGERY     herniated disc; had disc removed and then fusion; total of 3 surgeries   EYE SURGERY Bilateral    removal of cyst and straightening of muscles   KNEE ARTHROSCOPY WITH  MEDIAL MENISECTOMY Left 12/27/2016   Procedure: LEFT KNEE DIAGNOSTIC ARTHROSCOPY;  Surgeon: Vickki Hearing, MD;  Location: [facility];  Service: Orthopedics;  Laterality: Left;   PERIPHERAL VASCULAR INTERVENTION Left 07/01/2020   Procedure: PERIPHERAL VASCULAR INTERVENTION;  Surgeon: Cephus Shelling, MD;  [facility];  Service: Cardiovascular;  Laterality: Left;  common iliac   SHOULDER ARTHROSCOPY WITH DISTAL CLAVICLE RESECTION Right 05/13/2021   Procedure: ARTHROSCOPY RIGHT SHOULDER WITH DISTAL CLAVICLE EXCISION;  Surgeon: Vickki Hearing, MD;  Location: [facility];  Service: Orthopedics;  Laterality: Right;   SHOULDER OPEN ROTATOR CUFF REPAIR Right 09/19/2018   Procedure: ROTATOR CUFF REPAIR SHOULDER OPEN;  Surgeon: Vickki Hearing, MD;  Location: [facility];  Service: Orthopedics;  Laterality: Right;   SHOULDER OPEN ROTATOR CUFF REPAIR Right 08/05/2019   Procedure: ROTATOR CUFF REPAIR SHOULDER OPEN;  Surgeon: Vickki Hearing, MD;  Location: [facility];  Service: Orthopedics;  Laterality: Right;   SHOULDER OPEN ROTATOR CUFF REPAIR Right 05/13/2021   Procedure: OPEN ROTATOR CUFF REPAIR RIGHT SHOULDER;  Surgeon: Vickki Hearing, MD;  Location: [facility];  Service: Orthopedics;  Laterality: Right;   TUBAL LIGATION      There  were no vitals filed for this visit.   Subjective Assessment - 07/14/21 1054     Subjective  S: This weather is not my friend today.    Currently in Pain? Yes    Pain Score 8     Pain Location Shoulder    Pain Orientation Right    Pain Descriptors / Indicators Constant;Aching    Pain Type Surgical pain    Pain Onset More than a month ago    Pain Frequency Constant    Aggravating Factors  movement, weather    Pain Relieving Factors pain medication, ice    Effect of Pain on Daily Activities Max effect                [facility] OT Assessment - 07/14/21 1055       Assessment   Medical Diagnosis Right RTC repair      Precautions    Precautions Shoulder    Type of Shoulder Precautions Standard RCR protocol   Sling on 4-6 weeks   0-6 weeks: PROM, pendulum   6-12 weeks: AAROM progressing to AROM   12-24 weeks: Strengthening exercises                      OT Treatments/Exercises (OP) - 07/14/21 1056       Exercises   Exercises Shoulder      Shoulder Exercises: Supine   Protraction PROM;5 reps;AAROM;10 reps    Horizontal ABduction PROM;5 reps;AAROM;10 reps;Limitations    Horizontal ABduction Limitations limited ROM tolerated    External Rotation PROM;5 reps;AAROM;10 reps    Internal Rotation PROM;5 reps;AAROM;10 reps    Flexion PROM;5 reps;AAROM;10 reps;Limitations    Flexion Limitations to shoulder level due to shoulder popping    ABduction PROM;5 reps      Modalities   Modalities Electrical Stimulation;Moist Heat      Moist Heat Therapy   Number Minutes Moist Heat 10 Minutes    Moist Heat Location Shoulder      Electrical Stimulation   Electrical Stimulation Location right shoulder    Electrical Stimulation Action interferential    Electrical Stimulation Parameters 16.0 CV    Electrical Stimulation Goals Pain      Manual Therapy   Manual Therapy Myofascial release    Manual therapy comments completed separately from therapeutic exercises    Myofascial Release Myofascial release and manual stretching completed to the right upper arm, upper trapezius, and scapularis region to decrease fascial restrictions and increase joint mobility in a pain free zone.                      OT Short Term Goals - 07/04/21 1631       OT SHORT TERM GOAL #1   Title Patient will be educated and independent with HEP to faciliate progress in therapy and allow her to return to using her RUE as her dominant extremity for all daily and work activities.     Time 6    Period Weeks    Status On-going    Target Date 08/11/21      OT SHORT TERM GOAL #2   Title Patient will increase RUE P/ROM to [facility] in  order to complete upper body dressing tasks with less difficulty.    Time 6    Period Weeks    Status On-going      OT SHORT TERM GOAL #3   Title Pt will increase RUE strength to 3/5 in order to  complete waist level tasks with less difficulty.    Time 6    Period Weeks    Status On-going      OT SHORT TERM GOAL #4   Title Patient will report a decrease in pain level of 5/10 when completing daily tasks with her RUE.    Time 6    Period Weeks    Status On-going      OT SHORT TERM GOAL #5   Title Patient will demonstrate fascial restrictions of min amount or less in her RUE in order to increase functional mobility needed to complete reaching tasks.    Time 6    Period Weeks    Status On-going               OT Long Term Goals - 07/04/21 1631       OT LONG TERM GOAL #1   Title Patient will return to highest level of independence while returning to work using her RUE for 75% or more of daily tasks.    Time 12    Period Weeks    Status On-going    Target Date 09/22/21      OT LONG TERM GOAL #2   Title Patient will increase A/ROM to [facility] in order to increase ability to reach above shoulder level and into cabinets with less difficulty.    Time 12    Period Weeks    Status On-going      OT LONG TERM GOAL #3   Title Patient will increase RUE strength to 44/5 in order to lift normal houshold items of moderate weight.    Time 12    Period Weeks    Status On-going      OT LONG TERM GOAL #4   Title Patient will report a decrease in pain of approximately 3/10 or less when using her RUE for daily tasks.    Time 12    Period Weeks    Status On-going                   Plan - 07/14/21 1112     Clinical Impression Statement A: Arrived to session with greater pain level in right shoulder. Briefly completed myofascial release to right upper arm and upper trapezius to address fasical restrictions. Gentle passive ROM completed followed by AA/ROM. Focused on keeping the  RUE from moving into extension/passed neutral during exercises to help decrease pain and discomfort. Due to increase pain level, ES and moist heat were used at end of session. patient reports a pain score of  7/10 at end of treatment.    Body Structure / Function / Physical Skills ADL;UE functional use;Pain;Fascial restriction;ROM;Strength;Mobility;IADL    Plan P: Follow up on effects of ES and heat from previous session. Continue to progress AA/ROM as pain allows. Attempt pulleys.    Consulted and Agree with Plan of Care Patient             Patient will benefit from skilled therapeutic intervention in order to improve the following deficits and impairments:   Body Structure / Function / Physical Skills: ADL, UE functional use, Pain, Fascial restriction, ROM, Strength, Mobility, IADL       Visit Diagnosis: Acute pain of right shoulder  Stiffness of right shoulder, not elsewhere classified  Other symptoms and signs involving the musculoskeletal system    Problem List Patient Active Problem List   Diagnosis Date Noted   Arthrosis of right acromioclavicular joint    PAD (  peripheral artery disease) (HCC) 07/13/2020   S/P rotator cuff surgery 10/04/2018   Complete tear of right rotator cuff    Chronic pain of left knee    Left shoulder pain 09/21/2015   Disorders of bursae and tendons in shoulder region, unspecified 09/25/2013    Mekaila Tarnow, OTR/L,CBIS  [phone]  07/14/2021, 11:16 AM  [facility] [address] [place], [place], [address] Phone: [phone]   Fax:  [phone]  Name: LAMEEKA SCHLEIFER MRN: [id number] Date of Birth: [birth year]

## 2021-07-20 ENCOUNTER — Telehealth (HOSPITAL_COMMUNITY): Payer: Self-pay | Admitting: Occupational Therapy

## 2021-07-20 ENCOUNTER — Telehealth: Payer: Self-pay

## 2021-07-20 ENCOUNTER — Ambulatory Visit (HOSPITAL_COMMUNITY): Payer: BC Managed Care – PPO | Admitting: Occupational Therapy

## 2021-07-20 ENCOUNTER — Other Ambulatory Visit: Payer: Self-pay | Admitting: Orthopedic Surgery

## 2021-07-20 DIAGNOSIS — Z9889 Other specified postprocedural states: Secondary | ICD-10-CM

## 2021-07-20 MED ORDER — OXYCODONE-ACETAMINOPHEN 5-325 MG PO TABS: 1.0000 | ORAL_TABLET | Freq: Four times a day (QID) | ORAL | 0 refills | Status: DC | PRN

## 2021-07-20 NOTE — Telephone Encounter (Signed)
Attempted to contact pt regarding no show for today's OT appt. Mobile number listed was the wrong number, no answer and no voicemail at home phone.    Ezra Sites, OTR/L  [phone] 07/20/21

## 2021-07-20 NOTE — Telephone Encounter (Signed)
Patient called. Stated she needs refill on oxycodone please. Medication is no longer on patients med list.

## 2021-07-20 NOTE — Progress Notes (Signed)
.  medtsh Meds ordered this encounter  Medications   oxyCODONE-acetaminophen (PERCOCET/ROXICET) 5-325 MG tablet    Sig: Take 1 tablet by mouth every 6 (six) hours as needed for up to 5 days for severe pain.    Dispense:  20 tablet    Refill:  0

## 2021-07-21 ENCOUNTER — Other Ambulatory Visit: Payer: Self-pay

## 2021-07-21 ENCOUNTER — Encounter (HOSPITAL_COMMUNITY): Payer: Self-pay | Admitting: Occupational Therapy

## 2021-07-21 ENCOUNTER — Ambulatory Visit (HOSPITAL_COMMUNITY): Payer: BC Managed Care – PPO | Admitting: Occupational Therapy

## 2021-07-21 DIAGNOSIS — R29898 Other symptoms and signs involving the musculoskeletal system: Secondary | ICD-10-CM

## 2021-07-21 DIAGNOSIS — M25611 Stiffness of right shoulder, not elsewhere classified: Secondary | ICD-10-CM

## 2021-07-21 DIAGNOSIS — M25511 Pain in right shoulder: Secondary | ICD-10-CM

## 2021-07-21 NOTE — Therapy (Signed)
[facility] [address] [place], [place], [address] Phone: [phone]   Fax:  [phone]  Occupational Therapy Treatment  Patient Details  Name: Tina Pham MRN: [id number] Date of Birth: 13-Jan-1976 Referring Provider (OT): Fuller Canada, MD   Encounter Date: 07/21/2021   OT End of Session - 07/21/21 1616     Visit Number 6    Number of Visits 24    Date for OT Re-Evaluation 09/22/21   mini reassess:07/28/21   Authorization Type [employer] Commercial    Authorization Time Period 30 visit limit. 0 used.    Authorization - Visit Number 6    Authorization - Number of Visits 30    OT Start Time 1430    OT Stop Time 1510    OT Time Calculation (min) 40 min    Activity Tolerance Patient limited by pain    Behavior During Therapy Flat affect             Past Medical History:  Diagnosis Date   Anxiety    panic attacks   Arthritis    Chronic back pain    Chronic bronchitis (HCC)    Depression    Diabetes mellitus    GERD (gastroesophageal reflux disease)    prn tums   High cholesterol    History of kidney stones    passed ?   Hypertension    no longer on BP medications   Migraine    Neuropathic pain of foot    Plantar fasciitis    Pneumonia 2011ish   Sciatica    Torn rotator cuff     Past Surgical History:  Procedure Laterality Date   ABDOMINAL AORTOGRAM W/LOWER EXTREMITY N/A 07/01/2020   Procedure: ABDOMINAL AORTOGRAM W/LOWER EXTREMITY;  Surgeon: Cephus Shelling, MD;  [facility];  Service: Cardiovascular;  Laterality: N/A;   AMPUTATION Left 07/22/2020   Procedure: LEFT GREAT TOE AMPUTATION;  Surgeon: Maeola Harman, MD;  Location: [facility] OR;  Service: Vascular;  Laterality: Left;   BACK SURGERY     herniated disc; had disc removed and then fusion; total of 3 surgeries   EYE SURGERY Bilateral    removal of cyst and straightening of muscles   KNEE ARTHROSCOPY WITH MEDIAL  MENISECTOMY Left 12/27/2016   Procedure: LEFT KNEE DIAGNOSTIC ARTHROSCOPY;  Surgeon: Vickki Hearing, MD;  Location: [facility];  Service: Orthopedics;  Laterality: Left;   PERIPHERAL VASCULAR INTERVENTION Left 07/01/2020   Procedure: PERIPHERAL VASCULAR INTERVENTION;  Surgeon: Cephus Shelling, MD;  [facility];  Service: Cardiovascular;  Laterality: Left;  common iliac   SHOULDER ARTHROSCOPY WITH DISTAL CLAVICLE RESECTION Right 05/13/2021   Procedure: ARTHROSCOPY RIGHT SHOULDER WITH DISTAL CLAVICLE EXCISION;  Surgeon: Vickki Hearing, MD;  Location: [facility];  Service: Orthopedics;  Laterality: Right;   SHOULDER OPEN ROTATOR CUFF REPAIR Right 09/19/2018   Procedure: ROTATOR CUFF REPAIR SHOULDER OPEN;  Surgeon: Vickki Hearing, MD;  Location: [facility];  Service: Orthopedics;  Laterality: Right;   SHOULDER OPEN ROTATOR CUFF REPAIR Right 08/05/2019   Procedure: ROTATOR CUFF REPAIR SHOULDER OPEN;  Surgeon: Vickki Hearing, MD;  Location: [facility];  Service: Orthopedics;  Laterality: Right;   SHOULDER OPEN ROTATOR CUFF REPAIR Right 05/13/2021   Procedure: OPEN ROTATOR CUFF REPAIR RIGHT SHOULDER;  Surgeon: Vickki Hearing, MD;  Location: [facility];  Service: Orthopedics;  Laterality: Right;   TUBAL LIGATION      There were no vitals  filed for this visit.   Subjective Assessment - 07/21/21 1428     Subjective  S: Pain is between a 5 and a 6 which is better.    Pain Score 6     Pain Location Shoulder    Pain Orientation Right    Pain Descriptors / Indicators Constant;Aching    Pain Type Surgical pain    Pain Onset More than a month ago    Pain Frequency Constant    Aggravating Factors  movement, weather    Pain Relieving Factors pain medication, ice    Effect of Pain on Daily Activities max effect                [facility] OT Assessment - 07/21/21 0001       Assessment   Medical Diagnosis Right RTC repair      Precautions   Precautions Shoulder    Type of  Shoulder Precautions Standard RCR protocol   Sling on 4-6 weeks   0-6 weeks: PROM, pendulum   6-12 weeks: AAROM progressing to AROM   12-24 weeks: Strengthening exercises                      OT Treatments/Exercises (OP) - 07/21/21 0001       Exercises   Exercises Shoulder      Shoulder Exercises: Supine   Protraction PROM;5 reps;AAROM;10 reps    Horizontal ABduction PROM;5 reps;AAROM;10 reps;Limitations    External Rotation PROM;5 reps;AAROM;10 reps    Internal Rotation PROM;5 reps;AAROM;10 reps    Flexion PROM;5 reps;AAROM;10 reps;Limitations    Flexion Limitations Pt tolerated near full P/ROM.    ABduction PROM;5 reps;AAROM;10 reps    ABduction Limitations limited to 50% range due to pain. pt reported feeling a grinding last 3 reps of AA/ROM      Shoulder Exercises: Seated   Protraction AAROM;10 reps    Protraction Limitations Pt reported feeling a "catch" last 2 to 3 reps.    Horizontal ABduction AAROM;10 reps   2x5 reps   External Rotation AAROM;10 reps    Internal Rotation AAROM;10 reps      Shoulder Exercises: Standing   Other Standing Exercises PVC pipe slide flexion, 10X      Shoulder Exercises: Pulleys   Flexion 1 minute    ABduction 1 minute      Manual Therapy   Manual Therapy Myofascial release    Manual therapy comments completed separately from therapeutic exercises    Myofascial Release Myofascial release and manual stretching completed to the right upper arm, upper trapezius, and scapularis region to decrease fascial restrictions and increase joint mobility in a pain free zone.                      OT Short Term Goals - 07/04/21 1631       OT SHORT TERM GOAL #1   Title Patient will be educated and independent with HEP to faciliate progress in therapy and allow her to return to using her RUE as her dominant extremity for all daily and work activities.     Time 6    Period Weeks    Status On-going    Target Date 08/11/21       OT SHORT TERM GOAL #2   Title Patient will increase RUE P/ROM to [facility] in order to complete upper body dressing tasks with less difficulty.    Time 6    Period Weeks    Status  On-going      OT SHORT TERM GOAL #3   Title Pt will increase RUE strength to 3/5 in order to complete waist level tasks with less difficulty.    Time 6    Period Weeks    Status On-going      OT SHORT TERM GOAL #4   Title Patient will report a decrease in pain level of 5/10 when completing daily tasks with her RUE.    Time 6    Period Weeks    Status On-going      OT SHORT TERM GOAL #5   Title Patient will demonstrate fascial restrictions of min amount or less in her RUE in order to increase functional mobility needed to complete reaching tasks.    Time 6    Period Weeks    Status On-going               OT Long Term Goals - 07/04/21 1631       OT LONG TERM GOAL #1   Title Patient will return to highest level of independence while returning to work using her RUE for 75% or more of daily tasks.    Time 12    Period Weeks    Status On-going    Target Date 09/22/21      OT LONG TERM GOAL #2   Title Patient will increase A/ROM to [facility] in order to increase ability to reach above shoulder level and into cabinets with less difficulty.    Time 12    Period Weeks    Status On-going      OT LONG TERM GOAL #3   Title Patient will increase RUE strength to 44/5 in order to lift normal houshold items of moderate weight.    Time 12    Period Weeks    Status On-going      OT LONG TERM GOAL #4   Title Patient will report a decrease in pain of approximately 3/10 or less when using her RUE for daily tasks.    Time 12    Period Weeks    Status On-going                   Plan - 07/21/21 1618     Clinical Impression Statement A: Pt arrived with less pain reporting 6/10. Pt able to continue myofascial realse follwed by P/ROM and AA/ROM. Pt tolerated P/ROM of abduction to near fuctional limits. Pt  able to complete AA/ROM supine to ~50% of range for abduction. Pt progressed to seated A/ROM for all typical movements but abduction. Session finished with pt completing pulleys. Verbal and tactile cuing needed throughout session.    Body Structure / Function / Physical Skills ADL;UE functional use;Pain;Fascial restriction;ROM;Strength;Mobility;IADL    Plan P: Continue to progress seated AA/ROM and abduction AA/ROM in supine. Continue pullleys. Possibly add wall wash if tolerated.    OT Home Exercise Plan eval: table slides, A/ROM wrist, forearm, elbow, pendulums 12/20: AA/ROM supine - flexion, IR/er, protraction, horizontal abduction/adduction    Consulted and Agree with Plan of Care Patient             Patient will benefit from skilled therapeutic intervention in order to improve the following deficits and impairments:   Body Structure / Function / Physical Skills: ADL, UE functional use, Pain, Fascial restriction, ROM, Strength, Mobility, IADL       Visit Diagnosis: Acute pain of right shoulder  Stiffness of right shoulder, not elsewhere classified  Other  symptoms and signs involving the musculoskeletal system    Problem List Patient Active Problem List   Diagnosis Date Noted   Arthrosis of right acromioclavicular joint    PAD (peripheral artery disease) (HCC) 07/13/2020   S/P rotator cuff surgery 10/04/2018   Complete tear of right rotator cuff    Chronic pain of left knee    Left shoulder pain 09/21/2015   Disorders of bursae and tendons in shoulder region, unspecified 09/25/2013   Danie Chandler OT, MOT  Danie Chandler, OT 07/21/2021, 4:21 PM  [facility] [address] [place], [place], [address] Phone: [phone]   Fax:  [phone]  Name: Tina Pham MRN: [id number] Date of Birth: 16-Dec-1975

## 2021-07-22 ENCOUNTER — Ambulatory Visit (HOSPITAL_COMMUNITY): Payer: BC Managed Care – PPO | Admitting: Occupational Therapy

## 2021-07-25 ENCOUNTER — Other Ambulatory Visit: Payer: Self-pay | Admitting: Orthopedic Surgery

## 2021-07-25 DIAGNOSIS — Z9889 Other specified postprocedural states: Secondary | ICD-10-CM

## 2021-07-26 ENCOUNTER — Encounter (HOSPITAL_COMMUNITY): Payer: Self-pay

## 2021-07-26 ENCOUNTER — Other Ambulatory Visit: Payer: Self-pay

## 2021-07-26 ENCOUNTER — Ambulatory Visit (HOSPITAL_COMMUNITY): Payer: BC Managed Care – PPO | Attending: Orthopedic Surgery

## 2021-07-26 DIAGNOSIS — M25511 Pain in right shoulder: Secondary | ICD-10-CM | POA: Diagnosis present

## 2021-07-26 DIAGNOSIS — M25611 Stiffness of right shoulder, not elsewhere classified: Secondary | ICD-10-CM

## 2021-07-26 DIAGNOSIS — R29898 Other symptoms and signs involving the musculoskeletal system: Secondary | ICD-10-CM

## 2021-07-26 MED ORDER — OXYCODONE-ACETAMINOPHEN 5-325 MG PO TABS: 1.0000 | ORAL_TABLET | Freq: Four times a day (QID) | ORAL | 0 refills | Status: DC | PRN

## 2021-07-26 NOTE — Therapy (Addendum)
[facility] [address] [place], [place], [address] Phone: [phone]   Fax:  [phone]  Occupational Therapy Treatment  Patient Details  Name: Tina Pham MRN: [id number] Date of Birth: Dec 02, 1975 Referring Provider (OT): Fuller Canada, MD   Encounter Date: 07/26/2021   OT End of Session - 07/26/21 1122     Visit Number 7    Number of Visits 24    Date for OT Re-Evaluation 09/22/21   mini reassess:07/28/21   Authorization Type BC[employer] Commercial    Authorization Time Period 30 visit limit. 0 used.    Authorization - Visit Number 1   Authorization - Number of Visits 30    OT Start Time [id number]   pt checked in late   OT Stop Time 1023    OT Time Calculation (min) 35 min    Activity Tolerance Patient limited by pain;Patient tolerated treatment well    Behavior During Therapy Flat affect             Past Medical History:  Diagnosis Date   Anxiety    panic attacks   Arthritis    Chronic back pain    Chronic bronchitis (HCC)    Depression    Diabetes mellitus    GERD (gastroesophageal reflux disease)    prn tums   High cholesterol    History of kidney stones    passed ?   Hypertension    no longer on BP medications   Migraine    Neuropathic pain of foot    Plantar fasciitis    Pneumonia 2011ish   Sciatica    Torn rotator cuff     Past Surgical History:  Procedure Laterality Date   ABDOMINAL AORTOGRAM W/LOWER EXTREMITY N/A 07/01/2020   Procedure: ABDOMINAL AORTOGRAM W/LOWER EXTREMITY;  Surgeon: Cephus Shelling, MD;  [facility];  Service: Cardiovascular;  Laterality: N/A;   AMPUTATION Left 07/22/2020   Procedure: LEFT GREAT TOE AMPUTATION;  Surgeon: Maeola Harman, MD;  Location: [facility] OR;  Service: Vascular;  Laterality: Left;   BACK SURGERY     herniated disc; had disc removed and then fusion; total of 3 surgeries   EYE SURGERY Bilateral    removal of cyst and straightening of  muscles   KNEE ARTHROSCOPY WITH MEDIAL MENISECTOMY Left 12/27/2016   Procedure: LEFT KNEE DIAGNOSTIC ARTHROSCOPY;  Surgeon: Vickki Hearing, MD;  Location: [facility];  Service: Orthopedics;  Laterality: Left;   PERIPHERAL VASCULAR INTERVENTION Left 07/01/2020   Procedure: PERIPHERAL VASCULAR INTERVENTION;  Surgeon: Cephus Shelling, MD;  [facility];  Service: Cardiovascular;  Laterality: Left;  common iliac   SHOULDER ARTHROSCOPY WITH DISTAL CLAVICLE RESECTION Right 05/13/2021   Procedure: ARTHROSCOPY RIGHT SHOULDER WITH DISTAL CLAVICLE EXCISION;  Surgeon: Vickki Hearing, MD;  Location: [facility];  Service: Orthopedics;  Laterality: Right;   SHOULDER OPEN ROTATOR CUFF REPAIR Right 09/19/2018   Procedure: ROTATOR CUFF REPAIR SHOULDER OPEN;  Surgeon: Vickki Hearing, MD;  Location: [facility];  Service: Orthopedics;  Laterality: Right;   SHOULDER OPEN ROTATOR CUFF REPAIR Right 08/05/2019   Procedure: ROTATOR CUFF REPAIR SHOULDER OPEN;  Surgeon: Vickki Hearing, MD;  Location: [facility];  Service: Orthopedics;  Laterality: Right;   SHOULDER OPEN ROTATOR CUFF REPAIR Right 05/13/2021   Procedure: OPEN ROTATOR CUFF REPAIR RIGHT SHOULDER;  Surgeon: Vickki Hearing, MD;  Location: [facility];  Service: Orthopedics;  Laterality: Right;   TUBAL LIGATION  There were no vitals filed for this visit.   Subjective Assessment - 07/26/21 1007     Subjective  S; I did the pulleys last time.    Currently in Pain? Yes    Pain Score 6     Pain Location Shoulder    Pain Orientation Right    Pain Descriptors / Indicators Constant;Aching    Pain Type Surgical pain    Pain Onset More than a month ago    Pain Frequency Constant    Aggravating Factors  movement, weather    Pain Relieving Factors ice, pain medicaid, heat    Effect of Pain on Daily Activities max effect    Multiple Pain Sites No                [facility] OT Assessment - 07/26/21 1010       Assessment   Medical  Diagnosis Right RTC repair      Precautions   Precautions Shoulder                      OT Treatments/Exercises (OP) - 07/26/21 1010       Exercises   Exercises Shoulder      Shoulder Exercises: Supine   Protraction PROM;5 reps;AAROM;10 reps    Horizontal ABduction PROM;5 reps;AAROM;10 reps    External Rotation PROM;5 reps;AAROM;10 reps    Internal Rotation PROM;5 reps;AAROM;10 reps    Flexion PROM;5 reps;AAROM;10 reps    ABduction PROM;5 reps;AAROM;10 reps      Shoulder Exercises: Seated   Protraction AAROM;10 reps    Horizontal ABduction AAROM;10 reps    External Rotation AAROM;10 reps    Internal Rotation AAROM;10 reps    Flexion AAROM;10 reps;Limitations (P)     Flexion Limitations kept movement just under where popping sensation is felt. (P)     Abduction AAROM;10 reps (P)       Shoulder Exercises: Pulleys   Flexion 1 minute (P)    standing   ABduction 1 minute (P)    standing     Manual Therapy   Manual Therapy Myofascial release    Manual therapy comments completed separately from therapeutic exercises    Myofascial Release Myofascial release and manual stretching completed to the right upper arm, upper trapezius, and scapularis region to decrease fascial restrictions and increase joint mobility in a pain free zone.                      OT Short Term Goals - 07/04/21 1631       OT SHORT TERM GOAL #1   Title Patient will be educated and independent with HEP to faciliate progress in therapy and allow her to return to using her RUE as her dominant extremity for all daily and work activities.     Time 6    Period Weeks    Status On-going    Target Date 08/11/21      OT SHORT TERM GOAL #2   Title Patient will increase RUE P/ROM to [facility] in order to complete upper body dressing tasks with less difficulty.    Time 6    Period Weeks    Status On-going      OT SHORT TERM GOAL #3   Title Pt will increase RUE strength to 3/5 in order to  complete waist level tasks with less difficulty.    Time 6    Period Weeks    Status On-going  OT SHORT TERM GOAL #4   Title Patient will report a decrease in pain level of 5/10 when completing daily tasks with her RUE.    Time 6    Period Weeks    Status On-going      OT SHORT TERM GOAL #5   Title Patient will demonstrate fascial restrictions of min amount or less in her RUE in order to increase functional mobility needed to complete reaching tasks.    Time 6    Period Weeks    Status On-going               OT Long Term Goals - 07/04/21 1631       OT LONG TERM GOAL #1   Title Patient will return to highest level of independence while returning to work using her RUE for 75% or more of daily tasks.    Time 12    Period Weeks    Status On-going    Target Date 09/22/21      OT LONG TERM GOAL #2   Title Patient will increase A/ROM to [facility] in order to increase ability to reach above shoulder level and into cabinets with less difficulty.    Time 12    Period Weeks    Status On-going      OT LONG TERM GOAL #3   Title Patient will increase RUE strength to 44/5 in order to lift normal houshold items of moderate weight.    Time 12    Period Weeks    Status On-going      OT LONG TERM GOAL #4   Title Patient will report a decrease in pain of approximately 3/10 or less when using her RUE for daily tasks.    Time 12    Period Weeks    Status On-going                   Plan - 07/26/21 1144     Clinical Impression Statement A: Reports a pain level of 6/10. Myofascial release was completed to address moderate to maximum fascial restrictions noted along the right upper trapezius, upper arm proximal to elbow and mid upper arm. Able to tolerate functional passive ROM. Due to pain or discomfort modificatons were made to exercises as needed. VC for form and technique provided.    Body Structure / Function / Physical Skills ADL;UE functional use;Pain;Fascial  restriction;ROM;Strength;Mobility;IADL    Plan P: Provide all AA/ROM exercises for HEP and have patient  progress to standing or sitting. PVC pipe slide.    Consulted and Agree with Plan of Care Patient             Patient will benefit from skilled therapeutic intervention in order to improve the following deficits and impairments:   Body Structure / Function / Physical Skills: ADL, UE functional use, Pain, Fascial restriction, ROM, Strength, Mobility, IADL       Visit Diagnosis: Acute pain of right shoulder  Stiffness of right shoulder, not elsewhere classified  Other symptoms and signs involving the musculoskeletal system    Problem List Patient Active Problem List   Diagnosis Date Noted   Arthrosis of right acromioclavicular joint    PAD (peripheral artery disease) ([place]) 07/13/2020   S/P rotator cuff surgery 10/04/2018   Complete tear of right rotator cuff    Chronic pain of left knee    Left shoulder pain 09/21/2015   Disorders of bursae and tendons in shoulder region, unspecified 09/25/2013  Endea Yaeger, OTR/L,CBIS  [phone]  07/26/2021, 11:48 AM  [facility] [address] [place], [place], [address] Phone: [phone]   Fax:  [phone]  Name: ANGELLEE AUSTERMAN MRN: [id number] Date of Birth: November 10, 1975

## 2021-07-27 ENCOUNTER — Ambulatory Visit (HOSPITAL_COMMUNITY): Payer: BC Managed Care – PPO

## 2021-07-27 ENCOUNTER — Encounter (HOSPITAL_COMMUNITY): Payer: Self-pay

## 2021-07-27 DIAGNOSIS — R29898 Other symptoms and signs involving the musculoskeletal system: Secondary | ICD-10-CM

## 2021-07-27 DIAGNOSIS — M25511 Pain in right shoulder: Secondary | ICD-10-CM

## 2021-07-27 DIAGNOSIS — M25611 Stiffness of right shoulder, not elsewhere classified: Secondary | ICD-10-CM

## 2021-07-27 NOTE — Therapy (Signed)
[place], [place], [address] Phone: [phone]   Fax:  [phone]  Occupational Therapy Treatment  Patient Details  Name: Tina Pham MRN: [id number] Date of Birth: 1975-10-28 Referring Provider (OT): Arther Abbott, MD   Encounter Date: 07/27/2021   OT End of Session - 07/27/21 1028     Visit Number 8    Number of Visits 24    Date for OT Re-Evaluation 09/22/21   mini reassess:07/28/21   Authorization Type [employer] Commercial    Authorization Time Period 30 visit limit. 0 used.    Authorization - Visit Number 2    Authorization - Number of Visits 30    OT Start Time [phone]   pt checked in late   OT Stop Time 1023    OT Time Calculation (min) 33 min    Activity Tolerance Patient limited by pain;Patient tolerated treatment well    Behavior During Therapy Flat affect             Past Medical History:  Diagnosis Date   Anxiety    panic attacks   Arthritis    Chronic back pain    Chronic bronchitis (HCC)    Depression    Diabetes mellitus    GERD (gastroesophageal reflux disease)    prn tums   High cholesterol    History of kidney stones    passed ?   Hypertension    no longer on BP medications   Migraine    Neuropathic pain of foot    Plantar fasciitis    Pneumonia 2011ish   Sciatica    Torn rotator cuff     Past Surgical History:  Procedure Laterality Date   ABDOMINAL AORTOGRAM W/LOWER EXTREMITY N/A 07/01/2020   Procedure: ABDOMINAL AORTOGRAM W/LOWER EXTREMITY;  Surgeon: Marty Heck, MD;  Location: [facility];  Service: Cardiovascular;  Laterality: N/A;   AMPUTATION Left 07/22/2020   Procedure: LEFT GREAT TOE AMPUTATION;  Surgeon: Waynetta Sandy, MD;  Location: [facility];  Service: Vascular;  Laterality: Left;   BACK SURGERY     herniated disc; had disc removed and then fusion; total of 3 surgeries   EYE SURGERY Bilateral    removal of cyst and straightening  of muscles   KNEE ARTHROSCOPY WITH MEDIAL MENISECTOMY Left 12/27/2016   Procedure: LEFT KNEE DIAGNOSTIC ARTHROSCOPY;  Surgeon: Carole Civil, MD;  [facility]: [facility];  Service: Orthopedics;  Laterality: Left;   PERIPHERAL VASCULAR INTERVENTION Left 07/01/2020   Procedure: PERIPHERAL VASCULAR INTERVENTION;  Surgeon: Marty Heck, MD;  Location: [facility];  Service: Cardiovascular;  Laterality: Left;  common iliac   SHOULDER ARTHROSCOPY WITH DISTAL CLAVICLE RESECTION Right 05/13/2021   Procedure: ARTHROSCOPY RIGHT SHOULDER WITH DISTAL CLAVICLE EXCISION;  Surgeon: Carole Civil, MD;  Location: [facility];  Service: Orthopedics;  Laterality: Right;   SHOULDER OPEN ROTATOR CUFF REPAIR Right 09/19/2018   Procedure: ROTATOR CUFF REPAIR SHOULDER OPEN;  Surgeon: Carole Civil, MD;  Location: [facility];  Service: Orthopedics;  Laterality: Right;   SHOULDER OPEN ROTATOR CUFF REPAIR Right 08/05/2019   Procedure: ROTATOR CUFF REPAIR SHOULDER OPEN;  Surgeon: Carole Civil, MD;  Location: [facility];  Service: Orthopedics;  Laterality: Right;   SHOULDER OPEN ROTATOR CUFF REPAIR Right 05/13/2021   Procedure: OPEN ROTATOR CUFF REPAIR RIGHT SHOULDER;  Surgeon: Carole Civil, MD;  Location: [facility];  Service: Orthopedics;  Laterality: Right;   TUBAL LIGATION  There were no vitals filed for this visit.   Subjective Assessment - 07/27/21 1008     Subjective  S: I've been experimenting with different sleeping positions.    Currently in Pain? Yes    Pain Score 6     Pain Location Shoulder    Pain Orientation Right    Pain Descriptors / Indicators Constant;Aching    Pain Type Surgical pain    Pain Onset More than a month ago    Pain Frequency Constant    Aggravating Factors  movement, weather    Pain Relieving Factors ice, pain medication, heat    Effect of Pain on Daily Activities max effect    Multiple Pain Sites No                [facility] OT Assessment - 07/27/21 1009        Assessment   Medical Diagnosis Right RTC repair      Precautions   Precautions Shoulder    Type of Shoulder Precautions Standard RCR protocol   Sling on 4-6 weeks   0-6 weeks: PROM, pendulum   6-12 weeks: AAROM progressing to AROM   12-24 weeks: Strengthening exercises                      OT Treatments/Exercises (OP) - 07/27/21 1009       Exercises   Exercises Shoulder      Shoulder Exercises: Supine   Protraction PROM;5 reps;AROM;10 reps    External Rotation PROM;5 reps;AROM;10 reps    Internal Rotation PROM;5 reps;AROM;10 reps    Flexion PROM;5 reps;AAROM;10 reps    ABduction PROM;5 reps;AAROM;10 reps      Shoulder Exercises: Seated   Protraction AROM;10 reps    Horizontal ABduction AAROM;10 reps    External Rotation AROM;10 reps    Internal Rotation AROM;10 reps    Flexion AAROM;10 reps;Limitations    Flexion Limitations kept movement just under where popping sensation is felt.      Shoulder Exercises: ROM/Strengthening   Proximal Shoulder Strengthening, Supine 10X A/ROM    Other ROM/Strengthening Exercises PVC pipe slide 10X flexion      Manual Therapy   Manual Therapy Myofascial release    Manual therapy comments completed separately from therapeutic exercises    Myofascial Release Myofascial release and manual stretching completed to the right upper arm, upper trapezius, and scapularis region to decrease fascial restrictions and increase joint mobility in a pain free zone.                      OT Short Term Goals - 07/04/21 1631       OT SHORT TERM GOAL #1   Title Patient will be educated and independent with HEP to faciliate progress in therapy and allow her to return to using her RUE as her dominant extremity for all daily and work activities.     Time 6    Period Weeks    Status On-going    Target Date 08/11/21      OT SHORT TERM GOAL #2   Title Patient will increase RUE P/ROM to [facility] in order to complete upper body  dressing tasks with less difficulty.    Time 6    Period Weeks    Status On-going      OT SHORT TERM GOAL #3   Title Pt will increase RUE strength to 3/5 in order to complete waist level tasks with less difficulty.  Time 6    Period Weeks    Status On-going      OT SHORT TERM GOAL #4   Title Patient will report a decrease in pain level of 5/10 when completing daily tasks with her RUE.    Time 6    Period Weeks    Status On-going      OT SHORT TERM GOAL #5   Title Patient will demonstrate fascial restrictions of min amount or less in her RUE in order to increase functional mobility needed to complete reaching tasks.    Time 6    Period Weeks    Status On-going               OT Long Term Goals - 07/04/21 1631       OT LONG TERM GOAL #1   Title Patient will return to highest level of independence while returning to work using her RUE for 75% or more of daily tasks.    Time 12    Period Weeks    Status On-going    Target Date 09/22/21      OT LONG TERM GOAL #2   Title Patient will increase A/ROM to [facility] in order to increase ability to reach above shoulder level and into cabinets with less difficulty.    Time 12    Period Weeks    Status On-going      OT LONG TERM GOAL #3   Title Patient will increase RUE strength to 44/5 in order to lift normal houshold items of moderate weight.    Time 12    Period Weeks    Status On-going      OT LONG TERM GOAL #4   Title Patient will report a decrease in pain of approximately 3/10 or less when using her RUE for daily tasks.    Time 12    Period Weeks    Status On-going                   Plan - 07/27/21 1029     Clinical Impression Statement A: Myofascial release completed to address minimal fascial restrictions in right upper arm and upper trapezius. Small size trigger point located in right upper trapezius. Patient able to tolerate close to full passive ROM. Continues to be limited by pain. Was able to complete  some supine A/ROM exercises. Due to pain level AA/ROM continues to be appropriate. Instructed patient to complete HEP AA/ROM standing or seated now. VC for form and technique were provided.    Body Structure / Function / Physical Skills ADL;UE functional use;Pain;Fascial restriction;ROM;Strength;Mobility;IADL    Plan P: mini reassessment. MD appointment scheduled Thursday with Dr. Aline Brochure.             Patient will benefit from skilled therapeutic intervention in order to improve the following deficits and impairments:   Body Structure / Function / Physical Skills: ADL, UE functional use, Pain, Fascial restriction, ROM, Strength, Mobility, IADL       Visit Diagnosis: Acute pain of right shoulder  Stiffness of right shoulder, not elsewhere classified  Other symptoms and signs involving the musculoskeletal system    Problem List Patient Active Problem List   Diagnosis Date Noted   Arthrosis of right acromioclavicular joint    PAD (peripheral artery disease) ([place]) 07/13/2020   S/P rotator cuff surgery 10/04/2018   Complete tear of right rotator cuff    Chronic pain of left knee    Left shoulder pain 09/21/2015  Disorders of bursae and tendons in shoulder region, unspecified 09/25/2013   Timara Dohner, OTR/L,CBIS  [phone]  07/27/2021, 10:32 AM  [facility] [address] [place], [place], [address] Phone: [phone]   Fax:  [phone]  Name: Tina Pham MRN: [id number] Date of Birth: Jul 15, 1976

## 2021-07-31 ENCOUNTER — Other Ambulatory Visit: Payer: Self-pay | Admitting: Orthopedic Surgery

## 2021-07-31 DIAGNOSIS — Z9889 Other specified postprocedural states: Secondary | ICD-10-CM

## 2021-08-01 MED ORDER — OXYCODONE-ACETAMINOPHEN 5-325 MG PO TABS: 1.0000 | ORAL_TABLET | Freq: Four times a day (QID) | ORAL | 0 refills | Status: DC | PRN

## 2021-08-01 MED ORDER — TIZANIDINE HCL 4 MG PO TABS: 4.0000 mg | ORAL_TABLET | Freq: Three times a day (TID) | ORAL | 0 refills | Status: AC

## 2021-08-02 ENCOUNTER — Encounter (HOSPITAL_COMMUNITY): Payer: Self-pay | Admitting: Occupational Therapy

## 2021-08-02 ENCOUNTER — Other Ambulatory Visit: Payer: Self-pay

## 2021-08-02 ENCOUNTER — Ambulatory Visit (HOSPITAL_COMMUNITY): Payer: BC Managed Care – PPO | Admitting: Occupational Therapy

## 2021-08-02 DIAGNOSIS — M25611 Stiffness of right shoulder, not elsewhere classified: Secondary | ICD-10-CM

## 2021-08-02 DIAGNOSIS — M25511 Pain in right shoulder: Secondary | ICD-10-CM

## 2021-08-02 DIAGNOSIS — R29898 Other symptoms and signs involving the musculoskeletal system: Secondary | ICD-10-CM

## 2021-08-02 NOTE — Therapy (Signed)
[place], [place], [address] Phone: [phone]   Fax:  [phone]  Occupational Therapy Treatment  (Mini-Reassessment)  Patient Details  Name: Tina Pham MRN: [id number] Date of Birth: June 24, 1976 Referring Provider (OT): Arther Abbott, MD      [facility] OT Assessment - 08/02/21 [phone]       Assessment   Medical Diagnosis Right RTC repair      Precautions   Precautions Shoulder    Type of Shoulder Precautions Standard RCR protocol   Sling on 4-6 weeks   0-6 weeks: PROM, pendulum   6-12 weeks: AAROM progressing to AROM   12-24 weeks: Strengthening exercises      Observation/Other Assessments   Focus on Therapeutic Outcomes (FOTO)  44/100   34/100 previous     Palpation   Palpation comment Min fascial restrictions noted in the right upper arm and upper trapezius/lateral cervical region   mod previous     AROM   Overall AROM Comments Assessed seated, er/IR adducted    AROM Assessment Site Shoulder    Right/Left Shoulder Right    Right Shoulder Flexion 85 Degrees   not previously assessed   Right Shoulder ABduction 90 Degrees   not previously assessed   Right Shoulder Internal Rotation 90 Degrees   not previously assessed   Right Shoulder External Rotation 77 Degrees   not previously assessed     PROM   Overall PROM Comments Assessed supine. IR/er adducted.    PROM Assessment Site Shoulder    Right/Left Shoulder Right    Right Shoulder Flexion 148 Degrees   93 previous   Right Shoulder ABduction 151 Degrees   118 previous   Right Shoulder Internal Rotation 90 Degrees   same as previous   Right Shoulder External Rotation 78 Degrees   60 previous     Strength   Overall Strength Comments Assessed seated, er/IR adducted    Strength Assessment Site Shoulder    Right/Left Shoulder Right    Right Shoulder Flexion 3+/5   not previously assessed   Right Shoulder ABduction 3-/5   not previously assessed    Right Shoulder Internal Rotation 4/5   not previously assessed   Right Shoulder External Rotation 4-/5   not previously assessed              Encounter Date: 08/02/2021   OT End of Session - 08/02/21 1030     Visit Number 9    Number of Visits 24    Date for OT Re-Evaluation 09/22/21   mini reassess:07/28/21   Authorization Type BC[employer] Commercial    Authorization Time Period 30 visit limit. 0 used.    Authorization - Visit Number 3    Authorization - Number of Visits 30    OT Start Time [id number]    OT Stop Time 1030    OT Time Calculation (min) 38 min    Activity Tolerance Patient limited by pain;Patient tolerated treatment well    Behavior During Therapy Flat affect             Past Medical History:  Diagnosis Date   Anxiety    panic attacks   Arthritis    Chronic back pain    Chronic bronchitis (HCC)    Depression    Diabetes mellitus    GERD (gastroesophageal reflux disease)    prn tums   High cholesterol    History of kidney stones    passed ?  Hypertension    no longer on BP medications   Migraine    Neuropathic pain of foot    Plantar fasciitis    Pneumonia 2011ish   Sciatica    Torn rotator cuff     Past Surgical History:  Procedure Laterality Date   ABDOMINAL AORTOGRAM W/LOWER EXTREMITY N/A 07/01/2020   Procedure: ABDOMINAL AORTOGRAM W/LOWER EXTREMITY;  Surgeon: Marty Heck, MD;  Location: [facility];  Service: Cardiovascular;  Laterality: N/A;   AMPUTATION Left 07/22/2020   Procedure: LEFT GREAT TOE AMPUTATION;  Surgeon: Waynetta Sandy, MD;  Location: [facility];  Service: Vascular;  Laterality: Left;   BACK SURGERY     herniated disc; had disc removed and then fusion; total of 3 surgeries   EYE SURGERY Bilateral    removal of cyst and straightening of muscles   KNEE ARTHROSCOPY WITH MEDIAL MENISECTOMY Left 12/27/2016   Procedure: LEFT KNEE DIAGNOSTIC ARTHROSCOPY;  Surgeon: Carole Civil, MD;  Location: [facility];   Service: Orthopedics;  Laterality: Left;   PERIPHERAL VASCULAR INTERVENTION Left 07/01/2020   Procedure: PERIPHERAL VASCULAR INTERVENTION;  Surgeon: Marty Heck, MD;  Location: [facility];  Service: Cardiovascular;  Laterality: Left;  common iliac   SHOULDER ARTHROSCOPY WITH DISTAL CLAVICLE RESECTION Right 05/13/2021   Procedure: ARTHROSCOPY RIGHT SHOULDER WITH DISTAL CLAVICLE EXCISION;  Surgeon: Carole Civil, MD;  Location: [facility];  Service: Orthopedics;  Laterality: Right;   SHOULDER OPEN ROTATOR CUFF REPAIR Right 09/19/2018   Procedure: ROTATOR CUFF REPAIR SHOULDER OPEN;  Surgeon: Carole Civil, MD;  Location: [facility];  Service: Orthopedics;  Laterality: Right;   SHOULDER OPEN ROTATOR CUFF REPAIR Right 08/05/2019   Procedure: ROTATOR CUFF REPAIR SHOULDER OPEN;  Surgeon: Carole Civil, MD;  [facility];  Service: Orthopedics;  Laterality: Right;   SHOULDER OPEN ROTATOR CUFF REPAIR Right 05/13/2021   Procedure: OPEN ROTATOR CUFF REPAIR RIGHT SHOULDER;  Surgeon: Carole Civil, MD;  Location: [facility];  Service: Orthopedics;  Laterality: Right;   TUBAL LIGATION      There were no vitals filed for this visit.   Subjective Assessment - 08/02/21 0952     Subjective  S: Dressing is getting easier.    Currently in Pain? Yes    Pain Score 6     Pain Location Shoulder    Pain Orientation Right    Pain Descriptors / Indicators Aching    Pain Type Acute pain    Pain Radiating Towards N/A    Pain Onset More than a month ago    Pain Frequency Constant    Aggravating Factors  certain movements    Pain Relieving Factors heat, pain medication    Effect of Pain on Daily Activities max effect on ADLs    Multiple Pain Sites No                      OT Treatments/Exercises (OP) - 08/02/21 0953       Exercises   Exercises Shoulder      Shoulder Exercises: Supine   Protraction PROM;5 reps    Horizontal ABduction PROM;5 reps    External  Rotation PROM;5 reps    Internal Rotation PROM;5 reps    Flexion PROM;5 reps    ABduction PROM;5 reps      Shoulder Exercises: Seated   Protraction AROM;10 reps    Horizontal ABduction AROM;10 reps    External Rotation AROM;10 reps    Internal  Rotation AROM;10 reps    Flexion AROM;10 reps      Shoulder Exercises: Standing   Extension Theraband;10 reps    Theraband Level (Shoulder Extension) Level 2 (Red)    Row Theraband;10 reps    Theraband Level (Shoulder Row) Level 2 (Red)      Shoulder Exercises: ROM/Strengthening   UBE (Upper Arm Bike) Level 1 2' forward, pace: 3.0    Wall Wash 1'    Other ROM/Strengthening Exercises PVC pipe slide 10X flexion      Manual Therapy   Manual Therapy Myofascial release    Manual therapy comments completed separately from therapeutic exercises    Myofascial Release Myofascial release and manual stretching completed to the right upper arm, upper trapezius, and scapularis region to decrease fascial restrictions and increase joint mobility in a pain free zone.                      OT Short Term Goals - 08/02/21 1012       OT SHORT TERM GOAL #1   Title Patient will be educated and independent with HEP to faciliate progress in therapy and allow her to return to using her RUE as her dominant extremity for all daily and work activities.     Time 6    Period Weeks    Status On-going    Target Date 08/11/21      OT SHORT TERM GOAL #2   Title Patient will increase RUE P/ROM to [facility] in order to complete upper body dressing tasks with less difficulty.    Time 6    Period Weeks    Status Achieved      OT SHORT TERM GOAL #3   Title Pt will increase RUE strength to 3/5 in order to complete waist level tasks with less difficulty.    Time 6    Period Weeks    Status Partially Met      OT SHORT TERM GOAL #4   Title Patient will report a decrease in pain level of 5/10 when completing daily tasks with her RUE.    Time 6    Period Weeks     Status On-going      OT SHORT TERM GOAL #5   Title Patient will demonstrate fascial restrictions of min amount or less in her RUE in order to increase functional mobility needed to complete reaching tasks.    Time 6    Period Weeks    Status Achieved               OT Long Term Goals - 07/04/21 1631       OT LONG TERM GOAL #1   Title Patient will return to highest level of independence while returning to work using her RUE for 75% or more of daily tasks.    Time 12    Period Weeks    Status On-going    Target Date 09/22/21      OT LONG TERM GOAL #2   Title Patient will increase A/ROM to [facility] in order to increase ability to reach above shoulder level and into cabinets with less difficulty.    Time 12    Period Weeks    Status On-going      OT LONG TERM GOAL #3   Title Patient will increase RUE strength to 44/5 in order to lift normal houshold items of moderate weight.    Time 12    Period Weeks  Status On-going      OT LONG TERM GOAL #4   Title Patient will report a decrease in pain of approximately 3/10 or less when using her RUE for daily tasks.    Time 12    Period Weeks    Status On-going                   Plan - 08/02/21 1013     Clinical Impression Statement A: Mini-reassessment completed this session, pt demonstrates improvements in ROM and strength, reports improvement in dressing task completion. Pt has met 2 STGs and partially met 1 additional STG thus far. Continued with myofascial release and passive stretching today. A/ROM completed in seated position, added wall wash, scapular theraband, and UBE today. Verbal cuing for form and technique.    Body Structure / Function / Physical Skills ADL;UE functional use;Pain;Fascial restriction;ROM;Strength;Mobility;IADL    Plan P: Follow up on MD appt. Continue progressing with A/ROM and update HEP when pt completing with good form    OT Home Exercise Plan eval: table slides, A/ROM wrist, forearm, elbow,  pendulums 12/20: AA/ROM supine - flexion, IR/er, protraction, horizontal abduction/adduction    Consulted and Agree with Plan of Care Patient             Patient will benefit from skilled therapeutic intervention in order to improve the following deficits and impairments:   Body Structure / Function / Physical Skills: ADL, UE functional use, Pain, Fascial restriction, ROM, Strength, Mobility, IADL       Visit Diagnosis: Acute pain of right shoulder  Stiffness of right shoulder, not elsewhere classified  Other symptoms and signs involving the musculoskeletal system    Problem List Patient Active Problem List   Diagnosis Date Noted   Arthrosis of right acromioclavicular joint    PAD (peripheral artery disease) ([place]) 07/13/2020   S/P rotator cuff surgery 10/04/2018   Complete tear of right rotator cuff    Chronic pain of left knee    Left shoulder pain 09/21/2015   Disorders of bursae and tendons in shoulder region, unspecified 09/25/2013    Guadelupe Sabin, OTR/L  [phone] 08/02/2021, 10:32 AM  [place], [place], [address] Phone: [phone]   Fax:  [phone]  Name: TAKAYA HYSLOP MRN: [id number] Date of Birth: 02-Apr-1976

## 2021-08-04 ENCOUNTER — Other Ambulatory Visit: Payer: Self-pay

## 2021-08-04 ENCOUNTER — Encounter (HOSPITAL_COMMUNITY): Payer: Self-pay

## 2021-08-04 ENCOUNTER — Encounter: Payer: Self-pay | Admitting: Orthopedic Surgery

## 2021-08-04 ENCOUNTER — Ambulatory Visit (INDEPENDENT_AMBULATORY_CARE_PROVIDER_SITE_OTHER): Payer: BC Managed Care – PPO | Admitting: Orthopedic Surgery

## 2021-08-04 ENCOUNTER — Ambulatory Visit (HOSPITAL_COMMUNITY): Payer: BC Managed Care – PPO

## 2021-08-04 DIAGNOSIS — M25511 Pain in right shoulder: Secondary | ICD-10-CM | POA: Diagnosis not present

## 2021-08-04 DIAGNOSIS — R29898 Other symptoms and signs involving the musculoskeletal system: Secondary | ICD-10-CM

## 2021-08-04 DIAGNOSIS — Z9889 Other specified postprocedural states: Secondary | ICD-10-CM

## 2021-08-04 DIAGNOSIS — M25611 Stiffness of right shoulder, not elsewhere classified: Secondary | ICD-10-CM

## 2021-08-04 MED ORDER — OXYCODONE-ACETAMINOPHEN 5-325 MG PO TABS: 1.0000 | ORAL_TABLET | Freq: Four times a day (QID) | ORAL | 0 refills | Status: DC | PRN

## 2021-08-04 NOTE — Progress Notes (Signed)
Chief Complaint  Patient presents with   Shoulder Pain    Postop- 10/21/2 ARTHROSCOPY RIGHT SHOULDER WITH DISTAL CLAVICLE EXCISION (Right) With revision rotator cuff repair right shoulder. Hurts still, ongoing PT, increased ROM, just still really hurts.    46 year old female status post third surgery on her right shoulder complains of continued pain.  She is also diabetic she is also on clopidogrel  She cannot take anti-inflammatories and her hemoglobin A1c's have been elevated and prednisone is probably contraindicated as well and we discussed the amount of inflammation she is having  We can only use opioids and although we have been able to wean her down to 4 tablets a day she still on the opioids 10 to 11 weeks postop  Her range of motion has improved as we see on her PT notes she still has some weakness in abduction and flexion  She has tenderness over the anterior shoulder where the scars are from the surgery  Recommend she try some topical medication such as Aspercreme or diclofenac gel and we refilled her medicine to see her in 4 weeks  Continue out of work for 6 weeks  Meds ordered this encounter  Medications   oxyCODONE-acetaminophen (PERCOCET/ROXICET) 5-325 MG tablet    Sig: Take 1 tablet by mouth every 6 (six) hours as needed for up to 5 days for severe pain.    Dispense:  20 tablet    Refill:  0

## 2021-08-04 NOTE — Therapy (Signed)
[place], [place], [address] Phone: [phone]   Fax:  [phone]  Occupational Therapy Treatment  Patient Details  Name: Tina Pham MRN: [id number] Date of Birth: September 01, 1975 Referring Provider (OT): Arther Abbott, MD   Encounter Date: 08/04/2021   OT End of Session - 08/04/21 1013     Visit Number 10    Number of Visits 24    Date for OT Re-Evaluation 09/22/21    Authorization Type BC[employer] Commercial    Authorization Time Period 30 visit limit. 0 used.    Authorization - Visit Number 4    Authorization - Number of Visits 30    OT Start Time 0945    OT Stop Time 3976    OT Time Calculation (min) 38 min    Activity Tolerance Patient limited by pain;Patient tolerated treatment well    Behavior During Therapy WFL for tasks assessed/performed             Past Medical History:  Diagnosis Date   Anxiety    panic attacks   Arthritis    Chronic back pain    Chronic bronchitis (HCC)    Depression    Diabetes mellitus    GERD (gastroesophageal reflux disease)    prn tums   High cholesterol    History of kidney stones    passed ?   Hypertension    no longer on BP medications   Migraine    Neuropathic pain of foot    Plantar fasciitis    Pneumonia 2011ish   Sciatica    Torn rotator cuff     Past Surgical History:  Procedure Laterality Date   ABDOMINAL AORTOGRAM W/LOWER EXTREMITY N/A 07/01/2020   Procedure: ABDOMINAL AORTOGRAM W/LOWER EXTREMITY;  Surgeon: Marty Heck, MD;  Location: [facility];  Service: Cardiovascular;  Laterality: N/A;   AMPUTATION Left 07/22/2020   Procedure: LEFT GREAT TOE AMPUTATION;  Surgeon: Waynetta Sandy, MD;  Location: [facility];  Service: Vascular;  Laterality: Left;   BACK SURGERY     herniated disc; had disc removed and then fusion; total of 3 surgeries   EYE SURGERY Bilateral    removal of cyst and straightening of muscles   KNEE  ARTHROSCOPY WITH MEDIAL MENISECTOMY Left 12/27/2016   Procedure: LEFT KNEE DIAGNOSTIC ARTHROSCOPY;  Surgeon: Carole Civil, MD;  [facility];  Service: Orthopedics;  Laterality: Left;   PERIPHERAL VASCULAR INTERVENTION Left 07/01/2020   Procedure: PERIPHERAL VASCULAR INTERVENTION;  Surgeon: Marty Heck, MD;  Location: [facility];  Service: Cardiovascular;  Laterality: Left;  common iliac   SHOULDER ARTHROSCOPY WITH DISTAL CLAVICLE RESECTION Right 05/13/2021   Procedure: ARTHROSCOPY RIGHT SHOULDER WITH DISTAL CLAVICLE EXCISION;  Surgeon: Carole Civil, MD;  Location: [facility];  Service: Orthopedics;  Laterality: Right;   SHOULDER OPEN ROTATOR CUFF REPAIR Right 09/19/2018   Procedure: ROTATOR CUFF REPAIR SHOULDER OPEN;  Surgeon: Carole Civil, MD;  Location: [facility];  Service: Orthopedics;  Laterality: Right;   SHOULDER OPEN ROTATOR CUFF REPAIR Right 08/05/2019   Procedure: ROTATOR CUFF REPAIR SHOULDER OPEN;  Surgeon: Carole Civil, MD;  Location[facility];  Service: Orthopedics;  Laterality: Right;   SHOULDER OPEN ROTATOR CUFF REPAIR Right 05/13/2021   Procedure: OPEN ROTATOR CUFF REPAIR RIGHT SHOULDER;  Surgeon: Carole Civil, MD;  Location[facility];  Service: Orthopedics;  Laterality: Right;   TUBAL LIGATION      There were  no vitals filed for this visit.   Subjective Assessment - 08/04/21 0950     Subjective  S: I'm out of work for the next 6 weeks.    Currently in Pain? Yes    Pain Score 5     Pain Location Shoulder    Pain Orientation Right    Pain Descriptors / Indicators Aching    Pain Type Acute pain    Pain Onset More than a month ago    Pain Frequency Constant    Aggravating Factors  certain movement    Pain Relieving Factors heat, pain medication    Effect of Pain on Daily Activities max effect    Multiple Pain Sites No                          OT Treatments/Exercises (OP) - 08/04/21 0959       Exercises    Exercises Shoulder      Shoulder Exercises: Supine   Protraction PROM;5 reps;AROM;10 reps    Horizontal ABduction PROM;5 reps;AROM;10 reps    External Rotation PROM;5 reps;AROM;10 reps    Internal Rotation PROM;5 reps;AROM;10 reps    Flexion PROM;5 reps;AROM;10 reps    ABduction PROM;5 reps      Shoulder Exercises: Standing   Protraction AROM;10 reps    Horizontal ABduction AROM;10 reps    External Rotation AROM;10 reps    Internal Rotation AROM;10 reps    Flexion AROM;10 reps    ABduction AROM;10 reps      Shoulder Exercises: ROM/Strengthening   UBE (Upper Arm Bike) Level 1 2' forward, pace: 3.0-3.5    Nustep 10    Wall Wash 1'    Proximal Shoulder Strengthening, Supine 10X A/ROM no rest breaks    Proximal Shoulder Strengthening, Seated 10X A/ROM no rest breaks      Functional Reaching Activities   High Level Squigz used for high level reaching task at door. All placed and removed using RUE.      Manual Therapy   Manual Therapy Myofascial release    Manual therapy comments completed separately from therapeutic exercises    Myofascial Release Myofascial release and manual stretching completed to the right upper arm, upper trapezius, and scapularis region to decrease fascial restrictions and increase joint mobility in a pain free zone.                    OT Education - 08/04/21 1012     Education Details A/ROM    Person(s) Educated Patient    Methods Explanation;Demonstration;Handout;Verbal cues    Comprehension Returned demonstration;Verbalized understanding              OT Short Term Goals - 08/04/21 1249       OT SHORT TERM GOAL #1   Title Patient will be educated and independent with HEP to faciliate progress in therapy and allow her to return to using her RUE as her dominant extremity for all daily and work activities.     Time 6    Period Weeks    Status On-going    Target Date 08/11/21      OT SHORT TERM GOAL #2   Title Patient will increase  RUE P/ROM to [facility] in order to complete upper body dressing tasks with less difficulty.    Time 6    Period Weeks      OT SHORT TERM GOAL #3   Title Pt will increase RUE strength to  3/5 in order to complete waist level tasks with less difficulty.    Time 6    Period Weeks    Status Partially Met      OT SHORT TERM GOAL #4   Title Patient will report a decrease in pain level of 5/10 when completing daily tasks with her RUE.    Time 6    Period Weeks    Status On-going      OT SHORT TERM GOAL #5   Title Patient will demonstrate fascial restrictions of min amount or less in her RUE in order to increase functional mobility needed to complete reaching tasks.    Time 6    Period Weeks               OT Long Term Goals - 07/04/21 1631       OT LONG TERM GOAL #1   Title Patient will return to highest level of independence while returning to work using her RUE for 75% or more of daily tasks.    Time 12    Period Weeks    Status On-going    Target Date 09/22/21      OT LONG TERM GOAL #2   Title Patient will increase A/ROM to [facility] in order to increase ability to reach above shoulder level and into cabinets with less difficulty.    Time 12    Period Weeks    Status On-going      OT LONG TERM GOAL #3   Title Patient will increase RUE strength to 44/5 in order to lift normal houshold items of moderate weight.    Time 12    Period Weeks    Status On-going      OT LONG TERM GOAL #4   Title Patient will report a decrease in pain of approximately 3/10 or less when using her RUE for daily tasks.    Time 12    Period Weeks    Status On-going                   Plan - 08/04/21 1138     Clinical Impression Statement A: Pt reports that MD apppointment was completed this morning. Dr. Aline Brochure would like her out of work for another 6 weeks. She will return to see him in 4 weeks. Myofascial release was completed to address fascial restrictions. Completed A/ROM supine and  standing. Updated HEP although did not provide print out to patient before she left. Will provide at next visit. VC for form and technique were provided.    Body Structure / Function / Physical Skills ADL;UE functional use;Pain;Fascial restriction;ROM;Strength;Mobility;IADL    Plan P: Provide HEP handout from last session. Overheading lacing if able to tolerate. Scapular strengthening.    OT Home Exercise Plan eval: table slides, A/ROM wrist, forearm, elbow, pendulums 12/20: AA/ROM supine - flexion, IR/er, protraction, horizontal abduction/adduction 1/12: A/ROM    Consulted and Agree with Plan of Care Patient             Patient will benefit from skilled therapeutic intervention in order to improve the following deficits and impairments:   Body Structure / Function / Physical Skills: ADL, UE functional use, Pain, Fascial restriction, ROM, Strength, Mobility, IADL       Visit Diagnosis: Acute pain of right shoulder  Other symptoms and signs involving the musculoskeletal system  Stiffness of right shoulder, not elsewhere classified    Problem List Patient Active Problem List   Diagnosis Date Noted  Arthrosis of right acromioclavicular joint    PAD (peripheral artery disease) ([place]) 07/13/2020   S/P rotator cuff surgery 10/04/2018   Complete tear of right rotator cuff    Chronic pain of left knee    Left shoulder pain 09/21/2015   Disorders of bursae and tendons in shoulder region, unspecified 09/25/2013    Almyra Birman, OTR/L,CBIS  [phone]  08/04/2021, 12:50 PM  [facility] [address] [place], [place], [address] Phone: [phone]   Fax:  [phone]  Name: NAQUISHA WHITEHAIR MRN: [id number] Date of Birth: Apr 27, 1976

## 2021-08-04 NOTE — Patient Instructions (Signed)
Oow 6 weeks   Try topical diclofenac gel or aspercreme 3 x a day

## 2021-08-04 NOTE — Patient Instructions (Addendum)
Repeat all exercises 10-15 times, 1-2 times per day.  1) Shoulder Protraction    Begin with elbows by your side, slowly "punch" straight out in front of you.      2) Shoulder Flexion  Standing:         Begin with arms at your side with thumbs pointed up, slowly raise both arms up and forward towards overhead.               3) Horizontal abduction/adduction   Standing:           Begin with arms straight out in front of you, bring out to the side in at "T" shape. Keep arms straight entire time.       4) Internal & External Rotation  Standing:     Stand with elbows at the side and elbows bent 90 degrees. Move your forearms away from your body, then bring back inward toward the body.     5) Shoulder Abduction  Standing:       Begin with your arms next to your side. Slowly move your arms out to the side so that they go overhead, in a jumping jack or snow angel movement.      

## 2021-08-09 ENCOUNTER — Encounter (HOSPITAL_COMMUNITY): Payer: BC Managed Care – PPO

## 2021-08-10 ENCOUNTER — Other Ambulatory Visit: Payer: Self-pay | Admitting: Orthopedic Surgery

## 2021-08-10 ENCOUNTER — Telehealth: Payer: Self-pay

## 2021-08-10 DIAGNOSIS — Z9889 Other specified postprocedural states: Secondary | ICD-10-CM

## 2021-08-10 MED ORDER — OXYCODONE-ACETAMINOPHEN 5-325 MG PO TABS: 1.0000 | ORAL_TABLET | Freq: Four times a day (QID) | ORAL | 0 refills | Status: DC | PRN

## 2021-08-10 NOTE — Progress Notes (Signed)
Meds ordered this encounter  °Medications  ° oxyCODONE-acetaminophen (PERCOCET/ROXICET) 5-325 MG tablet  °  Sig: Take 1 tablet by mouth every 6 (six) hours as needed for up to 5 days for severe pain.  °  Dispense:  20 tablet  °  Refill:  0  ° ° °

## 2021-08-10 NOTE — Telephone Encounter (Signed)
Pt calling requesting refill on pain medication/ Oxycodone  [facility] [address]

## 2021-08-11 ENCOUNTER — Encounter (HOSPITAL_COMMUNITY): Payer: Self-pay

## 2021-08-11 ENCOUNTER — Ambulatory Visit (HOSPITAL_COMMUNITY): Payer: BC Managed Care – PPO

## 2021-08-11 ENCOUNTER — Other Ambulatory Visit: Payer: Self-pay

## 2021-08-11 DIAGNOSIS — M25611 Stiffness of right shoulder, not elsewhere classified: Secondary | ICD-10-CM

## 2021-08-11 DIAGNOSIS — M25511 Pain in right shoulder: Secondary | ICD-10-CM | POA: Diagnosis not present

## 2021-08-11 DIAGNOSIS — R29898 Other symptoms and signs involving the musculoskeletal system: Secondary | ICD-10-CM

## 2021-08-11 NOTE — Therapy (Signed)
[name], [place], [address] Phone: [phone]   Fax:  [phone]  Occupational Therapy Treatment  Patient Details  Name: Tina Pham MRN: [id number] Date of Birth: 1975/08/30 Referring Provider (OT): Arther Abbott, MD   Encounter Date: 08/11/2021   OT End of Session - 08/11/21 1054     Visit Number 11    Number of Visits 24    Date for OT Re-Evaluation 09/22/21   mini reassess: 09/02/21   Authorization Type BCBS Commercial    Authorization Time Period 30 visit limit. 0 used.    Authorization - Visit Number 5    Authorization - Number of Visits 30    OT Start Time 1030    OT Stop Time 1108    OT Time Calculation (min) 38 min    Activity Tolerance Patient tolerated treatment well    Behavior During Therapy WFL for tasks assessed/performed             Past Medical History:  Diagnosis Date   Anxiety    panic attacks   Arthritis    Chronic back pain    Chronic bronchitis (HCC)    Depression    Diabetes mellitus    GERD (gastroesophageal reflux disease)    prn tums   High cholesterol    History of kidney stones    passed ?   Hypertension    no longer on BP medications   Migraine    Neuropathic pain of foot    Plantar fasciitis    Pneumonia 2011ish   Sciatica    Torn rotator cuff     Past Surgical History:  Procedure Laterality Date   ABDOMINAL AORTOGRAM W/LOWER EXTREMITY N/A 07/01/2020   Procedure: ABDOMINAL AORTOGRAM W/LOWER EXTREMITY;  Surgeon: Marty Heck, MD;  Location: [facility];  Service: Cardiovascular;  Laterality: N/A;   AMPUTATION Left 07/22/2020   Procedure: LEFT GREAT TOE AMPUTATION;  Surgeon: Waynetta Sandy, MD;  Location: [facility];  Service: Vascular;  Laterality: Left;   BACK SURGERY     herniated disc; had disc removed and then fusion; total of 3 surgeries   EYE SURGERY Bilateral    removal of cyst and straightening of muscles   KNEE  ARTHROSCOPY WITH MEDIAL MENISECTOMY Left 12/27/2016   Procedure: LEFT KNEE DIAGNOSTIC ARTHROSCOPY;  Surgeon: Carole Civil, MD;  [facility];  Service: Orthopedics;  Laterality: Left;   PERIPHERAL VASCULAR INTERVENTION Left 07/01/2020   Procedure: PERIPHERAL VASCULAR INTERVENTION;  Surgeon: Marty Heck, MD;  Location: [facility];  Service: Cardiovascular;  Laterality: Left;  common iliac   SHOULDER ARTHROSCOPY WITH DISTAL CLAVICLE RESECTION Right 05/13/2021   Procedure: ARTHROSCOPY RIGHT SHOULDER WITH DISTAL CLAVICLE EXCISION;  Surgeon: Carole Civil, MD;  Location: [facility];  Service: Orthopedics;  Laterality: Right;   SHOULDER OPEN ROTATOR CUFF REPAIR Right 09/19/2018   Procedure: ROTATOR CUFF REPAIR SHOULDER OPEN;  Surgeon: Carole Civil, MD;  Location: [facility];  Service: Orthopedics;  Laterality: Right;   SHOULDER OPEN ROTATOR CUFF REPAIR Right 08/05/2019   Procedure: ROTATOR CUFF REPAIR SHOULDER OPEN;  Surgeon: Carole Civil, MD;  Location: [facility];  Service: Orthopedics;  Laterality: Right;   SHOULDER OPEN ROTATOR CUFF REPAIR Right 05/13/2021   Procedure: OPEN ROTATOR CUFF REPAIR RIGHT SHOULDER;  Surgeon: Carole Civil, MD;  Location: [facility];  Service: Orthopedics;  Laterality: Right;   TUBAL LIGATION      There  were no vitals filed for this visit.   Subjective Assessment - 08/11/21 1041     Currently in Pain? Yes    Pain Score 5     Pain Location Shoulder    Pain Orientation Right    Pain Descriptors / Indicators Aching    Pain Type Acute pain    Pain Onset More than a month ago    Pain Frequency Constant    Aggravating Factors  certain movement    Pain Relieving Factors heat, pain medication    Effect of Pain on Daily Activities max effect    Multiple Pain Sites No                [facility] OT Assessment - 08/11/21 1053       Assessment   Medical Diagnosis Right RTC repair      Precautions   Precautions Shoulder    Type of  Shoulder Precautions Standard RCR protocol   Sling on 4-6 weeks   0-6 weeks: PROM, pendulum   6-12 weeks: AAROM progressing to AROM   12-24 weeks: Strengthening exercises                      OT Treatments/Exercises (OP) - 08/11/21 1050       Exercises   Exercises Shoulder      Shoulder Exercises: Supine   Protraction PROM;5 reps;AROM;15 reps    Horizontal ABduction PROM;5 reps;AROM;15 reps    External Rotation PROM;5 reps;AROM;15 reps    Internal Rotation PROM;5 reps;AROM;15 reps    Flexion PROM;5 reps;AROM;15 reps    ABduction PROM;5 reps;AROM;15 reps      Shoulder Exercises: Standing   Protraction AROM;15 reps    Horizontal ABduction AROM;15 reps    External Rotation AROM;15 reps    Internal Rotation AROM;15 reps    Flexion AROM;15 reps    ABduction AROM;15 reps      Shoulder Exercises: ROM/Strengthening   Proximal Shoulder Strengthening, Supine 15X A/ROM no rest breaks    Proximal Shoulder Strengthening, Seated 15X A/ROM no rest breaks      Manual Therapy   Manual Therapy Myofascial release    Manual therapy comments completed separately from therapeutic exercises    Myofascial Release Myofascial release and manual stretching completed to the right upper arm, upper trapezius, and scapularis region to decrease fascial restrictions and increase joint mobility in a pain free zone.                      OT Short Term Goals - 08/04/21 1249       OT SHORT TERM GOAL #1   Title Patient will be educated and independent with HEP to faciliate progress in therapy and allow her to return to using her RUE as her dominant extremity for all daily and work activities.     Time 6    Period Weeks    Status On-going    Target Date 08/11/21      OT SHORT TERM GOAL #2   Title Patient will increase RUE P/ROM to [facility] in order to complete upper body dressing tasks with less difficulty.    Time 6    Period Weeks      OT SHORT TERM GOAL #3   Title Pt will increase  RUE strength to 3/5 in order to complete waist level tasks with less difficulty.    Time 6    Period Weeks    Status Partially Met  OT SHORT TERM GOAL #4   Title Patient will report a decrease in pain level of 5/10 when completing daily tasks with her RUE.    Time 6    Period Weeks    Status On-going      OT SHORT TERM GOAL #5   Title Patient will demonstrate fascial restrictions of min amount or less in her RUE in order to increase functional mobility needed to complete reaching tasks.    Time 6    Period Weeks               OT Long Term Goals - 07/04/21 1631       OT LONG TERM GOAL #1   Title Patient will return to highest level of independence while returning to work using her RUE for 75% or more of daily tasks.    Time 12    Period Weeks    Status On-going    Target Date 09/22/21      OT LONG TERM GOAL #2   Title Patient will increase A/ROM to [facility] in order to increase ability to reach above shoulder level and into cabinets with less difficulty.    Time 12    Period Weeks    Status On-going      OT LONG TERM GOAL #3   Title Patient will increase RUE strength to 44/5 in order to lift normal houshold items of moderate weight.    Time 12    Period Weeks    Status On-going      OT LONG TERM GOAL #4   Title Patient will report a decrease in pain of approximately 3/10 or less when using her RUE for daily tasks.    Time 12    Period Weeks    Status On-going                   Plan - 08/11/21 1112     Clinical Impression Statement A: Completed myofascial release to address fascial restrictions in the right upper arm and upper trapezius. patient with trace fascial restrictions this session. Able to achieve full P/ROM and functional to full A/ROM. Completed A/ROM while increasing repetitions. VC for form and technique. Pt reports continuing to experience a grinding sensation in her [facility] although voices a lesser pain level during ROM.    Body Structure  / Function / Physical Skills ADL;UE functional use;Pain;Fascial restriction;ROM;Strength;Mobility;IADL    Plan P: Add scapular strengthening and overhead lacing.    Consulted and Agree with Plan of Care Patient             Patient will benefit from skilled therapeutic intervention in order to improve the following deficits and impairments:   Body Structure / Function / Physical Skills: ADL, UE functional use, Pain, Fascial restriction, ROM, Strength, Mobility, IADL       Visit Diagnosis: Acute pain of right shoulder  Stiffness of right shoulder, not elsewhere classified  Other symptoms and signs involving the musculoskeletal system    Problem List Patient Active Problem List   Diagnosis Date Noted   Arthrosis of right acromioclavicular joint    PAD (peripheral artery disease) ([place]) 07/13/2020   S/P rotator cuff surgery 10/04/2018   Complete tear of right rotator cuff    Chronic pain of left knee    Left shoulder pain 09/21/2015   Disorders of bursae and tendons in shoulder region, unspecified 09/25/2013    Ailene Ravel, OTR/L,CBIS  [phone]  08/11/2021, 11:15 AM  [facility]  [place], [place], [address] Phone: [phone]   Fax:  [phone]  Name: HENNESSY BARTEL MRN: [id number] Date of Birth: 1976-05-21

## 2021-08-15 ENCOUNTER — Encounter (HOSPITAL_COMMUNITY): Payer: Self-pay

## 2021-08-15 ENCOUNTER — Other Ambulatory Visit: Payer: Self-pay

## 2021-08-15 ENCOUNTER — Other Ambulatory Visit: Payer: Self-pay | Admitting: Orthopedic Surgery

## 2021-08-15 ENCOUNTER — Ambulatory Visit (HOSPITAL_COMMUNITY): Payer: BC Managed Care – PPO

## 2021-08-15 DIAGNOSIS — M25611 Stiffness of right shoulder, not elsewhere classified: Secondary | ICD-10-CM

## 2021-08-15 DIAGNOSIS — R29898 Other symptoms and signs involving the musculoskeletal system: Secondary | ICD-10-CM

## 2021-08-15 DIAGNOSIS — M25511 Pain in right shoulder: Secondary | ICD-10-CM

## 2021-08-15 DIAGNOSIS — Z9889 Other specified postprocedural states: Secondary | ICD-10-CM

## 2021-08-15 MED ORDER — OXYCODONE-ACETAMINOPHEN 5-325 MG PO TABS: 1.0000 | ORAL_TABLET | Freq: Four times a day (QID) | ORAL | 0 refills | Status: DC | PRN

## 2021-08-15 NOTE — Therapy (Signed)
[place], [place], [address] Phone: [phone]   Fax:  [phone]  Occupational Therapy Treatment  Patient Details  Name: Tina Pham MRN: [id number] Date of Birth: 1976-05-27 Referring Provider (OT): Arther Abbott, MD   Encounter Date: 08/15/2021   OT End of Session - 08/15/21 1646     Visit Number 12    Number of Visits 24    Date for OT Re-Evaluation 09/22/21   mini reassess: 09/02/21   Authorization Type [employer] Commercial    Authorization Time Period 30 visit limit. 0 used.    Authorization - Visit Number 6    Authorization - Number of Visits 30    OT Start Time 1600    OT Stop Time 1638    OT Time Calculation (min) 38 min    Activity Tolerance Patient tolerated treatment well    Behavior During Therapy WFL for tasks assessed/performed             Past Medical History:  Diagnosis Date   Anxiety    panic attacks   Arthritis    Chronic back pain    Chronic bronchitis (HCC)    Depression    Diabetes mellitus    GERD (gastroesophageal reflux disease)    prn tums   High cholesterol    History of kidney stones    passed ?   Hypertension    no longer on BP medications   Migraine    Neuropathic pain of foot    Plantar fasciitis    Pneumonia 2011ish   Sciatica    Torn rotator cuff     Past Surgical History:  Procedure Laterality Date   ABDOMINAL AORTOGRAM W/LOWER EXTREMITY N/A 07/01/2020   Procedure: ABDOMINAL AORTOGRAM W/LOWER EXTREMITY;  Surgeon: Marty Heck, MD;  Location: [facility];  Service: Cardiovascular;  Laterality: N/A;   AMPUTATION Left 07/22/2020   Procedure: LEFT GREAT TOE AMPUTATION;  Surgeon: Waynetta Sandy, MD;  Location: [facility];  Service: Vascular;  Laterality: Left;   BACK SURGERY     herniated disc; had disc removed and then fusion; total of 3 surgeries   EYE SURGERY Bilateral    removal of cyst and straightening of muscles   KNEE  ARTHROSCOPY WITH MEDIAL MENISECTOMY Left 12/27/2016   Procedure: LEFT KNEE DIAGNOSTIC ARTHROSCOPY;  Surgeon: Carole Civil, MD;  Location: [facility];  Service: Orthopedics;  Laterality: Left;   PERIPHERAL VASCULAR INTERVENTION Left 07/01/2020   Procedure: PERIPHERAL VASCULAR INTERVENTION;  Surgeon: Marty Heck, MD;  Location: [facility];  Service: Cardiovascular;  Laterality: Left;  common iliac   SHOULDER ARTHROSCOPY WITH DISTAL CLAVICLE RESECTION Right 05/13/2021   Procedure: ARTHROSCOPY RIGHT SHOULDER WITH DISTAL CLAVICLE EXCISION;  Surgeon: Carole Civil, MD;  Location: [facility];  Service: Orthopedics;  Laterality: Right;   SHOULDER OPEN ROTATOR CUFF REPAIR Right 09/19/2018   Procedure: ROTATOR CUFF REPAIR SHOULDER OPEN;  Surgeon: Carole Civil, MD;  Location: [facility];  Service: Orthopedics;  Laterality: Right;   SHOULDER OPEN ROTATOR CUFF REPAIR Right 08/05/2019   Procedure: ROTATOR CUFF REPAIR SHOULDER OPEN;  Surgeon: Carole Civil, MD;  Location: [facility];  Service: Orthopedics;  Laterality: Right;   SHOULDER OPEN ROTATOR CUFF REPAIR Right 05/13/2021   Procedure: OPEN ROTATOR CUFF REPAIR RIGHT SHOULDER;  Surgeon: Carole Civil, MD;  Location: [facility];  Service: Orthopedics;  Laterality: Right;   TUBAL LIGATION      There  were no vitals filed for this visit.   Subjective Assessment - 08/15/21 1605     Currently in Pain? Yes    Pain Score 4     Pain Location Shoulder    Pain Orientation Right    Pain Descriptors / Indicators Aching    Pain Type Acute pain    Pain Onset More than a month ago    Pain Frequency Constant    Aggravating Factors  certain movements    Pain Relieving Factors heat, pain medication    Effect of Pain on Daily Activities moderate effect    Multiple Pain Sites No                [facility] OT Assessment - 08/15/21 1612       Assessment   Medical Diagnosis Right RTC repair    Next MD Visit 09/01/21      Precautions    Precautions Shoulder    Type of Shoulder Precautions Standard RCR protocol   Sling on 4-6 weeks   0-6 weeks: PROM, pendulum   6-12 weeks: AAROM progressing to AROM   12-24 weeks: Strengthening exercises                      OT Treatments/Exercises (OP) - 08/15/21 1613       Exercises   Exercises Shoulder      Shoulder Exercises: Supine   Protraction PROM;5 reps    Horizontal ABduction PROM;5 reps;AROM;15 reps    External Rotation PROM;5 reps    Internal Rotation PROM;5 reps    Flexion PROM;5 reps;AROM;15 reps    ABduction PROM;5 reps;AROM;15 reps      Shoulder Exercises: Standing   Horizontal ABduction AROM;15 reps    External Rotation AROM;15 reps    Internal Rotation AROM;15 reps    ABduction AROM;15 reps    Extension Theraband;10 reps    Theraband Level (Shoulder Extension) Level 2 (Red)    Row Theraband;10 reps    Theraband Level (Shoulder Row) Level 2 (Red)      Shoulder Exercises: Therapy Ball   Other Therapy Ball Exercises green therapy ball chest press 12X, circles 10X each direction, flexion 10X      Shoulder Exercises: ROM/Strengthening   UBE (Upper Arm Bike) level 1 2' forward 2' reverse pace: 4.5-5.0    Over Head Lace seated; started at top of chain lacing to the bottom. Removed tubing from chain once completed.    Proximal Shoulder Strengthening, Supine 15X A/ROM no rest breaks    Proximal Shoulder Strengthening, Seated 15X A/ROM no rest breaks    Other ROM/Strengthening Exercises proximal shoulder strengthening using washcloth at door; shoulder flexed at 90 degrees. 1'      Manual Therapy   Manual Therapy Myofascial release    Manual therapy comments completed separately from therapeutic exercises    Myofascial Release Myofascial release and manual stretching completed to the right upper arm, upper trapezius, and scapularis region to decrease fascial restrictions and increase joint mobility in a pain free zone.                       OT Short Term Goals - 08/04/21 1249       OT SHORT TERM GOAL #1   Title Patient will be educated and independent with HEP to faciliate progress in therapy and allow her to return to using her RUE as her dominant extremity for all daily and work activities.  Time 6    Period Weeks    Status On-going    Target Date 08/11/21      OT SHORT TERM GOAL #2   Title Patient will increase RUE P/ROM to [facility] in order to complete upper body dressing tasks with less difficulty.    Time 6    Period Weeks      OT SHORT TERM GOAL #3   Title Pt will increase RUE strength to 3/5 in order to complete waist level tasks with less difficulty.    Time 6    Period Weeks    Status Partially Met      OT SHORT TERM GOAL #4   Title Patient will report a decrease in pain level of 5/10 when completing daily tasks with her RUE.    Time 6    Period Weeks    Status On-going      OT SHORT TERM GOAL #5   Title Patient will demonstrate fascial restrictions of min amount or less in her RUE in order to increase functional mobility needed to complete reaching tasks.    Time 6    Period Weeks               OT Long Term Goals - 07/04/21 1631       OT LONG TERM GOAL #1   Title Patient will return to highest level of independence while returning to work using her RUE for 75% or more of daily tasks.    Time 12    Period Weeks    Status On-going    Target Date 09/22/21      OT LONG TERM GOAL #2   Title Patient will increase A/ROM to [facility] in order to increase ability to reach above shoulder level and into cabinets with less difficulty.    Time 12    Period Weeks    Status On-going      OT LONG TERM GOAL #3   Title Patient will increase RUE strength to 44/5 in order to lift normal houshold items of moderate weight.    Time 12    Period Weeks    Status On-going      OT LONG TERM GOAL #4   Title Patient will report a decrease in pain of approximately 3/10 or less when using her RUE for daily tasks.     Time 12    Period Weeks    Status On-going                   Plan - 08/15/21 1647     Clinical Impression Statement A: Min to trace fascial restrictions noted in right upper arm and upper trapezius region. Able to demonstrate functional to full A/ROM this session. Added therapy ball strengthening and overhead lacing. Pt demonstrates muscle fatigue and soreness during session. VC for form and technique were provided.    Body Structure / Function / Physical Skills ADL;UE functional use;Pain;Fascial restriction;ROM;Strength;Mobility;IADL    Plan P: D/C myofascial release. Continue to progress as able to tolerate. Attempt 1# hand weight supine if able to tolerate. Update HEP to include scapular strengthening.    Consulted and Agree with Plan of Care Patient             Patient will benefit from skilled therapeutic intervention in order to improve the following deficits and impairments:   Body Structure / Function / Physical Skills: ADL, UE functional use, Pain, Fascial restriction, ROM, Strength, Mobility, IADL  Visit Diagnosis: Acute pain of right shoulder  Stiffness of right shoulder, not elsewhere classified  Other symptoms and signs involving the musculoskeletal system    Problem List Patient Active Problem List   Diagnosis Date Noted   Arthrosis of right acromioclavicular joint    PAD (peripheral artery disease) ([place]) 07/13/2020   S/P rotator cuff surgery 10/04/2018   Complete tear of right rotator cuff    Chronic pain of left knee    Left shoulder pain 09/21/2015   Disorders of bursae and tendons in shoulder region, unspecified 09/25/2013    Tina Pham, OTR/L,CBIS  [phone]  08/15/2021, 4:49 PM  [place], [place], [address] Phone: [phone]   Fax:  [phone]  Name: Tina Pham MRN: [id number] Date of Birth: 07-Apr-1976

## 2021-08-16 ENCOUNTER — Encounter (HOSPITAL_COMMUNITY): Payer: BC Managed Care – PPO

## 2021-08-17 ENCOUNTER — Ambulatory Visit (HOSPITAL_COMMUNITY): Payer: BC Managed Care – PPO

## 2021-08-17 ENCOUNTER — Other Ambulatory Visit: Payer: Self-pay

## 2021-08-17 ENCOUNTER — Encounter (HOSPITAL_COMMUNITY): Payer: Self-pay

## 2021-08-17 DIAGNOSIS — M25511 Pain in right shoulder: Secondary | ICD-10-CM

## 2021-08-17 DIAGNOSIS — R29898 Other symptoms and signs involving the musculoskeletal system: Secondary | ICD-10-CM

## 2021-08-17 DIAGNOSIS — M25611 Stiffness of right shoulder, not elsewhere classified: Secondary | ICD-10-CM

## 2021-08-17 NOTE — Therapy (Signed)
[place], [place], [address] Phone: [phone]   Fax:  [phone]  Occupational Therapy Treatment  Patient Details  Name: Tina Pham MRN: [id number] Date of Birth: 1976/01/09 Referring Provider (OT): Arther Abbott, MD   Encounter Date: 08/17/2021   OT End of Session - 08/17/21 1534     Visit Number 13    Number of Visits 24    Date for OT Re-Evaluation 09/22/21   mini reassess: 09/02/21   Authorization Type [employer] Commercial    Authorization Time Period 30 visit limit. 0 used.    Authorization - Visit Number 7    Authorization - Number of Visits 30    OT Start Time 2505   Pt checked in late   OT Stop Time 1559    OT Time Calculation (min) 34 min    Activity Tolerance Patient tolerated treatment well    Behavior During Therapy WFL for tasks assessed/performed             Past Medical History:  Diagnosis Date   Anxiety    panic attacks   Arthritis    Chronic back pain    Chronic bronchitis (HCC)    Depression    Diabetes mellitus    GERD (gastroesophageal reflux disease)    prn tums   High cholesterol    History of kidney stones    passed ?   Hypertension    no longer on BP medications   Migraine    Neuropathic pain of foot    Plantar fasciitis    Pneumonia 2011ish   Sciatica    Torn rotator cuff     Past Surgical History:  Procedure Laterality Date   ABDOMINAL AORTOGRAM W/LOWER EXTREMITY N/A 07/01/2020   Procedure: ABDOMINAL AORTOGRAM W/LOWER EXTREMITY;  Surgeon: Marty Heck, MD;  Location: [facility];  Service: Cardiovascular;  Laterality: N/A;   AMPUTATION Left 07/22/2020   Procedure: LEFT GREAT TOE AMPUTATION;  Surgeon: Waynetta Sandy, MD;  Location: [facility];  Service: Vascular;  Laterality: Left;   BACK SURGERY     herniated disc; had disc removed and then fusion; total of 3 surgeries   EYE SURGERY Bilateral    removal of cyst and straightening  of muscles   KNEE ARTHROSCOPY WITH MEDIAL MENISECTOMY Left 12/27/2016   Procedure: LEFT KNEE DIAGNOSTIC ARTHROSCOPY;  Surgeon: Carole Civil, MD;  Location: [facility];  Service: Orthopedics;  Laterality: Left;   PERIPHERAL VASCULAR INTERVENTION Left 07/01/2020   Procedure: PERIPHERAL VASCULAR INTERVENTION;  Surgeon: Marty Heck, MD;  Location: [facility];  Service: Cardiovascular;  Laterality: Left;  common iliac   SHOULDER ARTHROSCOPY WITH DISTAL CLAVICLE RESECTION Right 05/13/2021   Procedure: ARTHROSCOPY RIGHT SHOULDER WITH DISTAL CLAVICLE EXCISION;  Surgeon: Carole Civil, MD;  Location: [facility];  Service: Orthopedics;  Laterality: Right;   SHOULDER OPEN ROTATOR CUFF REPAIR Right 09/19/2018   Procedure: ROTATOR CUFF REPAIR SHOULDER OPEN;  Surgeon: Carole Civil, MD;  Location: [facility];  Service: Orthopedics;  Laterality: Right;   SHOULDER OPEN ROTATOR CUFF REPAIR Right 08/05/2019   Procedure: ROTATOR CUFF REPAIR SHOULDER OPEN;  Surgeon: Carole Civil, MD;  Location: [facility];  Service: Orthopedics;  Laterality: Right;   SHOULDER OPEN ROTATOR CUFF REPAIR Right 05/13/2021   Procedure: OPEN ROTATOR CUFF REPAIR RIGHT SHOULDER;  Surgeon: Carole Civil, MD;  Location: [facility];  Service: Orthopedics;  Laterality: Right;   TUBAL LIGATION  There were no vitals filed for this visit.   Subjective Assessment - 08/17/21 1527     Subjective  S: The weather is not my friend.    Currently in Pain? Yes    Pain Score 5     Pain Location Shoulder    Pain Orientation Right    Pain Descriptors / Indicators Aching    Pain Type Acute pain    Pain Onset More than a month ago    Pain Frequency Constant    Aggravating Factors  certain movements    Pain Relieving Factors heat, pain medication    Effect of Pain on Daily Activities moderate effect    Multiple Pain Sites No                [facility] OT Assessment - 08/17/21 1530       Assessment   Medical Diagnosis  Right RTC repair      Precautions   Precautions Shoulder    Type of Shoulder Precautions Standard RCR protocol   Sling on 4-6 weeks   0-6 weeks: PROM, pendulum   6-12 weeks: AAROM progressing to AROM   12-24 weeks: Strengthening exercises                      OT Treatments/Exercises (OP) - 08/17/21 1530       Exercises   Exercises Shoulder      Shoulder Exercises: Supine   Protraction PROM;5 reps;Strengthening;10 reps    Protraction Weight (lbs) 2    Horizontal ABduction PROM;5 reps;Strengthening;10 reps    Horizontal ABduction Weight (lbs) 2    External Rotation PROM;5 reps;Strengthening;10 reps    External Rotation Weight (lbs) 2    Internal Rotation PROM;5 reps;Strengthening;10 reps    Internal Rotation Weight (lbs) 2    Flexion PROM;5 reps;Strengthening;10 reps    Shoulder Flexion Weight (lbs) 2    ABduction PROM;5 reps;Strengthening;10 reps    Shoulder ABduction Weight (lbs) 2      Shoulder Exercises: Standing   Protraction Strengthening;10 reps    Protraction Weight (lbs) 2    Horizontal ABduction Strengthening;10 reps    Horizontal ABduction Weight (lbs) 2    External Rotation Strengthening;10 reps    External Rotation Weight (lbs) 2    Internal Rotation Strengthening;10 reps    Internal Rotation Weight (lbs) 2    Flexion Strengthening;10 reps    Shoulder Flexion Weight (lbs) 2    ABduction Strengthening;10 reps    Shoulder ABduction Weight (lbs) 2      Shoulder Exercises: ROM/Strengthening   X to V Arms 10X with 1#    Proximal Shoulder Strengthening, Supine 10X 2# no rest breaks    Proximal Shoulder Strengthening, Seated 10X 1# no rest breaks      Shoulder Exercises: Stretch   Internal Rotation Stretch 2 reps 10" hold  towel horizontal                   OT Education - 08/17/21 1609     Education Details internal rotation stretch    Person(s) Educated Patient    Methods Explanation;Demonstration;Handout;Verbal cues    Comprehension  Returned demonstration;Verbalized understanding              OT Short Term Goals - 08/04/21 1249       OT SHORT TERM GOAL #1   Title Patient will be educated and independent with HEP to faciliate progress in therapy and allow her to return to using her  RUE as her dominant extremity for all daily and work activities.     Time 6    Period Weeks    Status On-going    Target Date 08/11/21      OT SHORT TERM GOAL #2   Title Patient will increase RUE P/ROM to [facility] in order to complete upper body dressing tasks with less difficulty.    Time 6    Period Weeks      OT SHORT TERM GOAL #3   Title Pt will increase RUE strength to 3/5 in order to complete waist level tasks with less difficulty.    Time 6    Period Weeks    Status Partially Met      OT SHORT TERM GOAL #4   Title Patient will report a decrease in pain level of 5/10 when completing daily tasks with her RUE.    Time 6    Period Weeks    Status On-going      OT SHORT TERM GOAL #5   Title Patient will demonstrate fascial restrictions of min amount or less in her RUE in order to increase functional mobility needed to complete reaching tasks.    Time 6    Period Weeks               OT Long Term Goals - 07/04/21 1631       OT LONG TERM GOAL #1   Title Patient will return to highest level of independence while returning to work using her RUE for 75% or more of daily tasks.    Time 12    Period Weeks    Status On-going    Target Date 09/22/21      OT LONG TERM GOAL #2   Title Patient will increase A/ROM to [facility] in order to increase ability to reach above shoulder level and into cabinets with less difficulty.    Time 12    Period Weeks    Status On-going      OT LONG TERM GOAL #3   Title Patient will increase RUE strength to 44/5 in order to lift normal houshold items of moderate weight.    Time 12    Period Weeks    Status On-going      OT LONG TERM GOAL #4   Title Patient will report a decrease in pain of  approximately 3/10 or less when using her RUE for daily tasks.    Time 12    Period Weeks    Status On-going                   Plan - 08/17/21 1535     Clinical Impression Statement A: Able to achieve full passive ROM this session. Continues to have a grinding session when at the high end of abduction range. Pain felt during exercises is described as more pulling. Progressed to light strengthening using 1-2lb hand weights. VC for form and technique were provided. Cues on how to stay active in the shoulder in order to work on stability versus the joint being passive. Pt was able to make adjustment when cued.    Body Structure / Function / Physical Skills ADL;UE functional use;Pain;Fascial restriction;ROM;Strength;Mobility;IADL    Plan P: stargazer stretch. Work on internal and external rotation. (ball pass around head and waist. large therapy ball circles) Update HEP to increase shoulder strengthening using 1#-2# hand weights.    OT Home Exercise Plan eval: table slides, A/ROM wrist, forearm, elbow, pendulums 12/20:  AA/ROM supine - flexion, IR/er, protraction, horizontal abduction/adduction 1/12: A/ROM 1/25: internal rotation stretch    Consulted and Agree with Plan of Care Patient             Patient will benefit from skilled therapeutic intervention in order to improve the following deficits and impairments:   Body Structure / Function / Physical Skills: ADL, UE functional use, Pain, Fascial restriction, ROM, Strength, Mobility, IADL       Visit Diagnosis: Acute pain of right shoulder  Stiffness of right shoulder, not elsewhere classified  Other symptoms and signs involving the musculoskeletal system    Problem List Patient Active Problem List   Diagnosis Date Noted   Arthrosis of right acromioclavicular joint    PAD (peripheral artery disease) ([place]) 07/13/2020   S/P rotator cuff surgery 10/04/2018   Complete tear of right rotator cuff    Chronic pain of left knee     Left shoulder pain 09/21/2015   Disorders of bursae and tendons in shoulder region, unspecified 09/25/2013    Karolyna Bianchini, OTR/L,CBIS  [phone]  08/17/2021, 4:20 PM  [place], [place], [address] Phone: [phone]   Fax:  [phone]  Name: SHAWNEEN DEETZ MRN: [id number] Date of Birth: 1975-11-28

## 2021-08-17 NOTE — Patient Instructions (Signed)
Complete the following exercises 2-3 times a day.    Internal Rotation Across Back  Grab the end of a towel with your affected side, palm facing backwards. Grab the towel with your unaffected side and pull your affected hand across your back until you feel a stretch in the front of your shoulder. If you feel pain, pull just to the pain, do not pull through the pain. Hold. Return your affected arm to your side. Try to keep your hand/arm close to your body during the entire movement.     Hold for 20-30 seconds. Complete 2 times.

## 2021-08-18 ENCOUNTER — Encounter (HOSPITAL_COMMUNITY): Payer: BC Managed Care – PPO

## 2021-08-18 ENCOUNTER — Other Ambulatory Visit: Payer: Self-pay | Admitting: Orthopedic Surgery

## 2021-08-18 DIAGNOSIS — Z9889 Other specified postprocedural states: Secondary | ICD-10-CM

## 2021-08-19 MED ORDER — OXYCODONE-ACETAMINOPHEN 5-325 MG PO TABS: 1.0000 | ORAL_TABLET | Freq: Four times a day (QID) | ORAL | 0 refills | Status: DC | PRN

## 2021-08-22 ENCOUNTER — Encounter (HOSPITAL_COMMUNITY): Payer: BC Managed Care – PPO

## 2021-08-24 ENCOUNTER — Encounter (HOSPITAL_COMMUNITY): Payer: Self-pay

## 2021-08-24 ENCOUNTER — Ambulatory Visit (HOSPITAL_COMMUNITY): Payer: BC Managed Care – PPO | Attending: Orthopedic Surgery

## 2021-08-24 ENCOUNTER — Other Ambulatory Visit: Payer: Self-pay

## 2021-08-24 DIAGNOSIS — M25511 Pain in right shoulder: Secondary | ICD-10-CM | POA: Diagnosis not present

## 2021-08-24 DIAGNOSIS — R29898 Other symptoms and signs involving the musculoskeletal system: Secondary | ICD-10-CM | POA: Diagnosis present

## 2021-08-24 DIAGNOSIS — M25611 Stiffness of right shoulder, not elsewhere classified: Secondary | ICD-10-CM | POA: Diagnosis present

## 2021-08-24 NOTE — Therapy (Signed)
[place], [place], [address] Phone: [phone]   Fax:  [phone]  Occupational Therapy Treatment  Patient Details  Name: Tina Pham MRN: [id number] Date of Birth: 1975-10-26 Referring Provider (OT): Arther Abbott, MD   Encounter Date: 08/24/2021   OT End of Session - 08/24/21 1605     Visit Number 14    Number of Visits 24    Date for OT Re-Evaluation 09/22/21   mini reassess: 09/02/21   Authorization Type BCBS Commercial    Authorization Time Period 30 visit limit. 0 used.    Authorization - Visit Number 8    Authorization - Number of Visits 20    OT Start Time 5056   Pt arrived late   OT Stop Time 59    OT Time Calculation (min) 26 min    Activity Tolerance Patient tolerated treatment well    Behavior During Therapy WFL for tasks assessed/performed             Past Medical History:  Diagnosis Date   Anxiety    panic attacks   Arthritis    Chronic back pain    Chronic bronchitis (HCC)    Depression    Diabetes mellitus    GERD (gastroesophageal reflux disease)    prn tums   High cholesterol    History of kidney stones    passed ?   Hypertension    no longer on BP medications   Migraine    Neuropathic pain of foot    Plantar fasciitis    Pneumonia 2011ish   Sciatica    Torn rotator cuff     Past Surgical History:  Procedure Laterality Date   ABDOMINAL AORTOGRAM W/LOWER EXTREMITY N/A 07/01/2020   Procedure: ABDOMINAL AORTOGRAM W/LOWER EXTREMITY;  Surgeon: Marty Heck, MD;  Location: [facility];  Service: Cardiovascular;  Laterality: N/A;   AMPUTATION Left 07/22/2020   Procedure: LEFT GREAT TOE AMPUTATION;  Surgeon: Waynetta Sandy, MD;  Location: [facility];  Service: Vascular;  Laterality: Left;   BACK SURGERY     herniated disc; had disc removed and then fusion; total of 3 surgeries   EYE SURGERY Bilateral    removal of cyst and straightening of  muscles   KNEE ARTHROSCOPY WITH MEDIAL MENISECTOMY Left 12/27/2016   Procedure: LEFT KNEE DIAGNOSTIC ARTHROSCOPY;  Surgeon: Carole Civil, MD;  [facility]: [facility];  Service: Orthopedics;  Laterality: Left;   PERIPHERAL VASCULAR INTERVENTION Left 07/01/2020   Procedure: PERIPHERAL VASCULAR INTERVENTION;  Surgeon: Marty Heck, MD;  Location: [facility];  Service: Cardiovascular;  Laterality: Left;  common iliac   SHOULDER ARTHROSCOPY WITH DISTAL CLAVICLE RESECTION Right 05/13/2021   Procedure: ARTHROSCOPY RIGHT SHOULDER WITH DISTAL CLAVICLE EXCISION;  Surgeon: Carole Civil, MD;  Location: [facility];  Service: Orthopedics;  Laterality: Right;   SHOULDER OPEN ROTATOR CUFF REPAIR Right 09/19/2018   Procedure: ROTATOR CUFF REPAIR SHOULDER OPEN;  Surgeon: Carole Civil, MD;  Location: [facility];  Service: Orthopedics;  Laterality: Right;   SHOULDER OPEN ROTATOR CUFF REPAIR Right 08/05/2019   Procedure: ROTATOR CUFF REPAIR SHOULDER OPEN;  Surgeon: Carole Civil, MD;  [facility];  Service: Orthopedics;  Laterality: Right;   SHOULDER OPEN ROTATOR CUFF REPAIR Right 05/13/2021   Procedure: OPEN ROTATOR CUFF REPAIR RIGHT SHOULDER;  Surgeon: Carole Civil, MD;  [facility];  Service: Orthopedics;  Laterality: Right;   TUBAL LIGATION  There were no vitals filed for this visit.   Subjective Assessment - 08/24/21 1044     Subjective  S: I'll have some days were the pain is a 3 but then there are days when it's higher.    Currently in Pain? Yes    Pain Score 5     Pain Location Shoulder    Pain Orientation Right    Pain Descriptors / Indicators Aching    Pain Type Acute pain    Pain Onset More than a month ago    Pain Frequency Constant    Aggravating Factors  certain movement, weather    Pain Relieving Factors heat, pain medication    Effect of Pain on Daily Activities moderate effect    Multiple Pain Sites No                [facility] OT  Assessment - 08/24/21 1047       Assessment   Medical Diagnosis Right RTC repair      Precautions   Precautions Shoulder    Type of Shoulder Precautions Standard RCR protocol   Sling on 4-6 weeks   0-6 weeks: PROM, pendulum   6-12 weeks: AAROM progressing to AROM   12-24 weeks: Strengthening exercises                      OT Treatments/Exercises (OP) - 08/24/21 1048       Exercises   Exercises Shoulder      Shoulder Exercises: Supine   External Rotation AROM;10 reps   abducted   Internal Rotation AROM;10 reps   abducted   Flexion AROM;5 reps    Other Supine Exercises A/ROM french doors; 10X      Shoulder Exercises: Standing   Other Standing Exercises tennis ball passes around head, 10X each direction      Shoulder Exercises: Therapy Ball   Right/Left 5 reps   both directions     Shoulder Exercises: Stretch   Internal Rotation Stretch 2 reps   horizontal towel   Star Gazer Stretch 2 reps;20 seconds   supine   Other Shoulder Stretches standing sleeper stretch; 2x20"      Manual Therapy   Manual Therapy Other (comment)    Manual therapy comments completed separately from therapeutic exercises    Other Manual Therapy Patient educated and demonstrated understanding of self myofascial release to anterior shoulder region to address facial restrictions and trigger points.                      OT Short Term Goals - 08/04/21 1249       OT SHORT TERM GOAL #1   Title Patient will be educated and independent with HEP to faciliate progress in therapy and allow her to return to using her RUE as her dominant extremity for all daily and work activities.     Time 6    Period Weeks    Status On-going    Target Date 08/11/21      OT SHORT TERM GOAL #2   Title Patient will increase RUE P/ROM to [facility] in order to complete upper body dressing tasks with less difficulty.    Time 6    Period Weeks      OT SHORT TERM GOAL #3   Title Pt will increase RUE strength  to 3/5 in order to complete waist level tasks with less difficulty.    Time 6    Period Weeks  Status Partially Met      OT SHORT TERM GOAL #4   Title Patient will report a decrease in pain level of 5/10 when completing daily tasks with her RUE.    Time 6    Period Weeks    Status On-going      OT SHORT TERM GOAL #5   Title Patient will demonstrate fascial restrictions of min amount or less in her RUE in order to increase functional mobility needed to complete reaching tasks.    Time 6    Period Weeks               OT Long Term Goals - 07/04/21 1631       OT LONG TERM GOAL #1   Title Patient will return to highest level of independence while returning to work using her RUE for 75% or more of daily tasks.    Time 12    Period Weeks    Status On-going    Target Date 09/22/21      OT LONG TERM GOAL #2   Title Patient will increase A/ROM to [facility] in order to increase ability to reach above shoulder level and into cabinets with less difficulty.    Time 12    Period Weeks    Status On-going      OT LONG TERM GOAL #3   Title Patient will increase RUE strength to 44/5 in order to lift normal houshold items of moderate weight.    Time 12    Period Weeks    Status On-going      OT LONG TERM GOAL #4   Title Patient will report a decrease in pain of approximately 3/10 or less when using her RUE for daily tasks.    Time 12    Period Weeks    Status On-going                   Plan - 08/24/21 1606     Clinical Impression Statement A: Focused session on internal and external rotation. Completed a variety of stretches both supine and standing. Provided tennis ball with education on how to complete self trigger point release to the anterior shoulder region. Reports pain primarily located at bicep tendon. VC for form and technique were provided during session.    Body Structure / Function / Physical Skills ADL;UE functional use;Pain;Fascial  restriction;ROM;Strength;Mobility;IADL    Plan P:Complete mini reassessment/take measurements for MD appointment on 2/9.  resume strengthening. Update HEP to increase shoulder strengthening using 1-2# hand weights.    Consulted and Agree with Plan of Care Patient             Patient will benefit from skilled therapeutic intervention in order to improve the following deficits and impairments:   Body Structure / Function / Physical Skills: ADL, UE functional use, Pain, Fascial restriction, ROM, Strength, Mobility, IADL       Visit Diagnosis: Acute pain of right shoulder  Other symptoms and signs involving the musculoskeletal system  Stiffness of right shoulder, not elsewhere classified    Problem List Patient Active Problem List   Diagnosis Date Noted   Arthrosis of right acromioclavicular joint    PAD (peripheral artery disease) ([facility]) 07/13/2020   S/P rotator cuff surgery 10/04/2018   Complete tear of right rotator cuff    Chronic pain of left knee    Left shoulder pain 09/21/2015   Disorders of bursae and tendons in shoulder region, unspecified 09/25/2013  Christne Platts, OTR/L,CBIS  [phone]  08/24/2021, 4:09 PM  [facility] [address] [place], [place], [address] Phone: [phone]   Fax:  [phone]  Name: SHANYLA MARCONI MRN: [id number] Date of Birth: 04/12/1976

## 2021-08-25 ENCOUNTER — Other Ambulatory Visit: Payer: Self-pay | Admitting: Orthopedic Surgery

## 2021-08-25 ENCOUNTER — Telehealth: Payer: Self-pay

## 2021-08-25 DIAGNOSIS — Z9889 Other specified postprocedural states: Secondary | ICD-10-CM

## 2021-08-25 MED ORDER — OXYCODONE-ACETAMINOPHEN 5-325 MG PO TABS: 1.0000 | ORAL_TABLET | Freq: Four times a day (QID) | ORAL | 0 refills | Status: DC | PRN

## 2021-08-25 NOTE — Telephone Encounter (Signed)
Oxycodone-Acetaminophen 5/325 mg Take 1 tablet by mouth every 6(six) hours as needed for severe pain.  PATIENT USES [facility]

## 2021-08-25 NOTE — Progress Notes (Signed)
Meds ordered this encounter  Medications   oxyCODONE-acetaminophen (PERCOCET/ROXICET) 5-325 MG tablet    Sig: Take 1 tablet by mouth every 6 (six) hours as needed for up to 5 days for severe pain.    Dispense:  20 tablet    Refill:  0

## 2021-08-25 NOTE — Telephone Encounter (Signed)
°     °  Oxycodone-Acetaminophen 5/325 mg    Take 1 tablet by mouth every 6(six) hours as needed for up to 5 days for severe pain.  PATIENT USES [facility]

## 2021-08-30 ENCOUNTER — Other Ambulatory Visit: Payer: Self-pay | Admitting: Orthopedic Surgery

## 2021-08-30 ENCOUNTER — Encounter (HOSPITAL_COMMUNITY): Payer: Self-pay | Admitting: Occupational Therapy

## 2021-08-30 ENCOUNTER — Ambulatory Visit (HOSPITAL_COMMUNITY): Payer: BC Managed Care – PPO | Admitting: Occupational Therapy

## 2021-08-30 ENCOUNTER — Other Ambulatory Visit: Payer: Self-pay

## 2021-08-30 DIAGNOSIS — M25511 Pain in right shoulder: Secondary | ICD-10-CM

## 2021-08-30 DIAGNOSIS — R29898 Other symptoms and signs involving the musculoskeletal system: Secondary | ICD-10-CM

## 2021-08-30 DIAGNOSIS — Z9889 Other specified postprocedural states: Secondary | ICD-10-CM

## 2021-08-30 DIAGNOSIS — M25611 Stiffness of right shoulder, not elsewhere classified: Secondary | ICD-10-CM

## 2021-08-30 MED ORDER — OXYCODONE-ACETAMINOPHEN 5-325 MG PO TABS: 1.0000 | ORAL_TABLET | Freq: Four times a day (QID) | ORAL | 0 refills | Status: AC | PRN

## 2021-08-30 NOTE — Therapy (Addendum)
[place], [place], [address] Phone: [phone]   Fax:  [phone]  Occupational Therapy Treatment ([facility])  Patient Details  Name: Tina Pham MRN: [id number] Date of Birth: [birth year] Referring Provider (OT): Arther Abbott, MD   OCCUPATIONAL THERAPY DISCHARGE SUMMARY  Visits from Start of Care: 15  Current functional level related to goals / functional outcomes: See below. Pt has met 2 STGs and partially met 2 LTGs. Pt called and reports MD has approved transitioning to HEP versus attending additional therapy sessions.    Remaining deficits: Pain, decreased strength in RUE   Education / Equipment: HEP   Patient agrees to discharge. Patient goals were partially met. Patient is being discharged due to being pleased with the current functional level.Hulan Fess OT Assessment - 08/30/21 1003       Assessment   Medical Diagnosis Right RTC repair      Precautions   Precautions Shoulder    Type of Shoulder Precautions Standard RCR protocol   Sling on 4-6 weeks   0-6 weeks: PROM, pendulum   6-12 weeks: AAROM progressing to AROM   12-24 weeks: Strengthening exercises      Observation/Other Assessments   Focus on Therapeutic Outcomes (FOTO)  59/100   44/100 previous     Palpation   Palpation comment Min fascial restrictions noted in the right upper arm and upper trapezius/lateral cervical region      AROM   Overall AROM Comments Assessed seated, er/IR adducted    AROM Assessment Site Shoulder    Right/Left Shoulder Right    Right Shoulder Flexion 120 Degrees   85 previous   Right Shoulder ABduction 140 Degrees   90 previous   Right Shoulder Internal Rotation 90 Degrees   same as previous   Right Shoulder External Rotation 77 Degrees   same as previous     PROM   Overall PROM Comments Assessed supine. IR/er adducted.    PROM Assessment Site Shoulder    Right/Left Shoulder Right     Right Shoulder Flexion 161 Degrees   148 previous   Right Shoulder ABduction 180 Degrees   151 previous   Right Shoulder Internal Rotation 90 Degrees   same as previous   Right Shoulder External Rotation 80 Degrees   78 previous     Strength   Overall Strength Comments Assessed seated, er/IR adducted    Strength Assessment Site Shoulder    Right/Left Shoulder Right    Right Shoulder Flexion 4-/5   3+/5 previous   Right Shoulder ABduction 4-/5   3-/5 previous   Right Shoulder Internal Rotation 5/5   4/5 previous   Right Shoulder External Rotation 4/5   4-/5 previous             Encounter Date: 08/30/2021   OT End of Session - 08/30/21 1042     Visit Number 15    Number of Visits 24    Date for OT Re-Evaluation 09/22/21    Authorization Type BCBS Commercial    Authorization Time Period 30 visit limit. 0 used.    Authorization - Visit Number 9    Authorization - Number of Visits 30    OT Start Time 5631    OT Stop Time 1041    OT Time Calculation (min) 39 min    Activity Tolerance Patient tolerated treatment well    Behavior During Therapy [facility] for tasks assessed/performed  Past Medical History:  Diagnosis Date   Anxiety    panic attacks   Arthritis    Chronic back pain    Chronic bronchitis (HCC)    Depression    Diabetes mellitus    GERD (gastroesophageal reflux disease)    prn tums   High cholesterol    History of kidney stones    passed ?   Hypertension    no longer on BP medications   Migraine    Neuropathic pain of foot    Plantar fasciitis    Pneumonia 2011ish   Sciatica    Torn rotator cuff     Past Surgical History:  Procedure Laterality Date   ABDOMINAL AORTOGRAM W/LOWER EXTREMITY N/A 07/01/2020   Procedure: ABDOMINAL AORTOGRAM W/LOWER EXTREMITY;  Surgeon: Marty Heck, MD;  Location: [facility];  Service: Cardiovascular;  Laterality: N/A;   AMPUTATION Left 07/22/2020   Procedure: LEFT GREAT TOE AMPUTATION;   Surgeon: Waynetta Sandy, MD;  Location: [facility];  Service: Vascular;  Laterality: Left;   BACK SURGERY     herniated disc; had disc removed and then fusion; total of 3 surgeries   EYE SURGERY Bilateral    removal of cyst and straightening of muscles   KNEE ARTHROSCOPY WITH MEDIAL MENISECTOMY Left 12/27/2016   Procedure: LEFT KNEE DIAGNOSTIC ARTHROSCOPY;  Surgeon: Carole Civil, MD;  [facility];  Service: Orthopedics;  Laterality: Left;   PERIPHERAL VASCULAR INTERVENTION Left 07/01/2020   Procedure: PERIPHERAL VASCULAR INTERVENTION;  Surgeon: Marty Heck, MD;  Location: [facility];  Service: Cardiovascular;  Laterality: Left;  common iliac   SHOULDER ARTHROSCOPY WITH DISTAL CLAVICLE RESECTION Right 05/13/2021   Procedure: ARTHROSCOPY RIGHT SHOULDER WITH DISTAL CLAVICLE EXCISION;  Surgeon: Carole Civil, MD;  [facility];  Service: Orthopedics;  Laterality: Right;   SHOULDER OPEN ROTATOR CUFF REPAIR Right 09/19/2018   Procedure: ROTATOR CUFF REPAIR SHOULDER OPEN;  Surgeon: Carole Civil, MD;  [facility]: [facility];  Service: Orthopedics;  Laterality: Right;   SHOULDER OPEN ROTATOR CUFF REPAIR Right 08/05/2019   Procedure: ROTATOR CUFF REPAIR SHOULDER OPEN;  Surgeon: Carole Civil, MD;  Location: [facility];  Service: Orthopedics;  Laterality: Right;   SHOULDER OPEN ROTATOR CUFF REPAIR Right 05/13/2021   Procedure: OPEN ROTATOR CUFF REPAIR RIGHT SHOULDER;  Surgeon: Carole Civil, MD;  Location: [facility];  Service: Orthopedics;  Laterality: Right;   TUBAL LIGATION      There were no vitals filed for this visit.   Subjective Assessment - 08/30/21 1002     Subjective  S: I've noticed my ROM is better.    Currently in Pain? Yes    Pain Score 4     Pain Location Shoulder    Pain Orientation Left    Pain Descriptors / Indicators Aching;Sore    Pain Type Acute pain    Pain Radiating Towards N/A    Pain Onset More than a month ago    Pain  Frequency Constant    Aggravating Factors  certain movements, lifting things    Pain Relieving Factors heat, pain medication    Effect of Pain on Daily Activities mod effect on ADLs    Multiple Pain Sites No                         OT Treatments/Exercises (OP) - 08/30/21 1015       Exercises   Exercises Shoulder  Shoulder Exercises: Standing   Protraction Strengthening;10 reps    Protraction Weight (lbs) 2    Horizontal ABduction Strengthening;10 reps    Horizontal ABduction Weight (lbs) 2    External Rotation Strengthening;10 reps    External Rotation Weight (lbs) 2    Internal Rotation Strengthening;10 reps    Internal Rotation Weight (lbs) 2    Flexion Strengthening;10 reps    Shoulder Flexion Weight (lbs) 2    ABduction Strengthening;10 reps    Shoulder ABduction Weight (lbs) 2      Shoulder Exercises: Therapy Ball   Other Therapy Ball Exercises green therapy ball: chest press 12X, circles 10X each direction, flexion 10X      Shoulder Exercises: ROM/Strengthening   UBE (Upper Arm Bike) Level 2 3' forward 3' reverse, pace: 4.5    X to V Arms 10X with 2#    Ball on Wall 1' flexion 1' abduction                    OT Education - 08/30/21 1043     Education Details educated on adding low weight to ROM exercises for strengthening    Person(s) Educated Patient    Methods Explanation    Comprehension Verbalized understanding              OT Short Term Goals - 08/30/21 1017       OT SHORT TERM GOAL #1   Title Patient will be educated and independent with HEP to faciliate progress in therapy and allow her to return to using her RUE as her dominant extremity for all daily and work activities.     Time 6    Period Weeks    Status On-going    Target Date 08/11/21      OT SHORT TERM GOAL #2   Title Patient will increase RUE P/ROM to [facility] in order to complete upper body dressing tasks with less difficulty.    Time 6    Period Weeks       OT SHORT TERM GOAL #3   Title Pt will increase RUE strength to 3/5 in order to complete waist level tasks with less difficulty.    Time 6    Period Weeks    Status Achieved      OT SHORT TERM GOAL #4   Title Patient will report a decrease in pain level of 5/10 when completing daily tasks with her RUE.    Time 6    Period Weeks    Status On-going      OT SHORT TERM GOAL #5   Title Patient will demonstrate fascial restrictions of min amount or less in her RUE in order to increase functional mobility needed to complete reaching tasks.    Time 6    Period Weeks               OT Long Term Goals - 08/30/21 1017       OT LONG TERM GOAL #1   Title Patient will return to highest level of independence while returning to work using her RUE for 75% or more of daily tasks.    Time 12    Period Weeks    Status On-going    Target Date 09/22/21      OT LONG TERM GOAL #2   Title Patient will increase A/ROM to [facility] in order to increase ability to reach above shoulder level and into cabinets with less difficulty.    Time 12  Period Weeks    Status Partially Met      OT LONG TERM GOAL #3   Title Patient will increase RUE strength to 4/5 in order to lift normal houshold items of moderate weight.    Time 12    Period Weeks    Status Partially Met      OT LONG TERM GOAL #4   Title Patient will report a decrease in pain of approximately 3/10 or less when using her RUE for daily tasks.    Time 12    Period Weeks    Status On-going                   Plan - 08/30/21 1020     Clinical Impression Statement A: Mini-reassessment completed this session, pt has met 2/5 STGs and has partially met 2 additional LTGs. Pt is making progress with both ROM and strength, as well as functional use of her RUE noting improvements in mobility with ADLs. Continues have moderate pain levels and difficulty with strength required for lifting and heavy use. Continued with strengthening this  session, resumed use of 2# weights and therapy ball exercises, added ball on wall. Verbal cuing for form and technique.    Body Structure / Function / Physical Skills ADL;UE functional use;Pain;Fascial restriction;ROM;Strength;Mobility;IADL    Plan P: Follow up on MD appt, continue with shoulder strengthening, overhead lacing with 1# wrist weight    OT Home Exercise Plan eval: table slides, A/ROM wrist, forearm, elbow, pendulums 12/20: AA/ROM supine - flexion, IR/er, protraction, horizontal abduction/adduction 1/12: A/ROM 1/25: internal rotation stretch; 2/7: add weights to ROM exercises.    Consulted and Agree with Plan of Care Patient             Patient will benefit from skilled therapeutic intervention in order to improve the following deficits and impairments:   Body Structure / Function / Physical Skills: ADL, UE functional use, Pain, Fascial restriction, ROM, Strength, Mobility, IADL       Visit Diagnosis: Acute pain of right shoulder  Other symptoms and signs involving the musculoskeletal system  Stiffness of right shoulder, not elsewhere classified    Problem List Patient Active Problem List   Diagnosis Date Noted   Arthrosis of right acromioclavicular joint    PAD (peripheral artery disease) ([place]) 07/13/2020   S/P rotator cuff surgery 10/04/2018   Complete tear of right rotator cuff    Chronic pain of left knee    Left shoulder pain 09/21/2015   Disorders of bursae and tendons in shoulder region, unspecified 09/25/2013    Guadelupe Sabin, OTR/L  [phone] 08/30/2021, 10:44 AM  [place], [place], [address] Phone: [phone]   Fax:  [phone]  Name: Tina Pham MRN: [id number] Date of Birth: Dec 01, 1975

## 2021-09-01 ENCOUNTER — Encounter (HOSPITAL_COMMUNITY): Payer: BC Managed Care – PPO | Admitting: Occupational Therapy

## 2021-09-01 ENCOUNTER — Encounter: Payer: Self-pay | Admitting: Orthopedic Surgery

## 2021-09-01 ENCOUNTER — Ambulatory Visit (INDEPENDENT_AMBULATORY_CARE_PROVIDER_SITE_OTHER): Payer: BC Managed Care – PPO | Admitting: Orthopedic Surgery

## 2021-09-01 ENCOUNTER — Other Ambulatory Visit: Payer: Self-pay

## 2021-09-01 DIAGNOSIS — Z9889 Other specified postprocedural states: Secondary | ICD-10-CM | POA: Diagnosis not present

## 2021-09-01 MED ORDER — HYDROCODONE-ACETAMINOPHEN 10-325 MG PO TABS: 1.0000 | ORAL_TABLET | ORAL | 0 refills | Status: AC | PRN

## 2021-09-01 NOTE — Progress Notes (Signed)
Chief Complaint  Patient presents with   Procedure    Rotator cuff repair right shoulder third time May 13, 2021 arthroscopy right shoulder distal clavicle excision open rotator cuff repair    Margaretha Glassing continues to improve I have her therapy notes her major complaint is pain in a continuous dull ache in the right shoulder and weakness  He is currently on oxycodone 5 mg every 6 with Tylenol  Her active range of motion right shoulder was 120 abduction 140 passive range of motion 160 of flexion 180 of abduction internal rotation 90 external rotation 80 strength 4-5 out of 5 depending on position  The patient is ready to go back to work in about 2 weeks  We discussed her opioid management we will start with 10 mg of hydrocodone every 6 hours for 2 prescriptions and then taper to 7.5 and then to 5 in 2-week intervals  Her return to work date is set for 23rd  Follow-up with me in 2 months  Meds ordered this encounter  Medications   HYDROcodone-acetaminophen (NORCO) 10-325 MG tablet    Sig: Take 1 tablet by mouth every 4 (four) hours as needed for up to 5 days.    Dispense:  30 tablet    Refill:  0

## 2021-09-05 ENCOUNTER — Encounter (HOSPITAL_COMMUNITY): Payer: BC Managed Care – PPO

## 2021-09-07 ENCOUNTER — Encounter: Payer: Self-pay | Admitting: Orthopedic Surgery

## 2021-09-07 ENCOUNTER — Other Ambulatory Visit: Payer: Self-pay | Admitting: Radiology

## 2021-09-07 ENCOUNTER — Encounter (HOSPITAL_COMMUNITY): Payer: BC Managed Care – PPO

## 2021-09-08 MED ORDER — HYDROCODONE-ACETAMINOPHEN 10-325 MG PO TABS
1.0000 | ORAL_TABLET | ORAL | 0 refills | Status: DC | PRN
Start: 2021-09-08 — End: 2021-09-27

## 2021-09-14 ENCOUNTER — Other Ambulatory Visit: Payer: Self-pay | Admitting: Orthopedic Surgery

## 2021-09-14 DIAGNOSIS — Z9889 Other specified postprocedural states: Secondary | ICD-10-CM

## 2021-09-14 MED ORDER — HYDROCODONE-ACETAMINOPHEN 7.5-325 MG PO TABS
1.0000 | ORAL_TABLET | ORAL | 0 refills | Status: DC | PRN
Start: 2021-09-14 — End: 2021-09-18

## 2021-09-14 NOTE — Progress Notes (Signed)
No diagnosis found.  Meds ordered this encounter  Medications   HYDROcodone-acetaminophen (NORCO) 7.5-325 MG tablet    Sig: Take 1 tablet by mouth every 4 (four) hours as needed for up to 5 days for moderate pain.    Dispense:  30 tablet    Refill:  0

## 2021-09-18 ENCOUNTER — Other Ambulatory Visit: Payer: Self-pay | Admitting: Orthopedic Surgery

## 2021-09-19 MED ORDER — HYDROCODONE-ACETAMINOPHEN 7.5-325 MG PO TABS: 1.0000 | ORAL_TABLET | ORAL | 0 refills | Status: DC | PRN

## 2021-09-23 ENCOUNTER — Other Ambulatory Visit: Payer: Self-pay | Admitting: Orthopedic Surgery

## 2021-09-23 MED ORDER — HYDROCODONE-ACETAMINOPHEN 7.5-325 MG PO TABS: 1.0000 | ORAL_TABLET | ORAL | 0 refills | Status: DC | PRN

## 2021-09-24 ENCOUNTER — Other Ambulatory Visit: Payer: Self-pay | Admitting: Vascular Surgery

## 2021-09-27 ENCOUNTER — Other Ambulatory Visit: Payer: Self-pay | Admitting: Orthopedic Surgery

## 2021-09-27 MED ORDER — HYDROCODONE-ACETAMINOPHEN 7.5-325 MG PO TABS: 1.0000 | ORAL_TABLET | ORAL | 0 refills | Status: DC | PRN

## 2021-09-28 ENCOUNTER — Other Ambulatory Visit: Payer: Self-pay | Admitting: Orthopedic Surgery

## 2021-09-30 ENCOUNTER — Telehealth: Payer: Self-pay

## 2021-09-30 NOTE — Telephone Encounter (Signed)
Left generic message informing patient that her medicine was at the pharmacy.  ?

## 2021-10-03 MED ORDER — HYDROCODONE-ACETAMINOPHEN 7.5-325 MG PO TABS: 1.0000 | ORAL_TABLET | ORAL | 0 refills | Status: DC | PRN

## 2021-10-06 ENCOUNTER — Other Ambulatory Visit: Payer: Self-pay | Admitting: *Deleted

## 2021-10-06 DIAGNOSIS — I739 Peripheral vascular disease, unspecified: Secondary | ICD-10-CM

## 2021-10-07 ENCOUNTER — Other Ambulatory Visit: Payer: Self-pay | Admitting: Orthopedic Surgery

## 2021-10-07 DIAGNOSIS — Z9889 Other specified postprocedural states: Secondary | ICD-10-CM

## 2021-10-07 MED ORDER — HYDROCODONE-ACETAMINOPHEN 5-325 MG PO TABS: 1.0000 | ORAL_TABLET | ORAL | 0 refills | Status: DC | PRN

## 2021-10-13 ENCOUNTER — Other Ambulatory Visit: Payer: Self-pay | Admitting: Orthopedic Surgery

## 2021-10-13 DIAGNOSIS — Z9889 Other specified postprocedural states: Secondary | ICD-10-CM

## 2021-10-14 MED ORDER — HYDROCODONE-ACETAMINOPHEN 5-325 MG PO TABS: 1.0000 | ORAL_TABLET | ORAL | 0 refills | Status: DC | PRN

## 2021-10-19 ENCOUNTER — Ambulatory Visit: Payer: BC Managed Care – PPO | Admitting: Vascular Surgery

## 2021-10-21 ENCOUNTER — Other Ambulatory Visit: Payer: Self-pay | Admitting: Orthopedic Surgery

## 2021-10-21 DIAGNOSIS — Z9889 Other specified postprocedural states: Secondary | ICD-10-CM

## 2021-10-21 MED ORDER — HYDROCODONE-ACETAMINOPHEN 5-325 MG PO TABS: 1.0000 | ORAL_TABLET | Freq: Four times a day (QID) | ORAL | 0 refills | Status: DC | PRN

## 2021-10-27 ENCOUNTER — Other Ambulatory Visit: Payer: Self-pay | Admitting: Orthopedic Surgery

## 2021-10-27 DIAGNOSIS — Z9889 Other specified postprocedural states: Secondary | ICD-10-CM

## 2021-10-27 MED ORDER — HYDROCODONE-ACETAMINOPHEN 5-325 MG PO TABS: 1.0000 | ORAL_TABLET | Freq: Four times a day (QID) | ORAL | 0 refills | Status: DC | PRN

## 2021-10-31 ENCOUNTER — Ambulatory Visit: Payer: BC Managed Care – PPO | Admitting: Orthopedic Surgery

## 2021-10-31 NOTE — Progress Notes (Deleted)
FOLLOW UP  ? ?No diagnosis found. ? ? ?No chief complaint on file. ? ? ?Procedure   ?    Rotator cuff repair right shoulder third time May 13, 2021 arthroscopy right shoulder distal clavicle excision open rotator cuff repair  ?  ?*** ?

## 2021-11-03 ENCOUNTER — Other Ambulatory Visit: Payer: Self-pay | Admitting: Orthopedic Surgery

## 2021-11-03 DIAGNOSIS — Z9889 Other specified postprocedural states: Secondary | ICD-10-CM

## 2021-11-03 MED ORDER — HYDROCODONE-ACETAMINOPHEN 5-325 MG PO TABS: 1.0000 | ORAL_TABLET | Freq: Four times a day (QID) | ORAL | 0 refills | Status: DC | PRN

## 2021-11-07 ENCOUNTER — Ambulatory Visit: Payer: Self-pay | Admitting: Orthopedic Surgery

## 2021-11-07 ENCOUNTER — Encounter: Payer: Self-pay | Admitting: Orthopedic Surgery

## 2021-11-07 VITALS — Ht 68.0 in | Wt 220.0 lb

## 2021-11-07 DIAGNOSIS — G8929 Other chronic pain: Secondary | ICD-10-CM

## 2021-11-07 DIAGNOSIS — Z9889 Other specified postprocedural states: Secondary | ICD-10-CM

## 2021-11-07 DIAGNOSIS — M25511 Pain in right shoulder: Secondary | ICD-10-CM

## 2021-11-07 MED ORDER — HYDROCODONE-ACETAMINOPHEN 5-325 MG PO TABS: 1.0000 | ORAL_TABLET | Freq: Four times a day (QID) | ORAL | 0 refills | Status: DC | PRN

## 2021-11-07 NOTE — Progress Notes (Signed)
FOLLOW UP  ? ?Encounter Diagnoses  ?Name Primary?  ? S/P rotator cuff surgery   ? Chronic right shoulder pain Yes  ? ? ? ?Chief Complaint  ?Patient presents with  ? right shoulder follow up  ?  S/P right rotator cuff repair x 3 most recent 05/13/2021.   ?Shoulder is much better than it was. Can move it much better. Still has days that it bothers her. Hydrocodone for pain with relief. Would like to have "bump" on incision checked as well.  ? ? ? ?Tina Pham is now 6 months out from her third rotator cuff surgery.  She has forward elevation now of 130 degrees she still has some tightness and soreness but again she cannot take any NSAIDs ? ?We are tapering her off of the hydrocodone she is down to 1 every 4 hours as needed ? ?Recommend she see me in 3 months ? ?I will try to taper her down to where she may be only needs 1 or 2/day.  I will try this up to 1 year after surgery and then if she still having trouble then we would recommend pain management and she actually asked me about pain management today and I told her I would try to do it up until a year from surgery ? ?Meds ordered this encounter  ?Medications  ? HYDROcodone-acetaminophen (NORCO/VICODIN) 5-325 MG tablet  ?  Sig: Take 1 tablet by mouth every 6 (six) hours as needed for up to 7 days for moderate pain.  ?  Dispense:  28 tablet  ?  Refill:  0  ? ? ?

## 2021-11-17 ENCOUNTER — Other Ambulatory Visit: Payer: Self-pay | Admitting: Orthopedic Surgery

## 2021-11-17 DIAGNOSIS — Z9889 Other specified postprocedural states: Secondary | ICD-10-CM

## 2021-11-18 MED ORDER — HYDROCODONE-ACETAMINOPHEN 5-325 MG PO TABS: 1.0000 | ORAL_TABLET | Freq: Four times a day (QID) | ORAL | 0 refills | Status: DC | PRN

## 2021-11-23 ENCOUNTER — Encounter: Payer: Self-pay | Admitting: Orthopedic Surgery

## 2021-11-23 ENCOUNTER — Ambulatory Visit (INDEPENDENT_AMBULATORY_CARE_PROVIDER_SITE_OTHER): Payer: Self-pay | Admitting: Vascular Surgery

## 2021-11-23 ENCOUNTER — Ambulatory Visit (INDEPENDENT_AMBULATORY_CARE_PROVIDER_SITE_OTHER): Payer: Self-pay

## 2021-11-23 DIAGNOSIS — I739 Peripheral vascular disease, unspecified: Secondary | ICD-10-CM

## 2021-11-23 NOTE — Progress Notes (Signed)
? ? [facility] ? ?Patient name: Tina Pham MRN: [id number] DOB: 05-27-1976 Sex: female ? ?REASON FOR VISIT: Follow-up left iliac artery stenting ? ?HPI: ?Tina Pham is a 46 y.o. female here today for follow-up.  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  She underwent a left common iliac artery stenting and eventual great toe amputation with Dr. Donzetta Matters on 07/22/2020.  She does report some occasional pins-and-needles sensation around her second toe and amputation site.  She does have peripheral neuropathy.  She has had no difficulty with walking or no new atheroembolic phenomenon.  She has no claudication symptoms. ? ?Past Medical History:  ?Diagnosis Date  ? Anxiety   ? panic attacks  ? Arthritis   ? Chronic back pain   ? Chronic bronchitis ([place])   ? Depression   ? Diabetes mellitus   ? GERD (gastroesophageal reflux disease)   ? prn tums  ? High cholesterol   ? History of kidney stones   ? passed ?  ? Hypertension   ? no longer on BP medications  ? Migraine   ? Neuropathic pain of foot   ? Plantar fasciitis   ? Pneumonia 2011ish  ? Sciatica   ? Torn rotator cuff   ? ? ?Family History  ?Problem Relation Age of Onset  ? Heart failure Mother   ? COPD Mother   ? Diabetes Mother   ? Heart failure Father   ? Diabetes Other   ? ? ?SOCIAL HISTORY: ?Social History  ? ?Tobacco Use  ? Smoking status: Every Day  ?  Packs/day: 1.00  ?  Years: 31.00  ?  Pack years: 31.00  ?  Types: Cigarettes  ? Smokeless tobacco: Never  ?Substance Use Topics  ? Alcohol use: No  ? ? ?Allergies  ?Allergen Reactions  ? Codeine Other (See Comments)  ?  Upset Stomach  ? Metformin Other (See Comments)  ?  Will not take per MD advisement   ? Benadryl [Diphenhydramine Hcl] Palpitations  ? Compazine Palpitations  ? ? ?Current Outpatient Medications  ?Medication Sig Dispense Refill  ? HYDROcodone-acetaminophen  (NORCO/VICODIN) 5-325 MG tablet Take 1 tablet by mouth every 6 (six) hours as needed for up to 7 days for moderate pain. 28 tablet 0  ? Accu-Chek Softclix Lancets lancets SMARTSIG:Topical    ? aspirin EC 81 MG tablet Take 81 mg by mouth daily. Swallow whole.    ? atorvastatin (LIPITOR) 10 MG tablet TAKE 1 TABLET(10 MG) BY MOUTH DAILY 30 tablet 11  ? atorvastatin (LIPITOR) 10 MG tablet TAKE 1 TABLET(10 MG) BY MOUTH DAILY 30 tablet 11  ? clopidogrel (PLAVIX) 75 MG tablet TAKE 1 TABLET(75 MG) BY MOUTH DAILY 30 tablet 3  ? clopidogrel (PLAVIX) 75 MG tablet TAKE 1 TABLET(75 MG) BY MOUTH DAILY 30 tablet 3  ? esomeprazole (NEXIUM) 20 MG capsule Take 20 mg by mouth daily as needed (acid reflux).    ? FEROSUL 325 (65 Fe) MG tablet Take 325 mg by mouth 2 (two) times daily.    ? gabapentin (NEURONTIN) 300 MG capsule TAKE 1 CAPSULE(300 MG) BY MOUTH THREE TIMES DAILY 90 capsule 5  ? glipiZIDE (GLUCOTROL) 10 MG tablet Take 10 mg by mouth in the morning.    ? ?No current facility-administered medications for this visit.  ? ? ?REVIEW OF SYSTEMS:  ?[X]  denotes positive finding, [ ]  denotes negative finding ?Cardiac  Comments:  ?Chest pain or  chest pressure:    ?Shortness of breath upon exertion:    ?Short of breath when lying flat:    ?Irregular heart rhythm:    ?    ?Vascular    ?Pain in calf, thigh, or hip brought on by ambulation:    ?Pain in feet at night that wakes you up from your sleep:     ?Blood clot in your veins:    ?Leg swelling:     ?    ? ? ?PHYSICAL EXAM: ?There were no vitals filed for this visit. ? ?GENERAL: The patient is a well-nourished female, in no acute distress. The vital signs are documented above. ?CARDIOVASCULAR: She has 2+ dorsalis pedis pulses bilaterally. ?PULMONARY: There is good air exchange  ?MUSCULOSKELETAL: Well-healed left great toe amputation ?NEUROLOGIC: No focal weakness or paresthesias are detected. ?SKIN: There are no ulcers or rashes noted. ?PSYCHIATRIC: The patient has a normal  affect. ? ?DATA:  ?Ankle arm index normal bilaterally today.  She did undergo imaging of her left common iliac artery stent.  There was some elevation of the velocities at 330 cm/s at the distal stent. ? ?MEDICAL ISSUES: ?I discussed these findings at length with the patient.  He is not having any claudication symptoms.  I did report that she does have evidence of some moderate narrowing in the stent in her common iliac artery.  I discussed symptoms of claudication with her and she will notify us should this occur.  Also she will notify us immediately should she develop any evidence of embolic phenomenon.  We will otherwise see her on an as-needed basis.  I feel it is also safe for her to stop her Plavix and continue lifelong daily aspirin therapy. ? ? ? ?Rosetta Posner, MD FACS ?[facility] ?Office Tel [phone] ? ?Note: Portions of this report may have been transcribed using voice recognition software.  Every effort has been made to ensure accuracy; however, inadvertent computerized transcription errors may still be present. ?

## 2021-11-24 ENCOUNTER — Other Ambulatory Visit: Payer: Self-pay

## 2021-11-24 DIAGNOSIS — Z9889 Other specified postprocedural states: Secondary | ICD-10-CM

## 2021-11-24 MED ORDER — HYDROCODONE-ACETAMINOPHEN 5-325 MG PO TABS: 1.0000 | ORAL_TABLET | Freq: Four times a day (QID) | ORAL | 0 refills | Status: DC | PRN

## 2021-12-01 ENCOUNTER — Other Ambulatory Visit: Payer: Self-pay | Admitting: Orthopedic Surgery

## 2021-12-01 DIAGNOSIS — Z9889 Other specified postprocedural states: Secondary | ICD-10-CM

## 2021-12-01 MED ORDER — HYDROCODONE-ACETAMINOPHEN 5-325 MG PO TABS: 1.0000 | ORAL_TABLET | Freq: Four times a day (QID) | ORAL | 0 refills | Status: DC | PRN

## 2021-12-08 ENCOUNTER — Other Ambulatory Visit: Payer: Self-pay | Admitting: Orthopedic Surgery

## 2021-12-08 DIAGNOSIS — Z9889 Other specified postprocedural states: Secondary | ICD-10-CM

## 2021-12-08 MED ORDER — HYDROCODONE-ACETAMINOPHEN 5-325 MG PO TABS: 1.0000 | ORAL_TABLET | Freq: Four times a day (QID) | ORAL | 0 refills | Status: DC | PRN

## 2021-12-15 ENCOUNTER — Other Ambulatory Visit: Payer: Self-pay | Admitting: Orthopedic Surgery

## 2021-12-15 DIAGNOSIS — Z9889 Other specified postprocedural states: Secondary | ICD-10-CM

## 2021-12-15 MED ORDER — HYDROCODONE-ACETAMINOPHEN 5-325 MG PO TABS: 1.0000 | ORAL_TABLET | Freq: Four times a day (QID) | ORAL | 0 refills | Status: DC | PRN

## 2021-12-22 ENCOUNTER — Other Ambulatory Visit: Payer: Self-pay | Admitting: Orthopedic Surgery

## 2021-12-22 DIAGNOSIS — Z9889 Other specified postprocedural states: Secondary | ICD-10-CM

## 2021-12-22 MED ORDER — HYDROCODONE-ACETAMINOPHEN 5-325 MG PO TABS: 1.0000 | ORAL_TABLET | Freq: Four times a day (QID) | ORAL | 0 refills | Status: DC | PRN

## 2021-12-29 ENCOUNTER — Other Ambulatory Visit: Payer: Self-pay | Admitting: Orthopedic Surgery

## 2021-12-29 DIAGNOSIS — Z9889 Other specified postprocedural states: Secondary | ICD-10-CM

## 2021-12-29 MED ORDER — HYDROCODONE-ACETAMINOPHEN 5-325 MG PO TABS: 1.0000 | ORAL_TABLET | Freq: Four times a day (QID) | ORAL | 0 refills | Status: DC | PRN

## 2022-01-05 ENCOUNTER — Other Ambulatory Visit: Payer: Self-pay | Admitting: Orthopedic Surgery

## 2022-01-05 ENCOUNTER — Encounter: Payer: Self-pay | Admitting: Orthopedic Surgery

## 2022-01-05 DIAGNOSIS — M542 Cervicalgia: Secondary | ICD-10-CM

## 2022-01-05 DIAGNOSIS — Z9889 Other specified postprocedural states: Secondary | ICD-10-CM

## 2022-01-05 MED ORDER — HYDROCODONE-ACETAMINOPHEN 5-325 MG PO TABS: 1.0000 | ORAL_TABLET | Freq: Four times a day (QID) | ORAL | 0 refills | Status: DC | PRN

## 2022-01-05 MED ORDER — GABAPENTIN 300 MG PO CAPS: ORAL_CAPSULE | ORAL | 5 refills | Status: DC

## 2022-01-05 NOTE — Addendum Note (Signed)
Addended by: Vickki Hearing on: 01/05/2022 04:45 PM   Modules accepted: Orders

## 2022-01-09 ENCOUNTER — Other Ambulatory Visit: Payer: Self-pay | Admitting: Orthopedic Surgery

## 2022-01-09 DIAGNOSIS — Z9889 Other specified postprocedural states: Secondary | ICD-10-CM

## 2022-01-09 MED ORDER — HYDROCODONE-ACETAMINOPHEN 5-325 MG PO TABS: 1.0000 | ORAL_TABLET | Freq: Four times a day (QID) | ORAL | 0 refills | Status: AC | PRN

## 2022-01-09 NOTE — Telephone Encounter (Signed)
Patient aware Dr Romeo Apple will be out of clinic after today; please advise regarding request of her refill which she said is due at the end of this week:   HYDROcodone-acetaminophen (NORCO/VICODIN) 5-325 MG tablet 28 tablet       [employer], [address], [place]

## 2022-01-10 ENCOUNTER — Other Ambulatory Visit: Payer: Self-pay | Admitting: Orthopedic Surgery

## 2022-01-10 DIAGNOSIS — Z9889 Other specified postprocedural states: Secondary | ICD-10-CM

## 2022-01-10 MED ORDER — HYDROCODONE-ACETAMINOPHEN 5-325 MG PO TABS: 1.0000 | ORAL_TABLET | Freq: Four times a day (QID) | ORAL | 0 refills | Status: AC | PRN

## 2022-01-10 NOTE — Progress Notes (Signed)
Meds ordered this encounter  Medications   HYDROcodone-acetaminophen (NORCO/VICODIN) 5-325 MG tablet    Sig: Take 1 tablet by mouth every 6 (six) hours as needed for up to 7 days for moderate pain.    Dispense:  28 tablet    Refill:  0   For 6/27

## 2022-01-25 ENCOUNTER — Other Ambulatory Visit: Payer: Self-pay | Admitting: Radiology

## 2022-01-25 MED ORDER — HYDROCODONE-ACETAMINOPHEN 5-325 MG PO TABS: 1.0000 | ORAL_TABLET | Freq: Four times a day (QID) | ORAL | 0 refills | Status: DC | PRN

## 2022-02-01 ENCOUNTER — Other Ambulatory Visit: Payer: Self-pay | Admitting: Orthopedic Surgery

## 2022-02-01 MED ORDER — HYDROCODONE-ACETAMINOPHEN 5-325 MG PO TABS: 1.0000 | ORAL_TABLET | Freq: Four times a day (QID) | ORAL | 0 refills | Status: DC | PRN

## 2022-02-06 ENCOUNTER — Encounter: Payer: Self-pay | Admitting: Orthopedic Surgery

## 2022-02-06 ENCOUNTER — Ambulatory Visit (INDEPENDENT_AMBULATORY_CARE_PROVIDER_SITE_OTHER): Payer: Self-pay | Admitting: Orthopedic Surgery

## 2022-02-06 DIAGNOSIS — M62838 Other muscle spasm: Secondary | ICD-10-CM

## 2022-02-06 DIAGNOSIS — M25511 Pain in right shoulder: Secondary | ICD-10-CM

## 2022-02-06 DIAGNOSIS — G8929 Other chronic pain: Secondary | ICD-10-CM

## 2022-02-06 MED ORDER — PREDNISONE 10 MG PO TABS: 10.0000 mg | ORAL_TABLET | Freq: Three times a day (TID) | ORAL | 0 refills | Status: DC

## 2022-02-06 MED ORDER — TIZANIDINE HCL 4 MG PO TABS: 4.0000 mg | ORAL_TABLET | Freq: Four times a day (QID) | ORAL | 0 refills | Status: DC | PRN

## 2022-02-06 MED ORDER — HYDROCODONE-ACETAMINOPHEN 5-325 MG PO TABS: 1.0000 | ORAL_TABLET | Freq: Four times a day (QID) | ORAL | 0 refills | Status: DC | PRN

## 2022-02-06 NOTE — Progress Notes (Signed)
Meds ordered this encounter  Medications   tiZANidine (ZANAFLEX) 4 MG tablet    Sig: Take 1 tablet (4 mg total) by mouth every 6 (six) hours as needed for muscle spasms.    Dispense:  30 tablet    Refill:  0   HYDROcodone-acetaminophen (NORCO/VICODIN) 5-325 MG tablet    Sig: Take 1 tablet by mouth every 6 (six) hours as needed for moderate pain.    Dispense:  28 tablet    Refill:  0   predniSONE (DELTASONE) 10 MG tablet    Sig: Take 1 tablet (10 mg total) by mouth 3 (three) times daily.    Dispense:  42 tablet    Refill:  0   Chief Complaint  Patient presents with   Shoulder Pain    Right/ has pain at incision site tender in area where stitches were    05/13/22  DOS   Aleesia is here for follow-up she has had 3 surgeries on that right shoulder.  She is complains of pain over the right trapezius muscle  Exam reveals that it is tight.  She has no restrictions in her passive range of motion of her shoulder some discomfort but most of it is coming from the trap  She has a little area from one of the portal sites which may have a little and trap stitch but nothing to do is not red it sore occasionally she can place a Band-Aid over that if it is irritated  She is having some muscle spasms, she still having some chronic pain which is not unexpected  I like to give her some prednisone she cannot take regular anti-inflammatories because of her heart condition  Follow-up with me in 3 months will be 1 year from the third surgery and then we can make an assessment on what her long-term prognosis is

## 2022-02-15 ENCOUNTER — Other Ambulatory Visit: Payer: Self-pay | Admitting: Orthopedic Surgery

## 2022-02-15 DIAGNOSIS — G8929 Other chronic pain: Secondary | ICD-10-CM

## 2022-02-16 MED ORDER — HYDROCODONE-ACETAMINOPHEN 5-325 MG PO TABS: 1.0000 | ORAL_TABLET | Freq: Three times a day (TID) | ORAL | 0 refills | Status: DC | PRN

## 2022-02-21 ENCOUNTER — Other Ambulatory Visit: Payer: Self-pay | Admitting: Orthopedic Surgery

## 2022-02-21 DIAGNOSIS — G8929 Other chronic pain: Secondary | ICD-10-CM

## 2022-02-21 MED ORDER — HYDROCODONE-ACETAMINOPHEN 5-325 MG PO TABS: 1.0000 | ORAL_TABLET | Freq: Three times a day (TID) | ORAL | 0 refills | Status: DC | PRN

## 2022-02-27 ENCOUNTER — Other Ambulatory Visit: Payer: Self-pay

## 2022-02-27 ENCOUNTER — Encounter: Payer: Self-pay | Admitting: Orthopedic Surgery

## 2022-02-28 ENCOUNTER — Other Ambulatory Visit: Payer: Self-pay | Admitting: Orthopedic Surgery

## 2022-02-28 DIAGNOSIS — G8929 Other chronic pain: Secondary | ICD-10-CM

## 2022-02-28 MED ORDER — HYDROCODONE-ACETAMINOPHEN 5-325 MG PO TABS: 1.0000 | ORAL_TABLET | Freq: Three times a day (TID) | ORAL | 0 refills | Status: DC | PRN

## 2022-02-28 NOTE — Progress Notes (Signed)
Meds ordered this encounter  Medications   HYDROcodone-acetaminophen (NORCO/VICODIN) 5-325 MG tablet    Sig: Take 1 tablet by mouth every 8 (eight) hours as needed for up to 5 days for moderate pain.    Dispense:  15 tablet    Refill:  0    

## 2022-03-02 ENCOUNTER — Other Ambulatory Visit: Payer: Self-pay | Admitting: Orthopedic Surgery

## 2022-03-02 DIAGNOSIS — G8929 Other chronic pain: Secondary | ICD-10-CM

## 2022-03-02 MED ORDER — HYDROCODONE-ACETAMINOPHEN 5-325 MG PO TABS: 1.0000 | ORAL_TABLET | Freq: Three times a day (TID) | ORAL | 0 refills | Status: DC | PRN

## 2022-03-07 ENCOUNTER — Other Ambulatory Visit: Payer: Self-pay | Admitting: Orthopedic Surgery

## 2022-03-07 DIAGNOSIS — G8929 Other chronic pain: Secondary | ICD-10-CM

## 2022-03-07 MED ORDER — HYDROCODONE-ACETAMINOPHEN 5-325 MG PO TABS: 1.0000 | ORAL_TABLET | Freq: Three times a day (TID) | ORAL | 0 refills | Status: AC | PRN

## 2022-03-14 ENCOUNTER — Other Ambulatory Visit: Payer: Self-pay

## 2022-03-14 NOTE — Telephone Encounter (Signed)
Hydrocodone-Acetaminophen 5/325 MG   Qty 15 Tablets  Take 1 tablet by mouth every 8 (eight) hours as needed for up to 5 days for moderate pain.  PATIENT USES [facility] ON [address]

## 2022-03-15 ENCOUNTER — Other Ambulatory Visit: Payer: Self-pay | Admitting: Orthopedic Surgery

## 2022-03-15 MED ORDER — HYDROCODONE-ACETAMINOPHEN 5-325 MG PO TABS: 1.0000 | ORAL_TABLET | Freq: Three times a day (TID) | ORAL | 0 refills | Status: DC | PRN

## 2022-03-15 NOTE — Progress Notes (Signed)
Meds ordered this encounter  Medications   HYDROcodone-acetaminophen (NORCO/VICODIN) 5-325 MG tablet    Sig: Take 1 tablet by mouth every 8 (eight) hours as needed for up to 5 days for moderate pain.    Dispense:  15 tablet    Refill:  0    

## 2022-03-16 ENCOUNTER — Other Ambulatory Visit: Payer: Self-pay | Admitting: Orthopedic Surgery

## 2022-03-16 ENCOUNTER — Emergency Department (HOSPITAL_COMMUNITY): Payer: Self-pay

## 2022-03-16 ENCOUNTER — Other Ambulatory Visit: Payer: Self-pay

## 2022-03-16 ENCOUNTER — Encounter (HOSPITAL_COMMUNITY): Payer: Self-pay

## 2022-03-16 ENCOUNTER — Emergency Department (HOSPITAL_COMMUNITY)
Admission: EM | Admit: 2022-03-16 | Discharge: 2022-03-16 | Disposition: A | Discharge status: Home / Self Care | Payer: Self-pay | Attending: Emergency Medicine | Admitting: Emergency Medicine

## 2022-03-16 DIAGNOSIS — I10 Essential (primary) hypertension: Secondary | ICD-10-CM | POA: Insufficient documentation

## 2022-03-16 DIAGNOSIS — E119 Type 2 diabetes mellitus without complications: Secondary | ICD-10-CM | POA: Insufficient documentation

## 2022-03-16 DIAGNOSIS — Z7982 Long term (current) use of aspirin: Secondary | ICD-10-CM | POA: Insufficient documentation

## 2022-03-16 DIAGNOSIS — Z79899 Other long term (current) drug therapy: Secondary | ICD-10-CM | POA: Insufficient documentation

## 2022-03-16 DIAGNOSIS — N3001 Acute cystitis with hematuria: Secondary | ICD-10-CM | POA: Insufficient documentation

## 2022-03-16 LAB — URINALYSIS, ROUTINE W REFLEX MICROSCOPIC
Bilirubin Urine: NEGATIVE
Glucose, UA: 150 mg/dL — AB
Ketones, ur: NEGATIVE mg/dL
Nitrite: NEGATIVE
Protein, ur: NEGATIVE mg/dL
Specific Gravity, Urine: 1.024 (ref 1.005–1.030)
pH: 5 (ref 5.0–8.0)

## 2022-03-16 LAB — BASIC METABOLIC PANEL
Anion gap: 7 (ref 5–15)
BUN: 10 mg/dL (ref 6–20)
CO2: 21 mmol/L — ABNORMAL LOW (ref 22–32)
Calcium: 9 mg/dL (ref 8.9–10.3)
Chloride: 107 mmol/L (ref 98–111)
Creatinine, Ser: 0.48 mg/dL (ref 0.44–1.00)
GFR, Estimated: >60 mL/min (ref >60)
Glucose, Bld: 267 mg/dL — ABNORMAL HIGH (ref 70–99)
Potassium: 3.6 mmol/L (ref 3.5–5.1)
Sodium: 135 mmol/L (ref 135–145)

## 2022-03-16 LAB — CBC
HCT: 39.7 % (ref 36.0–46.0)
Hemoglobin: 12.5 g/dL (ref 12.0–15.0)
MCH: 26 pg (ref 26.0–34.0)
MCHC: 31.5 g/dL (ref 30.0–36.0)
MCV: 82.7 fL (ref 80.0–100.0)
Platelets: 355 10*3/uL (ref 150–400)
RBC: 4.8 MIL/uL (ref 3.87–5.11)
RDW: 15.1 % (ref 11.5–15.5)
WBC: 7.1 10*3/uL (ref 4.0–10.5)
nRBC: 0 % (ref 0.0–0.2)

## 2022-03-16 LAB — PREGNANCY, URINE: Preg Test, Ur: NEGATIVE

## 2022-03-16 MED ORDER — MORPHINE SULFATE (PF) 4 MG/ML IV SOLN
2.0000 mg | Freq: Once | INTRAVENOUS | Status: AC
  Administered 2022-03-16: 2 mg via INTRAVENOUS
  Filled 2022-03-16: qty 1

## 2022-03-16 MED ORDER — ONDANSETRON HCL 4 MG/2ML IJ SOLN
4.0000 mg | Freq: Once | INTRAMUSCULAR | Status: AC
Start: 2022-03-16 — End: 2022-03-16
  Administered 2022-03-16: 4 mg via INTRAVENOUS
  Filled 2022-03-16: qty 2

## 2022-03-16 MED ORDER — CEFADROXIL 500 MG PO CAPS: 500.0000 mg | ORAL_CAPSULE | Freq: Two times a day (BID) | ORAL | 0 refills | Status: DC

## 2022-03-16 NOTE — ED Triage Notes (Signed)
Pt reports pain in r flank x 2 days.  Denies injury.  Reports history of kidney stone years ago.  Denies any urinary symptoms.

## 2022-03-16 NOTE — ED Provider Notes (Signed)
[facility] EMERGENCY DEPARTMENT Provider Note   CSN: [id number] Arrival date & time: 03/16/22  1100     History  Chief Complaint  Patient presents with   Flank Pain    Tina Pham is a 46 y.o. female patient reports of pain in right flank radiating to right back for the last 2 days, she denies any dysuria, hematuria, vaginal discharge, abdominal pain, nausea, vomiting.  She reports the pain is always there but becomes more severe sometimes.  She has a remote history of kidney stones.  She also has had a history of lumbar back surgery.  She denies any numbness, tingling, difficulty with walking, balance, strength.  She denies any saddle anesthesia.  Denies any previous history of intravenous drug use, previous cancer.  She reports that she is taking hydrocodone 5-3 25 at home already and it is not touching her pain.   Flank Pain       Home Medications Prior to Admission medications   Medication Sig Start Date End Date Taking? Authorizing Provider  cefadroxil (DURICEF) 500 MG capsule Take 1 capsule (500 mg total) by mouth 2 (two) times daily. 03/16/22  Yes Delaina Fetsch H, PA-C  Accu-Chek Softclix Lancets lancets SMARTSIG:Topical 04/27/21   [provider]  aspirin EC 81 MG tablet Take 81 mg by mouth daily. Swallow whole.    [provider]  atorvastatin (LIPITOR) 10 MG tablet TAKE 1 TABLET(10 MG) BY MOUTH DAILY 09/26/21   Maeola Harman, MD  esomeprazole (NEXIUM) 20 MG capsule Take 20 mg by mouth daily as needed (acid reflux).    [provider]  FEROSUL 325 (65 Fe) MG tablet Take 325 mg by mouth 2 (two) times daily. 04/27/21   [provider]  gabapentin (NEURONTIN) 300 MG capsule TAKE 1 CAPSULE(300 MG) BY MOUTH THREE TIMES DAILY 01/05/22   Vickki Hearing, MD  glipiZIDE (GLUCOTROL) 10 MG tablet Take 10 mg by mouth in the morning. 04/13/21   [provider]  HYDROcodone-acetaminophen (NORCO/VICODIN) 5-325 MG tablet  Take 1 tablet by mouth every 8 (eight) hours as needed for up to 5 days for moderate pain. 03/15/22 03/20/22  Vickki Hearing, MD  tiZANidine (ZANAFLEX) 4 MG tablet Take 1 tablet (4 mg total) by mouth every 6 (six) hours as needed for muscle spasms. 02/06/22   Vickki Hearing, MD  metFORMIN (GLUCOPHAGE) 500 MG tablet Take 1 tablet (500 mg total) by mouth 2 (two) times daily with a meal. 06/15/20 06/15/20  Elson Areas, PA-C      Allergies    Codeine, Metformin, Benadryl [diphenhydramine hcl], and Compazine    Review of Systems   Review of Systems  Genitourinary:  Positive for flank pain.  All other systems reviewed and are negative.   Physical Exam Updated Vital Signs BP 127/78 (BP Location: Left Arm)   Pulse 75   Temp 98 F (36.7 C) (Oral)   Resp 17   Ht 5\' 8"  (1.727 m)   Wt 98.9 kg   LMP 12/20/2021 (Approximate) Comment: denies pregnancy  SpO2 100%   BMI 33.15 kg/m  Physical Exam Vitals and nursing note reviewed.  Constitutional:      General: She is not in acute distress.    Appearance: Normal appearance.  HENT:     Head: Normocephalic and atraumatic.  Eyes:     General:        Right eye: No discharge.        Left eye: No discharge.  Cardiovascular:     Rate and Rhythm: Normal rate and regular rhythm.     Heart sounds: No murmur heard.    No friction rub. No gallop.  Pulmonary:     Effort: Pulmonary effort is normal.     Breath sounds: Normal breath sounds.  Abdominal:     General: Bowel sounds are normal.     Palpations: Abdomen is soft.     Comments: Tenderness palpation right flank, right lower back.  No midline spinal tenderness.  No rebound, rigidity, guarding throughout.  No suprapubic tenderness.  Musculoskeletal:     Comments: Intact strength 5 out of 5 bilateral upper and lower extremities.  Patient can ambulate without difficulty.  Skin:    General: Skin is warm and dry.     Capillary Refill: Capillary refill takes less than 2 seconds.   Neurological:     Mental Status: She is alert and oriented to person, place, and time.  Psychiatric:        Mood and Affect: Mood normal.        Behavior: Behavior normal.     ED Results / Procedures / Treatments   Labs (all labs ordered are listed, but only abnormal results are displayed) Labs Reviewed  URINALYSIS, ROUTINE W REFLEX MICROSCOPIC - Abnormal; Notable for the following components:      Result Value   APPearance HAZY (*)    Glucose, UA 150 (*)    Hgb urine dipstick MODERATE (*)    Leukocytes,Ua MODERATE (*)    Bacteria, UA MANY (*)    All other components within normal limits  BASIC METABOLIC PANEL - Abnormal; Notable for the following components:   CO2 21 (*)    Glucose, Bld 267 (*)    All other components within normal limits  URINE CULTURE  PREGNANCY, URINE  CBC    EKG None  Radiology CT Renal Stone Study  Result Date: 03/16/2022 CLINICAL DATA:  Right flank pain radiating to the back. History of stones. EXAM: CT ABDOMEN AND PELVIS WITHOUT CONTRAST TECHNIQUE: Multidetector CT imaging of the abdomen and pelvis was performed following the standard protocol without IV contrast. RADIATION DOSE REDUCTION: This exam was performed according to the departmental dose-optimization program which includes automated exposure control, adjustment of the mA and/or kV according to patient size and/or use of iterative reconstruction technique. COMPARISON:  CT abdomen/pelvis 08/01/2016 FINDINGS: Lower chest: There is a sub 6 mm nodule in the lateral left base which is unchanged since 2018, benign. No significantly follow up is required. The lung bases are otherwise clear. Coronary artery calcifications are noted. The imaged heart is otherwise unremarkable. Hepatobiliary: The liver is unremarkable. Small gallstones are seen without evidence of acute cholecystitis. There is no biliary ductal dilatation. Pancreas: Unremarkable. Spleen: Unremarkable. Adrenals/Urinary Tract: Hypodense  lesions in the kidneys likely reflect benign cyst for which no specific imaging follow-up is required, similar to the prior study from 2018. There are no suspicious lesions within the confines of noncontrast technique. No stones are seen in either kidney or along the course of either ureter. There is no hydronephrosis or hydroureter. The bladder is unremarkable Stomach/Bowel: The stomach is unremarkable. There is no evidence of bowel obstruction. There is no abnormal bowel wall thickening or inflammatory change. The appendix is normal. There are a few sigmoid colonic diverticula without evidence of acute diverticulitis. Vascular/Lymphatic: There is extensive calcification in the nonaneurysmal abdominal aorta, accelerated for age. A left common iliac artery stent is noted. There is no abdominopelvic  lymphadenopathy. Reproductive: The uterus is unremarkable. There is a 2.6 cm left adnexal cyst for which no specific imaging follow-up is required. There is no suspicious adnexal mass. Other: There is trace free fluid in the pelvis, nonspecific and within physiologic limits. There is no upper abdominal ascites. There is no free intraperitoneal air. Musculoskeletal: There is no acute osseous abnormality or suspicious osseous lesion. Postsurgical changes reflecting anterior fusion at L5-S1 are seen. IMPRESSION: 1. No acute finding in the abdomen or pelvis. No renal stones or hydronephrosis. 2. Cholelithiasis without evidence of acute cholecystitis. 3. A few sigmoid diverticuli without evidence of acute diverticulitis. 4. Advanced aortic atherosclerotic Electronically Signed   By: Lesia Hausen M.D.   On: 03/16/2022 14:45    Procedures Procedures    Medications Ordered in ED Medications  morphine (PF) 4 MG/ML injection 2 mg (2 mg Intravenous Given 03/16/22 1400)  ondansetron (ZOFRAN) injection 4 mg (4 mg Intravenous Given 03/16/22 1400)    ED Course/ Medical Decision Making/ A&P                           Medical  Decision Making Amount and/or Complexity of Data Reviewed Labs: ordered. Radiology: ordered.  Risk Prescription drug management.   This patient is a 46 y.o. female who presents to the ED for concern of right flank pain radiating to the back for 2 days without known injury, this involves an extensive number of treatment options, and is a complaint that carries with it a high risk of complications and morbidity. The emergent differential diagnosis prior to evaluation includes, but is not limited to, kidney stones, pyelonephritis, nephrolithiasis, low back pain, sciatica, abnormal presentation of appendicitis or other intra-abdominal infection, simple musculoskeletal pain, versus other.   This is not an exhaustive differential.   Past Medical History / Co-morbidities / Social History: Depression, previous kidney stones, anxiety, diabetes, hypertension, arthritis, sciatica, previous back surgery in the lumbar region, although not recently.  Additional history: Chart reviewed. Pertinent results include: Reviewed outpatient orthopedics visits, patient with no recent emergency department visits in the last 2 years.  Physical Exam: Physical exam performed. The pertinent findings include: Patient with pain on the right flank, radiating to the right back.  No rebound, rigidity, guarding.  No significant suprapubic tenderness  Lab Tests: I ordered, and personally interpreted labs.  The pertinent results include: BMP is remarkable for hyperglycemia, glucose 267, patient is a known diabetic I think that she is likely having some hyperglycemia secondary to stress, she is not having any significant acidosis or anion gap to suggest impending DKA.  UA with moderate hemoglobin, leukocytes, hyaline casts, bacteria, and white blood cells.  In absence of an alternative diagnosis for patient's pain I think it is likely that her symptoms are representative of UTI, although there could be some component of  musculoskeletal pain to her condition.  We will send her urine for culture.  In the meantime think it be reasonable to treat with cefadroxil.  Urine pregnancy test is negative.  CBC is unremarkable, no leukocytosis or clinically significant anemia.   Imaging Studies: I ordered imaging studies including CT renal stone study. I independently visualized and interpreted imaging which showed no evidence of kidney stones or other intra-abdominal findings to explain patient's symptoms. I agree with the radiologist interpretation.   Medications: I ordered medication including morphine for pain, Zofran for nausea. Reevaluation of the patient after these medicines showed that the patient improved. I have  reviewed the patients home medicines and have made adjustments as needed.   Disposition: After consideration of the diagnostic results and the patients response to treatment, I feel that patient stable for discharge at this time with presumed diagnosis of UTI, possible some component of musculoskeletal back pain.  We will treat with cefadroxil, encouraged ibuprofen, Tylenol, patient can use home hydrocodone as needed for breakthrough pain.  Encourage follow-up with PCP to ensure resolution of symptoms.  Discussed extensive return precautions.Marland Kitchen   emergency department workup does not suggest an emergent condition requiring admission or immediate intervention beyond what has been performed at this time. The plan is: As above. The patient is safe for discharge and has been instructed to return immediately for worsening symptoms, change in symptoms or any other concerns.  I discussed this case with my attending physician Dr. Estell Harpin who cosigned this note including patient's presenting symptoms, physical exam, and planned diagnostics and interventions. Attending physician stated agreement with plan or made changes to plan which were implemented.     Final Clinical Impression(s) / ED Diagnoses Final diagnoses:   Acute cystitis with hematuria    Rx / DC Orders ED Discharge Orders          Ordered    cefadroxil (DURICEF) 500 MG capsule  2 times daily        03/16/22 1510              Jakala Herford, [place], PA-C 03/16/22 1521    Bethann Berkshire, MD 03/16/22 1821

## 2022-03-16 NOTE — Discharge Instructions (Signed)
Please use Tylenol or ibuprofen for pain.  You may use 600 mg ibuprofen every 6 hours or 1000 mg of Tylenol every 6 hours.  You may choose to alternate between the 2.  This would be most effective.  Not to exceed 4 g of Tylenol within 24 hours.  Not to exceed 3200 mg ibuprofen 24 hours.  You can use the narcotic pain medication that you already have at home in place of Tylenol in the above algorithm.  Please take the entire course of antibiotics I prescribed.  I recommend that you take them on a full stomach.  Please follow-up with your primary care doctor to ensure that your symptoms are resolving.  If you have worsening symptoms, fever, chills, or inability urinate please return to the emergency department for further evaluation.

## 2022-03-18 ENCOUNTER — Other Ambulatory Visit: Payer: Self-pay | Admitting: Orthopedic Surgery

## 2022-03-18 LAB — URINE CULTURE: Culture: 80000 — AB

## 2022-03-19 ENCOUNTER — Telehealth: Payer: Self-pay | Admitting: *Deleted

## 2022-03-19 NOTE — Telephone Encounter (Signed)
Post ED Visit - Positive Culture Follow-up  Culture report reviewed by antimicrobial stewardship pharmacist: [facility] []  , Pharm.D. []  Enzo Bi, Pharm.D., BCPS AQ-ID []  , Pharm.D., BCPS []  Celedonio Miyamoto, Pharm.D., BCPS []  [place], Garvin Fila.D., BCPS, AAHIVP []  , Pharm.D., BCPS, AAHIVP []  Georgina Pillion, PharmD, BCPS []  , PharmD, BCPS []  [place], PharmD, BCPS []  [place], PharmD []  , PharmD, BCPS []  Estella Husk, PharmD  Pharmacy Team []  Lysle Pearl, PharmD []  , PharmD []  Phillips Climes, PharmD []  , Rph []  Agapito Games) , PharmD []  Verlan Friends, PharmD []  , PharmD []  Mervyn Gay, PharmD []  , PharmD []  Vinnie Level, PharmD []  [facility], PharmD []  , PharmD []  Len Childs, PharmD   Positive urine culture Treated with Cefadroxil, organism sensitive to the same and no further patient follow-up is required at this time. , PharmD  Greer Pickerel Talley 03/19/2022, 12:23 PM

## 2022-03-20 MED ORDER — HYDROCODONE-ACETAMINOPHEN 5-325 MG PO TABS: 1.0000 | ORAL_TABLET | Freq: Three times a day (TID) | ORAL | 0 refills | Status: DC | PRN

## 2022-03-23 ENCOUNTER — Other Ambulatory Visit: Payer: Self-pay | Admitting: Orthopedic Surgery

## 2022-03-23 MED ORDER — HYDROCODONE-ACETAMINOPHEN 5-325 MG PO TABS: 1.0000 | ORAL_TABLET | Freq: Three times a day (TID) | ORAL | 0 refills | Status: AC | PRN

## 2022-03-30 ENCOUNTER — Other Ambulatory Visit: Payer: Self-pay | Admitting: Radiology

## 2022-03-30 ENCOUNTER — Encounter: Payer: Self-pay | Admitting: Orthopedic Surgery

## 2022-03-30 MED ORDER — HYDROCODONE-ACETAMINOPHEN 5-325 MG PO TABS: 1.0000 | ORAL_TABLET | Freq: Three times a day (TID) | ORAL | 0 refills | Status: DC

## 2022-04-03 ENCOUNTER — Other Ambulatory Visit: Payer: Self-pay | Admitting: Orthopedic Surgery

## 2022-04-04 MED ORDER — HYDROCODONE-ACETAMINOPHEN 5-325 MG PO TABS: 1.0000 | ORAL_TABLET | Freq: Two times a day (BID) | ORAL | 0 refills | Status: AC | PRN

## 2022-04-10 ENCOUNTER — Encounter: Payer: Self-pay | Admitting: Orthopedic Surgery

## 2022-04-10 MED ORDER — HYDROCODONE-ACETAMINOPHEN 5-325 MG PO TABS: 1.0000 | ORAL_TABLET | Freq: Two times a day (BID) | ORAL | 0 refills | Status: DC | PRN

## 2022-04-12 ENCOUNTER — Other Ambulatory Visit: Payer: Self-pay | Admitting: Orthopedic Surgery

## 2022-04-13 MED ORDER — HYDROCODONE-ACETAMINOPHEN 5-325 MG PO TABS: 1.0000 | ORAL_TABLET | Freq: Two times a day (BID) | ORAL | 0 refills | Status: DC | PRN

## 2022-04-18 ENCOUNTER — Other Ambulatory Visit: Payer: Self-pay | Admitting: Orthopedic Surgery

## 2022-04-18 MED ORDER — HYDROCODONE-ACETAMINOPHEN 5-325 MG PO TABS: 1.0000 | ORAL_TABLET | Freq: Two times a day (BID) | ORAL | 0 refills | Status: DC | PRN

## 2022-04-24 ENCOUNTER — Other Ambulatory Visit: Payer: Self-pay | Admitting: Orthopedic Surgery

## 2022-04-24 MED ORDER — HYDROCODONE-ACETAMINOPHEN 5-325 MG PO TABS: 1.0000 | ORAL_TABLET | Freq: Two times a day (BID) | ORAL | 0 refills | Status: DC | PRN

## 2022-04-27 ENCOUNTER — Other Ambulatory Visit: Payer: Self-pay | Admitting: Orthopedic Surgery

## 2022-04-27 MED ORDER — HYDROCODONE-ACETAMINOPHEN 5-325 MG PO TABS: 1.0000 | ORAL_TABLET | Freq: Two times a day (BID) | ORAL | 0 refills | Status: DC | PRN

## 2022-05-03 ENCOUNTER — Other Ambulatory Visit: Payer: Self-pay | Admitting: Orthopedic Surgery

## 2022-05-04 MED ORDER — HYDROCODONE-ACETAMINOPHEN 5-325 MG PO TABS: 1.0000 | ORAL_TABLET | Freq: Two times a day (BID) | ORAL | 0 refills | Status: DC | PRN

## 2022-05-08 ENCOUNTER — Ambulatory Visit: Payer: Self-pay | Admitting: Orthopedic Surgery

## 2022-05-09 ENCOUNTER — Other Ambulatory Visit: Payer: Self-pay | Admitting: Orthopedic Surgery

## 2022-05-10 ENCOUNTER — Ambulatory Visit (INDEPENDENT_AMBULATORY_CARE_PROVIDER_SITE_OTHER): Payer: Self-pay | Admitting: Orthopedic Surgery

## 2022-05-10 ENCOUNTER — Encounter: Payer: Self-pay | Admitting: Orthopedic Surgery

## 2022-05-10 DIAGNOSIS — M62838 Other muscle spasm: Secondary | ICD-10-CM

## 2022-05-10 DIAGNOSIS — M792 Neuralgia and neuritis, unspecified: Secondary | ICD-10-CM

## 2022-05-10 DIAGNOSIS — M25511 Pain in right shoulder: Secondary | ICD-10-CM

## 2022-05-10 DIAGNOSIS — G8929 Other chronic pain: Secondary | ICD-10-CM

## 2022-05-10 DIAGNOSIS — M542 Cervicalgia: Secondary | ICD-10-CM

## 2022-05-10 DIAGNOSIS — Z9889 Other specified postprocedural states: Secondary | ICD-10-CM

## 2022-05-10 MED ORDER — GABAPENTIN 300 MG PO CAPS: ORAL_CAPSULE | ORAL | 5 refills | Status: AC

## 2022-05-10 MED ORDER — TIZANIDINE HCL 4 MG PO TABS: 4.0000 mg | ORAL_TABLET | Freq: Four times a day (QID) | ORAL | 0 refills | Status: DC | PRN

## 2022-05-10 MED ORDER — HYDROCODONE-ACETAMINOPHEN 5-325 MG PO TABS: 1.0000 | ORAL_TABLET | Freq: Two times a day (BID) | ORAL | 0 refills | Status: DC | PRN

## 2022-05-10 NOTE — Progress Notes (Signed)
Chief Complaint  Patient presents with   Shoulder Pain    Last surgery 04/2021   PROCEDURE:  Procedure(s): ARTHROSCOPY RIGHT SHOULDER WITH DISTAL CLAVICLE EXCISION (Right) With revision rotator cuff repair right shoulder  Follow-up visit  The patient's had 3 procedures on her right shoulder in the last 1 as noted above.  She is now off Plavix  She complains of upper right shoulder pain in the trapezius muscle with a dull ache.  She has been doing well with tapering the chronic opioids that she was on for 3 years because she could not take anti-inflammatories  She complains of pain shooting down the right arm and constant dull ache over the right trapezius muscle and superior aspect of the shoulder  She can lift her arm about 130 degrees she is tender in the neck I cannot reproduce the radicular symptoms but she has cervical disc disease on prior MRI in 2021  IMPRESSION: 1. Medial right foraminal disc osteophyte complex at C5-6 causes at least moderate right foraminal narrowing and probable right C6 nerve root encroachment. 2. Spondylosis at C6-7 without spinal stenosis or nerve root encroachment. 3. Mild foraminal narrowing bilaterally at C3-4 and on the right at C4-5. 4. No cord deformity or abnormal cord signal.     Electronically Signed   By: Richardean Sale M.D.   On: 02/15/2020 13:16    Recommend ESI  I refilled some of the medications and I will see her a week after injection  Meds ordered this encounter  Medications   tiZANidine (ZANAFLEX) 4 MG tablet    Sig: Take 1 tablet (4 mg total) by mouth every 6 (six) hours as needed for muscle spasms.    Dispense:  30 tablet    Refill:  0   gabapentin (NEURONTIN) 300 MG capsule    Sig: TAKE 1 CAPSULE(300 MG) BY MOUTH THREE TIMES DAILY    Dispense:  90 capsule    Refill:  5   HYDROcodone-acetaminophen (NORCO/VICODIN) 5-325 MG tablet    Sig: Take 1 tablet by mouth every 12 (twelve) hours as needed for moderate pain.     Dispense:  10 tablet    Refill:  0

## 2022-05-10 NOTE — Patient Instructions (Signed)
We are referring you to [facility] from [facility] Office address is [address] [place] The phone number is [phone]  The office will call you with an appointment Dr. Newton  

## 2022-05-15 ENCOUNTER — Other Ambulatory Visit: Payer: Self-pay | Admitting: Orthopedic Surgery

## 2022-05-15 DIAGNOSIS — G8929 Other chronic pain: Secondary | ICD-10-CM

## 2022-05-15 MED ORDER — HYDROCODONE-ACETAMINOPHEN 5-325 MG PO TABS: 1.0000 | ORAL_TABLET | Freq: Two times a day (BID) | ORAL | 0 refills | Status: DC | PRN

## 2022-05-16 ENCOUNTER — Other Ambulatory Visit: Payer: Self-pay | Admitting: Physical Medicine and Rehabilitation

## 2022-05-16 DIAGNOSIS — M5412 Radiculopathy, cervical region: Secondary | ICD-10-CM

## 2022-05-18 ENCOUNTER — Other Ambulatory Visit: Payer: Self-pay | Admitting: Orthopedic Surgery

## 2022-05-18 DIAGNOSIS — G8929 Other chronic pain: Secondary | ICD-10-CM

## 2022-05-18 MED ORDER — HYDROCODONE-ACETAMINOPHEN 5-325 MG PO TABS: 1.0000 | ORAL_TABLET | Freq: Two times a day (BID) | ORAL | 0 refills | Status: DC | PRN

## 2022-05-23 ENCOUNTER — Other Ambulatory Visit: Payer: Self-pay | Admitting: Orthopedic Surgery

## 2022-05-23 DIAGNOSIS — G8929 Other chronic pain: Secondary | ICD-10-CM

## 2022-05-23 DIAGNOSIS — M62838 Other muscle spasm: Secondary | ICD-10-CM

## 2022-05-23 MED ORDER — HYDROCODONE-ACETAMINOPHEN 5-325 MG PO TABS
1.0000 | ORAL_TABLET | Freq: Two times a day (BID) | ORAL | 0 refills | Status: DC | PRN
Start: 2022-05-23 — End: 2022-05-29

## 2022-05-23 MED ORDER — TIZANIDINE HCL 4 MG PO TABS: 4.0000 mg | ORAL_TABLET | Freq: Four times a day (QID) | ORAL | 0 refills | Status: DC | PRN

## 2022-05-29 ENCOUNTER — Encounter: Payer: Self-pay | Admitting: Physical Medicine and Rehabilitation

## 2022-05-29 ENCOUNTER — Other Ambulatory Visit: Payer: Self-pay | Admitting: Orthopedic Surgery

## 2022-05-29 DIAGNOSIS — G8929 Other chronic pain: Secondary | ICD-10-CM

## 2022-05-29 MED ORDER — HYDROCODONE-ACETAMINOPHEN 5-325 MG PO TABS: 1.0000 | ORAL_TABLET | Freq: Two times a day (BID) | ORAL | 0 refills | Status: DC | PRN

## 2022-06-02 ENCOUNTER — Other Ambulatory Visit: Payer: Self-pay | Admitting: Orthopedic Surgery

## 2022-06-02 DIAGNOSIS — M25511 Pain in right shoulder: Secondary | ICD-10-CM

## 2022-06-02 MED ORDER — HYDROCODONE-ACETAMINOPHEN 5-325 MG PO TABS: 1.0000 | ORAL_TABLET | Freq: Two times a day (BID) | ORAL | 0 refills | Status: DC | PRN

## 2022-06-05 ENCOUNTER — Telehealth: Payer: Self-pay

## 2022-06-08 ENCOUNTER — Other Ambulatory Visit: Payer: Self-pay | Admitting: Orthopedic Surgery

## 2022-06-08 DIAGNOSIS — G8929 Other chronic pain: Secondary | ICD-10-CM

## 2022-06-08 MED ORDER — HYDROCODONE-ACETAMINOPHEN 5-325 MG PO TABS: 1.0000 | ORAL_TABLET | Freq: Two times a day (BID) | ORAL | 0 refills | Status: DC | PRN

## 2022-06-08 NOTE — Telephone Encounter (Signed)
Ok next time I m not changing it

## 2022-06-13 ENCOUNTER — Encounter: Payer: Self-pay | Admitting: Physical Medicine and Rehabilitation

## 2022-06-20 NOTE — Telephone Encounter (Signed)
Return call to reschedule injection

## 2022-06-29 ENCOUNTER — Other Ambulatory Visit: Payer: Self-pay | Admitting: Orthopedic Surgery

## 2022-06-29 DIAGNOSIS — G8929 Other chronic pain: Secondary | ICD-10-CM

## 2022-06-29 MED ORDER — HYDROCODONE-ACETAMINOPHEN 5-325 MG PO TABS: 1.0000 | ORAL_TABLET | Freq: Two times a day (BID) | ORAL | 0 refills | Status: AC | PRN

## 2022-07-03 ENCOUNTER — Telehealth: Payer: Self-pay

## 2022-07-03 NOTE — Telephone Encounter (Signed)
Pt called stating that she'd seen Dr. Arbie Cookey this year and he said for her to f/u PRN, but she is requesting an appt.  Reviewed pt's chart, returned call for clarification, two identifiers used. Pt is c/o LLE claudication symptoms. She states that after she walks a short distance, her LLE feels as if she's run a marathon. She is nervous because that is the same leg that she had a blood clot and a subsequent big toe amputation. Appts scheduled for [place] and Dr. Arbie Cookey for this Wednesday, 12/13. Confirmed understanding.

## 2022-07-04 ENCOUNTER — Other Ambulatory Visit: Payer: Self-pay

## 2022-07-04 DIAGNOSIS — I739 Peripheral vascular disease, unspecified: Secondary | ICD-10-CM

## 2022-07-05 ENCOUNTER — Ambulatory Visit (INDEPENDENT_AMBULATORY_CARE_PROVIDER_SITE_OTHER): Payer: Self-pay

## 2022-07-05 ENCOUNTER — Ambulatory Visit (INDEPENDENT_AMBULATORY_CARE_PROVIDER_SITE_OTHER): Payer: Self-pay | Admitting: Vascular Surgery

## 2022-07-05 ENCOUNTER — Encounter: Payer: Self-pay | Admitting: Vascular Surgery

## 2022-07-05 VITALS — BP 115/77 | HR 97 | Temp 97.9°F | Ht 68.0 in | Wt 219.0 lb

## 2022-07-05 DIAGNOSIS — I739 Peripheral vascular disease, unspecified: Secondary | ICD-10-CM

## 2022-07-05 NOTE — Progress Notes (Signed)
[facility] [place]  Patient name: Tina Pham MRN: [id number] DOB: 02-22-1976 Sex: female  REASON FOR VISIT: Follow-up lower extremity arterial insufficiency  HPI: Tina Pham is a 46 y.o. female here today for follow-up.  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  She underwent left common iliac artery PTA and stenting with Dr. Donzetta Matters on 07/22/2020.  She also underwent left great toe amputation.  My last office visit with her May 2023, she had normal ankle arm indices bilaterally.  Duplex imaging of her stent revealed some stenosis at the distal aspect with maximal velocity of 330 cm/s.  She presents today with concern regarding total leg claudication.  This is making it difficult for her to walk.  She reports that even when coming in from the parking lot that she has to stop due to discomfort.  She does report some paresthesias in the toes of her left foot as well.  No frank tissue loss.  She does report if she continues to press that she can have occasional mild right calf claudication.  Her symptoms are total leg on the left  Past Medical History:  Diagnosis Date   Anxiety    panic attacks   Arthritis    Chronic back pain    Chronic bronchitis (HCC)    Depression    Diabetes mellitus    GERD (gastroesophageal reflux disease)    prn tums   High cholesterol    History of kidney stones    passed ?   Hypertension    no longer on BP medications   Migraine    Neuropathic pain of foot    Plantar fasciitis    Pneumonia 2011ish   Sciatica    Torn rotator cuff     Family History  Problem Relation Age of Onset   Heart failure Mother    COPD Mother    Diabetes Mother    Heart failure Father    Diabetes Other     SOCIAL HISTORY: Social History   Tobacco Use   Smoking status: Every Day    Packs/day: 1.00    Years: 31.00    Total pack years: 31.00    Types: Cigarettes   Smokeless tobacco:  Never  Substance Use Topics   Alcohol use: No    Allergies  Allergen Reactions   Codeine Other (See Comments)    Upset Stomach   Metformin Other (See Comments)    Will not take per MD advisement    Benadryl [Diphenhydramine Hcl] Palpitations   Compazine Palpitations    Current Outpatient Medications  Medication Sig Dispense Refill   Accu-Chek Softclix Lancets lancets SMARTSIG:Topical     aspirin EC 81 MG tablet Take 81 mg by mouth daily. Swallow whole.     atorvastatin (LIPITOR) 10 MG tablet TAKE 1 TABLET(10 MG) BY MOUTH DAILY 30 tablet 11   esomeprazole (NEXIUM) 20 MG capsule Take 20 mg by mouth daily as needed (acid reflux).     FEROSUL 325 (65 Fe) MG tablet Take 325 mg by mouth 2 (two) times daily.     gabapentin (NEURONTIN) 300 MG capsule TAKE 1 CAPSULE(300 MG) BY MOUTH THREE TIMES DAILY 90 capsule 5   glipiZIDE (GLUCOTROL) 10 MG tablet Take 10 mg by mouth in the morning.     HYDROcodone-acetaminophen (NORCO/VICODIN) 5-325 MG tablet Take 1 tablet by mouth every 12 (twelve) hours as needed for moderate pain. 60 tablet 0   tiZANidine (ZANAFLEX)  4 MG tablet Take 1 tablet (4 mg total) by mouth every 6 (six) hours as needed for muscle spasms. 30 tablet 0   cefadroxil (DURICEF) 500 MG capsule Take 1 capsule (500 mg total) by mouth 2 (two) times daily. (Patient not taking: Reported on 07/05/2022) 14 capsule 0   No current facility-administered medications for this visit.    REVIEW OF SYSTEMS:  [X]  denotes positive finding, [ ]  denotes negative finding Cardiac  Comments:  Chest pain or chest pressure:    Shortness of breath upon exertion:    Short of breath when lying flat:    Irregular heart rhythm:        Vascular    Pain in calf, thigh, or hip brought on by ambulation: x   Pain in feet at night that wakes you up from your sleep:     Blood clot in your veins:    Leg swelling:           PHYSICAL EXAM: Vitals:   07/05/22 1113  BP: 115/77  Pulse: 97  Temp: 97.9 F (36.6  C)  SpO2: 94%  Weight: 219 lb (99.3 kg)  Height: 5\' 8"  (1.727 m)    GENERAL: The patient is a well-nourished female, in no acute distress. The vital signs are documented above. CARDIOVASCULAR: I am able to palpate femoral pulses bilaterally.  She has 1+ dorsalis pedis pulses bilaterally PULMONARY: There is good air exchange  MUSCULOSKELETAL: There are no major deformities or cyanosis. NEUROLOGIC: No focal weakness or paresthesias are detected. SKIN: There are no ulcers or rashes noted. PSYCHIATRIC: The patient has a normal affect.  DATA:  Noninvasive studies today reveal ankle arm index of 0.87 on the right and 0.80 on the right with triphasic waveforms bilaterally  MEDICAL ISSUES: MR progression of her left leg claudication symptoms.  Prior duplex demonstrated stenosis in her left common iliac artery stent placed in 2021.  Feel that in all likelihood she has had a recurrent progressive stenosis of this. The neck step would be arteriography for further evaluation and probable reintervention of the left common iliac artery.  Do not feel that this is currently limb threatening but certainly is very severely limiting her ability to function.  Does have issues regarding insurance coverage and will discuss this with the appropriate parties at [facility].  We can schedule repeat arteriography and possible intervention with Dr. at her convenience     2022, MD [facility] Tel [phone]  Note: Portions of this report may have been transcribed using voice recognition software.  Every effort has been made to ensure accuracy; however, inadvertent computerized transcription errors may still be present.

## 2022-07-06 ENCOUNTER — Other Ambulatory Visit: Payer: Self-pay | Admitting: Orthopedic Surgery

## 2022-07-06 DIAGNOSIS — G8929 Other chronic pain: Secondary | ICD-10-CM

## 2022-07-06 DIAGNOSIS — M62838 Other muscle spasm: Secondary | ICD-10-CM

## 2022-07-06 MED ORDER — TIZANIDINE HCL 4 MG PO TABS: 4.0000 mg | ORAL_TABLET | Freq: Four times a day (QID) | ORAL | 0 refills | Status: DC | PRN

## 2022-07-10 ENCOUNTER — Ambulatory Visit (INDEPENDENT_AMBULATORY_CARE_PROVIDER_SITE_OTHER): Payer: Self-pay | Admitting: Physical Medicine and Rehabilitation

## 2022-07-10 ENCOUNTER — Ambulatory Visit: Payer: Self-pay

## 2022-07-10 VITALS — BP 126/83 | HR 84

## 2022-07-10 DIAGNOSIS — M5412 Radiculopathy, cervical region: Secondary | ICD-10-CM

## 2022-07-10 MED ORDER — METHYLPREDNISOLONE ACETATE 80 MG/ML IJ SUSP
80.0000 mg | Freq: Once | INTRAMUSCULAR | Status: AC
  Administered 2022-07-10: 80 mg

## 2022-07-10 NOTE — Patient Instructions (Signed)

## 2022-07-10 NOTE — Progress Notes (Signed)
Numeric Pain Rating Scale and Functional Assessment Average Pain 5   In the last MONTH (on 0-10 scale) has pain interfered with the following?  1. General activity like being  able to carry out your everyday physical activities such as walking, climbing stairs, carrying groceries, or moving a chair?  Rating( varies )   +Driver, -BT, -Dye Allergies.  Neck pain on right side that radiates into right shoulder into arm

## 2022-07-19 NOTE — Progress Notes (Signed)
Tina Pham - 46 y.o. female MRN [id number]  Date of birth: 1975/08/30  Office Visit Note: Visit Date: 07/10/2022 PCP: Waldon Reining, MD Referred by: Waldon Reining, MD  Subjective: Chief Complaint  Patient presents with   Neck - Pain   HPI:  Tina Pham is a 46 y.o. female who comes in today at the request of Dr. Fuller Canada for planned Right C7-T1 Cervical Interlaminar epidural steroid injection with fluoroscopic guidance.  The patient has failed conservative care including home exercise, medications, time and activity modification.  This injection will be diagnostic and hopefully therapeutic.  Please see requesting physician notes for further details and justification.   ROS Otherwise per HPI.  Assessment & Plan: Visit Diagnoses:    ICD-10-CM   1. Radiculopathy, cervical region  M54.12 XR C-ARM NO REPORT    Epidural Steroid injection    methylPREDNISolone acetate (DEPO-MEDROL) injection 80 mg      Plan: No additional findings.   Meds & Orders:  Meds ordered this encounter  Medications   methylPREDNISolone acetate (DEPO-MEDROL) injection 80 mg    Orders Placed This Encounter  Procedures   XR C-ARM NO REPORT   Epidural Steroid injection    Follow-up: Return for visit to requesting provider as needed.   Procedures: No procedures performed  Cervical Epidural Steroid Injection - Interlaminar Approach with Fluoroscopic Guidance  Patient: Tina Pham      Date of Birth: 1975/11/14 MRN: [id number] PCP: Waldon Reining, MD      Visit Date: 07/10/2022   Universal Protocol:    Date/Time: 07/19/2309:12 AM  Consent Given By: the patient  Position: PRONE  Additional Comments: Vital signs were monitored before and after the procedure. Patient was prepped and draped in the usual sterile fashion. The correct patient, procedure, and site was verified.   Injection Procedure Details:   Procedure diagnoses: Radiculopathy, cervical  region [M54.12]    Meds Administered:  Meds ordered this encounter  Medications   methylPREDNISolone acetate (DEPO-MEDROL) injection 80 mg     Laterality: Right  Location/Site: C7-T1  Needle: 3.5 in., 20 ga. Tuohy  Needle Placement: Paramedian epidural space  Findings:  -Comments: Excellent flow of contrast into the epidural space.  Procedure Details: Using a paramedian approach from the side mentioned above, the region overlying the inferior lamina was localized under fluoroscopic visualization and the soft tissues overlying this structure were infiltrated with 4 ml. of 1% Lidocaine without Epinephrine. A # 20 gauge, Tuohy needle was inserted into the epidural space using a paramedian approach.  The epidural space was localized using loss of resistance along with contralateral oblique bi-planar fluoroscopic views.  After negative aspirate for air, blood, and CSF, a 2 ml. volume of Isovue-250 was injected into the epidural space and the flow of contrast was observed. Radiographs were obtained for documentation purposes.   The injectate was administered into the level noted above.  Additional Comments:  The patient tolerated the procedure well Dressing: 2 x 2 sterile gauze and Band-Aid    Post-procedure details: Patient was observed during the procedure. Post-procedure instructions were reviewed.  Patient left the clinic in stable condition.   Clinical History: MRI CERVICAL SPINE WITHOUT CONTRAST   TECHNIQUE: Multiplanar, multisequence MR imaging of the cervical spine was performed. No intravenous contrast was administered.   COMPARISON:  Radiographs 03/24/2019.  CT cervical spine 02/22/2012   FINDINGS: Alignment: Near anatomic. No significant listhesis or focal angulation.   Vertebrae: No acute or suspicious osseous findings.  Cord: Normal in signal and caliber.   Posterior Fossa, vertebral arteries, paraspinal tissues: Visualized portions of the posterior  fossa appear unremarkable. No significant paraspinal findings. Bilateral vertebral artery flow voids.   Disc levels:   C2-3: Normal interspace.   C3-4: Mild bilateral uncinate spurring and left-sided facet hypertrophy. Mild foraminal narrowing bilaterally. No cord deformity.   C4-5: Mild bilateral uncinate spurring and mild right foraminal narrowing. No cord deformity.   C5-6: Medial right foraminal disc osteophyte complex causes at least moderate right foraminal narrowing and probable right C6 nerve root encroachment. The left foramen is patent. No cord deformity.   C6-7: Spondylosis with disc bulging, posterior osteophytes and mild uncinate spurring bilaterally. No cord deformity or significant foraminal compromise.   C7-T1: Normal interspace.   IMPRESSION: 1. Medial right foraminal disc osteophyte complex at C5-6 causes at least moderate right foraminal narrowing and probable right C6 nerve root encroachment. 2. Spondylosis at C6-7 without spinal stenosis or nerve root encroachment. 3. Mild foraminal narrowing bilaterally at C3-4 and on the right at C4-5. 4. No cord deformity or abnormal cord signal.     Electronically Signed   By: Richardean Sale M.D.   On: 02/15/2020 13:16     Objective:  VS:  HT:    WT:   BMI:     BP:126/83  HR:84bpm  TEMP: ( )  RESP:  Physical Exam Vitals and nursing note reviewed.  Constitutional:      General: She is not in acute distress.    Appearance: Normal appearance. She is not ill-appearing.  HENT:     Head: Normocephalic and atraumatic.     Right Ear: External ear normal.     Left Ear: External ear normal.  Eyes:     Extraocular Movements: Extraocular movements intact.  Cardiovascular:     Rate and Rhythm: Normal rate.     Pulses: Normal pulses.  Musculoskeletal:     Cervical back: Tenderness present. No rigidity.     Right lower leg: No edema.     Left lower leg: No edema.     Comments: Patient has good strength in  the upper extremities including 5 out of 5 strength in wrist extension long finger flexion and APB.  There is no atrophy of the hands intrinsically.  There is a negative Hoffmann's test.   Lymphadenopathy:     Cervical: No cervical adenopathy.  Skin:    Findings: No erythema, lesion or rash.  Neurological:     General: No focal deficit present.     Mental Status: She is alert and oriented to person, place, and time.     Sensory: No sensory deficit.     Motor: No weakness or abnormal muscle tone.     Coordination: Coordination normal.  Psychiatric:        Mood and Affect: Mood normal.        Behavior: Behavior normal.      Imaging: No results found.

## 2022-07-19 NOTE — Procedures (Signed)
Cervical Epidural Steroid Injection - Interlaminar Approach with Fluoroscopic Guidance  Patient: Tina Pham      Date of Birth: Apr 11, 1976 MRN: [id number] PCP: Waldon Reining, MD      Visit Date: 07/10/2022   Universal Protocol:    Date/Time: 07/19/2309:12 AM  Consent Given By: the patient  Position: PRONE  Additional Comments: Vital signs were monitored before and after the procedure. Patient was prepped and draped in the usual sterile fashion. The correct patient, procedure, and site was verified.   Injection Procedure Details:   Procedure diagnoses: Radiculopathy, cervical region [M54.12]    Meds Administered:  Meds ordered this encounter  Medications   methylPREDNISolone acetate (DEPO-MEDROL) injection 80 mg     Laterality: Right  Location/Site: C7-T1  Needle: 3.5 in., 20 ga. Tuohy  Needle Placement: Paramedian epidural space  Findings:  -Comments: Excellent flow of contrast into the epidural space.  Procedure Details: Using a paramedian approach from the side mentioned above, the region overlying the inferior lamina was localized under fluoroscopic visualization and the soft tissues overlying this structure were infiltrated with 4 ml. of 1% Lidocaine without Epinephrine. A # 20 gauge, Tuohy needle was inserted into the epidural space using a paramedian approach.  The epidural space was localized using loss of resistance along with contralateral oblique bi-planar fluoroscopic views.  After negative aspirate for air, blood, and CSF, a 2 ml. volume of Isovue-250 was injected into the epidural space and the flow of contrast was observed. Radiographs were obtained for documentation purposes.   The injectate was administered into the level noted above.  Additional Comments:  The patient tolerated the procedure well Dressing: 2 x 2 sterile gauze and Band-Aid    Post-procedure details: Patient was observed during the procedure. Post-procedure  instructions were reviewed.  Patient left the clinic in stable condition.

## 2022-08-07 ENCOUNTER — Encounter: Payer: Self-pay | Admitting: Orthopedic Surgery

## 2022-08-07 ENCOUNTER — Other Ambulatory Visit: Payer: Self-pay

## 2022-08-08 MED ORDER — HYDROCODONE-ACETAMINOPHEN 5-325 MG PO TABS: 1.0000 | ORAL_TABLET | Freq: Four times a day (QID) | ORAL | 0 refills | Status: DC | PRN

## 2022-08-10 ENCOUNTER — Ambulatory Visit (INDEPENDENT_AMBULATORY_CARE_PROVIDER_SITE_OTHER): Payer: Self-pay | Admitting: Orthopedic Surgery

## 2022-08-10 DIAGNOSIS — M5412 Radiculopathy, cervical region: Secondary | ICD-10-CM

## 2022-08-10 DIAGNOSIS — M542 Cervicalgia: Secondary | ICD-10-CM

## 2022-08-10 NOTE — Progress Notes (Signed)
Chief Complaint  Patient presents with   Neck Pain    Pt has had 1 epidural and states it helped for a few days. Here to f/u    Symptoms and MRI prior to epidural She complains of upper right shoulder pain in the trapezius muscle with a dull ache.  She has been doing well with tapering the chronic opioids that she was on for 3 years because she could not take anti-inflammatories   She complains of pain shooting down the right arm and constant dull ache over the right trapezius muscle and superior aspect of the shoulder   She can lift her arm about 130 degrees she is tender in the neck I cannot reproduce the radicular symptoms but she has cervical disc disease on prior MRI in 2021   IMPRESSION: 1. Medial right foraminal disc osteophyte complex at C5-6 causes at least moderate right foraminal narrowing and probable right C6 nerve root encroachment. 2. Spondylosis at C6-7 without spinal stenosis or nerve root encroachment. 3. Mild foraminal narrowing bilaterally at C3-4 and on the right at C4-5. 4. No cord deformity or abnormal cord signal.  Guinevere says that the epidural helped for couple days and then she was right back to having neck and upper trapezius pain  She still having a clicking sensation in the right shoulder status post 3 procedures.  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  In the subacromial space she had a small tear which  required small repair.  So at this point I do not think she needs any more shoulder surgery  She is taking tizanidine and hydrocodone for pain  I will get a consult with neurosurgery to address the findings noted on MRI above

## 2022-08-10 NOTE — Patient Instructions (Signed)
REFERRAL TO DR Lorin Mercy

## 2022-08-14 ENCOUNTER — Other Ambulatory Visit: Payer: Self-pay | Admitting: Orthopedic Surgery

## 2022-08-14 MED ORDER — HYDROCODONE-ACETAMINOPHEN 5-325 MG PO TABS: 1.0000 | ORAL_TABLET | Freq: Four times a day (QID) | ORAL | 0 refills | Status: DC | PRN

## 2022-08-21 ENCOUNTER — Other Ambulatory Visit: Payer: Self-pay | Admitting: Orthopedic Surgery

## 2022-08-22 MED ORDER — HYDROCODONE-ACETAMINOPHEN 5-325 MG PO TABS: 1.0000 | ORAL_TABLET | Freq: Four times a day (QID) | ORAL | 0 refills | Status: DC | PRN

## 2022-08-24 ENCOUNTER — Ambulatory Visit: Payer: Self-pay | Admitting: Orthopaedic Surgery

## 2022-08-29 ENCOUNTER — Other Ambulatory Visit: Payer: Self-pay | Admitting: Orthopedic Surgery

## 2022-08-29 MED ORDER — HYDROCODONE-ACETAMINOPHEN 5-325 MG PO TABS: 1.0000 | ORAL_TABLET | Freq: Four times a day (QID) | ORAL | 0 refills | Status: DC | PRN

## 2022-09-05 ENCOUNTER — Other Ambulatory Visit: Payer: Self-pay | Admitting: Orthopedic Surgery

## 2022-09-05 MED ORDER — HYDROCODONE-ACETAMINOPHEN 5-325 MG PO TABS: 1.0000 | ORAL_TABLET | Freq: Four times a day (QID) | ORAL | 0 refills | Status: DC | PRN

## 2022-09-12 ENCOUNTER — Other Ambulatory Visit: Payer: Self-pay | Admitting: Orthopedic Surgery

## 2022-09-12 DIAGNOSIS — G8929 Other chronic pain: Secondary | ICD-10-CM

## 2022-09-12 DIAGNOSIS — M62838 Other muscle spasm: Secondary | ICD-10-CM

## 2022-09-13 MED ORDER — TIZANIDINE HCL 4 MG PO TABS: 4.0000 mg | ORAL_TABLET | Freq: Four times a day (QID) | ORAL | 0 refills | Status: DC | PRN

## 2022-09-13 MED ORDER — HYDROCODONE-ACETAMINOPHEN 5-325 MG PO TABS: 1.0000 | ORAL_TABLET | Freq: Four times a day (QID) | ORAL | 0 refills | Status: DC | PRN

## 2022-09-18 ENCOUNTER — Other Ambulatory Visit: Payer: Self-pay | Admitting: Orthopedic Surgery

## 2022-09-18 MED ORDER — HYDROCODONE-ACETAMINOPHEN 5-325 MG PO TABS
1.0000 | ORAL_TABLET | Freq: Four times a day (QID) | ORAL | 0 refills | Status: DC | PRN
Start: 2022-09-18 — End: 2022-09-25

## 2022-09-21 ENCOUNTER — Encounter: Payer: Self-pay | Admitting: Radiology

## 2022-09-25 ENCOUNTER — Emergency Department (HOSPITAL_COMMUNITY): Payer: Self-pay

## 2022-09-25 ENCOUNTER — Other Ambulatory Visit: Payer: Self-pay

## 2022-09-25 ENCOUNTER — Encounter (HOSPITAL_COMMUNITY): Payer: Self-pay

## 2022-09-25 ENCOUNTER — Other Ambulatory Visit: Payer: Self-pay | Admitting: Orthopedic Surgery

## 2022-09-25 ENCOUNTER — Emergency Department (HOSPITAL_COMMUNITY)
Admission: EM | Admit: 2022-09-25 | Discharge: 2022-09-26 | Disposition: A | Discharge status: Home / Self Care | Payer: Self-pay | Attending: Emergency Medicine | Admitting: Emergency Medicine

## 2022-09-25 DIAGNOSIS — Z1152 Encounter for screening for COVID-19: Secondary | ICD-10-CM | POA: Insufficient documentation

## 2022-09-25 DIAGNOSIS — J019 Acute sinusitis, unspecified: Secondary | ICD-10-CM | POA: Insufficient documentation

## 2022-09-25 DIAGNOSIS — Z7982 Long term (current) use of aspirin: Secondary | ICD-10-CM | POA: Insufficient documentation

## 2022-09-25 DIAGNOSIS — L03115 Cellulitis of right lower limb: Secondary | ICD-10-CM | POA: Insufficient documentation

## 2022-09-25 DIAGNOSIS — I10 Essential (primary) hypertension: Secondary | ICD-10-CM | POA: Insufficient documentation

## 2022-09-25 DIAGNOSIS — Z79899 Other long term (current) drug therapy: Secondary | ICD-10-CM | POA: Insufficient documentation

## 2022-09-25 DIAGNOSIS — Z7984 Long term (current) use of oral hypoglycemic drugs: Secondary | ICD-10-CM | POA: Insufficient documentation

## 2022-09-25 DIAGNOSIS — L03031 Cellulitis of right toe: Secondary | ICD-10-CM

## 2022-09-25 DIAGNOSIS — E119 Type 2 diabetes mellitus without complications: Secondary | ICD-10-CM | POA: Insufficient documentation

## 2022-09-25 LAB — CBG MONITORING, ED: Glucose-Capillary: 298 mg/dL — ABNORMAL HIGH (ref 70–99)

## 2022-09-25 LAB — RESP PANEL BY RT-PCR (RSV, FLU A&B, COVID)  RVPGX2
Influenza A by PCR: NEGATIVE
Influenza B by PCR: NEGATIVE
Resp Syncytial Virus by PCR: NEGATIVE
SARS Coronavirus 2 by RT PCR: NEGATIVE

## 2022-09-25 LAB — GROUP A STREP BY PCR: Group A Strep by PCR: NOT DETECTED

## 2022-09-25 MED ORDER — HYDROCODONE-ACETAMINOPHEN 5-325 MG PO TABS: 1.0000 | ORAL_TABLET | Freq: Four times a day (QID) | ORAL | 0 refills | Status: DC | PRN

## 2022-09-25 MED ORDER — GLIPIZIDE 5 MG PO TABS
10.0000 mg | ORAL_TABLET | Freq: Once | ORAL | Status: AC
  Administered 2022-09-26: 10 mg via ORAL
  Filled 2022-09-25: qty 2

## 2022-09-25 MED ORDER — DOXYCYCLINE HYCLATE 100 MG PO TABS
100.0000 mg | ORAL_TABLET | Freq: Once | ORAL | Status: AC
  Administered 2022-09-26: 100 mg via ORAL
  Filled 2022-09-25: qty 1

## 2022-09-25 MED ORDER — IBUPROFEN 800 MG PO TABS
800.0000 mg | ORAL_TABLET | Freq: Once | ORAL | Status: AC
  Administered 2022-09-25: 800 mg via ORAL
  Filled 2022-09-25: qty 1

## 2022-09-25 MED ORDER — DOXYCYCLINE HYCLATE 100 MG PO CAPS: 100.0000 mg | ORAL_CAPSULE | Freq: Two times a day (BID) | ORAL | 0 refills | Status: DC

## 2022-09-25 NOTE — ED Notes (Signed)
Radiology at bedside for xrays.

## 2022-09-25 NOTE — ED Triage Notes (Signed)
Pt c/o flu like symptoms since last weak. Endorses cough, congestion, body aches, nausea, vomiting, headache. No fevers.

## 2022-09-25 NOTE — Discharge Instructions (Addendum)
Take the entire course of the antibiotics prescribed.  It appears this medication should cost about $8 using your good Rx coupon at [employer].  Soak your toe for 10 minutes twice a day in warm soapy water, then dry completely.  You will be important for you to get close follow-up and recheck of this toe infection if it is not responding to the antibiotics as discussed.  Your blood glucose level is elevated tonight - it is important for you to be taking your glipizide.

## 2022-09-25 NOTE — ED Notes (Signed)
Pt ambulated to bathroom to void. In NAD.

## 2022-09-25 NOTE — ED Provider Notes (Signed)
[facility] Provider Note   CSN: [id number] Arrival date & time: 09/25/22  1853     History {Add pertinent medical, surgical, social history, OB history to HPI:1} No chief complaint on file.   Tina Pham is a 47 y.o. female.  The history is provided by the patient.       Home Medications Prior to Admission medications   Medication Sig Start Date End Date Taking? Authorizing Provider  Accu-Chek Softclix Lancets lancets SMARTSIG:Topical 04/27/21   [provider]  aspirin EC 81 MG tablet Take 81 mg by mouth daily. Swallow whole.    [provider]  atorvastatin (LIPITOR) 10 MG tablet TAKE 1 TABLET(10 MG) BY MOUTH DAILY 09/26/21   Waynetta Sandy, MD  cefadroxil (DURICEF) 500 MG capsule Take 1 capsule (500 mg total) by mouth 2 (two) times daily. Patient not taking: Reported on 07/10/2022 03/16/22   Prosperi, Christian H, PA-C  esomeprazole (NEXIUM) 20 MG capsule Take 20 mg by mouth daily as needed (acid reflux).    [provider]  FEROSUL 325 (65 Fe) MG tablet Take 325 mg by mouth 2 (two) times daily. 04/27/21   [provider]  gabapentin (NEURONTIN) 300 MG capsule TAKE 1 CAPSULE(300 MG) BY MOUTH THREE TIMES DAILY 05/10/22   Carole Civil, MD  glipiZIDE (GLUCOTROL) 10 MG tablet Take 10 mg by mouth in the morning. 04/13/21   [provider]  HYDROcodone-acetaminophen (NORCO/VICODIN) 5-325 MG tablet Take 1 tablet by mouth every 6 (six) hours as needed for moderate pain. 09/25/22   Carole Civil, MD  tiZANidine (ZANAFLEX) 4 MG tablet Take 1 tablet (4 mg total) by mouth every 6 (six) hours as needed for muscle spasms. 09/13/22   Carole Civil, MD  metFORMIN (GLUCOPHAGE) 500 MG tablet Take 1 tablet (500 mg total) by mouth 2 (two) times daily with a meal. 06/15/20 06/15/20  Fransico Meadow, PA-C      Allergies    Codeine, Metformin, Benadryl [diphenhydramine hcl], and  Compazine    Review of Systems   Review of Systems  Physical Exam Updated Vital Signs BP (!) 144/95   Pulse 87   Temp 98.2 F (36.8 C) (Oral)   Resp 18   Wt 90.7 kg   SpO2 96%   BMI 30.41 kg/m  Physical Exam  ED Results / Procedures / Treatments   Labs (all labs ordered are listed, but only abnormal results are displayed) Labs Reviewed  RESP PANEL BY RT-PCR (RSV, FLU A&B, COVID)  [id number]  GROUP A STREP BY PCR  CBG MONITORING, ED    EKG None  Radiology No results found.  Procedures Procedures  {Document cardiac monitor, telemetry assessment procedure when appropriate:1}  Medications Ordered in ED Medications  ibuprofen (ADVIL) tablet 800 mg (has no administration in time range)    ED Course/ Medical Decision Making/ A&P   {   Click here for ABCD2, HEART and other calculatorsREFRESH Note before signing :1}                          Medical Decision Making Amount and/or Complexity of Data Reviewed Radiology: ordered.  Risk Prescription drug management.   ***  {Document critical care time when appropriate:1} {Document review of labs and clinical decision tools ie heart score, Chads2Vasc2 etc:1}  {Document your independent review of radiology images, and any outside records:1} {Document your discussion with family members, caretakers, and  with consultants:1} {Document social determinants of health affecting pt's care:1} {Document your decision making why or why not admission, treatments were needed:1} Final Clinical Impression(s) / ED Diagnoses Final diagnoses:  None    Rx / DC Orders ED Discharge Orders     None

## 2022-09-28 ENCOUNTER — Ambulatory Visit (INDEPENDENT_AMBULATORY_CARE_PROVIDER_SITE_OTHER): Payer: Self-pay | Admitting: Orthopaedic Surgery

## 2022-09-28 ENCOUNTER — Ambulatory Visit (INDEPENDENT_AMBULATORY_CARE_PROVIDER_SITE_OTHER): Payer: Self-pay

## 2022-09-28 ENCOUNTER — Other Ambulatory Visit: Payer: Self-pay

## 2022-09-28 ENCOUNTER — Encounter: Payer: Self-pay | Admitting: Orthopaedic Surgery

## 2022-09-28 ENCOUNTER — Emergency Department (HOSPITAL_COMMUNITY): Payer: Self-pay

## 2022-09-28 ENCOUNTER — Inpatient Hospital Stay (HOSPITAL_COMMUNITY)
Admission: EM | Admit: 2022-09-28 | Discharge: 2022-09-29 | DRG: 639 | Disposition: A | Discharge status: Home / Self Care | Payer: Self-pay | Attending: Internal Medicine | Admitting: Internal Medicine

## 2022-09-28 VITALS — Ht 68.0 in | Wt 220.0 lb

## 2022-09-28 DIAGNOSIS — Z89412 Acquired absence of left great toe: Secondary | ICD-10-CM

## 2022-09-28 DIAGNOSIS — K219 Gastro-esophageal reflux disease without esophagitis: Secondary | ICD-10-CM

## 2022-09-28 DIAGNOSIS — D649 Anemia, unspecified: Secondary | ICD-10-CM | POA: Diagnosis present

## 2022-09-28 DIAGNOSIS — S91101A Unspecified open wound of right great toe without damage to nail, initial encounter: Secondary | ICD-10-CM

## 2022-09-28 DIAGNOSIS — I10 Essential (primary) hypertension: Secondary | ICD-10-CM | POA: Diagnosis present

## 2022-09-28 DIAGNOSIS — F410 Panic disorder [episodic paroxysmal anxiety] without agoraphobia: Secondary | ICD-10-CM | POA: Diagnosis present

## 2022-09-28 DIAGNOSIS — E1165 Type 2 diabetes mellitus with hyperglycemia: Secondary | ICD-10-CM

## 2022-09-28 DIAGNOSIS — Z8249 Family history of ischemic heart disease and other diseases of the circulatory system: Secondary | ICD-10-CM

## 2022-09-28 DIAGNOSIS — F32A Depression, unspecified: Secondary | ICD-10-CM | POA: Diagnosis present

## 2022-09-28 DIAGNOSIS — Z7984 Long term (current) use of oral hypoglycemic drugs: Secondary | ICD-10-CM

## 2022-09-28 DIAGNOSIS — M542 Cervicalgia: Secondary | ICD-10-CM

## 2022-09-28 DIAGNOSIS — Z7982 Long term (current) use of aspirin: Secondary | ICD-10-CM

## 2022-09-28 DIAGNOSIS — Z885 Allergy status to narcotic agent status: Secondary | ICD-10-CM

## 2022-09-28 DIAGNOSIS — Z79899 Other long term (current) drug therapy: Secondary | ICD-10-CM

## 2022-09-28 DIAGNOSIS — E11628 Type 2 diabetes mellitus with other skin complications: Principal | ICD-10-CM | POA: Diagnosis present

## 2022-09-28 DIAGNOSIS — Z833 Family history of diabetes mellitus: Secondary | ICD-10-CM

## 2022-09-28 DIAGNOSIS — E782 Mixed hyperlipidemia: Secondary | ICD-10-CM | POA: Diagnosis present

## 2022-09-28 DIAGNOSIS — R238 Other skin changes: Secondary | ICD-10-CM | POA: Diagnosis present

## 2022-09-28 DIAGNOSIS — X58XXXA Exposure to other specified factors, initial encounter: Secondary | ICD-10-CM | POA: Diagnosis present

## 2022-09-28 DIAGNOSIS — E1142 Type 2 diabetes mellitus with diabetic polyneuropathy: Secondary | ICD-10-CM | POA: Diagnosis present

## 2022-09-28 DIAGNOSIS — M1990 Unspecified osteoarthritis, unspecified site: Secondary | ICD-10-CM | POA: Diagnosis present

## 2022-09-28 DIAGNOSIS — Z888 Allergy status to other drugs, medicaments and biological substances status: Secondary | ICD-10-CM

## 2022-09-28 DIAGNOSIS — F17210 Nicotine dependence, cigarettes, uncomplicated: Secondary | ICD-10-CM | POA: Diagnosis present

## 2022-09-28 DIAGNOSIS — Z6832 Body mass index (BMI) 32.0-32.9, adult: Secondary | ICD-10-CM

## 2022-09-28 DIAGNOSIS — L03031 Cellulitis of right toe: Secondary | ICD-10-CM

## 2022-09-28 DIAGNOSIS — E669 Obesity, unspecified: Secondary | ICD-10-CM

## 2022-09-28 DIAGNOSIS — G629 Polyneuropathy, unspecified: Secondary | ICD-10-CM

## 2022-09-28 LAB — CBC WITH DIFFERENTIAL/PLATELET
Abs Immature Granulocytes: 0.02 10*3/uL (ref 0.00–0.07)
Basophils Absolute: 0.1 10*3/uL (ref 0.0–0.1)
Basophils Relative: 1 %
Eosinophils Absolute: 0.2 10*3/uL (ref 0.0–0.5)
Eosinophils Relative: 3 %
HCT: 33.5 % — ABNORMAL LOW (ref 36.0–46.0)
Hemoglobin: 10.1 g/dL — ABNORMAL LOW (ref 12.0–15.0)
Immature Granulocytes: 0 %
Lymphocytes Relative: 33 %
Lymphs Abs: 2.4 10*3/uL (ref 0.7–4.0)
MCH: 24.2 pg — ABNORMAL LOW (ref 26.0–34.0)
MCHC: 30.1 g/dL (ref 30.0–36.0)
MCV: 80.3 fL (ref 80.0–100.0)
Monocytes Absolute: 0.5 10*3/uL (ref 0.1–1.0)
Monocytes Relative: 7 %
Neutro Abs: 4.1 10*3/uL (ref 1.7–7.7)
Neutrophils Relative %: 56 %
Platelets: 359 10*3/uL (ref 150–400)
RBC: 4.17 MIL/uL (ref 3.87–5.11)
RDW: 15 % (ref 11.5–15.5)
WBC: 7.3 10*3/uL (ref 4.0–10.5)
nRBC: 0 % (ref 0.0–0.2)

## 2022-09-28 LAB — COMPREHENSIVE METABOLIC PANEL
ALT: 11 U/L (ref 0–44)
AST: 17 U/L (ref 15–41)
Albumin: 3.8 g/dL (ref 3.5–5.0)
Alkaline Phosphatase: 59 U/L (ref 38–126)
Anion gap: 10 (ref 5–15)
BUN: 15 mg/dL (ref 6–20)
CO2: 24 mmol/L (ref 22–32)
Calcium: 8.7 mg/dL — ABNORMAL LOW (ref 8.9–10.3)
Chloride: 102 mmol/L (ref 98–111)
Creatinine, Ser: 0.55 mg/dL (ref 0.44–1.00)
GFR, Estimated: >60 mL/min (ref >60)
Glucose, Bld: 271 mg/dL — ABNORMAL HIGH (ref 70–99)
Potassium: 3.6 mmol/L (ref 3.5–5.1)
Sodium: 136 mmol/L (ref 135–145)
Total Bilirubin: 0.5 mg/dL (ref 0.3–1.2)
Total Protein: 7.2 g/dL (ref 6.5–8.1)

## 2022-09-28 MED ORDER — VANCOMYCIN HCL IN DEXTROSE 1-5 GM/200ML-% IV SOLN
1000.0000 mg | Freq: Once | INTRAVENOUS | Status: AC
  Administered 2022-09-28: 1000 mg via INTRAVENOUS
  Filled 2022-09-28: qty 200

## 2022-09-28 NOTE — Progress Notes (Signed)
Office Visit Note   Patient: Tina Pham           Date of Birth: Oct 17, 1975           MRN: [id number] Visit Date: 09/28/2022              Requested by: Carole Civil, MD [address] [place],  [place] [address] PCP: Leonie Douglas, MD   Assessment & Plan: Visit Diagnoses:  1. Neck pain     Plan: Skin is callus debridement as well to work on this.  Better diabetic control.  She can return in 6 weeks.  She has appoint with her PCP coming up shortly.  Once her A1c is below 7 5 she can get a new MRI scan if she still has progressive foraminal compression she be a candidate for anterior cervical fusion.  We discussed smoking cessation since she is already had arterial blockage loss to toe has pulmonary problems and also diabetes.  Risk of stroke heart attack discussed.  Neck radiograph shows carotid bifurcation calcification bilaterally.  Follow-Up Instructions: No follow-ups on file.   Orders:  Orders Placed This Encounter  Procedures   XR Cervical Spine 2 or 3 views   No orders of the defined types were placed in this encounter.     Procedures: No procedures performed   Clinical Data: No additional findings.   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  No recent A1c's last one 1 year ago was 7.8 prior to that 9.6.  Had a history of being on pain medication in the past.  Recent great toe infection with callus over the great toe and cellulitis.  She is currently on doxycycline.  She [facility]  emergency [facility].  PDMP review shows she has been on hydrocodone at least 30 tablets monthly sometimes more.  Patient continues to smoke.  She lives alone has children and grandchildren.  Epidural with Dr. Ernestina Patches 07/11/2022 did not give her any relief.  Review of Systems all the systems noncontributory HPI.   Objective: Vital Signs: Ht '5\' 8"'$  (1.727 m)   Wt 220 lb (99.8 kg)   BMI 33.45 kg/m   Physical Exam Constitutional:      Appearance: She is well-developed.  HENT:     Head: Normocephalic.     Right Ear: External ear normal.     Left Ear: External ear normal. There is no impacted cerumen.  Eyes:     Pupils: Pupils are equal, round, and reactive to light.  Neck:     Thyroid: No thyromegaly.     Trachea: No tracheal deviation.  Cardiovascular:     Rate and Rhythm: Normal rate.  Pulmonary:     Effort: Pulmonary effort is normal.  Abdominal:     Palpations: Abdomen is soft.  Musculoskeletal:     Cervical back: No rigidity.  Skin:    General: Skin is warm and dry.  Neurological:     Mental Status: She is alert  and oriented to person, place, and time.  Psychiatric:        Behavior: Behavior normal.     Ortho Exam some cellulitis of the right great toe with callus old underlying blood blister.  Knees reach full extension negative straight leg raising.  She has significant brachial plexus tenderness on the right.  No thenar atrophy.  Decreased reflexes brachial radialis on the right.  Specialty Comments:  MRI CERVICAL SPINE WITHOUT CONTRAST   TECHNIQUE: Multiplanar, multisequence MR imaging of the cervical spine was performed. No intravenous contrast was administered.   COMPARISON:  Radiographs 03/24/2019.  CT cervical spine 02/22/2012   FINDINGS: Alignment: Near anatomic. No significant listhesis or focal angulation.   Vertebrae: No acute or suspicious osseous findings.   Cord: Normal in signal and caliber.   Posterior Fossa, vertebral arteries, paraspinal  tissues: Visualized portions of the posterior fossa appear unremarkable. No significant paraspinal findings. Bilateral vertebral artery flow voids.   Disc levels:   C2-3: Normal interspace.   C3-4: Mild bilateral uncinate spurring and left-sided facet hypertrophy. Mild foraminal narrowing bilaterally. No cord deformity.   C4-5: Mild bilateral uncinate spurring and mild right foraminal narrowing. No cord deformity.   C5-6: Medial right foraminal disc osteophyte complex causes at least moderate right foraminal narrowing and probable right C6 nerve root encroachment. The left foramen is patent. No cord deformity.   C6-7: Spondylosis with disc bulging, posterior osteophytes and mild uncinate spurring bilaterally. No cord deformity or significant foraminal compromise.   C7-T1: Normal interspace.   IMPRESSION: 1. Medial right foraminal disc osteophyte complex at C5-6 causes at least moderate right foraminal narrowing and probable right C6 nerve root encroachment. 2. Spondylosis at C6-7 without spinal stenosis or nerve root encroachment. 3. Mild foraminal narrowing bilaterally at C3-4 and on the right at C4-5. 4. No cord deformity or abnormal cord signal.     Electronically Signed   By: Richardean Sale M.D.   On: 02/15/2020 13:16  Imaging: No results found.   PMFS History: Patient Active Problem List   Diagnosis Date Noted   Arthrosis of right acromioclavicular joint    PAD (peripheral artery disease) ([place]) 07/13/2020   S/P right rotator cuff repair 10/04/2018   Complete tear of right rotator cuff    Chronic pain of left knee    Left shoulder pain 09/21/2015   Disorders of bursae and tendons in shoulder region, unspecified 09/25/2013   Past Medical History:  Diagnosis Date   Anxiety    panic attacks   Arthritis    Chronic back pain    Chronic bronchitis (HCC)    Depression    Diabetes mellitus    GERD (gastroesophageal reflux disease)    prn tums   High  cholesterol    History of kidney stones    passed ?   Hypertension    no longer on BP medications   Migraine    Neuropathic pain of foot    Plantar fasciitis    Pneumonia 2011ish   Sciatica    Torn rotator cuff     Family History  Problem Relation Age of Onset   Heart failure Mother    COPD Mother    Diabetes Mother    Heart failure Father    Diabetes Other     Past Surgical History:  Procedure Laterality Date   ABDOMINAL AORTOGRAM W/LOWER EXTREMITY N/A 07/01/2020   Procedure: ABDOMINAL AORTOGRAM W/LOWER EXTREMITY;  Surgeon: Marty Heck, MD;  Location: [facility];  Service: Cardiovascular;  Laterality: N/A;   AMPUTATION Left 07/22/2020   Procedure: LEFT GREAT TOE AMPUTATION;  Surgeon: Waynetta Sandy, MD;  Location: [facility];  Service: Vascular;  Laterality: Left;   BACK SURGERY     herniated disc; had disc removed and then fusion; total of 3 surgeries   EYE SURGERY Bilateral    removal of cyst and straightening of muscles   KNEE ARTHROSCOPY WITH MEDIAL MENISECTOMY Left 12/27/2016   Procedure: LEFT KNEE DIAGNOSTIC ARTHROSCOPY;  Surgeon: Carole Civil, MD;  Location: [facility];  Service: Orthopedics;  Laterality: Left;   PERIPHERAL VASCULAR INTERVENTION Left 07/01/2020   Procedure: PERIPHERAL VASCULAR INTERVENTION;  Surgeon: Marty Heck, MD;  Location: [facility];  Service: Cardiovascular;  Laterality: Left;  common iliac   SHOULDER ARTHROSCOPY WITH DISTAL CLAVICLE RESECTION Right 05/13/2021   Procedure: ARTHROSCOPY RIGHT SHOULDER WITH DISTAL CLAVICLE EXCISION;  Surgeon: Carole Civil, MD;  Location: [facility];  Service: Orthopedics;  Laterality: Right;   SHOULDER OPEN ROTATOR CUFF REPAIR Right 09/19/2018   Procedure: ROTATOR CUFF REPAIR SHOULDER OPEN;  Surgeon: Carole Civil, MD;  Location: [facility];  Service: Orthopedics;  Laterality: Right;   SHOULDER OPEN ROTATOR CUFF REPAIR Right 08/05/2019   Procedure: ROTATOR CUFF REPAIR  SHOULDER OPEN;  Surgeon: Carole Civil, MD;  Location: [facility];  Service: Orthopedics;  Laterality: Right;   SHOULDER OPEN ROTATOR CUFF REPAIR Right 05/13/2021   Procedure: OPEN ROTATOR CUFF REPAIR RIGHT SHOULDER;  Surgeon: Carole Civil, MD;  Location: [facility];  Service: Orthopedics;  Laterality: Right;   TUBAL LIGATION     Social History   Occupational History   Not on file  Tobacco Use   Smoking status: Every Day    Packs/day: 1.00    Years: 31.00    Total pack years: 31.00    Types: Cigarettes   Smokeless tobacco: Never  Vaping Use   Vaping Use: Never used  Substance and Sexual Activity   Alcohol use: No   Drug use: No   Sexual activity: Yes    Birth control/protection: Surgical

## 2022-09-28 NOTE — H&P (Addendum)
History and Physical    Patient: Tina Pham [id number] DOB: May 20, 1976 DOA: 09/28/2022 DOS: the patient was seen and examined on 09/29/2022 PCP: Leonie Douglas, MD  Patient coming from: Home  Chief Complaint:  Chief Complaint  Patient presents with   Foot Pain   HPI: Tina Pham is a 47 y.o. female with medical history significant of GERD, hypertension, T2DM, peripheral neuropathy who presents to the emergency department due to pain in the right toe. Patient was seen in the ED on 3/4 due to 1 week history of flulike symptoms during which she was noted to have a wound on her right foot, she was diagnosed to have cellulitis and was prescribed with doxycycline, she was also asked to return to the ED for worsening of symptoms from the toe.  She returned this evening due to worsening pain from the right toe.  She denies nausea, vomiting, chest pain, shortness of breath.  ED Course:  In the emergency department, she was tachycardic with HR of 110 bpm and BP was 170/92, other vital signs were within normal range.  Workup in the ED showed normocytic anemia and normal BMP except for hyperglycemia (CBG 271). Right great toe x-ray showed no acute fracture or dislocation and showed soft tissue swelling of the medial aspect of the great toe. X-ray of cervical spine showed mid cervical spondylosis.  Bilateral carotid calcification.  She was treated with IV vancomycin, oxycodone 5 mg orally x 1 was given. Hospitalist was asked to admit patient for further evaluation and management.  Review of Systems: Review of systems as noted in the HPI. All other systems reviewed and are negative.   Past Medical History:  Diagnosis Date   Anxiety    panic attacks   Arthritis    Chronic back pain    Chronic bronchitis (HCC)    Depression    Diabetes mellitus    GERD (gastroesophageal reflux disease)    prn tums   High cholesterol    History of kidney stones    passed ?   Hypertension     no longer on BP medications   Migraine    Neuropathic pain of foot    Plantar fasciitis    Pneumonia 2011ish   Sciatica    Torn rotator cuff    Past Surgical History:  Procedure Laterality Date   ABDOMINAL AORTOGRAM W/LOWER EXTREMITY N/A 07/01/2020   Procedure: ABDOMINAL AORTOGRAM W/LOWER EXTREMITY;  Surgeon: Marty Heck, MD;  Location: [facility];  Service: Cardiovascular;  Laterality: N/A;   AMPUTATION Left 07/22/2020   Procedure: LEFT GREAT TOE AMPUTATION;  Surgeon: Waynetta Sandy, MD;  Location: [facility];  Service: Vascular;  Laterality: Left;   BACK SURGERY     herniated disc; had disc removed and then fusion; total of 3 surgeries   EYE SURGERY Bilateral    removal of cyst and straightening of muscles   KNEE ARTHROSCOPY WITH MEDIAL MENISECTOMY Left 12/27/2016   Procedure: LEFT KNEE DIAGNOSTIC ARTHROSCOPY;  Surgeon: Carole Civil, MD;  Location: [facility];  Service: Orthopedics;  Laterality: Left;   PERIPHERAL VASCULAR INTERVENTION Left 07/01/2020   Procedure: PERIPHERAL VASCULAR INTERVENTION;  Surgeon: Marty Heck, MD;  Location: [facility];  Service: Cardiovascular;  Laterality: Left;  common iliac   SHOULDER ARTHROSCOPY WITH DISTAL CLAVICLE RESECTION Right 05/13/2021   Procedure: ARTHROSCOPY RIGHT SHOULDER WITH DISTAL CLAVICLE EXCISION;  Surgeon: Carole Civil, MD;  Location: [facility];  Service: Orthopedics;  Laterality: Right;  SHOULDER OPEN ROTATOR CUFF REPAIR Right 09/19/2018   Procedure: ROTATOR CUFF REPAIR SHOULDER OPEN;  Surgeon: Carole Civil, MD;  Location: [facility];  Service: Orthopedics;  Laterality: Right;   SHOULDER OPEN ROTATOR CUFF REPAIR Right 08/05/2019   Procedure: ROTATOR CUFF REPAIR SHOULDER OPEN;  Surgeon: Carole Civil, MD;  Location: [facility];  Service: Orthopedics;  Laterality: Right;   SHOULDER OPEN ROTATOR CUFF REPAIR Right 05/13/2021   Procedure: OPEN ROTATOR CUFF REPAIR RIGHT SHOULDER;  Surgeon:  Carole Civil, MD;  Location: [facility];  Service: Orthopedics;  Laterality: Right;   TUBAL LIGATION      Social History:  reports that she has been smoking cigarettes. She has a 31.00 pack-year smoking history. She has never used smokeless tobacco. She reports that she does not drink alcohol and does not use drugs.   Allergies  Allergen Reactions   Codeine Other (See Comments)    Upset Stomach   Metformin Other (See Comments)    Will not take per MD advisement    Benadryl [Diphenhydramine Hcl] Palpitations   Compazine Palpitations    Family History  Problem Relation Age of Onset   Heart failure Mother    COPD Mother    Diabetes Mother    Heart failure Father    Diabetes Other      Prior to Admission medications   Medication Sig Start Date End Date Taking? Authorizing Provider  Accu-Chek Softclix Lancets lancets SMARTSIG:Topical 04/27/21   [provider]  aspirin EC 81 MG tablet Take 81 mg by mouth daily. Swallow whole.    [provider]  atorvastatin (LIPITOR) 10 MG tablet TAKE 1 TABLET(10 MG) BY MOUTH DAILY 09/26/21   Waynetta Sandy, MD  cefadroxil (DURICEF) 500 MG capsule Take 1 capsule (500 mg total) by mouth 2 (two) times daily. Patient not taking: Reported on 07/10/2022 03/16/22   Prosperi, Christian H, PA-C  doxycycline (VIBRAMYCIN) 100 MG capsule Take 1 capsule (100 mg total) by mouth 2 (two) times daily. 09/25/22   Evalee Jefferson, PA-C  esomeprazole (NEXIUM) 20 MG capsule Take 20 mg by mouth daily as needed (acid reflux).    [provider]  FEROSUL 325 (65 Fe) MG tablet Take 325 mg by mouth 2 (two) times daily. 04/27/21   [provider]  gabapentin (NEURONTIN) 300 MG capsule TAKE 1 CAPSULE(300 MG) BY MOUTH THREE TIMES DAILY 05/10/22   Carole Civil, MD  glipiZIDE (GLUCOTROL) 10 MG tablet Take 10 mg by mouth in the morning. 04/13/21   [provider]  HYDROcodone-acetaminophen (NORCO/VICODIN) 5-325 MG tablet Take  1 tablet by mouth every 6 (six) hours as needed for moderate pain. 09/25/22   Carole Civil, MD  tiZANidine (ZANAFLEX) 4 MG tablet Take 1 tablet (4 mg total) by mouth every 6 (six) hours as needed for muscle spasms. 09/13/22   Carole Civil, MD  metFORMIN (GLUCOPHAGE) 500 MG tablet Take 1 tablet (500 mg total) by mouth 2 (two) times daily with a meal. 06/15/20 06/15/20  Fransico Meadow, PA-C    Physical Exam: BP (!) 176/84   Pulse 79   Temp 97.6 F (36.4 C) (Oral)   Resp 19   Ht 5\' 8"  (1.727 m)   Wt 95.8 kg   SpO2 100%   BMI 32.11 kg/m   General: 47 y.o. year-old female well developed well nourished in no acute distress.  Alert and oriented x3. HEENT: NCAT, EOMI Neck: Supple, trachea medial Cardiovascular: Regular rate and rhythm  with no rubs or gallops.  No thyromegaly or JVD noted.  No lower extremity edema. 2/4 pulses in all 4 extremities. Respiratory: Clear to auscultation with no wheezes or rales. Good inspiratory effort. Abdomen: Soft, nontender nondistended with normal bowel sounds x4 quadrants. Muskuloskeletal: Debrided wound with purulence of right great toe.  Left great toe amputation Neuro: CN II-XII intact, strength 5/5 x 4, sensation, reflexes intact Skin: No ulcerative lesions noted or rashes Psychiatry: Judgement and insight appear normal. Mood is appropriate for condition and setting                Labs on Admission     Labs on Admission:  Basic Metabolic Panel: Recent Labs  Lab 09/28/22 2145  NA 136  K 3.6  CL 102  CO2 24  GLUCOSE 271*  BUN 15  CREATININE 0.55  CALCIUM 8.7*   Liver Function Tests: Recent Labs  Lab 09/28/22 2145  AST 17  ALT 11  ALKPHOS 59  BILITOT 0.5  PROT 7.2  ALBUMIN 3.8   No results for input(s): "LIPASE", "AMYLASE" in the last 168 hours. No results for input(s): "AMMONIA" in the last 168 hours. CBC: Recent Labs  Lab 09/28/22 2145  WBC 7.3  NEUTROABS 4.1  HGB 10.1*  HCT 33.5*  MCV 80.3  PLT 359    Cardiac Enzymes: No results for input(s): "CKTOTAL", "CKMB", "CKMBINDEX", "TROPONINI" in the last 168 hours.  BNP (last 3 results) No results for input(s): "BNP" in the last 8760 hours.  ProBNP (last 3 results) No results for input(s): "PROBNP" in the last 8760 hours.  CBG: Recent Labs  Lab 09/25/22 2157  GLUCAP 298*    Radiological Exams on Admission: DG Toe Great Right  Result Date: 09/28/2022 CLINICAL DATA:  Sore on the great toe. EXAM: RIGHT GREAT TOE COMPARISON:  None Available. FINDINGS: There is no acute fracture or dislocation. The bones are well mineralized. No arthritic changes. No bone erosion or periosteal elevation. Soft tissue swelling of the medial aspect of the great toe. No radiopaque foreign object or soft tissue gas. IMPRESSION: 1. No acute fracture or dislocation. 2. Soft tissue swelling of the medial aspect of the great toe. Electronically Signed   By: Anner Crete M.D.   On: 09/28/2022 21:41   XR Cervical Spine 2 or 3 views  Result Date: 09/28/2022 [facility] lateral cervical spine images are obtained and reviewed.  Lateral chronic calcifications noted.  Spondylosis changes worse at C6-7 with anterior posterior spur some disc space narrowing and less prominent posterior osteophytes at C5-6. Impression: Mid cervical spondylosis.  Bilateral carotid calcification.   EKG: I independently viewed the EKG done and my findings are as followed: EKG was not done in the ED  Assessment/Plan Present on Admission:  Cellulitis of great toe of right foot  Principal Problem:   Cellulitis of great toe of right foot Active Problems:   Open wound of right great toe   Uncontrolled diabetes mellitus with hyperglycemia (HCC)   GERD (gastroesophageal reflux disease)   Essential hypertension   Mixed hyperlipidemia   Obesity (BMI 30-39.9)   Peripheral neuropathy  Cellulitis and wound of right great toe status post outpatient antibiotic treatment failure Right great toe x-ray  showed no acute fracture or dislocation.  Soft tissue swelling to the medial aspect of the great toe Continue IV vancomycin Continue wound care Continue Norco as needed  Uncontrolled hyperglycemia secondary to type 2 diabetes mellitus Continue Semglee 10 units and adjust dose accordingly Continue ISS and  hypoglycemia protocol  GERD Continue Protonix  Essential hypertension No BP meds noted in patient's med rec Continue IV hydralazine 10 mg every 6 hours as needed for SBP > 170  Mixed hyperlipidemia Continue Lipitor  Peripheral neuropathy Continue gabapentin  Obesity (BMI 32.11) Continue diet and lifestyle modification  DVT prophylaxis: Lovenox  Family Communication: Family at bedside   Advance Care Planning: CODE STATUS: Full code  Consults: None  Severity of Illness: The appropriate patient status for this patient is INPATIENT. Inpatient status is judged to be reasonable and necessary in order to provide the required intensity of service to ensure the patient's safety. The patient's presenting symptoms, physical exam findings, and initial radiographic and laboratory data in the context of their chronic comorbidities is felt to place them at high risk for further clinical deterioration. Furthermore, it is not anticipated that the patient will be medically stable for discharge from the hospital within 2 midnights of admission.   * I certify that at the point of admission it is my clinical judgment that the patient will require inpatient hospital care spanning beyond 2 midnights from the point of admission due to high intensity of service, high risk for further deterioration and high frequency of surveillance required.*  Author: Bernadette Hoit, DO 09/29/2022 3:05 AM  For on call review [email or web address].

## 2022-09-28 NOTE — ED Triage Notes (Signed)
States she was here a few days ago for cellulitis on right foot.  She was given antibiotics & was told to come back if she wasn't getting worse.  States her foot is in more pain & appears to be getting worse.  Also c/o right forearm & hand pain.

## 2022-09-28 NOTE — ED Provider Notes (Signed)
[place] Provider Note   CSN: [id number] Arrival date & time: 09/28/22  1814     History  Chief Complaint  Patient presents with   Foot Pain    Tina Pham is a 47 y.o. female.  Patient has history of diabetes.  She developed cellulitis to the right great toe and has been on doxycycline for 4 days.  Patient states she is getting worse.  The history is provided by the patient and medical records. No language interpreter was used.  Foot Pain This is a recurrent problem. The current episode started more than 2 days ago. The problem occurs constantly. The problem has not changed since onset.Pertinent negatives include no chest pain, no abdominal pain and no headaches. Nothing aggravates the symptoms. Nothing relieves the symptoms. She has tried nothing for the symptoms.       Home Medications Prior to Admission medications   Medication Sig Start Date End Date Taking? Authorizing Provider  Accu-Chek Softclix Lancets lancets SMARTSIG:Topical 04/27/21   [provider]  aspirin EC 81 MG tablet Take 81 mg by mouth daily. Swallow whole.    [provider]  atorvastatin (LIPITOR) 10 MG tablet TAKE 1 TABLET(10 MG) BY MOUTH DAILY 09/26/21   Waynetta Sandy, MD  cefadroxil (DURICEF) 500 MG capsule Take 1 capsule (500 mg total) by mouth 2 (two) times daily. Patient not taking: Reported on 07/10/2022 03/16/22   Prosperi, Christian H, PA-C  doxycycline (VIBRAMYCIN) 100 MG capsule Take 1 capsule (100 mg total) by mouth 2 (two) times daily. 09/25/22   Evalee Jefferson, PA-C  esomeprazole (NEXIUM) 20 MG capsule Take 20 mg by mouth daily as needed (acid reflux).    [provider]  FEROSUL 325 (65 Fe) MG tablet Take 325 mg by mouth 2 (two) times daily. 04/27/21   [provider]  gabapentin (NEURONTIN) 300 MG capsule TAKE 1 CAPSULE(300 MG) BY MOUTH THREE TIMES DAILY 05/10/22   Carole Civil, MD  glipiZIDE  (GLUCOTROL) 10 MG tablet Take 10 mg by mouth in the morning. 04/13/21   [provider]  HYDROcodone-acetaminophen (NORCO/VICODIN) 5-325 MG tablet Take 1 tablet by mouth every 6 (six) hours as needed for moderate pain. 09/25/22   Carole Civil, MD  tiZANidine (ZANAFLEX) 4 MG tablet Take 1 tablet (4 mg total) by mouth every 6 (six) hours as needed for muscle spasms. 09/13/22   Carole Civil, MD  metFORMIN (GLUCOPHAGE) 500 MG tablet Take 1 tablet (500 mg total) by mouth 2 (two) times daily with a meal. 06/15/20 06/15/20  Fransico Meadow, PA-C      Allergies    Codeine, Metformin, Benadryl [diphenhydramine hcl], and Compazine    Review of Systems   Review of Systems  Constitutional:  Negative for appetite change and fatigue.  HENT:  Negative for congestion, ear discharge and sinus pressure.   Eyes:  Negative for discharge.  Respiratory:  Negative for cough.   Cardiovascular:  Negative for chest pain.  Gastrointestinal:  Negative for abdominal pain and diarrhea.  Genitourinary:  Negative for frequency and hematuria.  Musculoskeletal:  Negative for back pain.       Pain in right great toe  Skin:  Negative for rash.  Neurological:  Negative for seizures and headaches.  Psychiatric/Behavioral:  Negative for hallucinations.     Physical Exam Updated Vital Signs BP 139/85   Pulse 69   Temp 98.6 F (37 C) (Oral)   Resp 17  Ht 5\' 8"  (1.727 m)   Wt 99.8 kg   SpO2 95%   BMI 33.45 kg/m  Physical Exam Vitals and nursing note reviewed.  Constitutional:      Appearance: She is well-developed and normal weight.  HENT:     Head: Normocephalic.     Nose: Nose normal.  Eyes:     General: No scleral icterus.    Conjunctiva/sclera: Conjunctivae normal.  Neck:     Thyroid: No thyromegaly.  Cardiovascular:     Rate and Rhythm: Normal rate and regular rhythm.     Heart sounds: No murmur heard.    No friction rub. No gallop.  Pulmonary:     Breath sounds: No stridor. No  wheezing or rales.  Chest:     Chest wall: No tenderness.  Abdominal:     General: There is no distension.     Tenderness: There is no abdominal tenderness. There is no rebound.  Musculoskeletal:        General: Normal range of motion.     Cervical back: Neck supple.     Comments: Swollen tender right great toe.  Appears to be cellulitis  Lymphadenopathy:     Cervical: No cervical adenopathy.  Skin:    Findings: No erythema or rash.  Neurological:     Mental Status: She is oriented to person, place, and time.     Motor: No abnormal muscle tone.     Coordination: Coordination normal.  Psychiatric:        Behavior: Behavior normal.     ED Results / Procedures / Treatments   Labs (all labs ordered are listed, but only abnormal results are displayed) Labs Reviewed  CBC WITH DIFFERENTIAL/PLATELET - Abnormal; Notable for the following components:      Result Value   Hemoglobin 10.1 (*)    HCT 33.5 (*)    MCH 24.2 (*)    All other components within normal limits  COMPREHENSIVE METABOLIC PANEL - Abnormal; Notable for the following components:   Glucose, Bld 271 (*)    Calcium 8.7 (*)    All other components within normal limits    EKG None  Radiology DG Toe Great Right  Result Date: 09/28/2022 CLINICAL DATA:  Sore on the great toe. EXAM: RIGHT GREAT TOE COMPARISON:  None Available. FINDINGS: There is no acute fracture or dislocation. The bones are well mineralized. No arthritic changes. No bone erosion or periosteal elevation. Soft tissue swelling of the medial aspect of the great toe. No radiopaque foreign object or soft tissue gas. IMPRESSION: 1. No acute fracture or dislocation. 2. Soft tissue swelling of the medial aspect of the great toe. Electronically Signed   By: Anner Crete M.D.   On: 09/28/2022 21:41   XR Cervical Spine 2 or 3 views  Result Date: 09/28/2022 AP lateral cervical spine images are obtained and reviewed.  Lateral chronic calcifications noted.   Spondylosis changes worse at C6-7 with anterior posterior spur some disc space narrowing and less prominent posterior osteophytes at C5-6. Impression: Mid cervical spondylosis.  Bilateral carotid calcification.   Procedures Procedures    Medications Ordered in ED Medications  vancomycin (VANCOCIN) IVPB 1000 mg/200 mL premix (0 mg Intravenous Stopped 09/28/22 2250)    ED Course/ Medical Decision Making/ A&P                             Medical Decision Making Amount and/or Complexity of Data Reviewed Labs:  ordered. Radiology: ordered.  Risk Prescription drug management. Decision regarding hospitalization.  This patient presents to the ED for concern of infected right great toe, this involves an extensive number of treatment options, and is a complaint that carries with it a high risk of complications and morbidity.  The differential diagnosis includes osteomyelitis of right great toe or cellulitis   Co morbidities that complicate the patient evaluation  Diabetes   Additional history obtained:  Additional history obtained from patient External records from outside source obtained and reviewed including hospital records   Lab Tests:  I Ordered, and personally interpreted labs.  The pertinent results include: Hemoglobin 10 and white count 7.3   Imaging Studies ordered:  I ordered imaging studies including x-ray of the right great toe I independently visualized and interpreted imaging which showed no osteomyelitis I agree with the radiologist interpretation   Cardiac Monitoring: / EKG:  The patient was maintained on a cardiac monitor.  I personally viewed and interpreted the cardiac monitored which showed an underlying rhythm of: Normal sinus rhythm   Consultations Obtained:  I requested consultation with the hospitalist,  and discussed lab and imaging findings as well as pertinent plan - they recommend: Admit for antibiotics   Problem List / ED Course / Critical  interventions / Medication management  Diabetes and cellulitis to right great toe I ordered medication including vancomycin for cellulitis Reevaluation of the patient after these medicines showed that the patient stayed the same I have reviewed the patients home medicines and have made adjustments as needed   Social Determinants of Health:  None   Test / Admission - Considered:  none  Pt will be admitted for cellulitis of the right great toe that has failed outpatient treatment with doxycycline        Final Clinical Impression(s) / ED Diagnoses Final diagnoses:  Cellulitis of great toe of right foot    Rx / DC Orders ED Discharge Orders     None         Milton Ferguson, MD 09/29/22 1313

## 2022-09-29 ENCOUNTER — Inpatient Hospital Stay (HOSPITAL_COMMUNITY): Payer: Self-pay

## 2022-09-29 DIAGNOSIS — K219 Gastro-esophageal reflux disease without esophagitis: Secondary | ICD-10-CM | POA: Insufficient documentation

## 2022-09-29 DIAGNOSIS — E782 Mixed hyperlipidemia: Secondary | ICD-10-CM

## 2022-09-29 DIAGNOSIS — S91101A Unspecified open wound of right great toe without damage to nail, initial encounter: Secondary | ICD-10-CM | POA: Insufficient documentation

## 2022-09-29 DIAGNOSIS — E669 Obesity, unspecified: Secondary | ICD-10-CM | POA: Insufficient documentation

## 2022-09-29 DIAGNOSIS — I10 Essential (primary) hypertension: Secondary | ICD-10-CM

## 2022-09-29 DIAGNOSIS — E1165 Type 2 diabetes mellitus with hyperglycemia: Secondary | ICD-10-CM | POA: Insufficient documentation

## 2022-09-29 DIAGNOSIS — G629 Polyneuropathy, unspecified: Secondary | ICD-10-CM

## 2022-09-29 LAB — CBC
HCT: 35 % — ABNORMAL LOW (ref 36.0–46.0)
Hemoglobin: 10.3 g/dL — ABNORMAL LOW (ref 12.0–15.0)
MCH: 23.7 pg — ABNORMAL LOW (ref 26.0–34.0)
MCHC: 29.4 g/dL — ABNORMAL LOW (ref 30.0–36.0)
MCV: 80.6 fL (ref 80.0–100.0)
Platelets: 399 10*3/uL (ref 150–400)
RBC: 4.34 MIL/uL (ref 3.87–5.11)
RDW: 14.7 % (ref 11.5–15.5)
WBC: 7.1 10*3/uL (ref 4.0–10.5)
nRBC: 0 % (ref 0.0–0.2)

## 2022-09-29 LAB — GLUCOSE, CAPILLARY
Glucose-Capillary: 241 mg/dL — ABNORMAL HIGH (ref 70–99)
Glucose-Capillary: 280 mg/dL — ABNORMAL HIGH (ref 70–99)
Glucose-Capillary: 214 mg/dL — ABNORMAL HIGH (ref 70–99)

## 2022-09-29 LAB — PHOSPHORUS: Phosphorus: 2.6 mg/dL (ref 2.5–4.6)

## 2022-09-29 LAB — COMPREHENSIVE METABOLIC PANEL
ALT: 10 U/L (ref 0–44)
AST: 16 U/L (ref 15–41)
Albumin: 3.6 g/dL (ref 3.5–5.0)
Alkaline Phosphatase: 53 U/L (ref 38–126)
Anion gap: 9 (ref 5–15)
BUN: 12 mg/dL (ref 6–20)
CO2: 23 mmol/L (ref 22–32)
Calcium: 8.8 mg/dL — ABNORMAL LOW (ref 8.9–10.3)
Chloride: 105 mmol/L (ref 98–111)
Creatinine, Ser: 0.5 mg/dL (ref 0.44–1.00)
GFR, Estimated: >60 mL/min (ref >60)
Glucose, Bld: 206 mg/dL — ABNORMAL HIGH (ref 70–99)
Potassium: 3.8 mmol/L (ref 3.5–5.1)
Sodium: 137 mmol/L (ref 135–145)
Total Bilirubin: 0.4 mg/dL (ref 0.3–1.2)
Total Protein: 6.8 g/dL (ref 6.5–8.1)

## 2022-09-29 LAB — HIV ANTIBODY (ROUTINE TESTING W REFLEX): HIV Screen 4th Generation wRfx: NONREACTIVE

## 2022-09-29 LAB — MAGNESIUM: Magnesium: 2 mg/dL (ref 1.7–2.4)

## 2022-09-29 MED ORDER — INSULIN ASPART 100 UNIT/ML IJ SOLN: 0.0000 [IU] | Freq: Every day | INTRAMUSCULAR | Status: DC

## 2022-09-29 MED ORDER — ACETAMINOPHEN 650 MG RE SUPP: 650.0000 mg | Freq: Four times a day (QID) | RECTAL | Status: DC | PRN

## 2022-09-29 MED ORDER — ENOXAPARIN SODIUM 40 MG/0.4ML IJ SOSY
40.0000 mg | PREFILLED_SYRINGE | INTRAMUSCULAR | Status: DC
  Administered 2022-09-29: 40 mg via SUBCUTANEOUS
  Filled 2022-09-29: qty 0.4

## 2022-09-29 MED ORDER — DOXYCYCLINE HYCLATE 100 MG PO CAPS: 100.0000 mg | ORAL_CAPSULE | Freq: Two times a day (BID) | ORAL | 0 refills | Status: DC

## 2022-09-29 MED ORDER — PANTOPRAZOLE SODIUM 40 MG PO TBEC
40.0000 mg | DELAYED_RELEASE_TABLET | Freq: Every day | ORAL | Status: DC
  Administered 2022-09-29: 40 mg via ORAL
  Filled 2022-09-29: qty 1

## 2022-09-29 MED ORDER — OXYCODONE HCL 5 MG PO TABS
5.0000 mg | ORAL_TABLET | Freq: Once | ORAL | Status: AC
  Administered 2022-09-29: 5 mg via ORAL
  Filled 2022-09-29: qty 1

## 2022-09-29 MED ORDER — ONDANSETRON HCL 4 MG/2ML IJ SOLN: 4.0000 mg | Freq: Four times a day (QID) | INTRAMUSCULAR | Status: DC | PRN

## 2022-09-29 MED ORDER — GABAPENTIN 300 MG PO CAPS
300.0000 mg | ORAL_CAPSULE | Freq: Two times a day (BID) | ORAL | Status: DC
  Administered 2022-09-29: 300 mg via ORAL
  Filled 2022-09-29: qty 1

## 2022-09-29 MED ORDER — HYDRALAZINE HCL 20 MG/ML IJ SOLN: 10.0000 mg | Freq: Four times a day (QID) | INTRAMUSCULAR | Status: DC | PRN

## 2022-09-29 MED ORDER — HYDROCODONE-ACETAMINOPHEN 5-325 MG PO TABS
1.0000 | ORAL_TABLET | Freq: Four times a day (QID) | ORAL | Status: DC | PRN
  Administered 2022-09-29 (×2): 1 via ORAL
  Filled 2022-09-29 (×2): qty 1

## 2022-09-29 MED ORDER — GLIPIZIDE 5 MG PO TABS: 10.0000 mg | ORAL_TABLET | Freq: Every morning | ORAL | Status: DC

## 2022-09-29 MED ORDER — INSULIN GLARGINE-YFGN 100 UNIT/ML ~~LOC~~ SOLN
10.0000 [IU] | Freq: Every day | SUBCUTANEOUS | Status: DC
  Filled 2022-09-29: qty 0.1

## 2022-09-29 MED ORDER — ONDANSETRON HCL 4 MG PO TABS: 4.0000 mg | ORAL_TABLET | Freq: Four times a day (QID) | ORAL | Status: DC | PRN

## 2022-09-29 MED ORDER — ACETAMINOPHEN 325 MG PO TABS: 650.0000 mg | ORAL_TABLET | Freq: Four times a day (QID) | ORAL | Status: DC | PRN

## 2022-09-29 MED ORDER — VANCOMYCIN HCL 1250 MG/250ML IV SOLN
1250.0000 mg | Freq: Two times a day (BID) | INTRAVENOUS | Status: DC
  Administered 2022-09-29: 1250 mg via INTRAVENOUS
  Filled 2022-09-29: qty 250

## 2022-09-29 MED ORDER — VANCOMYCIN HCL IN DEXTROSE 1-5 GM/200ML-% IV SOLN: 1000.0000 mg | Freq: Once | INTRAVENOUS | Status: DC

## 2022-09-29 MED ORDER — INSULIN ASPART 100 UNIT/ML IJ SOLN
0.0000 [IU] | Freq: Three times a day (TID) | INTRAMUSCULAR | Status: DC
  Administered 2022-09-29: 8 [IU] via SUBCUTANEOUS
  Administered 2022-09-29: 5 [IU] via SUBCUTANEOUS

## 2022-09-29 MED ORDER — ATORVASTATIN CALCIUM 10 MG PO TABS
10.0000 mg | ORAL_TABLET | Freq: Every day | ORAL | Status: DC
  Administered 2022-09-29: 10 mg via ORAL
  Filled 2022-09-29: qty 1

## 2022-09-29 NOTE — ED Notes (Signed)
ED TO INPATIENT HANDOFF REPORT  ED Nurse Name and Phone #: Idelia Salm, RN [phone]  S Name/Age/Gender Tina Pham 47 y.o. female Room/Bed: APA08/APA08  Code Status   Code Status: Prior  Home/SNF/Other Home  Is this baseline?   Triage Complete: Triage complete  Chief Complaint Cellulitis of toe of right foot [id number]  Triage Note States she was here a few days ago for cellulitis on right foot.  She was given antibiotics & was told to come back if she wasn't getting worse.  States her foot is in more pain & appears to be getting worse.  Also c/o right forearm & hand pain.   Allergies Allergies  Allergen Reactions   Codeine Other (See Comments)    Upset Stomach   Metformin Other (See Comments)    Will not take per MD advisement    Benadryl [Diphenhydramine Hcl] Palpitations   Compazine Palpitations    Level of Care/Admitting Diagnosis ED Disposition     ED Disposition  Admit   Condition  --   [place]: [facility] [id number] Level of Care: Med-Surg [16] Covid Evaluation: Asymptomatic - no recent exposure (last 10 days) testing not required Diagnosis: Cellulitis of toe of right foot [id number] Admitting Physician: Mallie Mussel [id number] Attending Physician: Bernadette Hoit [id number] Certification:: I certify this patient will need inpatient services for at least 2 midnights Estimated Length of Stay: 3          B Medical/Surgery History Past Medical History:  Diagnosis Date   Anxiety    panic attacks   Arthritis    Chronic back pain    Chronic bronchitis (HCC)    Depression    Diabetes mellitus    GERD (gastroesophageal reflux disease)    prn tums   High cholesterol    History of kidney stones    passed ?   Hypertension    no longer on BP medications   Migraine    Neuropathic pain of foot    Plantar fasciitis    Pneumonia 2011ish   Sciatica    Torn rotator cuff    Past Surgical History:  Procedure  Laterality Date   ABDOMINAL AORTOGRAM W/LOWER EXTREMITY N/A 07/01/2020   Procedure: ABDOMINAL AORTOGRAM W/LOWER EXTREMITY;  Surgeon: Marty Heck, MD;  Location: [facility];  Service: Cardiovascular;  Laterality: N/A;   AMPUTATION Left 07/22/2020   Procedure: LEFT GREAT TOE AMPUTATION;  Surgeon: Waynetta Sandy, MD;  Location: [facility];  Service: Vascular;  Laterality: Left;   BACK SURGERY     herniated disc; had disc removed and then fusion; total of 3 surgeries   EYE SURGERY Bilateral    removal of cyst and straightening of muscles   KNEE ARTHROSCOPY WITH MEDIAL MENISECTOMY Left 12/27/2016   Procedure: LEFT KNEE DIAGNOSTIC ARTHROSCOPY;  Surgeon: Carole Civil, MD;  Location: [facility];  Service: Orthopedics;  Laterality: Left;   PERIPHERAL VASCULAR INTERVENTION Left 07/01/2020   Procedure: PERIPHERAL VASCULAR INTERVENTION;  Surgeon: Marty Heck, MD;  Location: [facility];  Service: Cardiovascular;  Laterality: Left;  common iliac   SHOULDER ARTHROSCOPY WITH DISTAL CLAVICLE RESECTION Right 05/13/2021   Procedure: ARTHROSCOPY RIGHT SHOULDER WITH DISTAL CLAVICLE EXCISION;  Surgeon: Carole Civil, MD;  Location: [facility];  Service: Orthopedics;  Laterality: Right;   SHOULDER OPEN ROTATOR CUFF REPAIR Right 09/19/2018   Procedure: ROTATOR CUFF REPAIR SHOULDER OPEN;  Surgeon: Carole Civil, MD;  Location: [facility];  Service:  Orthopedics;  Laterality: Right;   SHOULDER OPEN ROTATOR CUFF REPAIR Right 08/05/2019   Procedure: ROTATOR CUFF REPAIR SHOULDER OPEN;  Surgeon: Carole Civil, MD;  Location: [facility];  Service: Orthopedics;  Laterality: Right;   SHOULDER OPEN ROTATOR CUFF REPAIR Right 05/13/2021   Procedure: OPEN ROTATOR CUFF REPAIR RIGHT SHOULDER;  Surgeon: Carole Civil, MD;  Location: [facility];  Service: Orthopedics;  Laterality: Right;   TUBAL LIGATION       A IV Location/Drains/Wounds Patient Lines/Drains/Airways Status     Active  Line/Drains/Airways     Name Placement date Placement time Site Days   Peripheral IV 09/28/22 20 G 1" Left Antecubital 09/28/22  2146  Antecubital  1            Intake/Output Last 24 hours  Intake/Output Summary (Last 24 hours) at 09/29/2022 0028 Last data filed at 09/28/2022 2250 Gross per 24 hour  Intake 188.24 ml  Output --  Net 188.24 ml    Labs/Imaging Results for orders placed or performed during the hospital encounter of 09/28/22 (from the past 48 hour(s))  CBC with Differential     Status: Abnormal   Collection Time: 09/28/22  9:45 PM  Result Value Ref Range   WBC 7.3 4.0 - 10.5 K/uL   RBC 4.17 3.87 - 5.11 MIL/uL   Hemoglobin 10.1 (L) 12.0 - 15.0 g/dL   HCT 33.5 (L) 36.0 - 46.0 %   MCV 80.3 80.0 - 100.0 fL   MCH 24.2 (L) 26.0 - 34.0 pg   MCHC 30.1 30.0 - 36.0 g/dL   RDW 15.0 11.5 - 15.5 %   Platelets 359 150 - 400 K/uL   nRBC 0.0 0.0 - 0.2 %   Neutrophils Relative % 56 %   Neutro Abs 4.1 1.7 - 7.7 K/uL   Lymphocytes Relative 33 %   Lymphs Abs 2.4 0.7 - 4.0 K/uL   Monocytes Relative 7 %   Monocytes Absolute 0.5 0.1 - 1.0 K/uL   Eosinophils Relative 3 %   Eosinophils Absolute 0.2 0.0 - 0.5 K/uL   Basophils Relative 1 %   Basophils Absolute 0.1 0.0 - 0.1 K/uL   Immature Granulocytes 0 %   Abs Immature Granulocytes 0.02 0.00 - 0.07 K/uL    Comment: Performed at [facility], [address] [place], [place] [address]  Comprehensive metabolic panel     Status: Abnormal   Collection Time: 09/28/22  9:45 PM  Result Value Ref Range   Sodium 136 135 - 145 mmol/L   Potassium 3.6 3.5 - 5.1 mmol/L   Chloride 102 98 - 111 mmol/L   CO2 24 22 - 32 mmol/L   Glucose, Bld 271 (H) 70 - 99 mg/dL    Comment: Glucose reference range applies only to samples taken after fasting for at least 8 hours.   BUN 15 6 - 20 mg/dL   Creatinine, Ser 0.55 0.44 - 1.00 mg/dL   Calcium 8.7 (L) 8.9 - 10.3 mg/dL   Total Protein 7.2 6.5 - 8.1 g/dL   Albumin 3.8 3.5 - 5.0 g/dL   AST 17 15 - 41  U/L   ALT 11 0 - 44 U/L   Alkaline Phosphatase 59 38 - 126 U/L   Total Bilirubin 0.5 0.3 - 1.2 mg/dL   GFR, Estimated >60 >60 mL/min    Comment: (NOTE) Calculated using the CKD-EPI Creatinine Equation (2021)    Anion gap 10 5 - 15    Comment: Performed at [facility],  [address], [place], [place] [address]   DG Toe Great Right  Result Date: 09/28/2022 CLINICAL DATA:  Sore on the great toe. EXAM: RIGHT GREAT TOE COMPARISON:  None Available. FINDINGS: There is no acute fracture or dislocation. The bones are well mineralized. No arthritic changes. No bone erosion or periosteal elevation. Soft tissue swelling of the medial aspect of the great toe. No radiopaque foreign object or soft tissue gas. IMPRESSION: 1. No acute fracture or dislocation. 2. Soft tissue swelling of the medial aspect of the great toe. Electronically Signed   By: Anner Crete M.D.   On: 09/28/2022 21:41   XR Cervical Spine 2 or 3 views  Result Date: 09/28/2022 AP lateral cervical spine images are obtained and reviewed.  Lateral chronic calcifications noted.  Spondylosis changes worse at C6-7 with anterior posterior spur some disc space narrowing and less prominent posterior osteophytes at C5-6. Impression: Mid cervical spondylosis.  Bilateral carotid calcification.   Pending Labs Unresulted Labs (From admission, onward)    None       Vitals/Pain Today's Vitals   09/28/22 2100 09/28/22 2130 09/28/22 2238 09/28/22 2238  BP: (!) 146/81 139/85    Pulse: 82 69    Resp: 17     Temp:   98.6 F (37 C)   TempSrc:   Oral   SpO2: 97% 95%    Weight:      Height:      PainSc:    7     Isolation Precautions No active isolations  Medications Medications  vancomycin (VANCOCIN) IVPB 1000 mg/200 mL premix (0 mg Intravenous Stopped 09/28/22 2250)    Mobility walks     Focused Assessments   skin   R Recommendations: See Admitting Provider Note  Report given to: Lovey Newcomer, RN  Additional Notes:

## 2022-09-29 NOTE — Progress Notes (Signed)
  Transition of Care [facility]) Screening Note   Patient Details  Name: Tina Pham Date of Birth: 03/23/1976   Transition of Care [facility] CM/SW Contact:    Iona Beard, [place] Phone Number: 09/29/2022, 10:13 AM  Pt has no insurance listed but has an active PCP listed in chart so pt will not need [facility] referral.   Transition of Care Department [facility]) has reviewed patient and no TOC needs have been identified at this time. We will continue to monitor patient advancement through interdisciplinary progression rounds. If new patient transition needs arise, please place a TOC consult.

## 2022-09-29 NOTE — Inpatient Diabetes Management (Signed)
Inpatient Diabetes Program Recommendations  AACE/ADA: New Consensus Statement on Inpatient Glycemic Control (2015)  Target Ranges:  Prepandial:   less than 140 mg/dL      Peak postprandial:   less than 180 mg/dL (1-2 hours)      Critically ill patients:  140 - 180 mg/dL   Lab Results  Component Value Date   GLUCAP 280 (H) 09/29/2022   HGBA1C 7.8 (H) 03/24/2021    Review of Glycemic Control  Diabetes history: type 2 Outpatient Diabetes medications: Glucotrol 10 mg daily (does not take Metformin) Current orders for Inpatient glycemic control: Semglee 10 units at HS, Novolog 0-15 units correction scale TID, Novolog 0-5 units HS scale, Glucotrol 10 mg daily  Inpatient Diabetes Program Recommendations:   Spoke with patient on the phone. Diabetes coordinator is on [facility] today. Patient states that she was diagnosed with gestational diabetes about 25 years ago and was diagnosed with diabetes about 20+ years ago. States that she is only on Glucotrol at home, but has not been taking it the way it is prescribed. States that she has a blood glucose meter at home, but does not have strips. Recommended the [employer] meter and strips due to cost concerns.   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 She does have a PCP and has an appointment on 10/17/22. She has not seen the PCP since 2022. Patient states that her last HgbA1C was 7.6% and was 9.1% before that one. She is very willing to do better with her diabetes care. Discussed that controlling her blood sugars will help healing and prevent more infection.   Will continue to monitor blood sugars while in the hospital. Harvel Ricks RN BSN CDE Diabetes Coordinator Pager: [phone]  8am-5pm

## 2022-09-29 NOTE — Discharge Summary (Signed)
Physician Discharge Summary  Tina Pham [id number] DOB: 1976/04/01 DOA: 09/28/2022  PCP: Leonie Douglas, MD  Admit date: 09/28/2022  Discharge date: 09/29/2022  Admitted From: Home  Disposition: Home  Recommendations for Outpatient Follow-up:  Follow up with PCP in 1-2 weeks Please obtain BMP/CBC in one week Will finish doxycycline course outpatient ABI suggestive of LLE occlusive vascular disease, but asymptomatic, recommend continuing to monitor outpatient Please follow up on the following pending results: Hemoglobin A1c  Home Health: Not recommended  Equipment/Devices: None  Discharge Condition: Stable  CODE STATUS: Full  Diet recommendation: Heart Healthy/Carb Modified  Brief/Interim Summary: Tina Pham is a 47 y.o. female with medical history significant of GERD, hypertension, T2DM, peripheral neuropathy who presents to the emergency department due to worsening pain in the great right toe.  Patient was seen in the ED on 3/4,  diagnosed with cellulitis, and prescribed with doxycycline.  Had a CBG of 271 in the ED, x-ray of toe showed no acute fracture or dislocation.  I&D of toe bulla was performed by ED physician without complications and with copious purulent drainage.  Patient treated with IV vancomycin and oxycodone 5 mg PO x1 dose.  Remained hyperglycemic and home glipizide 10 mg was restarted.  ABI was suggestive of a degree of arterial obstruction of LLE but excluded clinically significant arterial occlusive disease or RLE where cellulitis is located.  Patient also complained of right wrist and hand pain and OT evaluation was conducted.  Patient discharged with plan to complete outpatient doxycycline regiment.  Discharge Diagnoses:  Principal Problem:   Cellulitis of great toe of right foot Active Problems:   Open wound of right great toe   Uncontrolled diabetes mellitus with hyperglycemia (HCC)   GERD (gastroesophageal reflux disease)   Essential  hypertension   Mixed hyperlipidemia   Obesity (BMI 30-39.9)   Peripheral neuropathy  Hospitalized with refractory pain in the setting of cellulitis of right great toe on outpatient doxycycline regimen.  Discharge Instructions  Discharge Instructions     Diet - low sodium heart healthy   Complete by: As directed    If the dressing is still on your incision site when you go home, remove it on the third day after your surgery date. Remove dressing if it begins to fall off, or if it is dirty or damaged before the third day.   Complete by: As directed    Increase activity slowly   Complete by: As directed       Allergies as of 09/29/2022       Reactions   Codeine Other (See Comments)   Upset Stomach   Metformin Other (See Comments)   Will not take per MD advisement    Benadryl [diphenhydramine Hcl] Palpitations   Compazine Palpitations        Medication List     TAKE these medications    Accu-Chek Softclix Lancets lancets SMARTSIG:Topical   aspirin EC 81 MG tablet Take 81 mg by mouth daily. Swallow whole.   atorvastatin 10 MG tablet Commonly known as: LIPITOR TAKE 1 TABLET(10 MG) BY MOUTH DAILY   cefadroxil 500 MG capsule Commonly known as: DURICEF Take 1 capsule (500 mg total) by mouth 2 (two) times daily.   doxycycline 100 MG capsule Commonly known as: VIBRAMYCIN Take 1 capsule (100 mg total) by mouth 2 (two) times daily.   esomeprazole 20 MG capsule Commonly known as: NEXIUM Take 20 mg by mouth daily as needed (acid reflux).   FeroSul 325 (65  FE) MG tablet Generic drug: ferrous sulfate Take 325 mg by mouth 2 (two) times daily.   gabapentin 300 MG capsule Commonly known as: NEURONTIN TAKE 1 CAPSULE(300 MG) BY MOUTH THREE TIMES DAILY   glipiZIDE 10 MG tablet Commonly known as: GLUCOTROL Take 10 mg by mouth in the morning.   HYDROcodone-acetaminophen 5-325 MG tablet Commonly known as: NORCO/VICODIN Take 1 tablet by mouth every 6 (six) hours as needed  for moderate pain.   tiZANidine 4 MG tablet Commonly known as: Zanaflex Take 1 tablet (4 mg total) by mouth every 6 (six) hours as needed for muscle spasms.               Discharge Care Instructions  (From admission, onward)           Start     Ordered   09/29/22 0000  If the dressing is still on your incision site when you go home, remove it on the third day after your surgery date. Remove dressing if it begins to fall off, or if it is dirty or damaged before the third day.        09/29/22 1441            Follow-up Information     Leonie Douglas, MD. Schedule an appointment as soon as possible for a visit in 1 week(s).   Specialties: Family Medicine, Sports Medicine Contact information: [address] [place] [address] [phone]                Allergies  Allergen Reactions   Codeine Other (See Comments)    Upset Stomach   Metformin Other (See Comments)    Will not take per MD advisement    Benadryl [Diphenhydramine Hcl] Palpitations   Compazine Palpitations    Consultations: None  Procedures/Studies: DG Toe Great Right  Result Date: 09/28/2022 CLINICAL DATA:  Sore on the great toe. EXAM: RIGHT GREAT TOE COMPARISON:  None Available. FINDINGS: There is no acute fracture or dislocation. The bones are well mineralized. No arthritic changes. No bone erosion or periosteal elevation. Soft tissue swelling of the medial aspect of the great toe. No radiopaque foreign object or soft tissue gas. IMPRESSION: 1. No acute fracture or dislocation. 2. Soft tissue swelling of the medial aspect of the great toe. Electronically Signed   By: Anner Crete M.D.   On: 09/28/2022 21:41   XR Cervical Spine 2 or 3 views  Result Date: 09/28/2022 AP lateral cervical spine images are obtained and reviewed.  Lateral chronic calcifications noted.  Spondylosis changes worse at C6-7 with anterior posterior spur some disc space narrowing and less prominent posterior  osteophytes at C5-6. Impression: Mid cervical spondylosis.  Bilateral carotid calcification.  DG Toe Great Right  Result Date: 09/25/2022 CLINICAL DATA:  Laceration to base of toe EXAM: RIGHT GREAT TOE COMPARISON:  None Available. FINDINGS: No fracture or dislocation is seen.  No cortical destruction. The joint spaces are preserved. The visualized soft tissues are unremarkable. No radiopaque foreign body is seen. IMPRESSION: No radiographic findings of osteomyelitis. Mild soft tissue swelling. Electronically Signed   By: Julian Hy M.D.   On: 09/25/2022 22:11     Discharge Exam: Vitals:   09/29/22 0813 09/29/22 1412  BP: (!) 143/77 (!) 141/77  Pulse: 85 79  Resp: 18 20  Temp:  99.3 F (37.4 C)  SpO2: 100% 99%   Vitals:   09/29/22 0116 09/29/22 0500 09/29/22 0813 09/29/22 1412  BP: (!) 176/84 134/69 Marland Kitchen)  143/77 (!) 141/77  Pulse: 79 83 85 79  Resp: '19 18 18 20  '$ Temp: 97.6 F (36.4 C) 98 F (36.7 C)  99.3 F (37.4 C)  TempSrc: Oral Oral  Oral  SpO2: 100% 99% 100% 99%  Weight:      Height:        General: Obese female resting in bed in no acute distress.  Pleasant appropriate. HEENT: None.  No mucosal pallor. Lungs: CTAB.  Normal work of breathing on room air. Cardiovascular: Regular rate and rhythm.  Normal S1/2.  No murmurs/rubs/gallops. Abdomen: Soft, nondistended.  No tenderness to fusion. GU: No suprapubic tenderness. Extremities: Left great toe amputated.  Cellulitis of right great toe unchanged from last photo, see chart.  No peripheral edema.  No cyanosis or clubbing.  Capillary refill less than 2 seconds. MSK: Tenderness to palpation of right anterior wrist, continuing through palm of hand and into third and fourth digits.  Pain greater with extension and flexion.  Reduced right hand grip strength.  No overlying skin changes Skin: No rashes or lesions beyond right toe cellulitis. Neuro: Alert and oriented x 4.  No focal neurological deficit.   The results of  significant diagnostics from this hospitalization (including imaging, microbiology, ancillary and laboratory) are listed below for reference.    Microbiology: Recent Results (from the past 240 hour(s))  Resp panel by RT-PCR (RSV, Flu A&B, Covid) Anterior Nasal Swab     Status: None   Collection Time: 09/25/22  7:23 PM   Specimen: Anterior Nasal Swab  Result Value Ref Range Status   SARS Coronavirus 2 by RT PCR NEGATIVE NEGATIVE Final    Comment: (NOTE) SARS-CoV-2 target nucleic acids are NOT DETECTED.  The SARS-CoV-2 RNA is generally detectable in upper respiratory specimens during the acute phase of infection. The lowest concentration of SARS-CoV-2 viral copies this assay can detect is 138 copies/mL. A negative result does not preclude SARS-Cov-2 infection and should not be used as the sole basis for treatment or other patient management decisions. A negative result may occur with  improper specimen collection/handling, submission of specimen other than nasopharyngeal swab, presence of viral mutation(s) within the areas targeted by this assay, and inadequate number of viral copies(<138 copies/mL). A negative result must be combined with clinical observations, patient history, and epidemiological information. The expected result is Negative.  Fact Sheet for Patients:  EntrepreneurPulse.com.au  Fact Sheet for Healthcare Providers:  IncredibleEmployment.be  This test is no t yet approved or cleared by the [place] FDA and  has been authorized for detection and/or diagnosis of SARS-CoV-2 by FDA under an Emergency Use Authorization (EUA). This EUA will remain  in effect (meaning this test can be used) for the duration of the COVID-19 declaration under Section 564(b)(1) of the Act, 21 U.S.C.section 360bbb-3(b)(1), unless the authorization is terminated  or revoked sooner.       Influenza A by PCR NEGATIVE NEGATIVE Final   Influenza B by PCR  NEGATIVE NEGATIVE Final    Comment: (NOTE) The Xpert Xpress SARS-CoV-2/FLU/RSV plus assay is intended as an aid in the diagnosis of influenza from Nasopharyngeal swab specimens and should not be used as a sole basis for treatment. Nasal washings and aspirates are unacceptable for Xpert Xpress SARS-CoV-2/FLU/RSV testing.  Fact Sheet for Patients: EntrepreneurPulse.com.au  Fact Sheet for Healthcare Providers: IncredibleEmployment.be  This test is not yet approved or cleared by the [place] FDA and has been authorized for detection and/or diagnosis of SARS-CoV-2 by FDA under an  Emergency Use Authorization (EUA). This EUA will remain in effect (meaning this test can be used) for the duration of the COVID-19 declaration under Section 564(b)(1) of the Act, 21 U.S.C. section 360bbb-3(b)(1), unless the authorization is terminated or revoked.     Resp Syncytial Virus by PCR NEGATIVE NEGATIVE Final    Comment: (NOTE) Fact Sheet for Patients: EntrepreneurPulse.com.au  Fact Sheet for Healthcare Providers: IncredibleEmployment.be  This test is not yet approved or cleared by the [place] FDA and has been authorized for detection and/or diagnosis of SARS-CoV-2 by FDA under an Emergency Use Authorization (EUA). This EUA will remain in effect (meaning this test can be used) for the duration of the COVID-19 declaration under Section 564(b)(1) of the Act, 21 U.S.C. section 360bbb-3(b)(1), unless the authorization is terminated or revoked.  Performed at [facility], [address] [place], [place] [address]   Group A Strep by PCR     Status: None   Collection Time: 09/25/22  9:50 PM   Specimen: Throat; Sterile Swab  Result Value Ref Range Status   Group A Strep by PCR NOT DETECTED NOT DETECTED Final    Comment: Performed at [facility], [address] [place],  [address]     Labs: BNP (last 3  results) No results for input(s): "BNP" in the last 8760 hours. Basic Metabolic Panel: Recent Labs  Lab 09/28/22 2145 09/29/22 0411  NA 136 137  K 3.6 3.8  CL 102 105  CO2 24 23  GLUCOSE 271* 206*  BUN 15 12  CREATININE 0.55 0.50  CALCIUM 8.7* 8.8*  MG  --  2.0  PHOS  --  2.6   Liver Function Tests: Recent Labs  Lab 09/28/22 2145 09/29/22 0411  AST 17 16  ALT 11 10  ALKPHOS 59 53  BILITOT 0.5 0.4  PROT 7.2 6.8  ALBUMIN 3.8 3.6   No results for input(s): "LIPASE", "AMYLASE" in the last 168 hours. No results for input(s): "AMMONIA" in the last 168 hours. CBC: Recent Labs  Lab 09/28/22 2145 09/29/22 0411  WBC 7.3 7.1  NEUTROABS 4.1  --   HGB 10.1* 10.3*  HCT 33.5* 35.0*  MCV 80.3 80.6  PLT 359 399   Cardiac Enzymes: No results for input(s): "CKTOTAL", "CKMB", "CKMBINDEX", "TROPONINI" in the last 168 hours. BNP: Invalid input(s): "POCBNP" CBG: Recent Labs  Lab 09/25/22 2157 09/29/22 0736 09/29/22 1125  GLUCAP 298* 241* 280*   D-Dimer No results for input(s): "DDIMER" in the last 72 hours. Hgb A1c No results for input(s): "HGBA1C" in the last 72 hours. Lipid Profile No results for input(s): "CHOL", "HDL", "LDLCALC", "TRIG", "CHOLHDL", "LDLDIRECT" in the last 72 hours. Thyroid function studies No results for input(s): "TSH", "T4TOTAL", "T3FREE", "THYROIDAB" in the last 72 hours.  Invalid input(s): "FREET3" Anemia work up No results for input(s): "VITAMINB12", "FOLATE", "FERRITIN", "TIBC", "IRON", "RETICCTPCT" in the last 72 hours. Urinalysis    Component Value Date/Time   COLORURINE YELLOW 03/16/2022 1307   APPEARANCEUR HAZY (A) 03/16/2022 1307   LABSPEC 1.024 03/16/2022 1307   PHURINE 5.0 03/16/2022 1307   GLUCOSEU 150 (A) 03/16/2022 1307   HGBUR MODERATE (A) 03/16/2022 1307   BILIRUBINUR NEGATIVE 03/16/2022 [place] 03/16/2022 1307   PROTEINUR NEGATIVE 03/16/2022 1307   UROBILINOGEN 0.2 04/19/2015 1552   NITRITE NEGATIVE  03/16/2022 1307   LEUKOCYTESUR MODERATE (A) 03/16/2022 1307   Sepsis Labs Recent Labs  Lab 09/28/22 2145 09/29/22 0411  WBC 7.3 7.1   Microbiology Recent  Results (from the past 240 hour(s))  Resp panel by RT-PCR (RSV, Flu A&B, Covid) Anterior Nasal Swab     Status: None   Collection Time: 09/25/22  7:23 PM   Specimen: Anterior Nasal Swab  Result Value Ref Range Status   SARS Coronavirus 2 by RT PCR NEGATIVE NEGATIVE Final    Comment: (NOTE) SARS-CoV-2 target nucleic acids are NOT DETECTED.  The SARS-CoV-2 RNA is generally detectable in upper respiratory specimens during the acute phase of infection. The lowest concentration of SARS-CoV-2 viral copies this assay can detect is 138 copies/mL. A negative result does not preclude SARS-Cov-2 infection and should not be used as the sole basis for treatment or other patient management decisions. A negative result may occur with  improper specimen collection/handling, submission of specimen other than nasopharyngeal swab, presence of viral mutation(s) within the areas targeted by this assay, and inadequate number of viral copies(<138 copies/mL). A negative result must be combined with clinical observations, patient history, and epidemiological information. The expected result is Negative.  Fact Sheet for Patients:  EntrepreneurPulse.com.au  Fact Sheet for Healthcare Providers:  IncredibleEmployment.be  This test is no t yet approved or cleared by the [place] FDA and  has been authorized for detection and/or diagnosis of SARS-CoV-2 by FDA under an Emergency Use Authorization (EUA). This EUA will remain  in effect (meaning this test can be used) for the duration of the COVID-19 declaration under Section 564(b)(1) of the Act, 21 U.S.C.section 360bbb-3(b)(1), unless the authorization is terminated  or revoked sooner.       Influenza A by PCR NEGATIVE NEGATIVE Final   Influenza B by PCR  NEGATIVE NEGATIVE Final    Comment: (NOTE) The Xpert Xpress SARS-CoV-2/FLU/RSV plus assay is intended as an aid in the diagnosis of influenza from Nasopharyngeal swab specimens and should not be used as a sole basis for treatment. Nasal washings and aspirates are unacceptable for Xpert Xpress SARS-CoV-2/FLU/RSV testing.  Fact Sheet for Patients: EntrepreneurPulse.com.au  Fact Sheet for Healthcare Providers: IncredibleEmployment.be  This test is not yet approved or cleared by the [place] FDA and has been authorized for detection and/or diagnosis of SARS-CoV-2 by FDA under an Emergency Use Authorization (EUA). This EUA will remain in effect (meaning this test can be used) for the duration of the COVID-19 declaration under Section 564(b)(1) of the Act, 21 U.S.C. section 360bbb-3(b)(1), unless the authorization is terminated or revoked.     Resp Syncytial Virus by PCR NEGATIVE NEGATIVE Final    Comment: (NOTE) Fact Sheet for Patients: EntrepreneurPulse.com.au  Fact Sheet for Healthcare Providers: IncredibleEmployment.be  This test is not yet approved or cleared by the [place] FDA and has been authorized for detection and/or diagnosis of SARS-CoV-2 by FDA under an Emergency Use Authorization (EUA). This EUA will remain in effect (meaning this test can be used) for the duration of the COVID-19 declaration under Section 564(b)(1) of the Act, 21 U.S.C. section 360bbb-3(b)(1), unless the authorization is terminated or revoked.  Performed at [facility], [address] [place], [place] [address]   Group A Strep by PCR     Status: None   Collection Time: 09/25/22  9:50 PM   Specimen: Throat; Sterile Swab  Result Value Ref Range Status   Group A Strep by PCR NOT DETECTED NOT DETECTED Final    Comment: Performed at [facility], [address] [place], [place] [address]     Time coordinating  discharge: 35 minutes  SIGNED:   Dionne Bucy, MS4  [facility] 09/29/2022, 2:42 PM  If 7PM-7AM, please contact night-coverage [email or web address]

## 2022-09-29 NOTE — Progress Notes (Signed)
IV removed and DC instructions reviewed.  Script sent to pharmacy.  Getting dressed and calling for ride.  Will be transported by [facility] to main entrance

## 2022-09-29 NOTE — Progress Notes (Signed)
Pharmacy Antibiotic Note  CHAZITY BRU is a 47 y.o. female admitted on 09/28/2022 with cellulitis.  Pharmacy has been consulted for Vancomycin dosing.  Vancomycin 1 g IV given in ED at  10 pm  Plan: Vancomycin 1250 mg IV q12h  Height: '5\' 8"'$  (172.7 cm) Weight: 95.8 kg (211 lb 3.2 oz) IBW/kg (Calculated) : 63.9  Temp (24hrs), Avg:98.3 F (36.8 C), Min:97.6 F (36.4 C), Max:98.6 F (37 C)  Recent Labs  Lab 09/28/22 2145  WBC 7.3  CREATININE 0.55    Estimated Creatinine Clearance: 106.4 mL/min (by C-G formula based on SCr of 0.55 mg/dL).    Allergies  Allergen Reactions   Codeine Other (See Comments)    Upset Stomach   Metformin Other (See Comments)    Will not take per MD advisement    Benadryl [Diphenhydramine Hcl] Palpitations   Compazine Palpitations     Caryl Pina 09/29/2022 2:43 AM

## 2022-09-29 NOTE — ED Provider Notes (Signed)
..  Incision and Drainage  Date/Time: 09/29/2022 12:51 AM  Performed by: Maudie Flakes, MD Authorized by: Maudie Flakes, MD   Consent:    Consent obtained:  Verbal   Consent given by:  Patient   Risks, benefits, and alternatives were discussed: yes     Risks discussed:  Bleeding, damage to other organs, infection, incomplete drainage and pain Universal protocol:    Procedure explained and questions answered to patient or proxy's satisfaction: yes     Immediately prior to procedure, a time out was called: yes     Patient identity confirmed:  Verbally with patient Location:    Type:  Bulla   Size:  3cm   Location: toe. Pre-procedure details:    Skin preparation:  Chlorhexidine with alcohol Sedation:    Sedation type:  None Anesthesia:    Anesthesia method:  Topical application   Topical anesthesia: freeze spray. Procedure type:    Complexity:  Simple Procedure details:    Incision types:  Single straight   Incision depth:  Dermal   Wound management:  Debrided   Drainage:  Purulent   Drainage amount:  Copious   Wound treatment:  Wound left open   Packing materials:  None Post-procedure details:    Procedure completion:  Tolerated well, no immediate complications     Maudie Flakes, MD 09/29/22 [phone]

## 2022-09-29 NOTE — ED Notes (Signed)
Dr Sedonia Small at bedside doing I&D prior to pt being transported upstairs per Dr Josephine Cables request.

## 2022-09-30 LAB — HEMOGLOBIN A1C
Hgb A1c MFr Bld: 11.1 % — ABNORMAL HIGH (ref 4.8–5.6)
Mean Plasma Glucose: 272 mg/dL

## 2022-10-02 ENCOUNTER — Other Ambulatory Visit: Payer: Self-pay | Admitting: Orthopedic Surgery

## 2022-10-03 MED ORDER — HYDROCODONE-ACETAMINOPHEN 5-325 MG PO TABS: 1.0000 | ORAL_TABLET | Freq: Four times a day (QID) | ORAL | 0 refills | Status: DC | PRN

## 2022-10-09 ENCOUNTER — Encounter: Payer: Self-pay | Admitting: Orthopedic Surgery

## 2022-10-09 ENCOUNTER — Other Ambulatory Visit: Payer: Self-pay | Admitting: Orthopedic Surgery

## 2022-10-09 ENCOUNTER — Ambulatory Visit (INDEPENDENT_AMBULATORY_CARE_PROVIDER_SITE_OTHER): Payer: Self-pay | Admitting: Orthopedic Surgery

## 2022-10-09 DIAGNOSIS — Z9889 Other specified postprocedural states: Secondary | ICD-10-CM

## 2022-10-09 DIAGNOSIS — G8929 Other chronic pain: Secondary | ICD-10-CM

## 2022-10-09 DIAGNOSIS — M62838 Other muscle spasm: Secondary | ICD-10-CM

## 2022-10-09 MED ORDER — TIZANIDINE HCL 4 MG PO TABS: 4.0000 mg | ORAL_TABLET | Freq: Four times a day (QID) | ORAL | 0 refills | Status: DC | PRN

## 2022-10-09 MED ORDER — HYDROCODONE-ACETAMINOPHEN 5-325 MG PO TABS: 1.0000 | ORAL_TABLET | Freq: Four times a day (QID) | ORAL | 0 refills | Status: DC | PRN

## 2022-10-09 NOTE — Progress Notes (Signed)
Chief Complaint  Patient presents with   Shoulder Pain    Right RCR 05/13/21   Routine 37-month follow-up for chronic pain right shoulder patient has had several surgeries on the right shoulder  September 19, 2018 rotator cuff repair right shoulder  August 04, 2018 1 repeat rotator cuff repair right shoulder after trauma  May 13, 2021 right shoulder rotator cuff repair distal clavicle excision   The patient has not been able to come off of opioids.  Part of the issue was that she had a severe cardiovascular problem which prevented her from getting surgery on her right shoulder and had to be put on opioids to control pain as she was on blood thinner and cannot take NSAIDs.  She is now also undergoing workup for cervical disc disease  Current opioid hydrocodone 11/24/2023 1 every 6 hours as needed for pain last prescription was today  The shoulder pain is reasonably controlled with this medication  There is an element of shoulder pain from the neck pathology and she is considering cervical discectomy and fusion.  It is unclear at this time if surgical decompression will eliminate all of her pain as she now has chronic pain syndrome as well.  She is not having any complications from the opioid and we will continue at the same dose prescription was written earlier today

## 2022-10-16 ENCOUNTER — Other Ambulatory Visit: Payer: Self-pay | Admitting: Orthopedic Surgery

## 2022-10-17 MED ORDER — HYDROCODONE-ACETAMINOPHEN 5-325 MG PO TABS: 1.0000 | ORAL_TABLET | Freq: Four times a day (QID) | ORAL | 0 refills | Status: DC | PRN

## 2022-10-23 ENCOUNTER — Other Ambulatory Visit: Payer: Self-pay | Admitting: Orthopedic Surgery

## 2022-10-23 MED ORDER — HYDROCODONE-ACETAMINOPHEN 5-325 MG PO TABS: 1.0000 | ORAL_TABLET | Freq: Four times a day (QID) | ORAL | 0 refills | Status: DC | PRN

## 2022-10-31 ENCOUNTER — Other Ambulatory Visit: Payer: Self-pay | Admitting: Orthopedic Surgery

## 2022-10-31 MED ORDER — HYDROCODONE-ACETAMINOPHEN 5-325 MG PO TABS: 1.0000 | ORAL_TABLET | Freq: Four times a day (QID) | ORAL | 0 refills | Status: DC | PRN

## 2022-11-06 ENCOUNTER — Other Ambulatory Visit: Payer: Self-pay | Admitting: Orthopedic Surgery

## 2022-11-06 MED ORDER — HYDROCODONE-ACETAMINOPHEN 5-325 MG PO TABS: 1.0000 | ORAL_TABLET | Freq: Four times a day (QID) | ORAL | 0 refills | Status: DC | PRN

## 2022-11-09 ENCOUNTER — Ambulatory Visit: Payer: Self-pay | Admitting: Orthopaedic Surgery

## 2022-11-13 ENCOUNTER — Other Ambulatory Visit: Payer: Self-pay | Admitting: Orthopedic Surgery

## 2022-11-13 MED ORDER — HYDROCODONE-ACETAMINOPHEN 5-325 MG PO TABS: 1.0000 | ORAL_TABLET | Freq: Four times a day (QID) | ORAL | 0 refills | Status: DC | PRN

## 2022-11-20 ENCOUNTER — Other Ambulatory Visit: Payer: Self-pay | Admitting: Orthopedic Surgery

## 2022-11-20 MED ORDER — HYDROCODONE-ACETAMINOPHEN 5-325 MG PO TABS: 1.0000 | ORAL_TABLET | Freq: Four times a day (QID) | ORAL | 0 refills | Status: DC | PRN

## 2022-11-27 ENCOUNTER — Other Ambulatory Visit: Payer: Self-pay | Admitting: Orthopedic Surgery

## 2022-11-27 MED ORDER — HYDROCODONE-ACETAMINOPHEN 5-325 MG PO TABS: 1.0000 | ORAL_TABLET | Freq: Four times a day (QID) | ORAL | 0 refills | Status: DC | PRN

## 2022-12-04 ENCOUNTER — Other Ambulatory Visit: Payer: Self-pay | Admitting: Orthopedic Surgery

## 2022-12-04 MED ORDER — HYDROCODONE-ACETAMINOPHEN 5-325 MG PO TABS: 1.0000 | ORAL_TABLET | Freq: Four times a day (QID) | ORAL | 0 refills | Status: DC | PRN

## 2022-12-11 ENCOUNTER — Other Ambulatory Visit: Payer: Self-pay | Admitting: Orthopedic Surgery

## 2022-12-11 MED ORDER — HYDROCODONE-ACETAMINOPHEN 5-325 MG PO TABS: 1.0000 | ORAL_TABLET | Freq: Four times a day (QID) | ORAL | 0 refills | Status: DC | PRN

## 2022-12-19 ENCOUNTER — Other Ambulatory Visit: Payer: Self-pay | Admitting: Orthopedic Surgery

## 2022-12-19 MED ORDER — HYDROCODONE-ACETAMINOPHEN 5-325 MG PO TABS: 1.0000 | ORAL_TABLET | Freq: Four times a day (QID) | ORAL | 0 refills | Status: DC | PRN

## 2022-12-25 ENCOUNTER — Other Ambulatory Visit: Payer: Self-pay | Admitting: Orthopedic Surgery

## 2022-12-25 MED ORDER — HYDROCODONE-ACETAMINOPHEN 5-325 MG PO TABS: 1.0000 | ORAL_TABLET | Freq: Four times a day (QID) | ORAL | 0 refills | Status: DC | PRN

## 2023-01-01 ENCOUNTER — Other Ambulatory Visit: Payer: Self-pay | Admitting: Orthopedic Surgery

## 2023-01-01 MED ORDER — HYDROCODONE-ACETAMINOPHEN 5-325 MG PO TABS: 1.0000 | ORAL_TABLET | Freq: Four times a day (QID) | ORAL | 0 refills | Status: DC | PRN

## 2023-01-08 ENCOUNTER — Ambulatory Visit: Payer: Self-pay | Admitting: Orthopedic Surgery

## 2023-01-08 ENCOUNTER — Other Ambulatory Visit: Payer: Self-pay | Admitting: Orthopedic Surgery

## 2023-01-09 ENCOUNTER — Other Ambulatory Visit: Payer: Self-pay | Admitting: Orthopedic Surgery

## 2023-01-15 ENCOUNTER — Encounter (HOSPITAL_COMMUNITY): Payer: Self-pay | Admitting: Emergency Medicine

## 2023-01-15 ENCOUNTER — Other Ambulatory Visit: Payer: Self-pay

## 2023-01-15 ENCOUNTER — Emergency Department (HOSPITAL_COMMUNITY)
Admission: EM | Admit: 2023-01-15 | Discharge: 2023-01-15 | Disposition: A | Discharge status: Home / Self Care | Payer: Self-pay | Attending: Emergency Medicine | Admitting: Emergency Medicine

## 2023-01-15 ENCOUNTER — Emergency Department (HOSPITAL_COMMUNITY): Payer: Self-pay

## 2023-01-15 DIAGNOSIS — I10 Essential (primary) hypertension: Secondary | ICD-10-CM | POA: Insufficient documentation

## 2023-01-15 DIAGNOSIS — R42 Dizziness and giddiness: Secondary | ICD-10-CM | POA: Insufficient documentation

## 2023-01-15 DIAGNOSIS — E1165 Type 2 diabetes mellitus with hyperglycemia: Secondary | ICD-10-CM | POA: Insufficient documentation

## 2023-01-15 DIAGNOSIS — Z7982 Long term (current) use of aspirin: Secondary | ICD-10-CM | POA: Insufficient documentation

## 2023-01-15 DIAGNOSIS — Z7984 Long term (current) use of oral hypoglycemic drugs: Secondary | ICD-10-CM | POA: Insufficient documentation

## 2023-01-15 DIAGNOSIS — R519 Headache, unspecified: Secondary | ICD-10-CM | POA: Insufficient documentation

## 2023-01-15 LAB — BASIC METABOLIC PANEL
Anion gap: 10 (ref 5–15)
BUN: 10 mg/dL (ref 6–20)
CO2: 22 mmol/L (ref 22–32)
Calcium: 8.9 mg/dL (ref 8.9–10.3)
Chloride: 100 mmol/L (ref 98–111)
Creatinine, Ser: 0.54 mg/dL (ref 0.44–1.00)
GFR, Estimated: >60 mL/min (ref >60)
Glucose, Bld: 293 mg/dL — ABNORMAL HIGH (ref 70–99)
Potassium: 3.8 mmol/L (ref 3.5–5.1)
Sodium: 132 mmol/L — ABNORMAL LOW (ref 135–145)

## 2023-01-15 LAB — CBC
HCT: 38 % (ref 36.0–46.0)
Hemoglobin: 12.4 g/dL (ref 12.0–15.0)
MCH: 28.5 pg (ref 26.0–34.0)
MCHC: 32.6 g/dL (ref 30.0–36.0)
MCV: 87.4 fL (ref 80.0–100.0)
Platelets: 301 10*3/uL (ref 150–400)
RBC: 4.35 MIL/uL (ref 3.87–5.11)
RDW: 14.6 % (ref 11.5–15.5)
WBC: 8.3 10*3/uL (ref 4.0–10.5)
nRBC: 0 % (ref 0.0–0.2)

## 2023-01-15 LAB — POC URINE PREG, ED: Preg Test, Ur: NEGATIVE

## 2023-01-15 MED ORDER — DEXAMETHASONE SODIUM PHOSPHATE 10 MG/ML IJ SOLN
10.0000 mg | Freq: Once | INTRAMUSCULAR | Status: AC
  Administered 2023-01-15: 10 mg via INTRAVENOUS
  Filled 2023-01-15: qty 1

## 2023-01-15 MED ORDER — METOCLOPRAMIDE HCL 5 MG/ML IJ SOLN
10.0000 mg | Freq: Once | INTRAMUSCULAR | Status: AC
  Administered 2023-01-15: 10 mg via INTRAVENOUS
  Filled 2023-01-15: qty 2

## 2023-01-15 MED ORDER — SODIUM CHLORIDE 0.9 % IV SOLN: INTRAVENOUS | Status: DC

## 2023-01-15 MED ORDER — SODIUM CHLORIDE 0.9 % IV BOLUS
1000.0000 mL | Freq: Once | INTRAVENOUS | Status: AC
  Administered 2023-01-15: 1000 mL via INTRAVENOUS

## 2023-01-15 MED ORDER — KETOROLAC TROMETHAMINE 15 MG/ML IJ SOLN
15.0000 mg | Freq: Once | INTRAMUSCULAR | Status: AC
  Administered 2023-01-15: 15 mg via INTRAVENOUS
  Filled 2023-01-15: qty 1

## 2023-01-15 NOTE — Discharge Instructions (Addendum)
Evaluation for your headache today was overall reassuring.  Suspect it is a migraine.  Recommending continued conservative treatment at home.  If you have facial droop, slurred speech, weakness or numbness in your extremities, changes in your gait or any other concern please return emerged part for evaluation.  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

## 2023-01-15 NOTE — ED Triage Notes (Signed)
Pt via POV c/o right frontal migraine headache x 3 days. Pt has been nauseated and is sensitive to light. Appears uncomfortable in triage and rates pain 8/10.

## 2023-01-15 NOTE — ED Provider Notes (Signed)
[place] EMERGENCY DEPARTMENT AT [facility] Provider Note   CSN: [id number] Arrival date & time: 01/15/23  1205     History {Add pertinent medical, surgical, social history, OB history to HPI:1} Chief Complaint  Patient presents with   Headache   HPI Tina Pham is a 47 y.o. female with history of migraines, diabetes, hypertension, and PAD presenting for headache.  Started 3 days ago.  Pain begins behind her right eye and radiates to the back of her head. She states that the radiation to the back of her head is different than her usual migraines.  Also endorses some intermittent dizziness that is worse with standing.  Improves with sitting down. Only last for minutes. States her vision is blurry at times. This is common with her previous migraines. Denies fever. Denies nuchal rigidity.  Has been taking Tylenol, ibuprofen and oxycodone at home which has provided minimal relief.   Headache      Home Medications Prior to Admission medications   Medication Sig Start Date End Date Taking? Authorizing Provider  Accu-Chek Softclix Lancets lancets SMARTSIG:Topical 04/27/21   [provider]  aspirin EC 81 MG tablet Take 81 mg by mouth daily. Swallow whole.    [provider]  atorvastatin (LIPITOR) 10 MG tablet TAKE 1 TABLET(10 MG) BY MOUTH DAILY 09/26/21   Maeola Harman, MD  doxycycline (VIBRAMYCIN) 100 MG capsule Take 1 capsule (100 mg total) by mouth 2 (two) times daily. 09/29/22   Sherryll Burger, Pratik D, DO  esomeprazole (NEXIUM) 20 MG capsule Take 20 mg by mouth daily as needed (acid reflux).    [provider]  FEROSUL 325 (65 Fe) MG tablet Take 325 mg by mouth 2 (two) times daily. 04/27/21   [provider]  gabapentin (NEURONTIN) 300 MG capsule TAKE 1 CAPSULE(300 MG) BY MOUTH THREE TIMES DAILY 05/10/22   Vickki Hearing, MD  glipiZIDE (GLUCOTROL) 10 MG tablet Take 10 mg by mouth in the morning. 04/13/21   [provider]  HYDROcodone-acetaminophen (NORCO/VICODIN) 5-325 MG tablet Take 1 tablet by mouth every 6 (six) hours as needed for moderate pain. 01/01/23   Vickki Hearing, MD  tiZANidine (ZANAFLEX) 4 MG tablet Take 1 tablet (4 mg total) by mouth every 6 (six) hours as needed for muscle spasms. 10/09/22   Vickki Hearing, MD  metFORMIN (GLUCOPHAGE) 500 MG tablet Take 1 tablet (500 mg total) by mouth 2 (two) times daily with a meal. 06/15/20 06/15/20  Elson Areas, PA-C      Allergies    Codeine, Metformin, Benadryl [diphenhydramine hcl], and Compazine    Review of Systems   Review of Systems  Neurological:  Positive for headaches.    Physical Exam   Vitals:   01/15/23 1224 01/15/23 1645  BP: (!) 162/85 (!) 164/75  Pulse: 90 67  Resp: 16 15  Temp: 98.9 F (37.2 C) 99 F (37.2 C)  SpO2: 97% 95%    CONSTITUTIONAL:  well-appearing, NAD NEURO:  GCS 15. Speech is goal oriented. No deficits appreciated to CN III-XII; symmetric eyebrow raise, no facial drooping, tongue midline. Patient has equal grip strength bilaterally with 5/5 strength against resistance in all major muscle groups bilaterally. Sensation to light touch intact. Patient moves extremities without ataxia. Normal finger-nose-finger. Patient ambulatory with steady gait. EYES:  eyes equal and reactive ENT/NECK:  Supple, no stridor  CARDIO:  Regular rate and rhythm, appears well-perfused  PULM:  No respiratory distress, CTAB GI/GU:  non-distended  MSK/SPINE:  No gross deformities, no edema, moves all extremities  SKIN:  no rash, atraumatic  *Additional and/or pertinent findings included in MDM below  ED Results / Procedures / Treatments   Labs (all labs ordered are listed, but only abnormal results are displayed) Labs Reviewed  BASIC METABOLIC PANEL - Abnormal; Notable for the following components:      Result Value   Sodium 132 (*)    Glucose, Bld 293 (*)    All other components within normal limits  CBC  POC URINE  PREG, ED    EKG None  Radiology MR Brain Wo Contrast (neuro protocol)  Result Date: 01/15/2023 CLINICAL DATA:  Headache, worsening frequency or severity. EXAM: MRI HEAD WITHOUT CONTRAST TECHNIQUE: Multiplanar, multiecho pulse sequences of the brain and surrounding structures were obtained without intravenous contrast. COMPARISON:  11/29/2014 CT.  05/15/2007 MRI. FINDINGS: Brain: Diffusion imaging does not show any acute or subacute infarction. No abnormality affects the brainstem or cerebellum. Within the cerebral hemispheres, there are now a few scattered foci of T2 and FLAIR signal within the cerebral hemispheric white matter consistent with mild chronic small-vessel ischemic change. 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 No large confluent infarction. No mass, hemorrhage, hydrocephalus or extra-axial collection. Vascular: Major vessels at the base of the brain show flow. Skull and upper cervical spine: Negative Sinuses/Orbits: Clear/normal Other: None IMPRESSION: No acute finding by MRI. Mild chronic small-vessel ischemic changes of the cerebral hemispheric white matter. 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 Electronically Signed   By: Paulina Fusi M.D.   On: 01/15/2023 16:52    Procedures Procedures  {Document cardiac monitor, telemetry assessment procedure when appropriate:1}  Medications Ordered in ED Medications  sodium chloride 0.9 % bolus 1,000 mL (0 mLs Intravenous Stopped 01/15/23 1442)    And  0.9 %  sodium chloride infusion (0 mLs Intravenous Stopped 01/15/23 1523)  ketorolac (TORADOL) 15 MG/ML injection 15 mg (15 mg Intravenous Given 01/15/23 1344)  metoCLOPramide (REGLAN) injection 10 mg (10 mg Intravenous Given 01/15/23 1345)  dexamethasone (DECADRON) injection 10 mg (10 mg  Intravenous Given 01/15/23 1346)    ED Course/ Medical Decision Making/ A&P   {   Click here for ABCD2, HEART and other calculatorsREFRESH Note before signing :1}                          Medical Decision Making Amount and/or Complexity of Data Reviewed Labs: ordered. Radiology: ordered.  Risk Prescription drug management.   Initial Impression and Ddx 47 year old well-appearing female presenting for headache.  Exam was unremarkable.  DDx includes ICH, stroke, migraine, electrolyte derangement, dehydration Patient PMH that increases complexity of ED encounter:   history of migraines, diabetes, hypertension, and PAD  Interpretation of Diagnostics I independent reviewed and interpreted the labs as followed: Hyperglycemia  - I independently visualized the following imaging with scope of interpretation limited to determining acute life threatening conditions related to emergency care: MR brain, which revealed nothing acute but evidence of old infarcts  Patient Reassessment and Ultimate Disposition/Management Treated with headache cocktail and volume resuscitation.  Patient states she felt better after treatment.  Given the associated dizziness and visual disturbance prompted further characterization with MR.  Fortunately that study was negative.  Did however reveal evidence of old infarct.  Symptoms  most consistent with a migraine.  Advised to follow-up with neurology.  Discussed return precautions.  Vital stable throughout encounter.  Discharged home in good condition.  Patient management required discussion with the following services or consulting groups:  None  Complexity of Problems Addressed Acute complicated illness or Injury  Additional Data Reviewed and Analyzed Further history obtained from: Past medical history and medications listed in the EMR, Prior ED visit notes, and Recent discharge summary  Patient Encounter Risk Assessment None  {Document critical care time when  appropriate:1} {Document review of labs and clinical decision tools ie heart score, Chads2Vasc2 etc:1}  {Document your independent review of radiology images, and any outside records:1} {Document your discussion with family members, caretakers, and with consultants:1} {Document social determinants of health affecting pt's care:1} {Document your decision making why or why not admission, treatments were needed:1} Final Clinical Impression(s) / ED Diagnoses Final diagnoses:  Nonintractable headache, unspecified chronicity pattern, unspecified headache type    Rx / DC Orders ED Discharge Orders     None

## 2023-01-15 NOTE — ED Notes (Signed)
Patient transported to MRI 

## 2023-01-18 ENCOUNTER — Ambulatory Visit: Payer: Self-pay | Admitting: Orthopedic Surgery

## 2023-01-22 ENCOUNTER — Other Ambulatory Visit: Payer: Self-pay | Admitting: Orthopedic Surgery

## 2023-01-22 DIAGNOSIS — M62838 Other muscle spasm: Secondary | ICD-10-CM

## 2023-01-22 DIAGNOSIS — G8929 Other chronic pain: Secondary | ICD-10-CM

## 2023-01-23 ENCOUNTER — Emergency Department (HOSPITAL_COMMUNITY)
Admission: EM | Admit: 2023-01-23 | Discharge: 2023-01-23 | Disposition: A | Discharge status: Home / Self Care | Payer: Self-pay | Attending: Emergency Medicine | Admitting: Emergency Medicine

## 2023-01-23 ENCOUNTER — Other Ambulatory Visit: Payer: Self-pay

## 2023-01-23 ENCOUNTER — Encounter (HOSPITAL_COMMUNITY): Payer: Self-pay | Admitting: *Deleted

## 2023-01-23 ENCOUNTER — Emergency Department (HOSPITAL_COMMUNITY): Payer: Self-pay

## 2023-01-23 DIAGNOSIS — M25511 Pain in right shoulder: Secondary | ICD-10-CM | POA: Insufficient documentation

## 2023-01-23 DIAGNOSIS — Z7982 Long term (current) use of aspirin: Secondary | ICD-10-CM | POA: Insufficient documentation

## 2023-01-23 MED ORDER — OXYCODONE-ACETAMINOPHEN 5-325 MG PO TABS
1.0000 | ORAL_TABLET | Freq: Once | ORAL | Status: AC
  Administered 2023-01-23: 1 via ORAL
  Filled 2023-01-23: qty 1

## 2023-01-23 MED ORDER — NAPROXEN 500 MG PO TABS: 500.0000 mg | ORAL_TABLET | Freq: Two times a day (BID) | ORAL | 0 refills | Status: DC

## 2023-01-23 NOTE — Discharge Instructions (Signed)
Take the medication as directed with food.  Keep your upcoming appointment with Dr. Romeo Apple for Monday.

## 2023-01-23 NOTE — ED Triage Notes (Signed)
Pt states she was carrying some groceries with right arm yesterday and heard a pop noise.  Pain to shoulder that radiates down right arm. Pt states she was not carrying anything very heavy.  States she has had multiple surgeries to same shoulder.

## 2023-01-24 NOTE — ED Provider Notes (Signed)
[place] EMERGENCY DEPARTMENT AT [facility] Provider Note   CSN: [id number] Arrival date & time: 01/23/23  1222     History  Chief Complaint  Patient presents with   Shoulder Pain    Tina Pham is a 47 y.o. female.   Shoulder Pain Associated symptoms: no fever and no neck pain        Tina Pham is a 47 y.o. female with history of multiple surgeries to right shoulder who presents to the Emergency Department complaining of right shoulder pain.  Symptoms present for one day.  Picked up groceries bags and felt a "pop" to her shoulder.  Sharp pain radiating into right arm and to right upper back/posterior shoulder area.  Pain worsens with movement.  Denies neck pain, numbness or extremity weakness.   Home Medications Prior to Admission medications   Medication Sig Start Date End Date Taking? Authorizing Provider  naproxen (NAPROSYN) 500 MG tablet Take 1 tablet (500 mg total) by mouth 2 (two) times daily. Take with food 01/23/23  Yes Miken Stecher, PA-C  Accu-Chek Softclix Lancets lancets SMARTSIG:Topical 04/27/21   [provider]  aspirin EC 81 MG tablet Take 81 mg by mouth daily. Swallow whole.    [provider]  atorvastatin (LIPITOR) 10 MG tablet TAKE 1 TABLET(10 MG) BY MOUTH DAILY 09/26/21   Maeola Harman, MD  doxycycline (VIBRAMYCIN) 100 MG capsule Take 1 capsule (100 mg total) by mouth 2 (two) times daily. 09/29/22   Sherryll Burger, Pratik D, DO  esomeprazole (NEXIUM) 20 MG capsule Take 20 mg by mouth daily as needed (acid reflux).    [provider]  FEROSUL 325 (65 Fe) MG tablet Take 325 mg by mouth 2 (two) times daily. 04/27/21   [provider]  gabapentin (NEURONTIN) 300 MG capsule TAKE 1 CAPSULE(300 MG) BY MOUTH THREE TIMES DAILY 05/10/22   Vickki Hearing, MD  glipiZIDE (GLUCOTROL) 10 MG tablet Take 10 mg by mouth in the morning. 04/13/21   [provider]  HYDROcodone-acetaminophen  (NORCO/VICODIN) 5-325 MG tablet Take 1 tablet by mouth every 6 (six) hours as needed for moderate pain. 01/01/23   Vickki Hearing, MD  tiZANidine (ZANAFLEX) 4 MG tablet Take 1 tablet (4 mg total) by mouth every 6 (six) hours as needed for muscle spasms. 10/09/22   Vickki Hearing, MD  metFORMIN (GLUCOPHAGE) 500 MG tablet Take 1 tablet (500 mg total) by mouth 2 (two) times daily with a meal. 06/15/20 06/15/20  Elson Areas, PA-C      Allergies    Codeine, Metformin, Benadryl [diphenhydramine hcl], and Compazine    Review of Systems   Review of Systems  Constitutional:  Negative for chills and fever.  Gastrointestinal:  Negative for nausea and vomiting.  Musculoskeletal:  Positive for arthralgias. Negative for joint swelling, neck pain and neck stiffness.  Skin:  Negative for color change, rash and wound.  Neurological:  Negative for dizziness, weakness and headaches.    Physical Exam Updated Vital Signs BP (!) 144/82 (BP Location: Left Arm)   Pulse 97   Temp 97.8 F (36.6 C)   Resp 14   Ht 5\' 8"  (1.727 m)   Wt 99.8 kg   LMP 12/24/2022 (Approximate)   SpO2 96%   BMI 33.45 kg/m  Physical Exam Vitals and nursing note reviewed.  Constitutional:      General: She is not in acute distress.    Appearance: Normal appearance. She is not ill-appearing or  toxic-appearing.  Cardiovascular:     Rate and Rhythm: Normal rate and regular rhythm.     Pulses: Normal pulses.  Pulmonary:     Effort: Pulmonary effort is normal.  Musculoskeletal:        General: Tenderness and signs of injury present. No swelling or deformity.     Right shoulder: Tenderness present. No swelling, deformity or crepitus. Decreased range of motion. Normal strength. Normal pulse.     Cervical back: Normal range of motion. No tenderness.     Comments: Ttp of entire right shoulder.  Well healed surgical scar.  No bony deformity or step off.  No erythema or excessive warmth  Skin:    General: Skin is warm.      Capillary Refill: Capillary refill takes less than 2 seconds.     Findings: No bruising or erythema.  Neurological:     General: No focal deficit present.     Mental Status: She is alert.     Sensory: No sensory deficit.     Motor: No weakness.     ED Results / Procedures / Treatments   Labs (all labs ordered are listed, but only abnormal results are displayed) Labs Reviewed - No data to display  EKG None  Radiology DG Shoulder Right  Result Date: 01/23/2023 CLINICAL DATA:  Pain EXAM: RIGHT SHOULDER - 3 VIEW COMPARISON:  Shoulder x-ray 08/17/2020 FINDINGS: No fracture or dislocation. Preserved joint spaces. Small osteophytes along the humeral head. IMPRESSION: Small osteophytes along the glenohumeral joint. Electronically Signed   By: Karen Kays M.D.   On: 01/23/2023 13:15    Procedures Procedures    Medications Ordered in ED Medications  oxyCODONE-acetaminophen (PERCOCET/ROXICET) 5-325 MG per tablet 1 tablet (1 tablet Oral Given 01/23/23 1413)    ED Course/ Medical Decision Making/ A&P                             Medical Decision Making Pt here with hx of multiple right shoulder surgeries, here with pain to the shoulder after picking up groceries.  Felt a pop to the shoulder.  Pain radiating to right arm.  No neck pain, weakness or numbness of the affected extremity.    Poss dislocation, MSK, ligamentous injury, fx all considered.  Doubtful of fx or septic joint.    Amount and/or Complexity of Data Reviewed Radiology: ordered.    Details: Small osteophytes of the GH joint, w/o dislocation Discussion of management or test interpretation with external provider(s): Discussed XR findings.  Likely strain.  NV intact.  Pain addressed here.  Will try NSAID.  Pt has upcoming appt with orthopedics.  Doubt emergent process.    Risk Prescription drug management.           Final Clinical Impression(s) / ED Diagnoses Final diagnoses:  Acute pain of right shoulder     Rx / DC Orders ED Discharge Orders          Ordered    naproxen (NAPROSYN) 500 MG tablet  2 times daily        01/23/23 1404              Pauline Aus, PA-C 01/24/23 1535    Rondel Baton, MD 01/25/23 [phone]

## 2023-01-29 ENCOUNTER — Ambulatory Visit: Payer: Self-pay | Admitting: Orthopedic Surgery

## 2023-01-29 ENCOUNTER — Other Ambulatory Visit: Payer: Self-pay | Admitting: Orthopedic Surgery

## 2023-01-29 DIAGNOSIS — G8929 Other chronic pain: Secondary | ICD-10-CM

## 2023-01-29 DIAGNOSIS — M62838 Other muscle spasm: Secondary | ICD-10-CM

## 2023-02-07 ENCOUNTER — Ambulatory Visit: Payer: Self-pay | Admitting: Orthopedic Surgery

## 2023-02-21 ENCOUNTER — Emergency Department (HOSPITAL_COMMUNITY)
Admission: EM | Admit: 2023-02-21 | Discharge: 2023-02-22 | Disposition: A | Discharge status: Home / Self Care | Payer: Self-pay | Attending: Emergency Medicine | Admitting: Emergency Medicine

## 2023-02-21 ENCOUNTER — Other Ambulatory Visit: Payer: Self-pay

## 2023-02-21 ENCOUNTER — Encounter (HOSPITAL_COMMUNITY): Payer: Self-pay | Admitting: Emergency Medicine

## 2023-02-21 DIAGNOSIS — Z7982 Long term (current) use of aspirin: Secondary | ICD-10-CM | POA: Insufficient documentation

## 2023-02-21 DIAGNOSIS — G43001 Migraine without aura, not intractable, with status migrainosus: Secondary | ICD-10-CM | POA: Insufficient documentation

## 2023-02-21 LAB — CBC
HCT: 38.4 % (ref 36.0–46.0)
Hemoglobin: 12.4 g/dL (ref 12.0–15.0)
MCH: 28.7 pg (ref 26.0–34.0)
MCHC: 32.3 g/dL (ref 30.0–36.0)
MCV: 88.9 fL (ref 80.0–100.0)
Platelets: 347 10*3/uL (ref 150–400)
RBC: 4.32 MIL/uL (ref 3.87–5.11)
RDW: 13.9 % (ref 11.5–15.5)
WBC: 7.7 10*3/uL (ref 4.0–10.5)
nRBC: 0 % (ref 0.0–0.2)

## 2023-02-21 LAB — BASIC METABOLIC PANEL
Anion gap: 8 (ref 5–15)
BUN: 10 mg/dL (ref 6–20)
CO2: 19 mmol/L — ABNORMAL LOW (ref 22–32)
Calcium: 8.8 mg/dL — ABNORMAL LOW (ref 8.9–10.3)
Chloride: 107 mmol/L (ref 98–111)
Creatinine, Ser: 0.61 mg/dL (ref 0.44–1.00)
GFR, Estimated: >60 mL/min (ref >60)
Glucose, Bld: 245 mg/dL — ABNORMAL HIGH (ref 70–99)
Potassium: 3.8 mmol/L (ref 3.5–5.1)
Sodium: 134 mmol/L — ABNORMAL LOW (ref 135–145)

## 2023-02-21 MED ORDER — KETOROLAC TROMETHAMINE 30 MG/ML IJ SOLN
30.0000 mg | Freq: Once | INTRAMUSCULAR | Status: AC
  Administered 2023-02-22: 30 mg via INTRAVENOUS
  Filled 2023-02-21: qty 1

## 2023-02-21 MED ORDER — METOCLOPRAMIDE HCL 5 MG/ML IJ SOLN
10.0000 mg | Freq: Once | INTRAMUSCULAR | Status: AC
  Administered 2023-02-22: 10 mg via INTRAVENOUS
  Filled 2023-02-21: qty 2

## 2023-02-21 MED ORDER — SODIUM CHLORIDE 0.9 % IV BOLUS
1000.0000 mL | Freq: Once | INTRAVENOUS | Status: AC
  Administered 2023-02-22: 1000 mL via INTRAVENOUS

## 2023-02-21 NOTE — ED Triage Notes (Signed)
Pt complains of migraine x 3 days, n/v and dizziness started today. Pt unable to control migraine with medications at home.

## 2023-02-22 NOTE — ED Provider Notes (Signed)
EMERGENCY DEPARTMENT AT [facility] Provider Note   CSN: [id number] Arrival date & time: 02/21/23  2030     History  Chief Complaint  Patient presents with   Migraine   Dizziness    Tina Pham is a 47 y.o. female.  Patient presents to the emergency department for evaluation of migraine headache.  She reports a history of recurrent migraine in the past.  Migraines typically are pain behind the right eye associated with nausea, vomiting and light sensitivity.  She is experiencing all of the symptoms tonight.  She did feel dizzy, however, which is unusual so she presented for evaluation.       Home Medications Prior to Admission medications   Medication Sig Start Date End Date Taking? Authorizing Provider  Accu-Chek Softclix Lancets lancets SMARTSIG:Topical 04/27/21   [provider]  aspirin EC 81 MG tablet Take 81 mg by mouth daily. Swallow whole.    [provider]  atorvastatin (LIPITOR) 10 MG tablet TAKE 1 TABLET(10 MG) BY MOUTH DAILY 09/26/21   Maeola Harman, MD  doxycycline (VIBRAMYCIN) 100 MG capsule Take 1 capsule (100 mg total) by mouth 2 (two) times daily. 09/29/22   Sherryll Burger, Pratik D, DO  esomeprazole (NEXIUM) 20 MG capsule Take 20 mg by mouth daily as needed (acid reflux).    [provider]  FEROSUL 325 (65 Fe) MG tablet Take 325 mg by mouth 2 (two) times daily. 04/27/21   [provider]  gabapentin (NEURONTIN) 300 MG capsule TAKE 1 CAPSULE(300 MG) BY MOUTH THREE TIMES DAILY 05/10/22   Vickki Hearing, MD  glipiZIDE (GLUCOTROL) 10 MG tablet Take 10 mg by mouth in the morning. 04/13/21   [provider]  HYDROcodone-acetaminophen (NORCO/VICODIN) 5-325 MG tablet Take 1 tablet by mouth every 6 (six) hours as needed for moderate pain. 01/01/23   Vickki Hearing, MD  naproxen (NAPROSYN) 500 MG tablet Take 1 tablet (500 mg total) by mouth 2 (two) times daily. Take with food 01/23/23   Triplett,  Tammy, PA-C  tiZANidine (ZANAFLEX) 4 MG tablet Take 1 tablet (4 mg total) by mouth every 6 (six) hours as needed for muscle spasms. 10/09/22   Vickki Hearing, MD  metFORMIN (GLUCOPHAGE) 500 MG tablet Take 1 tablet (500 mg total) by mouth 2 (two) times daily with a meal. 06/15/20 06/15/20  Elson Areas, PA-C      Allergies    Codeine, Metformin, Benadryl [diphenhydramine hcl], and Compazine    Review of Systems   Review of Systems  Physical Exam Updated Vital Signs BP 131/71   Pulse 71   Temp 98 F (36.7 C) (Oral)   Resp 16   Ht 5\' 8"  (1.727 m)   Wt 99 kg   LMP 02/08/2023 (Approximate)   SpO2 99%   BMI 33.19 kg/m  Physical Exam Vitals and nursing note reviewed.  Constitutional:      General: She is not in acute distress.    Appearance: She is well-developed.  HENT:     Head: Normocephalic and atraumatic.     Mouth/Throat:     Mouth: Mucous membranes are moist.  Eyes:     General: Vision grossly intact. Gaze aligned appropriately.     Extraocular Movements: Extraocular movements intact.     Conjunctiva/sclera: Conjunctivae normal.  Cardiovascular:     Rate and Rhythm: Normal rate and regular rhythm.     Pulses: Normal pulses.     Heart sounds: Normal heart sounds,  S1 normal and S2 normal. No murmur heard.    No friction rub. No gallop.  Pulmonary:     Effort: Pulmonary effort is normal. No respiratory distress.     Breath sounds: Normal breath sounds.  Abdominal:     General: Bowel sounds are normal.     Palpations: Abdomen is soft.     Tenderness: There is no abdominal tenderness. There is no guarding or rebound.     Hernia: No hernia is present.  Musculoskeletal:        General: No swelling.     Cervical back: Full passive range of motion without pain, normal range of motion and neck supple. No spinous process tenderness or muscular tenderness. Normal range of motion.     Right lower leg: No edema.     Left lower leg: No edema.  Skin:    General: Skin is  warm and dry.     Capillary Refill: Capillary refill takes less than 2 seconds.     Findings: No ecchymosis, erythema, rash or wound.  Neurological:     General: No focal deficit present.     Mental Status: She is alert and oriented to person, place, and time.     GCS: GCS eye subscore is 4. GCS verbal subscore is 5. GCS motor subscore is 6.     Cranial Nerves: Cranial nerves 2-12 are intact.     Sensory: Sensation is intact.     Motor: Motor function is intact.     Coordination: Coordination is intact.  Psychiatric:        Attention and Perception: Attention normal.        Mood and Affect: Mood normal.        Speech: Speech normal.        Behavior: Behavior normal.     ED Results / Procedures / Treatments   Labs (all labs ordered are listed, but only abnormal results are displayed) Labs Reviewed  BASIC METABOLIC PANEL - Abnormal; Notable for the following components:      Result Value   Sodium 134 (*)    CO2 19 (*)    Glucose, Bld 245 (*)    Calcium 8.8 (*)    All other components within normal limits  CBG MONITORING, ED - Abnormal; Notable for the following components:   Glucose-Capillary 176 (*)    All other components within normal limits  CBC  URINALYSIS, ROUTINE W REFLEX MICROSCOPIC  POC URINE PREG, ED    EKG None  Radiology No results found.  Procedures Procedures    Medications Ordered in ED Medications  sodium chloride 0.9 % bolus 1,000 mL (1,000 mLs Intravenous New Bag/Given 02/22/23 0023)  ketorolac (TORADOL) 30 MG/ML injection 30 mg (30 mg Intravenous Given 02/22/23 0023)  metoCLOPramide (REGLAN) injection 10 mg (10 mg Intravenous Given 02/22/23 0023)    ED Course/ Medical Decision Making/ A&P                                 Medical Decision Making Amount and/or Complexity of Data Reviewed Labs: ordered.  Risk Prescription drug management.   Differential Diagnosis considered includes, but not limited to: Hypertensive emergencies, Idiopathic  intracranial hypertension, Space occupying lesions (tumors, abscesses, cysts), Acute hydrocephalus, Dural sinus thrombosis, Intracranial hemorrhage, Cerebrovascular accident or stroke, Meningitis and encephalitis, Migraine HA, Tension HA.  Patient presents to the emergency department for evaluation of migraine headache. Patient is currently experiencing a  headache that is similar to previous migraines. Patient does not have any unusual features compared to previous migraines. Patient has normal neurologic examination. There are no unusual features, such as unusual intensity or sudden onset. As this headache is similar to previous migraines, there is no concern for subarachnoid hemorrhage or other etiology. Patient therefore does not require imaging. Patient treated as migraine headache.  Has had resolution of her headache, no further dizziness.  She feels well enough for discharge, does not require any further workup.        Final Clinical Impression(s) / ED Diagnoses Final diagnoses:  Migraine without aura and with status migrainosus, not intractable    Rx / DC Orders ED Discharge Orders     None         Evani Shrider, Canary Brim, MD 02/22/23 [phone]

## 2023-04-09 ENCOUNTER — Other Ambulatory Visit: Payer: Self-pay

## 2023-04-09 ENCOUNTER — Emergency Department (HOSPITAL_COMMUNITY)
Admission: EM | Admit: 2023-04-09 | Discharge: 2023-04-09 | Disposition: A | Discharge status: Home / Self Care | Payer: Self-pay | Attending: Student | Admitting: Student

## 2023-04-09 ENCOUNTER — Emergency Department (HOSPITAL_COMMUNITY): Payer: Self-pay

## 2023-04-09 DIAGNOSIS — E119 Type 2 diabetes mellitus without complications: Secondary | ICD-10-CM | POA: Insufficient documentation

## 2023-04-09 DIAGNOSIS — S91101A Unspecified open wound of right great toe without damage to nail, initial encounter: Secondary | ICD-10-CM

## 2023-04-09 DIAGNOSIS — F17210 Nicotine dependence, cigarettes, uncomplicated: Secondary | ICD-10-CM | POA: Insufficient documentation

## 2023-04-09 DIAGNOSIS — Z7982 Long term (current) use of aspirin: Secondary | ICD-10-CM | POA: Insufficient documentation

## 2023-04-09 DIAGNOSIS — Z7984 Long term (current) use of oral hypoglycemic drugs: Secondary | ICD-10-CM | POA: Insufficient documentation

## 2023-04-09 DIAGNOSIS — U071 COVID-19: Secondary | ICD-10-CM | POA: Insufficient documentation

## 2023-04-09 DIAGNOSIS — I10 Essential (primary) hypertension: Secondary | ICD-10-CM | POA: Insufficient documentation

## 2023-04-09 LAB — SARS CORONAVIRUS 2 BY RT PCR: SARS Coronavirus 2 by RT PCR: POSITIVE — AB

## 2023-04-09 MED ORDER — DOXYCYCLINE HYCLATE 100 MG PO CAPS: 100.0000 mg | ORAL_CAPSULE | Freq: Two times a day (BID) | ORAL | 0 refills | Status: DC

## 2023-04-09 MED ORDER — BENZONATATE 100 MG PO CAPS
200.0000 mg | ORAL_CAPSULE | Freq: Once | ORAL | Status: AC
  Administered 2023-04-09: 200 mg via ORAL
  Filled 2023-04-09: qty 2

## 2023-04-09 MED ORDER — ONDANSETRON 4 MG PO TBDP
4.0000 mg | ORAL_TABLET | Freq: Once | ORAL | Status: AC
  Administered 2023-04-09: 4 mg via ORAL
  Filled 2023-04-09: qty 1

## 2023-04-09 MED ORDER — KETOROLAC TROMETHAMINE 15 MG/ML IJ SOLN
15.0000 mg | Freq: Once | INTRAMUSCULAR | Status: AC
  Administered 2023-04-09: 15 mg via INTRAMUSCULAR
  Filled 2023-04-09: qty 1

## 2023-04-09 MED ORDER — BENZONATATE 100 MG PO CAPS: 100.0000 mg | ORAL_CAPSULE | Freq: Three times a day (TID) | ORAL | 0 refills | Status: DC

## 2023-04-09 MED ORDER — ONDANSETRON 4 MG PO TBDP: 4.0000 mg | ORAL_TABLET | Freq: Three times a day (TID) | ORAL | 0 refills | Status: DC | PRN

## 2023-04-09 MED ORDER — DOXYCYCLINE HYCLATE 100 MG PO TABS: 100.0000 mg | ORAL_TABLET | Freq: Once | ORAL | Status: DC

## 2023-04-09 MED ORDER — NAPROXEN 375 MG PO TABS: 375.0000 mg | ORAL_TABLET | Freq: Two times a day (BID) | ORAL | 0 refills | Status: DC

## 2023-04-09 NOTE — ED Notes (Signed)
Dc instructions and scripts reviewed with pt no questions or concerns at this time.  

## 2023-04-09 NOTE — ED Triage Notes (Signed)
Pt presents with generalized body aches and headache since last Thursday.

## 2023-04-10 NOTE — ED Provider Notes (Signed)
[place] EMERGENCY DEPARTMENT AT [facility] Provider Note  CSN: [id number] Arrival date & time: 04/09/23 1418  Chief Complaint(s) Flu-Like Symptoms  HPI Tina Pham is a 47 y.o. female with PMH panic disorder, chronic back pain, migraines, hospital admission for diabetic foot wound in March 2024 who presents emergency department for multiple complaints including chills, fatigue, cough and toe pain.  Patient states that over the last 4 days she has had worsening flulike symptoms and over the last 1 week has had worsening redness and pain to the great toe on the right.  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  Denies shortness of breath, chest pain, abdominal pain, nausea, vomiting or other systemic symptoms.   Past Medical History Past Medical History:  Diagnosis Date   Anxiety    panic attacks   Arthritis    Chronic back pain    Chronic bronchitis (HCC)    Depression    Diabetes mellitus    GERD (gastroesophageal reflux disease)    prn tums   High cholesterol    History of kidney stones    passed ?   Hypertension    no longer on BP medications   Migraine    Neuropathic pain of foot    Plantar fasciitis    Pneumonia 2011ish   Sciatica    Torn rotator cuff    Patient Active Problem List   Diagnosis Date Noted   Open wound of right great toe 09/29/2022   Uncontrolled diabetes mellitus with hyperglycemia (HCC) 09/29/2022   GERD (gastroesophageal reflux disease) 09/29/2022   Essential hypertension 09/29/2022   Mixed hyperlipidemia 09/29/2022   Obesity (BMI 30-39.9) 09/29/2022   Peripheral neuropathy 09/29/2022   Cellulitis of great toe of right foot 09/28/2022   Arthrosis of right acromioclavicular joint    PAD (peripheral artery disease) (HCC) 07/13/2020   S/P right rotator cuff repair 10/04/2018   Complete tear of right rotator cuff    Chronic pain of left knee    Left shoulder pain 09/21/2015    Disorders of bursae and tendons in shoulder region, unspecified 09/25/2013   Home Medication(s) Prior to Admission medications   Medication Sig Start Date End Date Taking? Authorizing Provider  benzonatate (TESSALON) 100 MG capsule Take 1 capsule (100 mg total) by mouth every 8 (eight) hours. 04/09/23  Yes Tiffanie Blassingame, MD  doxycycline (VIBRAMYCIN) 100 MG capsule Take 1 capsule (100 mg total) by mouth 2 (two) times daily. 04/09/23  Yes Jamecia Lerman, MD  naproxen (NAPROSYN) 375 MG tablet Take 1 tablet (375 mg total) by mouth 2 (two) times daily. 04/09/23  Yes Anayia Eugene, MD  ondansetron (ZOFRAN-ODT) 4 MG disintegrating tablet Take 1 tablet (4 mg total) by mouth every 8 (eight) hours as needed for nausea or vomiting. 04/09/23  Yes Braxtyn Dorff, MD  Accu-Chek Softclix Lancets lancets SMARTSIG:Topical 04/27/21   [provider]  aspirin EC 81 MG tablet Take 81 mg by mouth daily. Swallow whole.    [provider]  atorvastatin (LIPITOR) 10 MG tablet TAKE 1 TABLET(10 MG) BY MOUTH DAILY 09/26/21   Maeola Harman, MD  esomeprazole (NEXIUM) 20 MG capsule Take 20 mg by mouth daily as needed (acid reflux).    [provider]  FEROSUL 325 (65 Fe) MG tablet Take 325 mg by mouth 2 (two) times daily. 04/27/21   [provider]  gabapentin (NEURONTIN) 300 MG capsule TAKE 1 CAPSULE(300 MG) BY MOUTH THREE  TIMES DAILY 05/10/22   Vickki Hearing, MD  glipiZIDE (GLUCOTROL) 10 MG tablet Take 10 mg by mouth in the morning. 04/13/21   [provider]  HYDROcodone-acetaminophen (NORCO/VICODIN) 5-325 MG tablet Take 1 tablet by mouth every 6 (six) hours as needed for moderate pain. 01/01/23   Vickki Hearing, MD  tiZANidine (ZANAFLEX) 4 MG tablet Take 1 tablet (4 mg total) by mouth every 6 (six) hours as needed for muscle spasms. 10/09/22   Vickki Hearing, MD  metFORMIN (GLUCOPHAGE) 500 MG tablet Take 1 tablet (500 mg total) by mouth 2 (two) times daily  with a meal. 06/15/20 06/15/20  Elson Areas, PA-C                                                                                                                                    Past Surgical History Past Surgical History:  Procedure Laterality Date   ABDOMINAL AORTOGRAM W/LOWER EXTREMITY N/A 07/01/2020   Procedure: ABDOMINAL AORTOGRAM W/LOWER EXTREMITY;  Surgeon: Cephus Shelling, MD;  Location: [facility];  Service: Cardiovascular;  Laterality: N/A;   AMPUTATION Left 07/22/2020   Procedure: LEFT GREAT TOE AMPUTATION;  Surgeon: Maeola Harman, MD;  Location: [facility] OR;  Service: Vascular;  Laterality: Left;   BACK SURGERY     herniated disc; had disc removed and then fusion; total of 3 surgeries   EYE SURGERY Bilateral    removal of cyst and straightening of muscles   KNEE ARTHROSCOPY WITH MEDIAL MENISECTOMY Left 12/27/2016   Procedure: LEFT KNEE DIAGNOSTIC ARTHROSCOPY;  Surgeon: Vickki Hearing, MD;  Location: [facility]S;  Service: Orthopedics;  Laterality: Left;   PERIPHERAL VASCULAR INTERVENTION Left 07/01/2020   Procedure: PERIPHERAL VASCULAR INTERVENTION;  Surgeon: Cephus Shelling, MD;  Location: [facility];  Service: Cardiovascular;  Laterality: Left;  common iliac   SHOULDER ARTHROSCOPY WITH DISTAL CLAVICLE RESECTION Right 05/13/2021   Procedure: ARTHROSCOPY RIGHT SHOULDER WITH DISTAL CLAVICLE EXCISION;  Surgeon: Vickki Hearing, MD;  Location: [facility];  Service: Orthopedics;  Laterality: Right;   SHOULDER OPEN ROTATOR CUFF REPAIR Right 09/19/2018   Procedure: ROTATOR CUFF REPAIR SHOULDER OPEN;  Surgeon: Vickki Hearing, MD;  Location: [facility];  Service: Orthopedics;  Laterality: Right;   SHOULDER OPEN ROTATOR CUFF REPAIR Right 08/05/2019   Procedure: ROTATOR CUFF REPAIR SHOULDER OPEN;  Surgeon: Vickki Hearing, MD;  Location: [facility];  Service: Orthopedics;  Laterality: Right;   SHOULDER OPEN ROTATOR CUFF REPAIR Right 05/13/2021   Procedure:  OPEN ROTATOR CUFF REPAIR RIGHT SHOULDER;  Surgeon: Vickki Hearing, MD;  Location: [facility];  Service: Orthopedics;  Laterality: Right;   TUBAL LIGATION     Family History Family History  Problem Relation Age of Onset   Heart failure Mother    COPD Mother    Diabetes Mother    Heart failure Father    Diabetes Other  Social History Social History   Tobacco Use   Smoking status: Every Day    Current packs/day: 1.00    Average packs/day: 1 pack/day for 31.0 years (31.0 ttl pk-yrs)    Types: Cigarettes   Smokeless tobacco: Never  Vaping Use   Vaping status: Never Used  Substance Use Topics   Alcohol use: No   Drug use: No   Allergies Codeine, Metformin, Benadryl [diphenhydramine hcl], and Compazine  Review of Systems Review of Systems  Constitutional:  Positive for chills and fatigue.  Respiratory:  Positive for cough.   Musculoskeletal:  Positive for arthralgias and myalgias.  Skin:  Positive for wound.    Physical Exam Vital Signs  I have reviewed the triage vital signs BP 120/70 (BP Location: Right Arm)   Pulse 88   Temp 98.6 F (37 C) (Oral)   Resp 18   Ht 5\' 8"  (1.727 m)   Wt 99.8 kg   LMP 04/02/2023 (Approximate)   SpO2 99%   BMI 33.45 kg/m   Physical Exam Vitals and nursing note reviewed.  Constitutional:      General: She is not in acute distress.    Appearance: She is well-developed.  HENT:     Head: Normocephalic and atraumatic.  Eyes:     Conjunctiva/sclera: Conjunctivae normal.  Cardiovascular:     Rate and Rhythm: Normal rate and regular rhythm.     Heart sounds: No murmur heard. Pulmonary:     Effort: Pulmonary effort is normal. No respiratory distress.     Breath sounds: Normal breath sounds.  Abdominal:     Palpations: Abdomen is soft.     Tenderness: There is no abdominal tenderness.  Musculoskeletal:        General: No swelling.     Cervical back: Neck supple.  Skin:    General: Skin is warm and dry.     Capillary Refill:  Capillary refill takes less than 2 seconds.     Findings: Lesion present.  Neurological:     Mental Status: She is alert.  Psychiatric:        Mood and Affect: Mood normal.     ED Results and Treatments Labs (all labs ordered are listed, but only abnormal results are displayed) Labs Reviewed  SARS CORONAVIRUS 2 BY RT PCR - Abnormal; Notable for the following components:      Result Value   SARS Coronavirus 2 by RT PCR POSITIVE (*)    All other components within normal limits                                                                                                                          Radiology DG Chest 2 View  Result Date: 04/09/2023 CLINICAL DATA:  Cough shortness of breath, and body aches for 4 days. EXAM: CHEST - 2 VIEW COMPARISON:  06/06/2018 FINDINGS: The heart size and mediastinal contours are within normal limits. Both lungs are clear. Mild thoracic spine degenerative disc disease  noted. IMPRESSION: No active cardiopulmonary disease. Electronically Signed   By: Danae Orleans M.D.   On: 04/09/2023 15:41    Pertinent labs & imaging results that were available during my care of the patient were reviewed by me and considered in my medical decision making (see MDM for details).  Medications Ordered in ED Medications  benzonatate (TESSALON) capsule 200 mg (200 mg Oral Given 04/09/23 1831)  ondansetron (ZOFRAN-ODT) disintegrating tablet 4 mg (4 mg Oral Given 04/09/23 1831)  ketorolac (TORADOL) 15 MG/ML injection 15 mg (15 mg Intramuscular Given 04/09/23 1831)                                                                                                                                     Procedures Procedures  (including critical care time)  Medical Decision Making / ED Course   This patient presents to the ED for concern of flulike symptoms, toe pain, this involves an extensive number of treatment options, and is a complaint that carries with it a high risk of  complications and morbidity.  The differential diagnosis includes COVID-19, influenza, RSV, unspecified viral illness, osteomyelitis, cellulitis, diabetic foot wound, fracture  MDM: Patient seen emergency room for evaluation of toe pain and flulike symptoms.  Physical exam with erythema and a small wound to the distal tip of the right great toe but is otherwise unremarkable.  Patient is COVID-positive which likely explains her flulike symptoms.  Patient treated symptomatically with Zofran, Toradol and Tessalon Perles.  Patient will be started on doxycycline for foot wound and outpatient podiatry consultation was placed.  The wound is relatively superficial and I have lower suspicion for osteomyelitis.  No crepitus on exam and no tenderness to palpation over the bony surface and I have lower suspicion for fracture.  At this time she does not meet inpatient criteria for admission and is safe for discharge with outpatient follow-up.  Patient has stable vital signs and I have very low suspicion for bacteremia.   Additional history obtained:  -External records from outside source obtained and reviewed including: Chart review including previous notes, labs, imaging, consultation notes   Lab Tests: -I ordered, reviewed, and interpreted labs.   The pertinent results include:   Labs Reviewed  SARS CORONAVIRUS 2 BY RT PCR - Abnormal; Notable for the following components:      Result Value   SARS Coronavirus 2 by RT PCR POSITIVE (*)    All other components within normal limits      Imaging Studies ordered: I ordered imaging studies including chest x-ray I independently visualized and interpreted imaging. I agree with the radiologist interpretation   Medicines ordered and prescription drug management: Meds ordered this encounter  Medications   benzonatate (TESSALON) capsule 200 mg   ondansetron (ZOFRAN-ODT) disintegrating tablet 4 mg   ketorolac (TORADOL) 15 MG/ML injection 15 mg   DISCONTD:  doxycycline (VIBRA-TABS) tablet 100 mg   benzonatate (  TESSALON) 100 MG capsule    Sig: Take 1 capsule (100 mg total) by mouth every 8 (eight) hours.    Dispense:  21 capsule    Refill:  0   doxycycline (VIBRAMYCIN) 100 MG capsule    Sig: Take 1 capsule (100 mg total) by mouth 2 (two) times daily.    Dispense:  20 capsule    Refill:  0   naproxen (NAPROSYN) 375 MG tablet    Sig: Take 1 tablet (375 mg total) by mouth 2 (two) times daily.    Dispense:  20 tablet    Refill:  0   ondansetron (ZOFRAN-ODT) 4 MG disintegrating tablet    Sig: Take 1 tablet (4 mg total) by mouth every 8 (eight) hours as needed for nausea or vomiting.    Dispense:  20 tablet    Refill:  0    -I have reviewed the patients home medicines and have made adjustments as needed  Critical interventions none    Cardiac Monitoring: The patient was maintained on a cardiac monitor.  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   Hypertension    no longer on BP medications   Migraine    Neuropathic pain of foot    Plantar fasciitis    Pneumonia 2011ish   Sciatica    Torn rotator cuff       Dispostion: I considered admission for this patient, but at this time she does not meet inpatient criteria for admission and she is safe for discharge with outpatient follow-up     Final Clinical Impression(s) / ED  Diagnoses Final diagnoses:  COVID     @PCDICTATION @    Glendora Score, MD 04/10/23 [id number]

## 2023-05-28 ENCOUNTER — Emergency Department (HOSPITAL_COMMUNITY)
Admission: EM | Admit: 2023-05-28 | Discharge: 2023-05-28 | Disposition: A | Discharge status: Home / Self Care | Payer: Self-pay | Attending: Emergency Medicine | Admitting: Emergency Medicine

## 2023-05-28 ENCOUNTER — Encounter (HOSPITAL_COMMUNITY): Payer: Self-pay

## 2023-05-28 ENCOUNTER — Other Ambulatory Visit: Payer: Self-pay

## 2023-05-28 DIAGNOSIS — Z7982 Long term (current) use of aspirin: Secondary | ICD-10-CM | POA: Insufficient documentation

## 2023-05-28 DIAGNOSIS — I10 Essential (primary) hypertension: Secondary | ICD-10-CM | POA: Insufficient documentation

## 2023-05-28 DIAGNOSIS — E119 Type 2 diabetes mellitus without complications: Secondary | ICD-10-CM | POA: Insufficient documentation

## 2023-05-28 DIAGNOSIS — M5412 Radiculopathy, cervical region: Secondary | ICD-10-CM | POA: Insufficient documentation

## 2023-05-28 DIAGNOSIS — M25511 Pain in right shoulder: Secondary | ICD-10-CM | POA: Insufficient documentation

## 2023-05-28 DIAGNOSIS — Z7984 Long term (current) use of oral hypoglycemic drugs: Secondary | ICD-10-CM | POA: Insufficient documentation

## 2023-05-28 DIAGNOSIS — Z79899 Other long term (current) drug therapy: Secondary | ICD-10-CM | POA: Insufficient documentation

## 2023-05-28 DIAGNOSIS — X58XXXA Exposure to other specified factors, initial encounter: Secondary | ICD-10-CM | POA: Insufficient documentation

## 2023-05-28 MED ORDER — KETOROLAC TROMETHAMINE 15 MG/ML IJ SOLN
15.0000 mg | Freq: Once | INTRAMUSCULAR | Status: AC
  Administered 2023-05-28: 15 mg via INTRAMUSCULAR

## 2023-05-28 MED ORDER — HYDROCODONE-ACETAMINOPHEN 5-325 MG PO TABS: 1.0000 | ORAL_TABLET | Freq: Four times a day (QID) | ORAL | 0 refills | Status: DC | PRN

## 2023-05-28 MED ORDER — METHOCARBAMOL 500 MG PO TABS
500.0000 mg | ORAL_TABLET | Freq: Once | ORAL | Status: AC
  Administered 2023-05-28: 500 mg via ORAL
  Filled 2023-05-28: qty 1

## 2023-05-28 MED ORDER — NAPROXEN 500 MG PO TABS: 500.0000 mg | ORAL_TABLET | Freq: Two times a day (BID) | ORAL | 0 refills | Status: DC

## 2023-05-28 MED ORDER — METHOCARBAMOL 500 MG PO TABS: 500.0000 mg | ORAL_TABLET | Freq: Four times a day (QID) | ORAL | 0 refills | Status: DC

## 2023-05-28 MED ORDER — LIDOCAINE 5 % EX PTCH
1.0000 | MEDICATED_PATCH | CUTANEOUS | Status: DC
  Administered 2023-05-28: 1 via TRANSDERMAL
  Filled 2023-05-28: qty 1

## 2023-05-28 MED ORDER — KETOROLAC TROMETHAMINE 15 MG/ML IJ SOLN
15.0000 mg | Freq: Once | INTRAMUSCULAR | Status: DC
  Filled 2023-05-28: qty 1

## 2023-05-28 NOTE — ED Provider Notes (Signed)
[place] EMERGENCY DEPARTMENT AT [facility] Provider Note   CSN: [id number] Arrival date & time: 05/28/23  1054     History  Chief Complaint  Patient presents with   Neck Injury    Tina Pham is a 47 y.o. female.  She has PMH of diabetes, high cholesterol, GERD, hypertension, obesity.  Presents to ER for right shoulder and neck pain.  Has been gradually getting worse the past several months.  She has had couple of ER visits and told it was likely a pinched nerve.  Denies injury or trauma, denies weakness in the arm.  No headache.  No numbness or tingling but the pain does radiate from the right lateral neck down to her fingers.  She states that unfortunately due to lack of insurance she is no trouble following up but is trying to get into orthopedics for follow-up.  She has appointment but is not until next week.   Neck Injury       Home Medications Prior to Admission medications   Medication Sig Start Date End Date Taking? Authorizing Provider  HYDROcodone-acetaminophen (NORCO) 5-325 MG tablet Take 1 tablet by mouth every 6 (six) hours as needed for moderate pain (pain score 4-6). 05/28/23  Yes Travin Marik A, PA-C  methocarbamol (ROBAXIN) 500 MG tablet Take 1 tablet (500 mg total) by mouth 4 (four) times daily. 05/28/23  Yes Thandiwe Siragusa A, PA-C  Accu-Chek Softclix Lancets lancets SMARTSIG:Topical 04/27/21   [provider]  aspirin EC 81 MG tablet Take 81 mg by mouth daily. Swallow whole.    [provider]  atorvastatin (LIPITOR) 10 MG tablet TAKE 1 TABLET(10 MG) BY MOUTH DAILY 09/26/21   Maeola Harman, MD  benzonatate (TESSALON) 100 MG capsule Take 1 capsule (100 mg total) by mouth every 8 (eight) hours. 04/09/23   Kommor, Madison, MD  doxycycline (VIBRAMYCIN) 100 MG capsule Take 1 capsule (100 mg total) by mouth 2 (two) times daily. 04/09/23   Kommor, Madison, MD  esomeprazole (NEXIUM) 20 MG capsule Take 20 mg by mouth daily  as needed (acid reflux).    [provider]  FEROSUL 325 (65 Fe) MG tablet Take 325 mg by mouth 2 (two) times daily. 04/27/21   [provider]  gabapentin (NEURONTIN) 300 MG capsule TAKE 1 CAPSULE(300 MG) BY MOUTH THREE TIMES DAILY 05/10/22   Vickki Hearing, MD  glipiZIDE (GLUCOTROL) 10 MG tablet Take 10 mg by mouth in the morning. 04/13/21   [provider]  naproxen (NAPROSYN) 500 MG tablet Take 1 tablet (500 mg total) by mouth 2 (two) times daily. 05/28/23   Carmel Sacramento A, PA-C  ondansetron (ZOFRAN-ODT) 4 MG disintegrating tablet Take 1 tablet (4 mg total) by mouth every 8 (eight) hours as needed for nausea or vomiting. 04/09/23   Kommor, Madison, MD  tiZANidine (ZANAFLEX) 4 MG tablet Take 1 tablet (4 mg total) by mouth every 6 (six) hours as needed for muscle spasms. 10/09/22   Vickki Hearing, MD  metFORMIN (GLUCOPHAGE) 500 MG tablet Take 1 tablet (500 mg total) by mouth 2 (two) times daily with a meal. 06/15/20 06/15/20  Elson Areas, PA-C      Allergies    Codeine, Metformin, Benadryl [diphenhydramine hcl], and Compazine    Review of Systems   Review of Systems  Physical Exam Updated Vital Signs BP (!) 173/89   Pulse 90   Temp 98.3 F (36.8 C) (Oral)   Resp 17   Ht  5\' 8"  (1.727 m)   Wt 99.8 kg   SpO2 99%   BMI 33.45 kg/m  Physical Exam Vitals and nursing note reviewed.  Constitutional:      General: She is not in acute distress.    Appearance: She is well-developed.  HENT:     Head: Normocephalic and atraumatic.     Mouth/Throat:     Mouth: Mucous membranes are moist.  Eyes:     Extraocular Movements: Extraocular movements intact.     Conjunctiva/sclera: Conjunctivae normal.  Neck:     Comments: Tenderness right lateral paraspinous muscles and trapezius Cardiovascular:     Rate and Rhythm: Normal rate and regular rhythm.     Heart sounds: No murmur heard. Pulmonary:     Effort: Pulmonary effort is normal. No respiratory  distress.     Breath sounds: Normal breath sounds.  Abdominal:     Palpations: Abdomen is soft.     Tenderness: There is no abdominal tenderness.  Musculoskeletal:        General: No swelling.     Cervical back: Neck supple.  Skin:    General: Skin is warm and dry.     Capillary Refill: Capillary refill takes less than 2 seconds.  Neurological:     General: No focal deficit present.     Mental Status: She is alert and oriented to person, place, and time.     Comments: Strength 5 out of 5 in bilateral upper and lower extremities, no decrease sensation to light touch in bilateral upper lower extremities  Psychiatric:        Mood and Affect: Mood normal.        Behavior: Behavior normal.     ED Results / Procedures / Treatments   Labs (all labs ordered are listed, but only abnormal results are displayed) Labs Reviewed - No data to display  EKG None  Radiology No results found.  Procedures Procedures    Medications Ordered in ED Medications  lidocaine (LIDODERM) 5 % 1 patch (1 patch Transdermal Patch Applied 05/28/23 1203)  methocarbamol (ROBAXIN) tablet 500 mg (500 mg Oral Given 05/28/23 1202)  ketorolac (TORADOL) 15 MG/ML injection 15 mg (15 mg Intramuscular Given 05/28/23 1204)    ED Course/ Medical Decision Making/ A&P                                 Medical Decision Making This patient presents to the ED for concern of right neck and arm pain, this involves an extensive number of treatment options, and is a complaint that carries with it a high risk of complications and morbidity.  The differential diagnosis includes radiculopathy, muscle strain, fracture, muscle spasm, contusion, other   Co morbidities that complicate the patient evaluation :   Diabetes   Additional history obtained:  Additional history obtained from EMR External records from outside source obtained and reviewed including notes, labs, imaging     Problem List / ED Course / Critical  interventions / Medication management  Right shoulder neck pain-patient has known cervical radiculopathy, has followed up with orthopedics in the past, was had prior epidural steroid injections.  Unfortunately has been lost to follow-up and is trying to reestablish care due to worsening pain over the past couple of months.  No injury or trauma.  No signs symptoms of cord compression she has normal strength in her extremities and lower extremities bilaterally.  She has some relief  with muscle laxer's and NSAIDs in the ED.  Given her history of poorly controlled diabetes we will avoid steroids at this time.  Also initial blood pressure was simply elevated.  I rechecked this and it has improved and she is not having symptoms.  She is not currently on antihypertensives.  Some of her blood pressure elevation could be due to discomfort today but encouraged to follow-up closely with her primary care doctor for blood pressure recheck especially given her comorbidities and she is agreeable.  Orthopedic follow-up already scheduled. I ordered medication including Toradol and Robaxin and Lidoderm for right neck and shoulder pain Reevaluation of the patient after these medicines showed that the patient improved I have reviewed the patients home medicines and have made adjustments as needed      Risk Prescription drug management.           Final Clinical Impression(s) / ED Diagnoses Final diagnoses:  Cervical radiculopathy    Rx / DC Orders ED Discharge Orders          Ordered    methocarbamol (ROBAXIN) 500 MG tablet  4 times daily        05/28/23 1244    HYDROcodone-acetaminophen (NORCO) 5-325 MG tablet  Every 6 hours PRN        05/28/23 1244    naproxen (NAPROSYN) 500 MG tablet  2 times daily        05/28/23 [address], PA-C 05/28/23 1250    Cathren Laine, MD 05/31/23 1540

## 2023-05-28 NOTE — Discharge Instructions (Addendum)
It was a pleasure taking care of you today.  You were seen in the ED for continued right sided neck pain.  This is likely due to pinched nerve in your neck as you have had before.  We are giving you anti-inflammatories (naproxen) muscle relaxers (robaxin) and a small of hydrocodone which can be used for more severe pain but not when you are working or driving.  If you have weakness, fever, or any other new or worsening symptoms.  As we discussed you need to follow-up with your primary care doctor for a blood pressure recheck.  It was initially very elevated but improved after we controlled your pain but is still high.  Given your diabetes and high cholesterol is important to make sure you follow-up in the next 1 to 2 days for recheck of your blood pressure with your primary care doctor to see if they want to prescribe you blood pressure medication.  You develop chest pain, shortness of breath, worse headache of your life, numbness ting or weakness or other symptoms should come back to the ER right away.

## 2023-05-28 NOTE — ED Triage Notes (Signed)
Pt to er, pt states that she has a pinched nerve in her neck, states that she called MD Romeo Apple and can't get in until next week, states that the pain is  bad so she came to the er.

## 2023-06-04 ENCOUNTER — Telehealth: Payer: Self-pay

## 2023-06-04 ENCOUNTER — Ambulatory Visit (INDEPENDENT_AMBULATORY_CARE_PROVIDER_SITE_OTHER): Payer: Self-pay | Admitting: Orthopedic Surgery

## 2023-06-04 DIAGNOSIS — Z9889 Other specified postprocedural states: Secondary | ICD-10-CM

## 2023-06-04 DIAGNOSIS — M25511 Pain in right shoulder: Secondary | ICD-10-CM

## 2023-06-04 DIAGNOSIS — M5412 Radiculopathy, cervical region: Secondary | ICD-10-CM

## 2023-06-04 MED ORDER — HYDROCODONE-ACETAMINOPHEN 5-325 MG PO TABS
1.0000 | ORAL_TABLET | Freq: Four times a day (QID) | ORAL | 0 refills | Status: DC | PRN
Start: 2023-06-04 — End: 2023-06-11

## 2023-06-04 MED ORDER — METHYLPREDNISOLONE ACETATE 40 MG/ML IJ SUSP
40.0000 mg | Freq: Once | INTRAMUSCULAR | Status: AC
  Administered 2023-06-04: 40 mg via INTRA_ARTICULAR

## 2023-06-04 NOTE — Addendum Note (Signed)
Addended by: Fuller Canada E on: 06/04/2023 12:08 PM   Modules accepted: Level of Service

## 2023-06-04 NOTE — Telephone Encounter (Signed)
Patient came back in to see if Dr. Romeo Apple had did her prescription. She has to go to work in [place] and wanted to get it before she left [place]  PATIENT USES [facility] ON [address]

## 2023-06-04 NOTE — Progress Notes (Addendum)
Chief Complaint  Patient presents with   Shoulder Pain    Patient having righrt shoulder pain for 2 weeks now no injures to report    47 year old female presented to the ER with right arm radicular pain thought to have cervical radiculopathy but on our evaluation she says she lifted her right shoulder felt a pop and now she cannot lift her arm she has had 3 surgeries on the right arm  She has a history of clot diabetes.  A clot was in her leg she had to have stents placed  She has peripheral artery disease she was a chronic smoker  She developed some opioid dependence as well  On examination today right shoulder  She abducts to 90 actively passively she has 110 degrees.  She exhibited pain related weakness.  I did a subacromial injection I put her on some pain medicine for a couple of days to try to get everything to, settle down I will see her in a couple of weeks if she is not better we can repeat her MRI with contrast but my overall opinion is that she has the cervical radiculopathy from the cervical disc disease which needed surgery but was never done  Meds ordered this encounter  Medications   methylPREDNISolone acetate (DEPO-MEDROL) injection 40 mg   HYDROcodone-acetaminophen (NORCO) 5-325 MG tablet    Sig: Take 1 tablet by mouth every 6 (six) hours as needed for moderate pain (pain score 4-6).    Dispense:  30 tablet    Refill:  0    Procedure note the subacromial injection shoulder RIGHT    Verbal consent was obtained to inject the  RIGHT   Shoulder  Timeout was completed to confirm the injection site is a subacromial space of the  RIGHT  shoulder   Medication used Depo-Medrol 40 mg and lidocaine 1% 3 cc  Anesthesia was provided by ethyl chloride  The injection was performed in the RIGHT  posterior subacromial space. After pinning the skin with alcohol and anesthetized the skin with ethyl chloride the subacromial space was injected using a 20-gauge needle. There were  no complications  Sterile dressing was applied.   Unfortunately now she is not insured

## 2023-06-05 NOTE — Telephone Encounter (Signed)
Was sent yesterday.

## 2023-06-11 ENCOUNTER — Other Ambulatory Visit: Payer: Self-pay | Admitting: Orthopedic Surgery

## 2023-06-11 DIAGNOSIS — G8929 Other chronic pain: Secondary | ICD-10-CM

## 2023-06-11 DIAGNOSIS — M62838 Other muscle spasm: Secondary | ICD-10-CM

## 2023-06-11 DIAGNOSIS — M25511 Pain in right shoulder: Secondary | ICD-10-CM

## 2023-06-11 DIAGNOSIS — Z9889 Other specified postprocedural states: Secondary | ICD-10-CM

## 2023-06-11 MED ORDER — TIZANIDINE HCL 4 MG PO TABS
4.0000 mg | ORAL_TABLET | Freq: Four times a day (QID) | ORAL | 0 refills | Status: DC | PRN
Start: 2023-06-11 — End: 2023-07-02

## 2023-06-11 MED ORDER — HYDROCODONE-ACETAMINOPHEN 5-325 MG PO TABS: 1.0000 | ORAL_TABLET | Freq: Four times a day (QID) | ORAL | 0 refills | Status: DC | PRN

## 2023-06-18 ENCOUNTER — Other Ambulatory Visit: Payer: Self-pay | Admitting: Orthopedic Surgery

## 2023-06-18 DIAGNOSIS — Z9889 Other specified postprocedural states: Secondary | ICD-10-CM

## 2023-06-18 DIAGNOSIS — M25511 Pain in right shoulder: Secondary | ICD-10-CM

## 2023-06-18 MED ORDER — HYDROCODONE-ACETAMINOPHEN 5-325 MG PO TABS: 1.0000 | ORAL_TABLET | Freq: Four times a day (QID) | ORAL | 0 refills | Status: DC | PRN

## 2023-06-25 ENCOUNTER — Other Ambulatory Visit: Payer: Self-pay | Admitting: Orthopedic Surgery

## 2023-06-25 DIAGNOSIS — M25511 Pain in right shoulder: Secondary | ICD-10-CM

## 2023-06-25 DIAGNOSIS — Z9889 Other specified postprocedural states: Secondary | ICD-10-CM

## 2023-06-26 ENCOUNTER — Encounter: Payer: Self-pay | Admitting: Orthopedic Surgery

## 2023-06-27 ENCOUNTER — Other Ambulatory Visit: Payer: Self-pay | Admitting: Orthopedic Surgery

## 2023-06-27 DIAGNOSIS — Z9889 Other specified postprocedural states: Secondary | ICD-10-CM

## 2023-06-27 DIAGNOSIS — M25511 Pain in right shoulder: Secondary | ICD-10-CM

## 2023-06-27 MED ORDER — HYDROCODONE-ACETAMINOPHEN 5-325 MG PO TABS: 1.0000 | ORAL_TABLET | Freq: Four times a day (QID) | ORAL | 0 refills | Status: DC | PRN

## 2023-07-02 ENCOUNTER — Other Ambulatory Visit: Payer: Self-pay | Admitting: Orthopedic Surgery

## 2023-07-02 ENCOUNTER — Ambulatory Visit: Payer: Self-pay | Admitting: Orthopedic Surgery

## 2023-07-02 DIAGNOSIS — G8929 Other chronic pain: Secondary | ICD-10-CM

## 2023-07-02 DIAGNOSIS — Z9889 Other specified postprocedural states: Secondary | ICD-10-CM

## 2023-07-02 DIAGNOSIS — M62838 Other muscle spasm: Secondary | ICD-10-CM

## 2023-07-02 DIAGNOSIS — M25511 Pain in right shoulder: Secondary | ICD-10-CM

## 2023-07-04 MED ORDER — HYDROCODONE-ACETAMINOPHEN 5-325 MG PO TABS: 1.0000 | ORAL_TABLET | Freq: Four times a day (QID) | ORAL | 0 refills | Status: DC | PRN

## 2023-07-04 MED ORDER — TIZANIDINE HCL 4 MG PO TABS: 4.0000 mg | ORAL_TABLET | Freq: Four times a day (QID) | ORAL | 0 refills | Status: DC | PRN

## 2023-07-10 ENCOUNTER — Other Ambulatory Visit: Payer: Self-pay | Admitting: Orthopedic Surgery

## 2023-07-10 DIAGNOSIS — Z9889 Other specified postprocedural states: Secondary | ICD-10-CM

## 2023-07-10 DIAGNOSIS — M25511 Pain in right shoulder: Secondary | ICD-10-CM

## 2023-07-12 ENCOUNTER — Other Ambulatory Visit: Payer: Self-pay | Admitting: Orthopedic Surgery

## 2023-07-12 DIAGNOSIS — Z9889 Other specified postprocedural states: Secondary | ICD-10-CM

## 2023-07-12 DIAGNOSIS — M25511 Pain in right shoulder: Secondary | ICD-10-CM

## 2023-07-16 ENCOUNTER — Ambulatory Visit: Payer: Self-pay | Admitting: Orthopedic Surgery

## 2023-08-06 ENCOUNTER — Emergency Department (HOSPITAL_COMMUNITY)
Admission: EM | Admit: 2023-08-06 | Discharge: 2023-08-06 | Disposition: A | Discharge status: Home / Self Care | Payer: Self-pay | Attending: Emergency Medicine | Admitting: Emergency Medicine

## 2023-08-06 ENCOUNTER — Encounter (HOSPITAL_COMMUNITY): Payer: Self-pay | Admitting: Emergency Medicine

## 2023-08-06 ENCOUNTER — Emergency Department (HOSPITAL_COMMUNITY): Payer: Self-pay

## 2023-08-06 ENCOUNTER — Other Ambulatory Visit: Payer: Self-pay

## 2023-08-06 DIAGNOSIS — E1165 Type 2 diabetes mellitus with hyperglycemia: Secondary | ICD-10-CM | POA: Insufficient documentation

## 2023-08-06 DIAGNOSIS — Z7982 Long term (current) use of aspirin: Secondary | ICD-10-CM | POA: Insufficient documentation

## 2023-08-06 DIAGNOSIS — M79675 Pain in left toe(s): Secondary | ICD-10-CM | POA: Insufficient documentation

## 2023-08-06 DIAGNOSIS — R7402 Elevation of levels of lactic acid dehydrogenase (LDH): Secondary | ICD-10-CM | POA: Insufficient documentation

## 2023-08-06 DIAGNOSIS — Z7984 Long term (current) use of oral hypoglycemic drugs: Secondary | ICD-10-CM | POA: Insufficient documentation

## 2023-08-06 DIAGNOSIS — Z955 Presence of coronary angioplasty implant and graft: Secondary | ICD-10-CM | POA: Insufficient documentation

## 2023-08-06 DIAGNOSIS — Z4800 Encounter for change or removal of nonsurgical wound dressing: Secondary | ICD-10-CM | POA: Insufficient documentation

## 2023-08-06 LAB — COMPREHENSIVE METABOLIC PANEL
ALT: 11 U/L (ref 0–44)
AST: 14 U/L — ABNORMAL LOW (ref 15–41)
Albumin: 3.9 g/dL (ref 3.5–5.0)
Alkaline Phosphatase: 54 U/L (ref 38–126)
Anion gap: 9 (ref 5–15)
BUN: 9 mg/dL (ref 6–20)
CO2: 23 mmol/L (ref 22–32)
Calcium: 9.4 mg/dL (ref 8.9–10.3)
Chloride: 102 mmol/L (ref 98–111)
Creatinine, Ser: 0.49 mg/dL (ref 0.44–1.00)
GFR, Estimated: >60 mL/min (ref >60)
Glucose, Bld: 318 mg/dL — ABNORMAL HIGH (ref 70–99)
Potassium: 3.6 mmol/L (ref 3.5–5.1)
Sodium: 134 mmol/L — ABNORMAL LOW (ref 135–145)
Total Bilirubin: 0.5 mg/dL (ref 0.0–1.2)
Total Protein: 7.3 g/dL (ref 6.5–8.1)

## 2023-08-06 LAB — LACTIC ACID, PLASMA
Lactic Acid, Venous: 2.5 mmol/L (ref 0.5–1.9)
Lactic Acid, Venous: 2.2 mmol/L (ref 0.5–1.9)

## 2023-08-06 LAB — CBC WITH DIFFERENTIAL/PLATELET
Abs Immature Granulocytes: 0.03 10*3/uL (ref 0.00–0.07)
Basophils Absolute: 0.1 10*3/uL (ref 0.0–0.1)
Basophils Relative: 1 %
Eosinophils Absolute: 0.1 10*3/uL (ref 0.0–0.5)
Eosinophils Relative: 2 %
HCT: 40.5 % (ref 36.0–46.0)
Hemoglobin: 12.2 g/dL (ref 12.0–15.0)
Immature Granulocytes: 0 %
Lymphocytes Relative: 31 %
Lymphs Abs: 2.1 10*3/uL (ref 0.7–4.0)
MCH: 24.5 pg — ABNORMAL LOW (ref 26.0–34.0)
MCHC: 30.1 g/dL (ref 30.0–36.0)
MCV: 81.5 fL (ref 80.0–100.0)
Monocytes Absolute: 0.3 10*3/uL (ref 0.1–1.0)
Monocytes Relative: 5 %
Neutro Abs: 4.3 10*3/uL (ref 1.7–7.7)
Neutrophils Relative %: 61 %
Platelets: 392 10*3/uL (ref 150–400)
RBC: 4.97 MIL/uL (ref 3.87–5.11)
RDW: 17.5 % — ABNORMAL HIGH (ref 11.5–15.5)
WBC: 6.9 10*3/uL (ref 4.0–10.5)
nRBC: 0 % (ref 0.0–0.2)

## 2023-08-06 LAB — CBG MONITORING, ED: Glucose-Capillary: 295 mg/dL — ABNORMAL HIGH (ref 70–99)

## 2023-08-06 MED ORDER — DOXYCYCLINE HYCLATE 100 MG PO CAPS: 100.0000 mg | ORAL_CAPSULE | Freq: Two times a day (BID) | ORAL | 0 refills | Status: DC

## 2023-08-06 MED ORDER — SODIUM CHLORIDE 0.9 % IV BOLUS
1000.0000 mL | Freq: Once | INTRAVENOUS | Status: AC
  Administered 2023-08-06: 1000 mL via INTRAVENOUS

## 2023-08-06 MED ORDER — OXYCODONE-ACETAMINOPHEN 5-325 MG PO TABS
1.0000 | ORAL_TABLET | Freq: Once | ORAL | Status: AC
  Administered 2023-08-06: 1 via ORAL
  Filled 2023-08-06: qty 1

## 2023-08-06 NOTE — Discharge Instructions (Signed)
 It is unclear whether you are developing a burgeoning infection, versus having intermittent ischemia, of your foot.  I have spoke to the vascular surgeon, he wants you to follow-up in office with him.  I have also referred you to a podiatrist, please call the number above, to make an appointment.  I prescribed you an antibiotic in case you are developing a foot infection.  Please take it as prescribed

## 2023-08-06 NOTE — ED Provider Notes (Signed)
 [place] EMERGENCY DEPARTMENT AT [facility] Provider Note   CSN: [id number] Arrival date & time: 08/06/23  1240     History  Chief Complaint  Patient presents with   Wound Check    Tina Pham is a 48 y.o. female, hx of DMII, PAD s/p stents, who presents to the ED secondary to second left toe pain.  She states that a couple weeks ago, she started having some changes in color of her left second toenail, he started getting darker in nature, and progressively she has been unable to bend her toe.  She also feels like there is a knot under the ball of her second toe, and it feels throbbing, and numb at the same time.  She notes that when she was in the waiting room, her toe turned a little dusky looking, but now that it is propped up, it is normal appearing.  She states elevating helps the pain.  She notes that she has tried Tylenol , Aleve , and Norco without any relief.  She denies any changes in the warmth of her foot.  Denies any trauma.  No fevers, chills, or wounds to the foot    Home Medications Prior to Admission medications   Medication Sig Start Date End Date Taking? Authorizing Provider  aspirin  EC 81 MG tablet Take 81 mg by mouth daily. Swallow whole.   Yes [provider]  atorvastatin  (LIPITOR) 10 MG tablet TAKE 1 TABLET(10 MG) BY MOUTH DAILY Patient taking differently: Take 10 mg by mouth daily. 09/26/21  Yes Sheree Penne Bruckner, MD  doxycycline  (VIBRAMYCIN ) 100 MG capsule Take 1 capsule (100 mg total) by mouth 2 (two) times daily. 08/06/23  Yes Toriano Aikey L, PA  esomeprazole (NEXIUM) 20 MG capsule Take 20 mg by mouth daily as needed (acid reflux).   Yes [provider]  FEROSUL 325 (65 Fe) MG tablet Take 325 mg by mouth 2 (two) times daily. 04/27/21  Yes [provider]  gabapentin  (NEURONTIN ) 300 MG capsule TAKE 1 CAPSULE(300 MG) BY MOUTH THREE TIMES DAILY 05/10/22  Yes Margrette Taft BRAVO, MD  glipiZIDE  (GLUCOTROL ) 10 MG  tablet Take 10 mg by mouth in the morning. 04/13/21  Yes [provider]  Accu-Chek Softclix Lancets lancets SMARTSIG:Topical 04/27/21   [provider]  metFORMIN  (GLUCOPHAGE ) 500 MG tablet Take 1 tablet (500 mg total) by mouth 2 (two) times daily with a meal. 06/15/20 06/15/20  Flint Sonny POUR, PA-C      Allergies    Codeine, Metformin , Benadryl [diphenhydramine hcl], and Compazine    Review of Systems   Review of Systems  Skin:  Negative for wound.    Physical Exam Updated Vital Signs BP (!) 149/81 (BP Location: Right Arm)   Pulse 83   Temp 98.8 F (37.1 C) (Oral)   Resp 16   Ht 5' 8 (1.727 m)   Wt 99.8 kg   SpO2 97%   BMI 33.45 kg/m  Physical Exam Vitals and nursing note reviewed.  Constitutional:      General: She is not in acute distress.    Appearance: She is well-developed.  HENT:     Head: Normocephalic and atraumatic.  Eyes:     Conjunctiva/sclera: Conjunctivae normal.  Cardiovascular:     Rate and Rhythm: Normal rate and regular rhythm.     Heart sounds: No murmur heard. Pulmonary:     Effort: Pulmonary effort is normal. No respiratory distress.     Breath sounds: Normal breath sounds.  Abdominal:     Palpations: Abdomen is soft.     Tenderness: There is no abdominal tenderness.  Musculoskeletal:        General: No swelling.     Cervical back: Neck supple.  Skin:    General: Skin is warm and dry.     Capillary Refill: Capillary refill takes less than 2 seconds.     Comments: TTP of head of 2nd-4th metatarsal. TTP of proximal phalanx of 2nd toe. +capillary refill and +dorsalis pedis pulse. +mild erythema of tip of toe  Neurological:     Mental Status: She is alert.  Psychiatric:        Mood and Affect: Mood normal.        ED Results / Procedures / Treatments   Labs (all labs ordered are listed, but only abnormal results are displayed) Labs Reviewed  LACTIC ACID, PLASMA - Abnormal; Notable for the following components:       Result Value   Lactic Acid, Venous 2.5 (*)    All other components within normal limits  COMPREHENSIVE METABOLIC PANEL - Abnormal; Notable for the following components:   Sodium 134 (*)    Glucose, Bld 318 (*)    AST 14 (*)    All other components within normal limits  CBC WITH DIFFERENTIAL/PLATELET - Abnormal; Notable for the following components:   MCH 24.5 (*)    RDW 17.5 (*)    All other components within normal limits  LACTIC ACID, PLASMA - Abnormal; Notable for the following components:   Lactic Acid, Venous 2.2 (*)    All other components within normal limits  CBG MONITORING, ED - Abnormal; Notable for the following components:   Glucose-Capillary 295 (*)    All other components within normal limits    EKG None  Radiology DG Foot Complete Left Result Date: 08/06/2023 CLINICAL DATA:  Painful knot underneath the second toe. EXAM: LEFT FOOT - COMPLETE 3+ VIEW COMPARISON:  Left great toe x-rays dated June 15, 2020. Left foot x-rays dated May 25, 2009. FINDINGS: Prior amputation of the great toe. No acute fracture or dislocation. Joint spaces are preserved. Bone mineralization is normal. Soft tissues are unremarkable. IMPRESSION: 1. No acute osseous abnormality. Electronically Signed   By: Elsie ONEIDA Shoulder M.D.   On: 08/06/2023 15:05    Procedures Procedures    Medications Ordered in ED Medications  sodium chloride  0.9 % bolus 1,000 mL (0 mLs Intravenous Stopped 08/06/23 1904)  oxyCODONE -acetaminophen  (PERCOCET/ROXICET) 5-325 MG per tablet 1 tablet (1 tablet Oral Given 08/06/23 1832)    ED Course/ Medical Decision Making/ A&P                                 Medical Decision Making Patient is a 48 year old female, here with second toe pain, this been going on for the last few weeks, she states she feels like she has had a change of color of her nail.  States it feels darker.  On exam, her nail is thickened, and yellow she has a good pulse, and she has no swelling of  the foot.  Lactic acid was ordered by triage, as well as blood work.  X-ray of the foot was ordered.  She has mild erythema around the toenail, but no other kind of wounds or findings.  See picture for further details.  Amount and/or Complexity of Data Reviewed Labs: ordered.    Details: Mildly elevated lactic acid  of 2.5 and 2.2, no leukocytosis, other lab within normal limits Radiology: ordered.    Details: X-ray unremarkable Discussion of management or test interpretation with external provider(s): Patient has mild erythema around her toe, but x-ray is unremarkable.  Will give her some doxycycline  for possible developing cellulitis, however she is not presenting like osteomyelitis at this time for me.  I spoke to vascular surgery, upon the recommendation of Dr. Yolande, to discuss possibly ruling out any kind of intermittent ischemia.  I spoke with Dr. Gretta, he recommends follow-up in clinic as patient has a warm foot, with good pulses, and good capillary refill.  I believe the changes in her toenail are likely secondary to a foot fungus, and encouraged to follow-up with the PCP for this.  She is overall well-appearing, in agreement with this.  Provided her with information to follow-up with a podiatrist as well.  She has no tachycardia, blood pressure findings that are concerning, or febrile status  Risk Prescription drug management.    Final Clinical Impression(s) / ED Diagnoses Final diagnoses:  Pain of toe of left foot    Rx / DC Orders ED Discharge Orders          Ordered    doxycycline  (VIBRAMYCIN ) 100 MG capsule  2 times daily        08/06/23 [address] [place], [place] 08/06/23 1944    Yolande Lamar BROCKS, MD 08/08/23 1547

## 2023-08-06 NOTE — ED Notes (Signed)
 Reviewed D/C information with the patient, pt verbalized understanding. No additional concerns at this time.

## 2023-08-06 NOTE — ED Triage Notes (Signed)
 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 Pt noticed a knot under left 2nd toe a few days ago and that is painful. Pt diabetic but does not check blood sugars at home.

## 2023-08-07 ENCOUNTER — Telehealth: Payer: Self-pay | Admitting: Vascular Surgery

## 2023-08-17 NOTE — Telephone Encounter (Signed)
Pt appts scheduled

## 2023-08-27 ENCOUNTER — Other Ambulatory Visit: Payer: Self-pay

## 2023-08-27 DIAGNOSIS — I739 Peripheral vascular disease, unspecified: Secondary | ICD-10-CM

## 2023-08-29 NOTE — Progress Notes (Signed)
   Ht 5' 8 (1.727 m)   Wt 204 lb (92.5 kg)   BMI 31.02 kg/m   Body mass index is 31.02 kg/m.  Chief Complaint  Patient presents with   Shoulder Pain    Pain persists Right / also has left shoulder pain wants to know if its from her neck     No diagnosis found.  DOI/DOS/ Date: long duration of right shoulder pain/ neck pain  Unchanged

## 2023-08-30 ENCOUNTER — Encounter: Payer: Self-pay | Admitting: Orthopedic Surgery

## 2023-08-30 ENCOUNTER — Ambulatory Visit (INDEPENDENT_AMBULATORY_CARE_PROVIDER_SITE_OTHER): Payer: Self-pay | Admitting: Orthopedic Surgery

## 2023-08-30 VITALS — BP 176/116 | HR 96 | Ht 68.0 in | Wt 204.0 lb

## 2023-08-30 DIAGNOSIS — M542 Cervicalgia: Secondary | ICD-10-CM

## 2023-08-30 DIAGNOSIS — M792 Neuralgia and neuritis, unspecified: Secondary | ICD-10-CM

## 2023-08-30 DIAGNOSIS — M5412 Radiculopathy, cervical region: Secondary | ICD-10-CM

## 2023-08-30 DIAGNOSIS — M25512 Pain in left shoulder: Secondary | ICD-10-CM

## 2023-08-30 MED ORDER — HYDROCODONE-ACETAMINOPHEN 5-325 MG PO TABS: 1.0000 | ORAL_TABLET | Freq: Four times a day (QID) | ORAL | 0 refills | Status: DC | PRN

## 2023-08-30 MED ORDER — TIZANIDINE HCL 4 MG PO TABS: 4.0000 mg | ORAL_TABLET | Freq: Four times a day (QID) | ORAL | 0 refills | Status: DC | PRN

## 2023-08-30 NOTE — Progress Notes (Signed)
 Patient: Tina Pham           Date of Birth: 11/05/1975           MRN: [id number] Visit Date: 08/30/2023 Requested by: Vicky Berg, MD [address] [place],  [place] [address] PCP: Vicky Berg, MD   Chief Complaint  Patient presents with   Shoulder Pain    Pain persists Right / also has left shoulder pain wants to know if its from her neck    Encounter Diagnoses  Name Primary?   Radiculopathy, cervical region Yes   Neck pain    Radicular pain of right upper extremity    Left shoulder pain, unspecified chronicity     Plan:    Chief Complaint  Patient presents with   Shoulder Pain    Pain persists Right / also has left shoulder pain wants to know if its from her neck     48 year old female status post 3 shoulder surgeries now complains of left shoulder pain and neck pain.  She had MRI which showed cervical disc disease with foraminal stenosis bilaterally.  There was questionable need for surgery.  She has lost her insurance.  She is currently off of all her anticoagulants although she is having some issues with her stents possibly.  This will be reevaluated.  There is not much we can do for her neck problems at this time.  She is not a candidate for repeat right shoulder surgery and I do not think it is necessary    Body mass index is 31.02 kg/m.   Problem list, medical hx, medications and allergies reviewed   ROS neck pain left shoulder pain   Allergies  Allergen Reactions   Codeine Other (See Comments)    Upset Stomach   Metformin  Other (See Comments)    Will not take per MD advisement    Benadryl [Diphenhydramine Hcl] Palpitations   Compazine Palpitations    BP (!) 176/116   Pulse 96   Ht 5' 8 (1.727 m)   Wt 204 lb (92.5 kg)   BMI 31.02 kg/m     Data reviewed: MRI C SPINE   CLINICAL DATA:  Chronic neck pain. Loss of muscle control in the right arm for 2-3 months. No acute injury or prior relevant surgery.   EXAM: MRI  CERVICAL SPINE WITHOUT CONTRAST   TECHNIQUE: Multiplanar, multisequence MR imaging of the cervical spine was performed. No intravenous contrast was administered.   COMPARISON:  Radiographs 03/24/2019.  CT cervical spine 02/22/2012   FINDINGS: Alignment: Near anatomic. No significant listhesis or focal angulation.   Vertebrae: No acute or suspicious osseous findings.   Cord: Normal in signal and caliber.   Posterior Fossa, vertebral arteries, paraspinal tissues: Visualized portions of the posterior fossa appear unremarkable. No significant paraspinal findings. Bilateral vertebral artery flow voids.   Disc levels:   C2-3: Normal interspace.   C3-4: Mild bilateral uncinate spurring and left-sided facet hypertrophy. Mild foraminal narrowing bilaterally. No cord deformity.   C4-5: Mild bilateral uncinate spurring and mild right foraminal narrowing. No cord deformity.   C5-6: Medial right foraminal disc osteophyte complex causes at least moderate right foraminal narrowing and probable right C6 nerve root encroachment. The left foramen is patent. No cord deformity.   C6-7: Spondylosis with disc bulging, posterior osteophytes and mild uncinate spurring bilaterally. No cord deformity or significant foraminal compromise.   C7-T1: Normal interspace.   IMPRESSION: 1. Medial right foraminal disc osteophyte complex at C5-6 causes at least  moderate right foraminal narrowing and probable right C6 nerve root encroachment. 2. Spondylosis at C6-7 without spinal stenosis or nerve root encroachment. 3. Mild foraminal narrowing bilaterally at C3-4 and on the right at C4-5. 4. No cord deformity or abnormal cord signal.     Electronically Signed   By: Elsie Perone M.D.   On: 02/15/2020 13:16     Assessment and plan:  Encounter Diagnoses  Name Primary?   Radiculopathy, cervical region Yes   Neck pain    Radicular pain of right upper extremity    Left shoulder pain,  unspecified chronicity     Neck pain b/l sh pain   I refilled 2 of her medications  I am the only source of medical care regarding her neck and shoulder that she can have right now because of the insurance issue although I encouraged her to go back to Dr. Barbarann or Dr. Georgina to consider neck surgery again   Meds ordered this encounter  Medications   HYDROcodone -acetaminophen  (NORCO/VICODIN) 5-325 MG tablet    Sig: Take 1 tablet by mouth every 6 (six) hours as needed for moderate pain (pain score 4-6).    Dispense:  30 tablet    Refill:  0   tiZANidine  (ZANAFLEX ) 4 MG tablet    Sig: Take 1 tablet (4 mg total) by mouth every 6 (six) hours as needed for muscle spasms.    Dispense:  30 tablet    Refill:  0    Procedures:   none

## 2023-08-30 NOTE — Patient Instructions (Signed)
 Call Dr Murrel Arnt office for follow up [phone]

## 2023-09-05 ENCOUNTER — Other Ambulatory Visit: Payer: Self-pay | Admitting: Orthopedic Surgery

## 2023-09-05 DIAGNOSIS — M542 Cervicalgia: Secondary | ICD-10-CM

## 2023-09-05 DIAGNOSIS — M792 Neuralgia and neuritis, unspecified: Secondary | ICD-10-CM

## 2023-09-05 DIAGNOSIS — M25512 Pain in left shoulder: Secondary | ICD-10-CM

## 2023-09-05 DIAGNOSIS — M5412 Radiculopathy, cervical region: Secondary | ICD-10-CM

## 2023-09-10 ENCOUNTER — Ambulatory Visit (HOSPITAL_COMMUNITY)
Admission: RE | Admit: 2023-09-10 | Discharge: 2023-09-10 | Disposition: A | Discharge status: Home / Self Care | Payer: Self-pay | Source: Ambulatory Visit | Attending: Surgery | Admitting: Surgery

## 2023-09-10 ENCOUNTER — Ambulatory Visit (INDEPENDENT_AMBULATORY_CARE_PROVIDER_SITE_OTHER)
Admission: RE | Admit: 2023-09-10 | Discharge: 2023-09-10 | Disposition: A | Discharge status: Home / Self Care | Payer: Self-pay | Source: Ambulatory Visit | Attending: Surgery | Admitting: Surgery

## 2023-09-10 DIAGNOSIS — I739 Peripheral vascular disease, unspecified: Secondary | ICD-10-CM | POA: Insufficient documentation

## 2023-09-10 LAB — VAS US ABI WITH/WO TBI
Left ABI: 0.93
Right ABI: 0.99

## 2023-09-10 NOTE — Progress Notes (Unsigned)
Patient name: Tina Pham MRN: [id number] DOB: 1975-12-28 Sex: female  REASON FOR CONSULT: Follow-up for iliac artery stenting  HPI: Tina Pham is a 48 y.o. female, with history of diabetes, hypertension, hyperlipidemia the presents for follow-up of her iliac artery stenting.  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  Later on 07/22/2020 underwent a left great toe amputation.  Has been followed by Dr. Arbie Cookey who has since retired.  Noted to have some higher velocity in the left common iliac stent.  Past Medical History:  Diagnosis Date   Anxiety    panic attacks   Arthritis    Chronic back pain    Chronic bronchitis (HCC)    Depression    Diabetes mellitus    GERD (gastroesophageal reflux disease)    prn tums   High cholesterol    History of kidney stones    passed ?   Hypertension    no longer on BP medications   Migraine    Neuropathic pain of foot    Plantar fasciitis    Pneumonia 2011ish   Sciatica    Torn rotator cuff     Past Surgical History:  Procedure Laterality Date   ABDOMINAL AORTOGRAM W/LOWER EXTREMITY N/A 07/01/2020   Procedure: ABDOMINAL AORTOGRAM W/LOWER EXTREMITY;  Surgeon: Cephus Shelling, MD;  Location: [facility];  Service: Cardiovascular;  Laterality: N/A;   AMPUTATION Left 07/22/2020   Procedure: LEFT GREAT TOE AMPUTATION;  Surgeon: Maeola Harman, MD;  Location: [facility] OR;  Service: Vascular;  Laterality: Left;   BACK SURGERY     herniated disc; had disc removed and then fusion; total of 3 surgeries   EYE SURGERY Bilateral    removal of cyst and straightening of muscles   KNEE ARTHROSCOPY WITH MEDIAL MENISECTOMY Left 12/27/2016   Procedure: LEFT KNEE DIAGNOSTIC ARTHROSCOPY;  Surgeon: Vickki Hearing, MD;  Location: [facility] OR[facility];  Service: Orthopedics;  Laterality: Left;   PERIPHERAL VASCULAR INTERVENTION  Left 07/01/2020   Procedure: PERIPHERAL VASCULAR INTERVENTION;  Surgeon: Cephus Shelling, MD;  Location: [facility];  Service: Cardiovascular;  Laterality: Left;  common iliac   SHOULDER ARTHROSCOPY WITH DISTAL CLAVICLE RESECTION Right 05/13/2021   Procedure: ARTHROSCOPY RIGHT SHOULDER WITH DISTAL CLAVICLE EXCISION;  Surgeon: Vickki Hearing, MD;  Location: [facility];  Service: Orthopedics;  Laterality: Right;   SHOULDER OPEN ROTATOR CUFF REPAIR Right 09/19/2018   Procedure: ROTATOR CUFF REPAIR SHOULDER OPEN;  Surgeon: Vickki Hearing, MD;  Location: [facility];  Service: Orthopedics;  Laterality: Right;   SHOULDER OPEN ROTATOR CUFF REPAIR Right 08/05/2019   Procedure: ROTATOR CUFF REPAIR SHOULDER OPEN;  Surgeon: Vickki Hearing, MD;  Location: [facility];  Service: Orthopedics;  Laterality: Right;   SHOULDER OPEN ROTATOR CUFF REPAIR Right 05/13/2021   Procedure: OPEN ROTATOR CUFF REPAIR RIGHT SHOULDER;  Surgeon: Vickki Hearing, MD;  Location: [facility];  Service: Orthopedics;  Laterality: Right;   TUBAL LIGATION      Family History  Problem Relation Age of Onset   Heart failure Mother    COPD Mother    Diabetes Mother    Heart failure Father    Diabetes Other     SOCIAL HISTORY: Social History   Socioeconomic History   Marital status: Legally Separated    Spouse name: Not on file   Number of children: Not on  file   Years of education: Not on file   Highest education level: Not on file  Occupational History   Not on file  Tobacco Use   Smoking status: Every Day    Current packs/day: 1.00    Average packs/day: 1 pack/day for 31.0 years (31.0 ttl pk-yrs)    Types: Cigarettes   Smokeless tobacco: Never  Vaping Use   Vaping status: Never Used  Substance and Sexual Activity   Alcohol use: No   Drug use: No   Sexual activity: Yes    Birth control/protection: Surgical  Other Topics Concern   Not on file  Social History Narrative   Not on file   Social Drivers  of Health   Financial Resource Strain: Not on file  Food Insecurity: No Food Insecurity (09/29/2022)   Hunger Vital Sign    Worried About Running Out of Food in the Last Year: Never true    Ran Out of Food in the Last Year: Never true  Transportation Needs: No Transportation Needs (09/29/2022)   PRAPARE - Administrator, Civil Service (Medical): No    Lack of Transportation (Non-Medical): No  Physical Activity: Not on file  Stress: Not on file  Social Connections: Not on file  Intimate Partner Violence: Not At Risk (09/29/2022)   Humiliation, Afraid, Rape, and Kick questionnaire    Fear of Current or Ex-Partner: No    Emotionally Abused: No    Physically Abused: No    Sexually Abused: No    Allergies  Allergen Reactions   Codeine Other (See Comments)    Upset Stomach   Metformin Other (See Comments)    Will not take per MD advisement    Benadryl [Diphenhydramine Hcl] Palpitations   Compazine Palpitations    Current Outpatient Medications  Medication Sig Dispense Refill   Accu-Chek Softclix Lancets lancets SMARTSIG:Topical     aspirin EC 81 MG tablet Take 81 mg by mouth daily. Swallow whole.     atorvastatin (LIPITOR) 10 MG tablet TAKE 1 TABLET(10 MG) BY MOUTH DAILY (Patient taking differently: Take 10 mg by mouth daily.) 30 tablet 11   esomeprazole (NEXIUM) 20 MG capsule Take 20 mg by mouth daily as needed (acid reflux).     FEROSUL 325 (65 Fe) MG tablet Take 325 mg by mouth 2 (two) times daily.     gabapentin (NEURONTIN) 300 MG capsule TAKE 1 CAPSULE(300 MG) BY MOUTH THREE TIMES DAILY 90 capsule 5   glipiZIDE (GLUCOTROL) 10 MG tablet Take 10 mg by mouth in the morning.     HYDROcodone-acetaminophen (NORCO/VICODIN) 5-325 MG tablet Take 1 tablet by mouth every 6 (six) hours as needed for moderate pain (pain score 4-6). 30 tablet 0   tiZANidine (ZANAFLEX) 4 MG tablet Take 1 tablet (4 mg total) by mouth every 6 (six) hours as needed for muscle spasms. 30 tablet 0   No  current facility-administered medications for this visit.    REVIEW OF SYSTEMS:  [X]  denotes positive finding, [ ]  denotes negative finding Cardiac  Comments:  Chest pain or chest pressure: ***   Shortness of breath upon exertion:    Short of breath when lying flat:    Irregular heart rhythm:        Vascular    Pain in calf, thigh, or hip brought on by ambulation:    Pain in feet at night that wakes you up from your sleep:     Blood clot in your veins:  Leg swelling:         Pulmonary    Oxygen at home:    Productive cough:     Wheezing:         Neurologic    Sudden weakness in arms or legs:     Sudden numbness in arms or legs:     Sudden onset of difficulty speaking or slurred speech:    Temporary loss of vision in one eye:     Problems with dizziness:         Gastrointestinal    Blood in stool:     Vomited blood:         Genitourinary    Burning when urinating:     Blood in urine:        Psychiatric    Major depression:         Hematologic    Bleeding problems:    Problems with blood clotting too easily:        Skin    Rashes or ulcers:        Constitutional    Fever or chills:      PHYSICAL EXAM: There were no vitals filed for this visit.  GENERAL: The patient is a well-nourished female, in no acute distress. The vital signs are documented above. CARDIAC: There is a regular rate and rhythm.  VASCULAR: *** PULMONARY: There is good air exchange bilaterally without wheezing or rales. ABDOMEN: Soft and non-tender with normal pitched bowel sounds.  MUSCULOSKELETAL: There are no major deformities or cyanosis. NEUROLOGIC: No focal weakness or paresthesias are detected. SKIN: There are no ulcers or rashes noted. PSYCHIATRIC: The patient has a normal affect.  DATA:   ***  Assessment/Plan:  48 y.o. female, with history of diabetes, hypertension, hyperlipidemia the presents for follow-up of her iliac artery stenting.  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  Later on 07/22/2020 underwent a left great toe amputation.    Cephus Shelling, MD [facility] Office: [phone]

## 2023-09-11 ENCOUNTER — Ambulatory Visit (INDEPENDENT_AMBULATORY_CARE_PROVIDER_SITE_OTHER): Payer: Self-pay | Admitting: Vascular Surgery

## 2023-09-11 ENCOUNTER — Encounter: Payer: Self-pay | Admitting: Vascular Surgery

## 2023-09-11 VITALS — BP 161/78 | HR 86 | Temp 97.0°F | Resp 20 | Ht 68.0 in | Wt 209.4 lb

## 2023-09-11 DIAGNOSIS — I70222 Atherosclerosis of native arteries of extremities with rest pain, left leg: Secondary | ICD-10-CM | POA: Insufficient documentation

## 2023-11-23 ENCOUNTER — Encounter: Payer: Self-pay | Admitting: Radiology

## 2023-12-24 ENCOUNTER — Encounter (HOSPITAL_COMMUNITY): Payer: Self-pay | Admitting: *Deleted

## 2023-12-24 ENCOUNTER — Emergency Department (HOSPITAL_COMMUNITY)
Admission: EM | Admit: 2023-12-24 | Discharge: 2023-12-24 | Disposition: A | Discharge status: Home / Self Care | Payer: Self-pay | Attending: Emergency Medicine | Admitting: Emergency Medicine

## 2023-12-24 ENCOUNTER — Emergency Department (HOSPITAL_COMMUNITY): Payer: Self-pay

## 2023-12-24 ENCOUNTER — Other Ambulatory Visit: Payer: Self-pay

## 2023-12-24 DIAGNOSIS — J209 Acute bronchitis, unspecified: Secondary | ICD-10-CM | POA: Insufficient documentation

## 2023-12-24 DIAGNOSIS — R519 Headache, unspecified: Secondary | ICD-10-CM

## 2023-12-24 DIAGNOSIS — Z7982 Long term (current) use of aspirin: Secondary | ICD-10-CM | POA: Insufficient documentation

## 2023-12-24 LAB — RESP PANEL BY RT-PCR (RSV, FLU A&B, COVID)  RVPGX2
Influenza A by PCR: NEGATIVE
Influenza B by PCR: NEGATIVE
Resp Syncytial Virus by PCR: NEGATIVE
SARS Coronavirus 2 by RT PCR: NEGATIVE

## 2023-12-24 LAB — PREGNANCY, URINE: Preg Test, Ur: NEGATIVE

## 2023-12-24 MED ORDER — METHYLPREDNISOLONE SODIUM SUCC 125 MG IJ SOLR
125.0000 mg | Freq: Once | INTRAMUSCULAR | Status: AC
  Administered 2023-12-24: 125 mg via INTRAVENOUS
  Filled 2023-12-24: qty 2

## 2023-12-24 MED ORDER — SODIUM CHLORIDE 0.9 % IV BOLUS
1000.0000 mL | Freq: Once | INTRAVENOUS | Status: AC
  Administered 2023-12-24: 1000 mL via INTRAVENOUS

## 2023-12-24 MED ORDER — KETOROLAC TROMETHAMINE 30 MG/ML IJ SOLN
15.0000 mg | Freq: Once | INTRAMUSCULAR | Status: AC
  Administered 2023-12-24: 15 mg via INTRAVENOUS
  Filled 2023-12-24: qty 1

## 2023-12-24 MED ORDER — METOCLOPRAMIDE HCL 5 MG/ML IJ SOLN
10.0000 mg | Freq: Once | INTRAMUSCULAR | Status: AC
  Administered 2023-12-24: 10 mg via INTRAVENOUS
  Filled 2023-12-24: qty 2

## 2023-12-24 MED ORDER — SODIUM CHLORIDE 0.9 % IV SOLN
500.0000 mg | Freq: Once | INTRAVENOUS | Status: AC
  Administered 2023-12-24: 500 mg via INTRAVENOUS
  Filled 2023-12-24: qty 5

## 2023-12-24 MED ORDER — DROPERIDOL 2.5 MG/ML IJ SOLN
0.6250 mg | Freq: Once | INTRAMUSCULAR | Status: AC
  Administered 2023-12-24: 0.625 mg via INTRAVENOUS
  Filled 2023-12-24: qty 2

## 2023-12-24 MED ORDER — AZITHROMYCIN 250 MG PO TABS: 250.0000 mg | ORAL_TABLET | Freq: Every day | ORAL | 0 refills | Status: AC

## 2023-12-24 MED ORDER — PREDNISONE 20 MG PO TABS
40.0000 mg | ORAL_TABLET | Freq: Every day | ORAL | 0 refills | Status: DC
Start: 2023-12-24 — End: 2024-06-06

## 2023-12-24 NOTE — ED Triage Notes (Signed)
 Pt c/o cough for weeks, HA and congestion since yesterday.  + chills

## 2023-12-24 NOTE — ED Provider Notes (Signed)
 Tina Pham EMERGENCY DEPARTMENT AT [facility] Provider Note   CSN: [id number] Arrival date & time: 12/24/23  1141     History  No chief complaint on file.   Tina Pham is a 48 y.o. female.  HPI Everyday smoker presents with cough, congestion, headache. Cough and congestion have been present for possibly greater than 1 week, headache, nausea new over the past day or so.  She does have a history of migraines, notes that her symptoms improved in spite of multiple OTC medication attempts.  No chest pain, though there is congestion in cough discomfort.  No syncope, no fever.    Home Medications Prior to Admission medications   Medication Sig Start Date End Date Taking? Authorizing Provider  azithromycin  (ZITHROMAX ) 250 MG tablet Take 1 tablet (250 mg total) by mouth daily for 4 days. Take 1 every day until finished. 12/24/23 12/28/23 Yes Dorenda Gandy, MD  predniSONE  (DELTASONE ) 20 MG tablet Take 2 tablets (40 mg total) by mouth daily with breakfast. For the next four days 12/24/23  Yes Dorenda Gandy, MD  Accu-Chek Softclix Lancets lancets SMARTSIG:Topical 04/27/21   [provider]  aspirin  EC 81 MG tablet Take 81 mg by mouth daily. Swallow whole.    [provider]  atorvastatin  (LIPITOR) 10 MG tablet TAKE 1 TABLET(10 MG) BY MOUTH DAILY Patient taking differently: Take 10 mg by mouth daily. 09/26/21   Adine Hoof, MD  esomeprazole (NEXIUM) 20 MG capsule Take 20 mg by mouth daily as needed (acid reflux).    [provider]  FEROSUL 325 (65 Fe) MG tablet Take 325 mg by mouth 2 (two) times daily. 04/27/21   [provider]  gabapentin  (NEURONTIN ) 300 MG capsule TAKE 1 CAPSULE(300 MG) BY MOUTH THREE TIMES DAILY 05/10/22   Darrin Emerald, MD  glipiZIDE  (GLUCOTROL ) 10 MG tablet Take 10 mg by mouth in the morning. 04/13/21   [provider]  HYDROcodone -acetaminophen  (NORCO/VICODIN) 5-325 MG tablet Take 1 tablet by  mouth every 6 (six) hours as needed for moderate pain (pain score 4-6). 08/30/23   Darrin Emerald, MD  tiZANidine  (ZANAFLEX ) 4 MG tablet Take 1 tablet (4 mg total) by mouth every 6 (six) hours as needed for muscle spasms. 08/30/23   Darrin Emerald, MD  metFORMIN  (GLUCOPHAGE ) 500 MG tablet Take 1 tablet (500 mg total) by mouth 2 (two) times daily with a meal. 06/15/20 06/15/20  Sandi Crosby, PA-C      Allergies    Codeine, Metformin , Benadryl [diphenhydramine hcl], and Compazine    Review of Systems   Review of Systems  Physical Exam Updated Vital Signs BP (!) 152/84   Pulse 75   Temp 98.1 F (36.7 C) (Oral)   Resp 18   Ht 5\' 8"  (1.727 m)   Wt 99.8 kg   LMP 12/17/2023 (Approximate)   SpO2 99%   BMI 33.45 kg/m  Physical Exam Vitals and nursing note reviewed.  Constitutional:      General: She is not in acute distress.    Appearance: She is well-developed.  HENT:     Head: Normocephalic and atraumatic.  Eyes:     Conjunctiva/sclera: Conjunctivae normal.  Cardiovascular:     Rate and Rhythm: Normal rate and regular rhythm.  Pulmonary:     Effort: Pulmonary effort is normal. No respiratory distress.     Breath sounds: Normal breath sounds. No stridor.  Abdominal:     General: There is no distension.  Skin:  General: Skin is warm and dry.  Neurological:     Mental Status: She is alert and oriented to person, place, and time.     Cranial Nerves: No cranial nerve deficit.  Psychiatric:        Mood and Affect: Mood normal.     ED Results / Procedures / Treatments   Labs (all labs ordered are listed, but only abnormal results are displayed) Labs Reviewed  RESP PANEL BY RT-PCR (RSV, FLU A&B, COVID)  RVPGX2  PREGNANCY, URINE    EKG None  Radiology DG Chest 2 View Result Date: 12/24/2023 CLINICAL DATA:  Cough, smoker EXAM: CHEST - 2 VIEW COMPARISON:  April 09, 2023 FINDINGS: The heart size and mediastinal contours are within normal limits. Both lungs  are clear. The visualized skeletal structures are unremarkable. IMPRESSION: No active cardiopulmonary disease. COPD changes hyperinflation both lung bases Electronically Signed   By: Fredrich Jefferson M.D.   On: 12/24/2023 15:46    Procedures Procedures    Medications Ordered in ED Medications  sodium chloride  0.9 % bolus 1,000 mL (0 mLs Intravenous Stopped 12/24/23 1703)  metoCLOPramide  (REGLAN ) injection 10 mg (10 mg Intravenous Given 12/24/23 1409)  ketorolac  (TORADOL ) 30 MG/ML injection 15 mg (15 mg Intravenous Given 12/24/23 1408)  methylPREDNISolone  sodium succinate (SOLU-MEDROL ) 125 mg/2 mL injection 125 mg (125 mg Intravenous Given 12/24/23 1655)  azithromycin  (ZITHROMAX ) 500 mg in sodium chloride  0.9 % 250 mL IVPB (0 mg Intravenous Stopped 12/24/23 1800)  droperidol (INAPSINE) 2.5 MG/ML injection 0.625 mg (0.625 mg Intravenous Given 12/24/23 1653)    ED Course/ Medical Decision Making/ A&P                                 Medical Decision Making Adult female with history of migraines, current everyday smoker presents with cough, congestion as well as headache, nausea.  Symptoms likely secondary to respiratory infection exacerbating migraine condition given her preserved neurostatus, hemodynamics are unremarkable she is afebrile, following commands appropriately.  Concern for pneumonia versus viral syndrome. COVID flu negative here.  Pulse ox 100% room air normal x-ray ordered, medications ordered.  Amount and/or Complexity of Data Reviewed External Data Reviewed: notes. Labs: ordered. Decision-making details documented in ED Course. Radiology: ordered and independent interpretation performed. Decision-making details documented in ED Course.  Risk Prescription drug management. Diagnosis or treatment significantly limited by social determinants of health.   Update: X-ray reviewed, no pneumonia, extremities hemodynamically unremarkable though she continues to have headache. Suspicion for  bronchitis given patient's, cough, congestion, headache, smoking history.  No evidence of bacteremia, sepsis, no neurodeficits indicating need for head CT currently. Patient will complete antibiotics, steroids here is appropriate for outpatient follow-up.        Final Clinical Impression(s) / ED Diagnoses Final diagnoses:  Acute bronchitis, unspecified organism  Bad headache    Rx / DC Orders ED Discharge Orders          Ordered    predniSONE  (DELTASONE ) 20 MG tablet  Daily with breakfast        12/24/23 1819    azithromycin  (ZITHROMAX ) 250 MG tablet  Daily        12/24/23 1819              Dorenda Gandy, MD 12/24/23 2054

## 2023-12-24 NOTE — Discharge Instructions (Signed)
Be sure to follow-up with your physician.  Return here for concerning changes in your condition. 

## 2024-01-19 ENCOUNTER — Emergency Department (HOSPITAL_COMMUNITY): Payer: Self-pay

## 2024-01-19 ENCOUNTER — Emergency Department (HOSPITAL_COMMUNITY)
Admission: EM | Admit: 2024-01-19 | Discharge: 2024-01-19 | Disposition: A | Discharge status: Home / Self Care | Payer: Self-pay | Attending: Emergency Medicine | Admitting: Emergency Medicine

## 2024-01-19 ENCOUNTER — Encounter (HOSPITAL_COMMUNITY): Payer: Self-pay

## 2024-01-19 ENCOUNTER — Other Ambulatory Visit: Payer: Self-pay

## 2024-01-19 DIAGNOSIS — E119 Type 2 diabetes mellitus without complications: Secondary | ICD-10-CM | POA: Insufficient documentation

## 2024-01-19 DIAGNOSIS — Z7984 Long term (current) use of oral hypoglycemic drugs: Secondary | ICD-10-CM | POA: Insufficient documentation

## 2024-01-19 DIAGNOSIS — R0789 Other chest pain: Secondary | ICD-10-CM | POA: Insufficient documentation

## 2024-01-19 DIAGNOSIS — I10 Essential (primary) hypertension: Secondary | ICD-10-CM | POA: Insufficient documentation

## 2024-01-19 DIAGNOSIS — Z7982 Long term (current) use of aspirin: Secondary | ICD-10-CM | POA: Insufficient documentation

## 2024-01-19 LAB — CBC
HCT: 38.1 % (ref 36.0–46.0)
Hemoglobin: 12.2 g/dL (ref 12.0–15.0)
MCH: 27.9 pg (ref 26.0–34.0)
MCHC: 32 g/dL (ref 30.0–36.0)
MCV: 87.2 fL (ref 80.0–100.0)
Platelets: 391 10*3/uL (ref 150–400)
RBC: 4.37 MIL/uL (ref 3.87–5.11)
RDW: 14 % (ref 11.5–15.5)
WBC: 9.5 10*3/uL (ref 4.0–10.5)
nRBC: 0 % (ref 0.0–0.2)

## 2024-01-19 LAB — TROPONIN I (HIGH SENSITIVITY): Troponin I (High Sensitivity): 3 ng/L (ref <18)

## 2024-01-19 LAB — BASIC METABOLIC PANEL WITH GFR
Anion gap: 13 (ref 5–15)
BUN: 12 mg/dL (ref 6–20)
CO2: 21 mmol/L — ABNORMAL LOW (ref 22–32)
Calcium: 8.8 mg/dL — ABNORMAL LOW (ref 8.9–10.3)
Chloride: 98 mmol/L (ref 98–111)
Creatinine, Ser: 0.57 mg/dL (ref 0.44–1.00)
GFR, Estimated: >60 mL/min (ref >60)
Glucose, Bld: 291 mg/dL — ABNORMAL HIGH (ref 70–99)
Potassium: 3.8 mmol/L (ref 3.5–5.1)
Sodium: 132 mmol/L — ABNORMAL LOW (ref 135–145)

## 2024-01-19 MED ORDER — KETOROLAC TROMETHAMINE 15 MG/ML IJ SOLN
15.0000 mg | Freq: Once | INTRAMUSCULAR | Status: AC
  Administered 2024-01-19: 15 mg via INTRAMUSCULAR
  Filled 2024-01-19: qty 1

## 2024-01-19 NOTE — Discharge Instructions (Signed)
 Today you were seen for chest pain, I suspect this is likely musculoskeletal in nature.  You may alternate taking Tylenol  Motrin  as needed for pain.  You may also use heat or ice as needed.  Please return to the ED if you have sudden chest pain, shortness of breath, or uncontrollable vomiting.  Thank you for letting us  treat you today. After reviewing your labs and imaging, I feel you are safe to go home. Please follow up with your PCP in the next several days and provide them with your records from this visit. Return to the Emergency Room if pain becomes severe or symptoms worsen.

## 2024-01-19 NOTE — ED Triage Notes (Signed)
 Pt reports:  Chest discomfort Hugged someone on Wednesday Heard a pop Pain worsening  Pain when breathing

## 2024-01-19 NOTE — ED Provider Notes (Signed)
  EMERGENCY DEPARTMENT AT [facility] Provider Note   CSN: [id number] Arrival date & time: 01/19/24  2053     Patient presents with: Chest Pain   Tina Pham is a 48 y.o. female past medical history significant for GERD, hypertension, chronic back pain, uncontrolled DM and anxiety presents today for chest discomfort.  Patient states she was hugging somebody on Wednesday and heard a pop.  Patient reports pleuritic chest pain and chest pain with movement of her left arm.  Patient denies fever, chills, nausea, vomiting, radiation of pain, shortness of breath, numbness, weakness, or abdominal pain at this time.    Chest Pain      Prior to Admission medications   Medication Sig Start Date End Date Taking? Authorizing Provider  Accu-Chek Softclix Lancets lancets SMARTSIG:Topical 04/27/21   [provider]  aspirin  EC 81 MG tablet Take 81 mg by mouth daily. Swallow whole.    [provider]  atorvastatin  (LIPITOR) 10 MG tablet TAKE 1 TABLET(10 MG) BY MOUTH DAILY Patient taking differently: Take 10 mg by mouth daily. 09/26/21   Sheree Penne Bruckner, MD  esomeprazole (NEXIUM) 20 MG capsule Take 20 mg by mouth daily as needed (acid reflux).    [provider]  FEROSUL 325 (65 Fe) MG tablet Take 325 mg by mouth 2 (two) times daily. 04/27/21   [provider]  gabapentin  (NEURONTIN ) 300 MG capsule TAKE 1 CAPSULE(300 MG) BY MOUTH THREE TIMES DAILY 05/10/22   Margrette Taft BRAVO, MD  glipiZIDE  (GLUCOTROL ) 10 MG tablet Take 10 mg by mouth in the morning. 04/13/21   [provider]  HYDROcodone -acetaminophen  (NORCO/VICODIN) 5-325 MG tablet Take 1 tablet by mouth every 6 (six) hours as needed for moderate pain (pain score 4-6). 08/30/23   Margrette Taft BRAVO, MD  predniSONE  (DELTASONE ) 20 MG tablet Take 2 tablets (40 mg total) by mouth daily with breakfast. For the next four days 12/24/23   Garrick Charleston, MD  tiZANidine  (ZANAFLEX )  4 MG tablet Take 1 tablet (4 mg total) by mouth every 6 (six) hours as needed for muscle spasms. 08/30/23   Margrette Taft BRAVO, MD  metFORMIN  (GLUCOPHAGE ) 500 MG tablet Take 1 tablet (500 mg total) by mouth 2 (two) times daily with a meal. 06/15/20 06/15/20  Flint Sonny POUR, PA-C    Allergies: Codeine, Metformin , Benadryl [diphenhydramine hcl], and Compazine    Review of Systems  Cardiovascular:  Positive for chest pain.  Musculoskeletal:  Positive for myalgias.    Updated Vital Signs BP (!) 158/77 (BP Location: Right Arm)   Pulse 91   Temp (!) 97.5 F (36.4 C) (Oral)   Resp 20   Ht 5' 8 (1.727 m)   Wt 99.8 kg   LMP 12/17/2023 (Approximate)   SpO2 100%   BMI 33.45 kg/m   Physical Exam Vitals and nursing note reviewed.  Constitutional:      General: She is not in acute distress.    Appearance: She is well-developed. She is not ill-appearing.  HENT:     Head: Normocephalic and atraumatic.   Eyes:     Extraocular Movements: Extraocular movements intact.     Conjunctiva/sclera: Conjunctivae normal.    Cardiovascular:     Rate and Rhythm: Normal rate and regular rhythm.     Heart sounds: Normal heart sounds. No murmur heard. Pulmonary:     Effort: Pulmonary effort is normal. No respiratory distress.     Breath sounds: Normal breath sounds.  Chest:  Chest wall: Tenderness present. No mass, deformity or crepitus.     Comments: Patient with reproducible chest pain to palpation of the left pectoralis.  Patient also reports pain with normal range of motion of her left arm.  Patient is neurovascularly intact.  No numbness or tingling down her left arm.  +2 radial pulses. Abdominal:     Palpations: Abdomen is soft.     Tenderness: There is no abdominal tenderness.   Musculoskeletal:        General: No swelling.     Cervical back: Neck supple.   Skin:    General: Skin is warm and dry.     Capillary Refill: Capillary refill takes less than 2 seconds.   Neurological:      General: No focal deficit present.     Mental Status: She is alert.     Cranial Nerves: No cranial nerve deficit.     Motor: No weakness.   Psychiatric:        Mood and Affect: Mood normal.     (all labs ordered are listed, but only abnormal results are displayed) Labs Reviewed  BASIC METABOLIC PANEL WITH GFR - Abnormal; Notable for the following components:      Result Value   Sodium 132 (*)    CO2 21 (*)    Glucose, Bld 291 (*)    Calcium  8.8 (*)    All other components within normal limits  CBC  TROPONIN I (HIGH SENSITIVITY)    EKG: EKG Interpretation Date/Time:  Saturday January 19 2024 21:35:40 EDT Ventricular Rate:  74 PR Interval:  152 QRS Duration:  93 QT Interval:  386 QTC Calculation: 429 R Axis:   23  Text Interpretation: Sinus rhythm Left atrial enlargement Low voltage, precordial leads Anteroseptal infarct, old No significant change since last tracing Confirmed by Towana Sharper [phone] on 01/19/2024 9:46:28 PM  Radiology: DG Chest 2 View Result Date: 01/19/2024 CLINICAL DATA:  Chest pain.  Potential for chest injury. EXAM: CHEST - 2 VIEW COMPARISON:  PA Lat 12/24/2023 FINDINGS: The heart size and mediastinal contours are within normal limits. Both lungs are clear. The visualized skeletal structures are intact, with mild kypholevoscoliosis and degenerative changes of the thoracic spine. IMPRESSION: No active cardiopulmonary disease.  Stable chest. Electronically Signed   By: Francis Quam M.D.   On: 01/19/2024 21:40     Procedures   Medications Ordered in the ED  ketorolac  (TORADOL ) 15 MG/ML injection 15 mg (15 mg Intramuscular Given 01/19/24 2141)                                    Medical Decision Making Amount and/or Complexity of Data Reviewed Labs: ordered. Radiology: ordered.   This patient presents to the ED for concern of chest pain, this involves an extensive number of treatment options, and is a complaint that carries with it a high risk of  complications and morbidity.  The differential diagnosis includes musculoskeletal pain, STEMI, NSTEMI, arrhythmia, electrolyte abnormality, anxiety, GERD   Additional history obtained:  Additional history obtained from EMR External records from outside source obtained and reviewed including orthopedic office visit notes   Lab Tests:  I Ordered, and personally interpreted labs.  The pertinent results include: CBC WNL, mild hyponatremia 132 corrected to 135 in the setting of hyperglycemia, hyperglycemia 291 which is not uncommon for this patient as she is uncontrolled, troponin 3   Imaging Studies  ordered:  I ordered imaging studies including chest x-ray I independently visualized and interpreted imaging which showed no active cardiopulmonary disease I agree with the radiologist interpretation   Cardiac Monitoring: / EKG:  The patient was maintained on a cardiac monitor.  I personally viewed and interpreted the cardiac monitored which showed an underlying rhythm of: Sinus rhythm, LAE   Problem List / ED Course / Critical interventions / Medication management I ordered medication including Toradol  Reevaluation of the patient after these medicines showed that the patient some improvement in pain I have reviewed the patients home medicines and have made adjustments as needed   Test / Admission - Considered:  Considered for admission or further workup however patient's vital signs, physical exam, labs, and imaging are reassuring.  Patient's symptoms likely due to musculoskeletal pain.  Patient advised to take Tylenol /Motrin  as needed for pain.  Patient may also use a heating pad or ice.  Patient given return precautions.  I feel patient safe for discharge at this time.     Final diagnoses:  Musculoskeletal chest pain    ED Discharge Orders     None          Francis Ileana LOISE DEVONNA 01/19/24 2215    Towana Ozell BROCKS, MD 01/20/24 [id number]

## 2024-04-20 ENCOUNTER — Emergency Department (HOSPITAL_COMMUNITY)
Admission: EM | Admit: 2024-04-20 | Discharge: 2024-04-20 | Disposition: A | Discharge status: Home / Self Care | Attending: Emergency Medicine | Admitting: Emergency Medicine

## 2024-04-20 ENCOUNTER — Other Ambulatory Visit: Payer: Self-pay

## 2024-04-20 ENCOUNTER — Encounter (HOSPITAL_COMMUNITY): Payer: Self-pay | Admitting: *Deleted

## 2024-04-20 ENCOUNTER — Emergency Department (HOSPITAL_COMMUNITY)

## 2024-04-20 DIAGNOSIS — I10 Essential (primary) hypertension: Secondary | ICD-10-CM | POA: Diagnosis not present

## 2024-04-20 DIAGNOSIS — F17210 Nicotine dependence, cigarettes, uncomplicated: Secondary | ICD-10-CM | POA: Insufficient documentation

## 2024-04-20 DIAGNOSIS — E119 Type 2 diabetes mellitus without complications: Secondary | ICD-10-CM | POA: Insufficient documentation

## 2024-04-20 DIAGNOSIS — Z7984 Long term (current) use of oral hypoglycemic drugs: Secondary | ICD-10-CM | POA: Diagnosis not present

## 2024-04-20 DIAGNOSIS — M25551 Pain in right hip: Secondary | ICD-10-CM | POA: Insufficient documentation

## 2024-04-20 DIAGNOSIS — Z7982 Long term (current) use of aspirin: Secondary | ICD-10-CM | POA: Insufficient documentation

## 2024-04-20 MED ORDER — KETOROLAC TROMETHAMINE 15 MG/ML IJ SOLN
15.0000 mg | Freq: Once | INTRAMUSCULAR | Status: AC
  Administered 2024-04-20: 15 mg via INTRAMUSCULAR
  Filled 2024-04-20: qty 1

## 2024-04-20 NOTE — ED Triage Notes (Signed)
 Pt with rt hip pain on and off for the past few days per pt, pt not able to get comfortable. Denies any falls or injuries. Pt states her hip feels like it wants to give out.

## 2024-04-20 NOTE — Discharge Instructions (Addendum)
 We evaluated you for your hip pain.  Your x-ray was negative for fracture or dislocation.  We suspect you may have bursitis, inflammation around the outside of the hip.  Please take Tylenol  (acetaminophen ) and Motrin  (ibuprofen ) for your symptoms at home.  You can take 1000 mg of Tylenol  every 6 hours and 600 mg of Motrin  every 6 hours as needed for your symptoms.  You can take these medicines together as needed, either at the same time, or alternating every 3 hours.  You can also apply ice or take over-the-counter lidocaine  patches.  Please call Dr. Margrette for follow-up.  You may also benefit from a steroid injection.  Please return if you have any new or worsening symptoms such as fevers or inability to walk.

## 2024-04-20 NOTE — ED Provider Notes (Signed)
 [place] EMERGENCY DEPARTMENT AT [facility] Provider Note  CSN: [id number] Arrival date & time: 04/20/24 1648  Chief Complaint(s) Hip Pain  HPI Tina Pham is a 48 y.o. female with history of hypertension presenting to the emergency department with right hip pain.  Denies any falls.  Reports pain worse with movement, pain is primarily on the outside aspect of the hip.  No pain in the remainder of the leg.  No fevers or chills.  Able to ambulate.   Past Medical History Past Medical History:  Diagnosis Date   Anxiety    panic attacks   Arthritis    Chronic back pain    Chronic bronchitis (HCC)    Depression    Diabetes mellitus    GERD (gastroesophageal reflux disease)    prn tums   High cholesterol    History of kidney stones    passed ?   Hypertension    no longer on BP medications   Migraine    Neuropathic pain of foot    Peripheral vascular disease    Plantar fasciitis    Pneumonia 2011ish   Sciatica    Torn rotator cuff    Patient Active Problem List   Diagnosis Date Noted   Critical limb ischemia of left lower extremity (HCC) 09/11/2023   Open wound of right great toe 09/29/2022   Uncontrolled diabetes mellitus with hyperglycemia (HCC) 09/29/2022   GERD (gastroesophageal reflux disease) 09/29/2022   Essential hypertension 09/29/2022   Mixed hyperlipidemia 09/29/2022   Obesity (BMI 30-39.9) 09/29/2022   Peripheral neuropathy 09/29/2022   Cellulitis of great toe of right foot 09/28/2022   Arthrosis of right acromioclavicular joint    PAD (peripheral artery disease) 07/13/2020   S/P right rotator cuff repair 10/04/2018   Complete tear of right rotator cuff    Chronic pain of left knee    Left shoulder pain 09/21/2015   Disorder of bursae and tendons in shoulder region 09/25/2013   Home Medication(s) Prior to Admission medications   Medication Sig Start Date End Date Taking? Authorizing Provider  Accu-Chek Softclix Lancets lancets  SMARTSIG:Topical 04/27/21   [provider]  aspirin  EC 81 MG tablet Take 81 mg by mouth daily. Swallow whole.    [provider]  atorvastatin  (LIPITOR) 10 MG tablet TAKE 1 TABLET(10 MG) BY MOUTH DAILY Patient taking differently: Take 10 mg by mouth daily. 09/26/21   Sheree Penne Bruckner, MD  esomeprazole (NEXIUM) 20 MG capsule Take 20 mg by mouth daily as needed (acid reflux).    [provider]  FEROSUL 325 (65 Fe) MG tablet Take 325 mg by mouth 2 (two) times daily. 04/27/21   [provider]  gabapentin  (NEURONTIN ) 300 MG capsule TAKE 1 CAPSULE(300 MG) BY MOUTH THREE TIMES DAILY 05/10/22   Margrette Taft BRAVO, MD  glipiZIDE  (GLUCOTROL ) 10 MG tablet Take 10 mg by mouth in the morning. 04/13/21   [provider]  HYDROcodone -acetaminophen  (NORCO/VICODIN) 5-325 MG tablet Take 1 tablet by mouth every 6 (six) hours as needed for moderate pain (pain score 4-6). 08/30/23   Margrette Taft BRAVO, MD  predniSONE  (DELTASONE ) 20 MG tablet Take 2 tablets (40 mg total) by mouth daily with breakfast. For the next four days 12/24/23   Garrick Charleston, MD  tiZANidine  (ZANAFLEX ) 4 MG tablet Take 1 tablet (4 mg total) by mouth every 6 (six) hours as needed for muscle spasms. 08/30/23   Margrette Taft BRAVO, MD  metFORMIN  (GLUCOPHAGE ) 500 MG tablet Take  1 tablet (500 mg total) by mouth 2 (two) times daily with a meal. 06/15/20 06/15/20  Flint Sonny POUR, PA-C                                                                                                                                    Past Surgical History Past Surgical History:  Procedure Laterality Date   ABDOMINAL AORTOGRAM W/LOWER EXTREMITY N/A 07/01/2020   Procedure: ABDOMINAL AORTOGRAM W/LOWER EXTREMITY;  Surgeon: Gretta Lonni PARAS, MD;  Location: [facility];  Service: Cardiovascular;  Laterality: N/A;   AMPUTATION Left 07/22/2020   Procedure: LEFT GREAT TOE AMPUTATION;  Surgeon: Sheree Penne Lonni,  MD;  Location: [facility] OR;  Service: Vascular;  Laterality: Left;   BACK SURGERY     herniated disc; had disc removed and then fusion; total of 3 surgeries   EYE SURGERY Bilateral    removal of cyst and straightening of muscles   KNEE ARTHROSCOPY WITH MEDIAL MENISECTOMY Left 12/27/2016   Procedure: LEFT KNEE DIAGNOSTIC ARTHROSCOPY;  Surgeon: Margrette Taft BRAVO, MD;  Location: [facility];  Service: Orthopedics;  Laterality: Left;   PERIPHERAL VASCULAR INTERVENTION Left 07/01/2020   Procedure: PERIPHERAL VASCULAR INTERVENTION;  Surgeon: Gretta Lonni PARAS, MD;  Location: [facility];  Service: Cardiovascular;  Laterality: Left;  common iliac   SHOULDER ARTHROSCOPY WITH DISTAL CLAVICLE RESECTION Right 05/13/2021   Procedure: ARTHROSCOPY RIGHT SHOULDER WITH DISTAL CLAVICLE EXCISION;  Surgeon: Margrette Taft BRAVO, MD;  Location: [facility];  Service: Orthopedics;  Laterality: Right;   SHOULDER OPEN ROTATOR CUFF REPAIR Right 09/19/2018   Procedure: ROTATOR CUFF REPAIR SHOULDER OPEN;  Surgeon: Margrette Taft BRAVO, MD;  Location: [facility];  Service: Orthopedics;  Laterality: Right;   SHOULDER OPEN ROTATOR CUFF REPAIR Right 08/05/2019   Procedure: ROTATOR CUFF REPAIR SHOULDER OPEN;  Surgeon: Margrette Taft BRAVO, MD;  Location: [facility];  Service: Orthopedics;  Laterality: Right;   SHOULDER OPEN ROTATOR CUFF REPAIR Right 05/13/2021   Procedure: OPEN ROTATOR CUFF REPAIR RIGHT SHOULDER;  Surgeon: Margrette Taft BRAVO, MD;  Location: [facility];  Service: Orthopedics;  Laterality: Right;   TUBAL LIGATION     Family History Family History  Problem Relation Age of Onset   Heart failure Mother    COPD Mother    Diabetes Mother    Heart failure Father    Diabetes Other     Social History Social History   Tobacco Use   Smoking status: Every Day    Current packs/day: 1.00    Average packs/day: 1 pack/day for 31.0 years (31.0 ttl pk-yrs)    Types: Cigarettes   Smokeless tobacco: Never  Vaping Use   Vaping status:  Never Used  Substance Use Topics   Alcohol use: No   Drug use: No   Allergies Codeine, Metformin , Benadryl [diphenhydramine hcl], and Compazine  Review of Systems Review of Systems  All other systems reviewed and are  negative.   Physical Exam Vital Signs  I have reviewed the triage vital signs BP 129/74   Pulse (!) 52   Temp 98 F (36.7 C) (Oral)   Resp 18 Comment: Simultaneous filing. User may not have seen previous data.  Ht 5' 8 (1.727 m)   Wt 99.8 kg   LMP 02/20/2024 (Approximate)   SpO2 95%   BMI 33.45 kg/m  Physical Exam Vitals and nursing note reviewed.  Constitutional:      Appearance: Normal appearance.  HENT:     Head: Normocephalic and atraumatic.     Mouth/Throat:     Mouth: Mucous membranes are moist.  Eyes:     Conjunctiva/sclera: Conjunctivae normal.  Cardiovascular:     Rate and Rhythm: Normal rate.     Pulses:          Dorsalis pedis pulses are 2+ on the right side and 2+ on the left side.  Pulmonary:     Effort: Pulmonary effort is normal. No respiratory distress.  Abdominal:     General: Abdomen is flat.  Musculoskeletal:        General: No deformity.     Comments: No limitation to range of motion of the right hip, knee, ankle, foot.  Tenderness to the lateral right hip  Skin:    General: Skin is warm and dry.     Capillary Refill: Capillary refill takes less than 2 seconds.  Neurological:     General: No focal deficit present.     Mental Status: She is alert. Mental status is at baseline.  Psychiatric:        Mood and Affect: Mood normal.        Behavior: Behavior normal.     ED Results and Treatments Labs (all labs ordered are listed, but only abnormal results are displayed) Labs Reviewed - No data to display                                                                                                                        Radiology DG Hip Unilat W or Wo Pelvis 2-3 Views Right Result Date: 04/20/2024 CLINICAL DATA:   Right-sided hip pain EXAM: DG HIP (WITH OR WITHOUT PELVIS) 2-3V RIGHT COMPARISON:  CT 03/16/2022 FINDINGS: Hardware at the lumbosacral junction. Left iliac stent. SI joints are patent. Pubic symphysis and rami appear normal. No fracture or malalignment. Vascular calcifications. IMPRESSION: No acute osseous abnormality. Electronically Signed   By: Luke Bun M.D.   On: 04/20/2024 17:56    Pertinent labs & imaging results that were available during my care of the patient were reviewed by me and considered in my medical decision making (see MDM for details).  Medications Ordered in ED Medications  ketorolac  (TORADOL ) 15 MG/ML injection 15 mg (has no administration in time range)  Procedures Procedures  (including critical care time)  Medical Decision Making / ED Course   MDM:  48 year old presented to the emergency room with hip pain.  Patient overall well-appearing, exam without deformity or limitation range of motion.  Vitals without fever.  Suspect likely bursitis.  Reviewed x-ray which shows no dislocation or fracture.  No falls or injury.  No evidence of septic joint or other dangerous process.  No evidence of other process such as cellulitis.  Recommend follow-up with her orthopedist for further care.      Imaging Studies ordered: I ordered imaging studies including XR hip On my interpretation imaging demonstrates no fracture/dislocation I independently visualized and interpreted imaging. I agree with the radiologist interpretation   Medicines ordered and prescription drug management: Meds ordered this encounter  Medications   ketorolac  (TORADOL ) 15 MG/ML injection 15 mg    -I have reviewed the patients home medicines and have made adjustments as needed   Co morbidities that complicate the patient evaluation  Past Medical History:   Diagnosis Date   Anxiety    panic attacks   Arthritis    Chronic back pain    Chronic bronchitis (HCC)    Depression    Diabetes mellitus    GERD (gastroesophageal reflux disease)    prn tums   High cholesterol    History of kidney stones    passed ?   Hypertension    no longer on BP medications   Migraine    Neuropathic pain of foot    Peripheral vascular disease    Plantar fasciitis    Pneumonia 2011ish   Sciatica    Torn rotator cuff       Dispostion: Disposition decision including need for hospitalization was considered, and patient discharged from emergency department.    Final Clinical Impression(s) / ED Diagnoses Final diagnoses:  Right hip pain     This chart was dictated using voice recognition software.  Despite best efforts to proofread,  errors can occur which can change the documentation meaning.    Francesca Elsie CROME, MD 04/20/24 2202

## 2024-04-20 NOTE — ED Notes (Signed)
 ED Provider at bedside.

## 2024-04-20 NOTE — ED Notes (Signed)
 Pt given ice pack with pt instructions.

## 2024-05-22 ENCOUNTER — Ambulatory Visit: Admission: EM | Admit: 2024-05-22 | Discharge: 2024-05-22 | Disposition: A | Discharge status: Home / Self Care

## 2024-05-22 ENCOUNTER — Other Ambulatory Visit: Payer: Self-pay

## 2024-05-22 DIAGNOSIS — I10 Essential (primary) hypertension: Secondary | ICD-10-CM

## 2024-05-22 DIAGNOSIS — Z8679 Personal history of other diseases of the circulatory system: Secondary | ICD-10-CM | POA: Diagnosis not present

## 2024-05-22 LAB — POCT URINE DIPSTICK
Bilirubin, UA: NEGATIVE
Blood, UA: NEGATIVE
Glucose, UA: NEGATIVE mg/dL
Ketones, POC UA: NEGATIVE mg/dL
Leukocytes, UA: NEGATIVE
Nitrite, UA: NEGATIVE
Spec Grav, UA: 1.025 (ref 1.010–1.025)
Urobilinogen, UA: 0.2 U/dL
pH, UA: 5 (ref 5.0–8.0)

## 2024-05-22 MED ORDER — LISINOPRIL 10 MG PO TABS: 10.0000 mg | ORAL_TABLET | Freq: Every day | ORAL | 0 refills | Status: DC

## 2024-05-22 NOTE — ED Provider Notes (Signed)
 [facility]    CSN: [id number] Arrival date & time: 05/22/24  1256      History   Chief Complaint No chief complaint on file.   HPI Tina Pham is a 48 y.o. female.   The history is provided by the patient.   Patient presents for complaints of elevated blood pressure.  Patient states that she is in a rehab program for opioid addiction, states that her doctor saw her earlier today and advised her to come to this clinic for her elevated blood pressure.  She states that at home, she has been checking her blood pressures, blood pressures are running in the 170s over 100s.  She states that she also has noticed that when she has a migraine, her blood pressure is elevated.  She denies chest pain, shortness of breath, difficulty breathing, or lower extremity edema.  She states that she has taken lisinopril and atenolol in the past, but has not been on any medications for her blood pressure recently.  Patient states that she is scheduled to see her PCP next month.  Patient states that the rehab doctor told her to come to this clinic to see if she can be started on medication until he she can see her PCP.  Patient also reports underlying history of diabetes.  Patient also reports that she is a current smoker.  Past Medical History:  Diagnosis Date   Anxiety    panic attacks   Arthritis    Chronic back pain    Chronic bronchitis (HCC)    Depression    Diabetes mellitus    GERD (gastroesophageal reflux disease)    prn tums   High cholesterol    History of kidney stones    passed ?   Hypertension    no longer on BP medications   Migraine    Neuropathic pain of foot    Peripheral vascular disease    Plantar fasciitis    Pneumonia 2011ish   Sciatica    Torn rotator cuff     Patient Active Problem List   Diagnosis Date Noted   Critical limb ischemia of left lower extremity (HCC) 09/11/2023   Open wound of right great toe 09/29/2022   Uncontrolled diabetes  mellitus with hyperglycemia (HCC) 09/29/2022   GERD (gastroesophageal reflux disease) 09/29/2022   Essential hypertension 09/29/2022   Mixed hyperlipidemia 09/29/2022   Obesity (BMI 30-39.9) 09/29/2022   Peripheral neuropathy 09/29/2022   Cellulitis of great toe of right foot 09/28/2022   Arthrosis of right acromioclavicular joint    PAD (peripheral artery disease) 07/13/2020   S/P right rotator cuff repair 10/04/2018   Complete tear of right rotator cuff    Chronic pain of left knee    Left shoulder pain 09/21/2015   Disorder of bursae and tendons in shoulder region 09/25/2013    Past Surgical History:  Procedure Laterality Date   ABDOMINAL AORTOGRAM W/LOWER EXTREMITY N/A 07/01/2020   Procedure: ABDOMINAL AORTOGRAM W/LOWER EXTREMITY;  Surgeon: Gretta Lonni PARAS, MD;  Location: [facility];  Service: Cardiovascular;  Laterality: N/A;   AMPUTATION Left 07/22/2020   Procedure: LEFT GREAT TOE AMPUTATION;  Surgeon: Sheree Penne Lonni, MD;  Location: [facility] OR;  Service: Vascular;  Laterality: Left;   BACK SURGERY     herniated disc; had disc removed and then fusion; total of 3 surgeries   EYE SURGERY Bilateral    removal of cyst and straightening of muscles   KNEE ARTHROSCOPY WITH MEDIAL MENISECTOMY Left  12/27/2016   Procedure: LEFT KNEE DIAGNOSTIC ARTHROSCOPY;  Surgeon: Margrette Taft BRAVO, MD;  Location: [facility];  Service: Orthopedics;  Laterality: Left;   PERIPHERAL VASCULAR INTERVENTION Left 07/01/2020   Procedure: PERIPHERAL VASCULAR INTERVENTION;  Surgeon: Gretta Lonni PARAS, MD;  Location: [facility];  Service: Cardiovascular;  Laterality: Left;  common iliac   SHOULDER ARTHROSCOPY WITH DISTAL CLAVICLE RESECTION Right 05/13/2021   Procedure: ARTHROSCOPY RIGHT SHOULDER WITH DISTAL CLAVICLE EXCISION;  Surgeon: Margrette Taft BRAVO, MD;  Location: [facility];  Service: Orthopedics;  Laterality: Right;   SHOULDER OPEN ROTATOR CUFF REPAIR Right 09/19/2018   Procedure:  ROTATOR CUFF REPAIR SHOULDER OPEN;  Surgeon: Margrette Taft BRAVO, MD;  Location: [facility];  Service: Orthopedics;  Laterality: Right;   SHOULDER OPEN ROTATOR CUFF REPAIR Right 08/05/2019   Procedure: ROTATOR CUFF REPAIR SHOULDER OPEN;  Surgeon: Margrette Taft BRAVO, MD;  Location: [facility];  Service: Orthopedics;  Laterality: Right;   SHOULDER OPEN ROTATOR CUFF REPAIR Right 05/13/2021   Procedure: OPEN ROTATOR CUFF REPAIR RIGHT SHOULDER;  Surgeon: Margrette Taft BRAVO, MD;  Location: [facility];  Service: Orthopedics;  Laterality: Right;   TUBAL LIGATION      OB History     Gravida  3   Para  3   Term  3   Preterm      AB      Living  3      SAB      IAB      Ectopic      Multiple      Live Births               Home Medications    Prior to Admission medications   Medication Sig Start Date End Date Taking? Authorizing Provider  Buprenorphine HCl-Naloxone HCl 8-2 MG FILM Place under the tongue 4 (four) times daily. 05/11/24  Yes [provider]  cloNIDine  (CATAPRES ) 0.1 MG tablet Take 0.1 mg by mouth at bedtime. 05/11/24  Yes [provider]  lisinopril (ZESTRIL) 10 MG tablet Take 1 tablet (10 mg total) by mouth daily. 05/22/24  Yes Leath-Warren, Etta PARAS, NP  Accu-Chek Softclix Lancets lancets SMARTSIG:Topical 04/27/21   [provider]  aspirin  EC 81 MG tablet Take 81 mg by mouth daily. Swallow whole.    [provider]  atorvastatin  (LIPITOR) 10 MG tablet TAKE 1 TABLET(10 MG) BY MOUTH DAILY Patient taking differently: Take 10 mg by mouth daily. 09/26/21   Sheree Penne Lonni, MD  esomeprazole (NEXIUM) 20 MG capsule Take 20 mg by mouth daily as needed (acid reflux).    [provider]  FEROSUL 325 (65 Fe) MG tablet Take 325 mg by mouth 2 (two) times daily. 04/27/21   [provider]  gabapentin  (NEURONTIN ) 300 MG capsule TAKE 1 CAPSULE(300 MG) BY MOUTH THREE TIMES DAILY 05/10/22   Margrette Taft BRAVO, MD  glipiZIDE   (GLUCOTROL ) 10 MG tablet Take 10 mg by mouth in the morning. 04/13/21   [provider]  HYDROcodone -acetaminophen  (NORCO/VICODIN) 5-325 MG tablet Take 1 tablet by mouth every 6 (six) hours as needed for moderate pain (pain score 4-6). 08/30/23   Margrette Taft BRAVO, MD  predniSONE  (DELTASONE ) 20 MG tablet Take 2 tablets (40 mg total) by mouth daily with breakfast. For the next four days 12/24/23   Garrick Charleston, MD  tiZANidine  (ZANAFLEX ) 4 MG tablet Take 1 tablet (4 mg total) by mouth every 6 (six) hours as needed for muscle spasms. 08/30/23   Margrette,  Taft BRAVO, MD  metFORMIN  (GLUCOPHAGE ) 500 MG tablet Take 1 tablet (500 mg total) by mouth 2 (two) times daily with a meal. 06/15/20 06/15/20  Flint Sonny POUR, PA-C    Family History Family History  Problem Relation Age of Onset   Heart failure Mother    COPD Mother    Diabetes Mother    Heart failure Father    Diabetes Other     Social History Social History   Tobacco Use   Smoking status: Every Day    Current packs/day: 1.00    Average packs/day: 1 pack/day for 31.0 years (31.0 ttl pk-yrs)    Types: Cigarettes   Smokeless tobacco: Never  Vaping Use   Vaping status: Never Used  Substance Use Topics   Alcohol use: No   Drug use: No     Allergies   Codeine, Metformin , Benadryl [diphenhydramine hcl], and Compazine   Review of Systems Review of Systems Per HPI  Physical Exam Triage Vital Signs ED Triage Vitals  Encounter Vitals Group     BP 05/22/24 1335 (!) 158/82     Girls Systolic BP Percentile --      Girls Diastolic BP Percentile --      Boys Systolic BP Percentile --      Boys Diastolic BP Percentile --      Pulse Rate 05/22/24 1335 83     Resp 05/22/24 1335 20     Temp 05/22/24 1335 99.2 F (37.3 C)     Temp Source 05/22/24 1335 Oral     SpO2 05/22/24 1335 93 %     Weight --      Height --      Head Circumference --      Peak Flow --      Pain Score 05/22/24 1338 0     Pain Loc --      Pain  Education --      Exclude from Growth Chart --    No data found.  Updated Vital Signs BP (!) 158/82 (BP Location: Right Arm)   Pulse 83   Temp 99.2 F (37.3 C) (Oral)   Resp 20   LMP  (LMP Unknown)   SpO2 93%   Visual Acuity Right Eye Distance:   Left Eye Distance:   Bilateral Distance:    Right Eye Near:   Left Eye Near:    Bilateral Near:     Physical Exam Vitals and nursing note reviewed.  Constitutional:      General: She is not in acute distress.    Appearance: Normal appearance.  HENT:     Head: Normocephalic.  Eyes:     Extraocular Movements: Extraocular movements intact.     Pupils: Pupils are equal, round, and reactive to light.  Cardiovascular:     Rate and Rhythm: Normal rate and regular rhythm.     Pulses: Normal pulses.     Heart sounds: Normal heart sounds.  Pulmonary:     Effort: Pulmonary effort is normal. No respiratory distress.     Breath sounds: Normal breath sounds. No stridor. No wheezing, rhonchi or rales.  Abdominal:     General: Bowel sounds are normal.     Palpations: Abdomen is soft.  Musculoskeletal:     Cervical back: Normal range of motion.     Right lower leg: No edema.     Left lower leg: No edema.  Skin:    General: Skin is warm and dry.  Neurological:  General: No focal deficit present.     Mental Status: She is alert and oriented to person, place, and time.  Psychiatric:        Mood and Affect: Mood normal.        Behavior: Behavior normal.      UC Treatments / Results  Labs (all labs ordered are listed, but only abnormal results are displayed) Labs Reviewed  POCT URINE DIPSTICK - Abnormal; Notable for the following components:      Result Value   Color, UA straw (*)    Protein Ur, POC trace (*)    All other components within normal limits    EKG: Normal sinus rhythm, no STEMI.   Radiology No results found.  Procedures Procedures (including critical care time)  Medications Ordered in UC Medications -  No data to display  Initial Impression / Assessment and Plan / UC Course  I have reviewed the triage vital signs and the nursing notes.  Pertinent labs & imaging results that were available during my care of the patient were reviewed by me and considered in my medical decision making (see chart for details).  Urinalysis with trace protein.  EKG shows normal sinus rhythm, no STEMI.  Reviewed patient's most recent lab work dated 01/19/2024, creatinine was 0.57, GFR was greater than 60.  Patient is in agreement with restarting lisinopril 10 mg until she can follow-up with her PCP.  Patient would like to hold off on a urine culture today as she states that she would like to follow-up with her PCP regarding the protein in her urine.  Supportive care recommendations were provided discussed with the patient to include smoking cessation, dietary modifications, and regular exercise.  Patient was given strict ER follow-up precautions.  Patient was in agreement with this plan of care and verbalizes understanding.  All questions were answered.  Patient stable for discharge.  Final Clinical Impressions(s) / UC Diagnoses   Final diagnoses:  Elevated blood pressure reading with diagnosis of hypertension  History of hypertension     Discharge Instructions      Take medication as prescribed. Increase fluids and allow for plenty of rest. You may take Tylenol  as needed for pain, fever, or general discomfort. As discussed, recommend dietary changes to include monitoring your sodium intake, and implementing regular exercise to help lower your blood pressure. Consider smoking cessation which will help lower your blood pressure. Go to the emergency department if you experience chest pain, shortness of breath, difficulty breathing, or other concerns. Follow-up with your PCP as scheduled. Follow-up as needed.     ED Prescriptions     Medication Sig Dispense Auth. Provider   lisinopril (ZESTRIL) 10 MG tablet  Take 1 tablet (10 mg total) by mouth daily. 30 tablet Leath-Warren, Etta PARAS, NP      PDMP not reviewed this encounter.   Gilmer Etta PARAS, NP 05/22/24 1428

## 2024-05-22 NOTE — Discharge Instructions (Signed)
 Take medication as prescribed. Increase fluids and allow for plenty of rest. You may take Tylenol  as needed for pain, fever, or general discomfort. As discussed, recommend dietary changes to include monitoring your sodium intake, and implementing regular exercise to help lower your blood pressure. Consider smoking cessation which will help lower your blood pressure. Go to the emergency department if you experience chest pain, shortness of breath, difficulty breathing, or other concerns. Follow-up with your PCP as scheduled. Follow-up as needed.

## 2024-05-22 NOTE — ED Triage Notes (Signed)
 Pt reports  elevated BP states she is in narcotic rehab , is unable to be seen by primary care until mid November as a new patient. Was told by Rehab doc to come here.

## 2024-06-06 ENCOUNTER — Encounter: Payer: Self-pay | Admitting: Family Medicine

## 2024-06-06 ENCOUNTER — Ambulatory Visit (INDEPENDENT_AMBULATORY_CARE_PROVIDER_SITE_OTHER): Payer: Self-pay | Admitting: Family Medicine

## 2024-06-06 VITALS — BP 117/72 | HR 96 | Temp 98.3°F | Ht 68.0 in | Wt 217.6 lb

## 2024-06-06 DIAGNOSIS — M5090 Cervical disc disorder, unspecified, unspecified cervical region: Secondary | ICD-10-CM

## 2024-06-06 DIAGNOSIS — I10 Essential (primary) hypertension: Secondary | ICD-10-CM | POA: Diagnosis not present

## 2024-06-06 DIAGNOSIS — G43809 Other migraine, not intractable, without status migrainosus: Secondary | ICD-10-CM

## 2024-06-06 DIAGNOSIS — Z130 Encounter for screening for diseases of the blood and blood-forming organs and certain disorders involving the immune mechanism: Secondary | ICD-10-CM

## 2024-06-06 DIAGNOSIS — Z23 Encounter for immunization: Secondary | ICD-10-CM | POA: Diagnosis not present

## 2024-06-06 DIAGNOSIS — F419 Anxiety disorder, unspecified: Secondary | ICD-10-CM

## 2024-06-06 DIAGNOSIS — E119 Type 2 diabetes mellitus without complications: Secondary | ICD-10-CM | POA: Diagnosis not present

## 2024-06-06 DIAGNOSIS — Z8639 Personal history of other endocrine, nutritional and metabolic disease: Secondary | ICD-10-CM | POA: Diagnosis not present

## 2024-06-06 DIAGNOSIS — Z136 Encounter for screening for cardiovascular disorders: Secondary | ICD-10-CM

## 2024-06-06 DIAGNOSIS — K219 Gastro-esophageal reflux disease without esophagitis: Secondary | ICD-10-CM

## 2024-06-06 LAB — BAYER DCA HB A1C WAIVED: HB A1C (BAYER DCA - WAIVED): 7.7 % — ABNORMAL HIGH (ref 4.8–5.6)

## 2024-06-06 MED ORDER — ESCITALOPRAM OXALATE 10 MG PO TABS: 10.0000 mg | ORAL_TABLET | Freq: Every day | ORAL | 0 refills | Status: DC

## 2024-06-06 MED ORDER — LISINOPRIL 10 MG PO TABS: 10.0000 mg | ORAL_TABLET | Freq: Every day | ORAL | 2 refills | Status: DC

## 2024-06-06 NOTE — Progress Notes (Signed)
 New Patient Office Visit  Patient ID: Tina Pham, Female   DOB: 04/24/1976 48 y.o. MRN: [id number]  Chief Complaint  Patient presents with   Establish Care   Hypertension   Diabetes   Anxiety & Depression   Subjective:     Tina Pham presents to establish care  Hypertension  Diabetes    Discussed the use of AI scribe software for clinical note transcription with the patient, who gave verbal consent to proceed.  History of Present Illness   Tina Pham is a 48 year old female who presents for establishment of care and management of multiple chronic conditions.  Hypertension - Currently taking lisinopril, obtained from urgent care with a 30-day supply.  Type 2 diabetes mellitus - Diagnosed after gestational diabetes with her last two pregnancies. - Previously treated with metformin  and glipizide ; discontinued due to insurance loss. Patient reports that past doctor stopped metformin  because he said it was reacting with her blood but said that it was not an allergy.  - Not actively managing diabetes due to lack of insurance; anticipates elevated A1c.  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 - Receiving Suboxone therapy. - Engaged in counseling for addiction treatment.  Peripheral vascular disease and amputation - History of L great toe amputation - Taking baby aspirin ; not currently on anticoagulants. - Followed by vascular - Hx of L iliac stent several years ago  Musculoskeletal pain and neuropathy - History of three surgeries on the same shoulder. - Diagnosed with bone spurs and cervical radiculopathy leading to stenosis. - Persistent left shoulder pain, possibly related to cervical pathology.  Gastroesophageal reflux disease - GERD present for several years. - Taking esomeprazole daily for symptom control.  Migraine headaches - Migraines occurring almost daily - Previous  MRI revealed two old small cortical infarctions in the left parietooccipital region and a third in the frontoparietal vertex.  Mood disorders - History of anxiety and depression. - Previously treated with Lexapro and Celexa, which were effective but discontinued due to insurance loss. - Currently seeing a counselor for addiction treatment.  Iron deficiency - Is not currently taking iron supplementation       Outpatient Encounter Medications as of 06/06/2024  Medication Sig   atorvastatin  (LIPITOR) 10 MG tablet TAKE 1 TABLET(10 MG) BY MOUTH DAILY (Patient taking differently: Take 10 mg by mouth daily.)   Buprenorphine HCl-Naloxone HCl 8-2 MG FILM Place under the tongue 4 (four) times daily.   cloNIDine  (CATAPRES ) 0.1 MG tablet Take 0.1 mg by mouth at bedtime.   escitalopram (LEXAPRO) 10 MG tablet Take 1 tablet (10 mg total) by mouth daily.   esomeprazole (NEXIUM) 20 MG capsule Take 20 mg by mouth daily as needed (acid reflux).   gabapentin  (NEURONTIN ) 300 MG capsule TAKE 1 CAPSULE(300 MG) BY MOUTH THREE TIMES DAILY   [DISCONTINUED] lisinopril (ZESTRIL) 10 MG tablet Take 1 tablet (10 mg total) by mouth daily.   Accu-Chek Softclix Lancets lancets SMARTSIG:Topical (Patient not taking: Reported on 06/06/2024)   lisinopril (ZESTRIL) 10 MG tablet Take 1 tablet (10 mg total) by mouth daily.   [DISCONTINUED] aspirin  EC 81 MG tablet Take 81 mg by mouth daily. Swallow whole.   [DISCONTINUED] FEROSUL 325 (65 Fe) MG tablet Take 325 mg by mouth 2 (two) times daily.   [DISCONTINUED] glipiZIDE  (GLUCOTROL ) 10 MG tablet Take 10 mg by mouth in the morning. (Patient not taking: Reported on 06/06/2024)   [DISCONTINUED] HYDROcodone -acetaminophen  (NORCO/VICODIN) 5-325 MG  tablet Take 1 tablet by mouth every 6 (six) hours as needed for moderate pain (pain score 4-6).   [DISCONTINUED] metFORMIN  (GLUCOPHAGE ) 500 MG tablet Take 1 tablet (500 mg total) by mouth 2 (two) times daily with a meal.   [DISCONTINUED]  predniSONE  (DELTASONE ) 20 MG tablet Take 2 tablets (40 mg total) by mouth daily with breakfast. For the next four days   [DISCONTINUED] tiZANidine  (ZANAFLEX ) 4 MG tablet Take 1 tablet (4 mg total) by mouth every 6 (six) hours as needed for muscle spasms.   No facility-administered encounter medications on file as of 06/06/2024.    Past Medical History:  Diagnosis Date   Anxiety    panic attacks   Arthritis    Chronic back pain    Chronic bronchitis (HCC)    Depression    Diabetes mellitus    GERD (gastroesophageal reflux disease)    prn tums   High cholesterol    History of kidney stones    passed ?   Hypertension    no longer on BP medications   Migraine    Neuropathic pain of foot    Peripheral vascular disease    Plantar fasciitis    Pneumonia 2011ish   Sciatica    Torn rotator cuff     Past Surgical History:  Procedure Laterality Date   ABDOMINAL AORTOGRAM W/LOWER EXTREMITY N/A 07/01/2020   Procedure: ABDOMINAL AORTOGRAM W/LOWER EXTREMITY;  Surgeon: Gretta Lonni PARAS, MD;  Location: [facility];  Service: Cardiovascular;  Laterality: N/A;   AMPUTATION Left 07/22/2020   Procedure: LEFT GREAT TOE AMPUTATION;  Surgeon: Sheree Penne Lonni, MD;  Location: [facility] OR;  Service: Vascular;  Laterality: Left;   BACK SURGERY     herniated disc; had disc removed and then fusion; total of 3 surgeries   EYE SURGERY Bilateral    removal of cyst and straightening of muscles   KNEE ARTHROSCOPY WITH MEDIAL MENISECTOMY Left 12/27/2016   Procedure: LEFT KNEE DIAGNOSTIC ARTHROSCOPY;  Surgeon: Margrette Taft BRAVO, MD;  Location: [facility];  Service: Orthopedics;  Laterality: Left;   PERIPHERAL VASCULAR INTERVENTION Left 07/01/2020   Procedure: PERIPHERAL VASCULAR INTERVENTION;  Surgeon: Gretta Lonni PARAS, MD;  Location: [facility];  Service: Cardiovascular;  Laterality: Left;  common iliac   SHOULDER ARTHROSCOPY WITH DISTAL CLAVICLE RESECTION Right 05/13/2021   Procedure:  ARTHROSCOPY RIGHT SHOULDER WITH DISTAL CLAVICLE EXCISION;  Surgeon: Margrette Taft BRAVO, MD;  Location: [facility];  Service: Orthopedics;  Laterality: Right;   SHOULDER OPEN ROTATOR CUFF REPAIR Right 09/19/2018   Procedure: ROTATOR CUFF REPAIR SHOULDER OPEN;  Surgeon: Margrette Taft BRAVO, MD;  Location: [facility];  Service: Orthopedics;  Laterality: Right;   SHOULDER OPEN ROTATOR CUFF REPAIR Right 08/05/2019   Procedure: ROTATOR CUFF REPAIR SHOULDER OPEN;  Surgeon: Margrette Taft BRAVO, MD;  Location: [facility];  Service: Orthopedics;  Laterality: Right;   SHOULDER OPEN ROTATOR CUFF REPAIR Right 05/13/2021   Procedure: OPEN ROTATOR CUFF REPAIR RIGHT SHOULDER;  Surgeon: Margrette Taft BRAVO, MD;  Location: [facility];  Service: Orthopedics;  Laterality: Right;   TUBAL LIGATION      Family History  Problem Relation Age of Onset   Heart failure Mother    COPD Mother    Diabetes Mother    Heart failure Father    Diabetes Other     Social History   Socioeconomic History   Marital status: Legally Separated    Spouse name: Not on file   Number of children: Not  on file   Years of education: Not on file   Highest education level: Not on file  Occupational History   Not on file  Tobacco Use   Smoking status: Every Day    Current packs/day: 1.00    Average packs/day: 1 pack/day for 31.0 years (31.0 ttl pk-yrs)    Types: Cigarettes   Smokeless tobacco: Never  Vaping Use   Vaping status: Never Used  Substance and Sexual Activity   Alcohol use: No   Drug use: No   Sexual activity: Yes    Birth control/protection: Surgical  Other Topics Concern   Not on file  Social History Narrative   Not on file   Social Drivers of Health   Financial Resource Strain: Not on file  Food Insecurity: No Food Insecurity (09/29/2022)   Hunger Vital Sign    Worried About Running Out of Food in the Last Year: Never true    Ran Out of Food in the Last Year: Never true  Transportation Needs: No Transportation Needs  (09/29/2022)   PRAPARE - Administrator, Civil Service (Medical): No    Lack of Transportation (Non-Medical): No  Physical Activity: Not on file  Stress: Not on file  Social Connections: Not on file  Intimate Partner Violence: Not At Risk (09/29/2022)   Humiliation, Afraid, Rape, and Kick questionnaire    Fear of Current or Ex-Partner: No    Emotionally Abused: No    Physically Abused: No    Sexually Abused: No    ROS    Objective:    BP 117/72   Pulse 96   Temp 98.3 F (36.8 C)   Ht 5' 8 (1.727 m)   Wt 217 lb 9.6 oz (98.7 kg)   LMP  (LMP Unknown)   SpO2 97%   BMI 33.09 kg/m   Physical Exam Vitals reviewed.  Constitutional:      Appearance: Normal appearance.  HENT:     Head: Normocephalic and atraumatic.  Eyes:     Extraocular Movements: Extraocular movements intact.     Conjunctiva/sclera: Conjunctivae normal.     Pupils: Pupils are equal, round, and reactive to light.  Cardiovascular:     Rate and Rhythm: Normal rate and regular rhythm.     Pulses: Normal pulses.     Heart sounds: Normal heart sounds. No murmur heard. Pulmonary:     Effort: Pulmonary effort is normal. No respiratory distress.     Breath sounds: Normal breath sounds.  Musculoskeletal:        General: Normal range of motion.     Cervical back: Normal range of motion.  Skin:    General: Skin is warm and dry.  Neurological:     General: No focal deficit present.     Mental Status: She is alert and oriented to person, place, and time.  Psychiatric:        Mood and Affect: Mood normal.        Behavior: Behavior normal.          Assessment & Plan:   Problem List Items Addressed This Visit       Cardiovascular and Mediastinum   Essential hypertension   Relevant Medications   lisinopril (ZESTRIL) 10 MG tablet   Other Relevant Orders   Microalbumin/Creatinine Ratio, Urine     Digestive   GERD (gastroesophageal reflux disease)   Relevant Orders   Ambulatory referral to  Gastroenterology   Other Visit Diagnoses  Encounter for immunization    -  Primary   Relevant Orders   Flu vaccine trivalent PF, 6mos and older(Flulaval,Afluria,Fluarix,Fluzone) (Completed)     Type 2 diabetes mellitus without complications, unspecified whether long term insulin  use (HCC)       Relevant Medications   lisinopril (ZESTRIL) 10 MG tablet   Other Relevant Orders   Bayer DCA Hb A1c Waived     Screening for endocrine, nutritional, metabolic and immunity disorder       Relevant Orders   TSH   T4, Free   Lipid Panel   Bayer DCA Hb A1c Waived   CBC with Differential   Comprehensive metabolic panel with GFR     Encounter for screening for cardiovascular disorders       Relevant Orders   Lipid Panel     Anxiety       Relevant Medications   escitalopram (LEXAPRO) 10 MG tablet     History of iron deficiency       Relevant Orders   Iron, TIBC and Ferritin Panel     Cervical disc disease       Relevant Orders   Ambulatory referral to Orthopedic Surgery     Other migraine without status migrainosus, not intractable       Relevant Medications   escitalopram (LEXAPRO) 10 MG tablet   lisinopril (ZESTRIL) 10 MG tablet   Other Relevant Orders   Ambulatory referral to Neurology       Assessment and Plan    Establishing care  - Ordered comprehensive lab work, results pending  Type 2 diabetes mellitus with diabetic neuropathy Currently not on medication due to insurance issues. - Ordered A1c test. - Will discuss potential diabetes management options based on A1c results.  Essential hypertension Currently managed with lisinopril. Blood pressure well-controlled today. - Continue lisinopril 10 mg/day - Monitor blood pressure regularly.  Depression and anxiety Previously managed with SSRIs. Elevated phq and GAD scores today. Prefers to restart SSRI therapy. - Prescribed Lexapro 10 mg daily. - Scheduled follow-up in two weeks to monitor response to SSRI and blood  pressure. - Discussed potential side effects of SSRI therapy including Si.  - F/u sooner for any side effects or SI  Opioid use disorder in remission on medication-assisted therapy Opioid use disorder in remission, currently on Suboxone. Actively engaged in outpatient rehabilitation program. - Continue Suboxone therapy. - Continue outpatient rehabilitation program.  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 Currently not on blood thinners but advised to take aspirin . - Advised restarting 81 aspirin  daily.  Cervical radiculopathy and stenosis Previous insurance loss prevented further evaluation.  - previous ortho notes recommended Dr. Georgina referral due to MRI which showed cervical disc disease with foraminal stenosis bilaterally.  There was questionable need for surgery - Referred to Dr. Georgina for evaluation.  Chronic migraine with history of small cortical infarcts - Referred to neurology for further evaluation and management.  Gastroesophageal reflux disease (GERD) Chronic GERD managed with omeprazole. Long-term PPI use necessitates GI evaluation. - Continue omeprazole. - Referred to gastroenterology for evaluation  Iron deficiency - Ordered iron panel to assess current iron status. - Will discuss potential need for iron supplementation based on lab results.  Peripheral vascular disease with prior vascular stent Peripheral vascular disease with prior vascular stent placement. - Continue aspirin  therapy. - F/u with vascular as recommended by them        Return in about  2 weeks (around 06/20/2024).   Oneil LELON Severin, FNP [facility]

## 2024-06-06 NOTE — Progress Notes (Signed)
 SABRA

## 2024-06-07 LAB — IRON,TIBC AND FERRITIN PANEL
Ferritin: 13 ng/mL — ABNORMAL LOW (ref 15–150)
Iron Saturation: 12 % — ABNORMAL LOW (ref 15–55)
Iron: 46 ug/dL (ref 27–159)
Total Iron Binding Capacity: 392 ug/dL (ref 250–450)
UIBC: 346 ug/dL (ref 131–425)

## 2024-06-07 LAB — COMPREHENSIVE METABOLIC PANEL WITH GFR
ALT: 12 IU/L (ref 0–32)
AST: 18 IU/L (ref 0–40)
Albumin: 4.5 g/dL (ref 3.9–4.9)
Alkaline Phosphatase: 72 IU/L (ref 41–116)
BUN/Creatinine Ratio: 19 (ref 9–23)
BUN: 13 mg/dL (ref 6–24)
Bilirubin Total: 0.3 mg/dL (ref 0.0–1.2)
CO2: 22 mmol/L (ref 20–29)
Calcium: 9.6 mg/dL (ref 8.7–10.2)
Chloride: 101 mmol/L (ref 96–106)
Creatinine, Ser: 0.69 mg/dL (ref 0.57–1.00)
Globulin, Total: 2.6 g/dL (ref 1.5–4.5)
Glucose: 159 mg/dL — ABNORMAL HIGH (ref 70–99)
Potassium: 4.4 mmol/L (ref 3.5–5.2)
Sodium: 139 mmol/L (ref 134–144)
Total Protein: 7.1 g/dL (ref 6.0–8.5)
eGFR: 107 mL/min/1.73 (ref >59)

## 2024-06-07 LAB — CBC WITH DIFFERENTIAL/PLATELET
Basophils Absolute: 0.1 x10E3/uL (ref 0.0–0.2)
Basos: 1 %
EOS (ABSOLUTE): 0.3 x10E3/uL (ref 0.0–0.4)
Eos: 4 %
Hematocrit: 38.9 % (ref 34.0–46.6)
Hemoglobin: 12.2 g/dL (ref 11.1–15.9)
Immature Grans (Abs): 0 x10E3/uL (ref 0.0–0.1)
Immature Granulocytes: 0 %
Lymphocytes Absolute: 1.8 x10E3/uL (ref 0.7–3.1)
Lymphs: 24 %
MCH: 25.7 pg — ABNORMAL LOW (ref 26.6–33.0)
MCHC: 31.4 g/dL — ABNORMAL LOW (ref 31.5–35.7)
MCV: 82 fL (ref 79–97)
Monocytes Absolute: 0.5 x10E3/uL (ref 0.1–0.9)
Monocytes: 7 %
Neutrophils Absolute: 4.7 x10E3/uL (ref 1.4–7.0)
Neutrophils: 64 %
Platelets: 333 x10E3/uL (ref 150–450)
RBC: 4.74 x10E6/uL (ref 3.77–5.28)
RDW: 15.8 % — ABNORMAL HIGH (ref 11.7–15.4)
WBC: 7.5 x10E3/uL (ref 3.4–10.8)

## 2024-06-07 LAB — LIPID PANEL
Chol/HDL Ratio: 6.2 ratio — ABNORMAL HIGH (ref 0.0–4.4)
Cholesterol, Total: 248 mg/dL — ABNORMAL HIGH (ref 100–199)
HDL: 40 mg/dL (ref >39)
LDL Chol Calc (NIH): 165 mg/dL — ABNORMAL HIGH (ref 0–99)
Triglycerides: 231 mg/dL — ABNORMAL HIGH (ref 0–149)
VLDL Cholesterol Cal: 43 mg/dL — ABNORMAL HIGH (ref 5–40)

## 2024-06-07 LAB — TSH: TSH: 5.52 u[IU]/mL — ABNORMAL HIGH (ref 0.450–4.500)

## 2024-06-07 LAB — MICROALBUMIN / CREATININE URINE RATIO
Creatinine, Urine: 90.7 mg/dL
Microalb/Creat Ratio: 18 mg/g{creat} (ref 0–29)
Microalbumin, Urine: 16 ug/mL

## 2024-06-07 LAB — T4, FREE: Free T4: 0.95 ng/dL (ref 0.82–1.77)

## 2024-06-10 ENCOUNTER — Ambulatory Visit: Payer: Self-pay | Admitting: Family Medicine

## 2024-06-10 ENCOUNTER — Other Ambulatory Visit: Payer: Self-pay | Admitting: Family Medicine

## 2024-06-10 DIAGNOSIS — E7800 Pure hypercholesterolemia, unspecified: Secondary | ICD-10-CM

## 2024-06-10 DIAGNOSIS — E1169 Type 2 diabetes mellitus with other specified complication: Secondary | ICD-10-CM

## 2024-06-10 DIAGNOSIS — E119 Type 2 diabetes mellitus without complications: Secondary | ICD-10-CM

## 2024-06-10 MED ORDER — SEMAGLUTIDE(0.25 OR 0.5MG/DOS) 2 MG/3ML ~~LOC~~ SOPN: 0.2500 mg | PEN_INJECTOR | SUBCUTANEOUS | 0 refills | Status: AC

## 2024-06-10 MED ORDER — ATORVASTATIN CALCIUM 20 MG PO TABS: 20.0000 mg | ORAL_TABLET | Freq: Every day | ORAL | 0 refills | Status: AC

## 2024-06-13 ENCOUNTER — Other Ambulatory Visit (HOSPITAL_COMMUNITY): Payer: Self-pay

## 2024-06-13 ENCOUNTER — Telehealth: Payer: Self-pay | Admitting: Pharmacy Technician

## 2024-06-13 NOTE — Telephone Encounter (Signed)
 Spoke with patient and she is aware

## 2024-06-13 NOTE — Telephone Encounter (Signed)
 Pharmacy Patient Advocate Encounter  Received notification from [employer] that Prior Authorization for Ozempic  (0.25 or 0.5 MG/DOSE) 2MG /3ML pen-injector has been APPROVED from 06/13/24 to 07/14/24. Unable to obtain price due to refill too soon rejection, last fill date 06/13/24 next available fill date11/21/26   PA #/Case ID/Reference #: [id number]

## 2024-06-13 NOTE — Telephone Encounter (Signed)
 Pharmacy Patient Advocate Encounter   Received notification from Onbase that prior authorization for Ozempic  (0.25 or 0.5 MG/DOSE) 2MG /3ML pen-injectors is required/requested.   Insurance verification completed.   The patient is insured through [facility] MEDICAID.   Per test claim: PA required; PA started via CoverMyMeds. KEY [id number] . Waiting for clinical questions to populate.

## 2024-06-17 ENCOUNTER — Other Ambulatory Visit (HOSPITAL_COMMUNITY): Payer: Self-pay

## 2024-06-24 ENCOUNTER — Ambulatory Visit: Admitting: Family Medicine

## 2024-07-09 ENCOUNTER — Ambulatory Visit (INDEPENDENT_AMBULATORY_CARE_PROVIDER_SITE_OTHER): Admitting: Family Medicine

## 2024-07-09 ENCOUNTER — Encounter (INDEPENDENT_AMBULATORY_CARE_PROVIDER_SITE_OTHER): Payer: Self-pay | Admitting: *Deleted

## 2024-07-09 VITALS — BP 143/68 | HR 63 | Temp 97.9°F | Ht 68.0 in | Wt 219.0 lb

## 2024-07-09 DIAGNOSIS — F419 Anxiety disorder, unspecified: Secondary | ICD-10-CM

## 2024-07-09 DIAGNOSIS — L304 Erythema intertrigo: Secondary | ICD-10-CM | POA: Diagnosis not present

## 2024-07-09 DIAGNOSIS — K219 Gastro-esophageal reflux disease without esophagitis: Secondary | ICD-10-CM | POA: Diagnosis not present

## 2024-07-09 DIAGNOSIS — E785 Hyperlipidemia, unspecified: Secondary | ICD-10-CM

## 2024-07-09 DIAGNOSIS — F32A Depression, unspecified: Secondary | ICD-10-CM | POA: Diagnosis not present

## 2024-07-09 DIAGNOSIS — M5090 Cervical disc disorder, unspecified, unspecified cervical region: Secondary | ICD-10-CM

## 2024-07-09 DIAGNOSIS — Z6833 Body mass index (BMI) 33.0-33.9, adult: Secondary | ICD-10-CM

## 2024-07-09 DIAGNOSIS — G43809 Other migraine, not intractable, without status migrainosus: Secondary | ICD-10-CM | POA: Diagnosis not present

## 2024-07-09 DIAGNOSIS — F1111 Opioid abuse, in remission: Secondary | ICD-10-CM

## 2024-07-09 DIAGNOSIS — E1169 Type 2 diabetes mellitus with other specified complication: Secondary | ICD-10-CM | POA: Diagnosis not present

## 2024-07-09 DIAGNOSIS — I10 Essential (primary) hypertension: Secondary | ICD-10-CM

## 2024-07-09 DIAGNOSIS — E119 Type 2 diabetes mellitus without complications: Secondary | ICD-10-CM

## 2024-07-09 MED ORDER — BLOOD GLUCOSE TEST VI STRP: 1.0000 | ORAL_STRIP | 0 refills | Status: AC

## 2024-07-09 MED ORDER — ESCITALOPRAM OXALATE 20 MG PO TABS: 20.0000 mg | ORAL_TABLET | Freq: Every day | ORAL | 1 refills | Status: AC

## 2024-07-09 MED ORDER — OZEMPIC (0.25 OR 0.5 MG/DOSE) 2 MG/3ML ~~LOC~~ SOPN: 0.2500 mg | PEN_INJECTOR | SUBCUTANEOUS | 0 refills | Status: AC

## 2024-07-09 MED ORDER — CLOTRIMAZOLE 1 % EX CREA: 1.0000 | TOPICAL_CREAM | Freq: Two times a day (BID) | CUTANEOUS | 1 refills | Status: AC

## 2024-07-09 MED ORDER — BLOOD GLUCOSE MONITORING SUPPL DEVI: 1.0000 | 0 refills | Status: AC

## 2024-07-09 MED ORDER — LISINOPRIL 20 MG PO TABS: 20.0000 mg | ORAL_TABLET | Freq: Every day | ORAL | 0 refills | Status: AC

## 2024-07-09 MED ORDER — LANCET DEVICE MISC: 1.0000 | 0 refills | Status: AC

## 2024-07-09 MED ORDER — LANCETS MISC: 1.0000 | 0 refills | Status: AC

## 2024-07-09 NOTE — Progress Notes (Signed)
 Established Patient Office Visit  Patient ID: Tina Pham, female    DOB: 04-17-1976  Age: 48 y.o. MRN: [id number] PCP: Alcus Oneil LELON, FNP  Chief Complaint  Patient presents with   Follow-up    1 month follow up- Had issues with getting pharmacy to fill Ozempic  so patient has not picked up/started taking. Did pick up the Atorvastatin , Lisinopril , and Lexapro , and takes as directed. Patient reports that she does feel better taking the medications.    Diabetic Supplies    Patient requesting order for new glucose meter and supplies    Subjective:     HPI  Discussed the use of AI scribe software for clinical note transcription with the patient, who gave verbal consent to proceed.  History of Present Illness   Tina Pham is a 48 year old female with diabetes who presents with a rash under her breasts.  Intertriginous rash - Rash under breasts x 2 weeks - Appears as a cut along the breast line - Painful, with burning and discomfort, especially when not wearing a bra due to skin shifting - No improvement with [employer] medicated powder, Vaseline, or zinc oxide skin protectant - Rash is worsening  Diabetes mellitus and glycemic control - Diabetes mellitus with difficulty obtaining prescribed Ozempic  0.25 mg weekly due to pharmacy confusion.  - Medication approved but not yet started - Patient states that [facility] in [place] told her she could pick her medicine up on 07/14/24 after the hold is off.  - Requested new order for glucose meter and supplies.   Hypertension - Takes lisinopril  10 mg daily - Home blood pressure readings in the 140s to 150s. Patient reports checking her BP at work with their machine.   Hyperlipidemia - Started Lipitor last month.  - Denies side effects.   Gastroesophageal reflux disease - Takes esomeprazole daily for acid reflux - Scheduled for GI f/u in a couple months.   Migraine headaches - Reports being scheduled with neurology in a  few months.   Depression and anxiety - Has been taking Lexapro  10 mg/day x 1 month. Patient states that her symptoms have improved but there is still some residual symptoms.  - Taking lexapro  in the morning was causing some nausea so patient is taking at night which has helped.   Obesity - BMI is 33         ROS    Objective:     BP (!) 143/68   Pulse 63   Temp 97.9 F (36.6 C)   Ht 5' 8 (1.727 m)   Wt 219 lb (99.3 kg)   SpO2 99%   BMI 33.30 kg/m    Physical Exam Vitals reviewed.  Constitutional:      Appearance: Normal appearance.  HENT:     Head: Normocephalic and atraumatic.  Eyes:     Extraocular Movements: Extraocular movements intact.     Conjunctiva/sclera: Conjunctivae normal.     Pupils: Pupils are equal, round, and reactive to light.  Cardiovascular:     Rate and Rhythm: Normal rate and regular rhythm.     Pulses: Normal pulses.     Heart sounds: Normal heart sounds. No murmur heard. Pulmonary:     Effort: Pulmonary effort is normal. No respiratory distress.     Breath sounds: Normal breath sounds.  Musculoskeletal:        General: No deformity. Normal range of motion.     Cervical back: Normal range of motion.  Skin:  General: Skin is warm and dry.     Findings: Rash present.     Comments: Well demarcated and erythematous skin in linear fashion to folds beneath both breasts.   Neurological:     General: No focal deficit present.     Mental Status: She is alert and oriented to person, place, and time.  Psychiatric:        Mood and Affect: Mood normal.        Behavior: Behavior normal.      No results found for any visits on 07/09/24.    The 10-year ASCVD risk score (Arnett DK, et al., 2019) is: 20.3%     07/09/2024    8:33 AM 06/06/2024    8:42 AM  GAD 7 : Generalized Anxiety Score  Nervous, Anxious, on Edge 0 1  Control/stop worrying 0 2  Worry too much - different things 0 2  Trouble relaxing 0 1  Restless 1 1  Easily  annoyed or irritable 1 1  Afraid - awful might happen 0 0  Total GAD 7 Score 2 8  Anxiety Difficulty Somewhat difficult Somewhat difficult       07/09/2024    8:33 AM 06/06/2024    8:38 AM  Depression screen PHQ 2/9  Decreased Interest 0 1  Down, Depressed, Hopeless 1 1  PHQ - 2 Score 1 2  Altered sleeping 1 0  Tired, decreased energy 1 3  Change in appetite 2 3  Feeling bad or failure about yourself  0 1  Trouble concentrating 2 2  Moving slowly or fidgety/restless 0 0  Suicidal thoughts 0 0  PHQ-9 Score 7 11  Difficult doing work/chores Somewhat difficult Somewhat difficult       Assessment & Plan:   Problem List Items Addressed This Visit       Cardiovascular and Mediastinum   Essential hypertension   Relevant Medications   lisinopril  (ZESTRIL ) 20 MG tablet     Digestive   GERD (gastroesophageal reflux disease)   Other Visit Diagnoses       Diabetes mellitus without complication (HCC)    -  Primary   Relevant Medications   OZEMPIC , 0.25 OR 0.5 MG/DOSE, 2 MG/3ML SOPN   lisinopril  (ZESTRIL ) 20 MG tablet   Blood Glucose Monitoring Suppl DEVI   Glucose Blood (BLOOD GLUCOSE TEST STRIPS) STRP   Lancet Device MISC   Lancets MISC     Anxiety       Relevant Medications   escitalopram  (LEXAPRO ) 20 MG tablet     Depression, unspecified depression type       Relevant Medications   escitalopram  (LEXAPRO ) 20 MG tablet     Intertrigo       Relevant Medications   clotrimazole  (LOTRIMIN ) 1 % cream     Hyperlipidemia associated with type 2 diabetes mellitus (HCC)       Relevant Medications   OZEMPIC , 0.25 OR 0.5 MG/DOSE, 2 MG/3ML SOPN   lisinopril  (ZESTRIL ) 20 MG tablet     BMI 33.0-33.9,adult         Other migraine without status migrainosus, not intractable       Relevant Medications   lisinopril  (ZESTRIL ) 20 MG tablet   escitalopram  (LEXAPRO ) 20 MG tablet     Cervical disc disease         History of opioid abuse (HCC)           Assessment and Plan     Intertrigo with cutaneous candidiasis - Prescribed clotrimazole  cream  twice daily for 2-4 weeks, continue for a week past resolution. - Follow up if symptoms acutely worsen, no improvement over the next 2-3 days, fever develops, or for any other concerns  Type 2 diabetes mellitus Difficulty obtaining Ozempic  due to insurance and pharmacy confusion.  - Patient states that [facility] in [place] reports that the patient can pick up the ozempic  on 07/14/24. - Patient to follow up with office if she is unable to obtain to obtain ozempic  on 07/14/24. - Sent prescription for glucose meter and supplies to pharmacy.   Essential hypertension - Increased lisinopril  to 20 mg daily. - Instructed to monitor blood pressure daily and report readings after 1 week.   Generalized anxiety disorder - Increased Lexapro  to 20 mg daily  Hyperlipidemia Cholesterol levels elevated, currently on Lipitor. - Continue Lipitor at current dose. - Will recheck cholesterol levels in two months.     Gastroesophageal reflux disease - Takes esomeprazole daily for acid reflux - Scheduled for GI f/u in a couple months.   Migraine headaches - Reports being scheduled with neurology 3/26.  Opioid use disorder in remission on medication-assisted therapy Opioid use disorder in remission, currently on Suboxone. Actively engaged in outpatient rehabilitation program. - Continue Suboxone therapy. - Continue outpatient rehabilitation program.  Cervical radiculopathy and stenosis - Has appointment with Dr. Georgina 1/26.    Return in about 2 months (around 09/09/2024).    Oneil LELON Severin, FNP [facility]

## 2024-07-09 NOTE — Patient Instructions (Signed)
 Follow up if symptoms acutely worsen, no improvement over the next 2-3 days, fever develops, or for any other concerns

## 2024-07-29 ENCOUNTER — Encounter: Payer: Self-pay | Admitting: Family Medicine

## 2024-07-29 ENCOUNTER — Telehealth (INDEPENDENT_AMBULATORY_CARE_PROVIDER_SITE_OTHER): Admitting: Family Medicine

## 2024-07-29 DIAGNOSIS — R0789 Other chest pain: Secondary | ICD-10-CM | POA: Diagnosis not present

## 2024-07-29 DIAGNOSIS — J069 Acute upper respiratory infection, unspecified: Secondary | ICD-10-CM

## 2024-07-29 MED ORDER — PREDNISONE 20 MG PO TABS: 40.0000 mg | ORAL_TABLET | Freq: Every day | ORAL | 0 refills | Status: AC

## 2024-07-29 MED ORDER — AMOXICILLIN-POT CLAVULANATE 875-125 MG PO TABS: 1.0000 | ORAL_TABLET | Freq: Two times a day (BID) | ORAL | 0 refills | Status: AC

## 2024-07-29 MED ORDER — ALBUTEROL SULFATE HFA 108 (90 BASE) MCG/ACT IN AERS: 2.0000 | INHALATION_SPRAY | Freq: Four times a day (QID) | RESPIRATORY_TRACT | 0 refills | Status: AC | PRN

## 2024-07-29 NOTE — Progress Notes (Signed)
 "   Virtual Visit via Video   I connected with patient on 07/29/2024 at 1240 by a video enabled telemedicine application and verified that I am speaking with the correct person using two identifiers.  Location patient: Home Location provider: [facility] Persons participating in the virtual visit: Patient and Provider  I discussed the limitations of evaluation and management by telemedicine and the availability of in person appointments. The patient expressed understanding and agreed to proceed.  Subjective:   HPI:  Pt presents today for  Chief Complaint  Patient presents with   URI   Chest Pain   Tina Pham is a 49 year old female who presents with chest tightness and upper respiratory symptoms.  She reports a sensation of tightness in the center of her chest, primarily during physical exertion such as walking or working. The tightness is absent at rest but becomes noticeable with activity, and she reports feeling winded during these episodes. No associated fever, chills, jaw, neck, arm, or back pain, nausea, vomiting, dizziness, weakness, or sweatiness.  She has upper respiratory symptoms including congestion, cough, and a sore throat. No fever or chills. She has been managing these symptoms with Tylenol  and Advil . She is concerned about potential interactions with Suboxone, which she has been taking for seven months as part of her addiction recovery.  She works in a group home, often working long hours, up to twelve or thirteen hours a day for twelve days straight. She feels 'always tired' due to her demanding work schedule.       ROS per HPI  Patient Active Problem List   Diagnosis Date Noted   Critical limb ischemia of left lower extremity (HCC) 09/11/2023   Open wound of right great toe 09/29/2022   Uncontrolled diabetes mellitus with hyperglycemia (HCC) 09/29/2022   GERD (gastroesophageal reflux disease) 09/29/2022   Essential  hypertension 09/29/2022   Mixed hyperlipidemia 09/29/2022   Obesity (BMI 30-39.9) 09/29/2022   Peripheral neuropathy 09/29/2022   Cellulitis of great toe of right foot 09/28/2022   Arthrosis of right acromioclavicular joint    PAD (peripheral artery disease) 07/13/2020   S/P right rotator cuff repair 10/04/2018   Complete tear of right rotator cuff    Chronic pain of left knee    Left shoulder pain 09/21/2015   Disorder of bursae and tendons in shoulder region 09/25/2013    Social History   Tobacco Use   Smoking status: Every Day    Current packs/day: 1.00    Average packs/day: 1 pack/day for 31.0 years (31.0 ttl pk-yrs)    Types: Cigarettes   Smokeless tobacco: Never  Substance Use Topics   Alcohol use: No   Current Medications[1]  Allergies[2]  Objective:   There were no vitals taken for this visit.  Patient is well-developed, well-nourished in no acute distress.  Resting comfortably at home.  Head is normocephalic, atraumatic.  No labored breathing.  Speech is clear and coherent with logical content.  Patient is alert and oriented at baseline.  Raspy voice  Assessment and Plan:   Kailly was seen today for uri and chest pain.  Diagnoses and all orders for this visit:  URI with cough and congestion -     predniSONE  (DELTASONE ) 20 MG tablet; Take 2 tablets (40 mg total) by mouth daily with breakfast for 5 days. -     amoxicillin -clavulanate (AUGMENTIN ) 875-125 MG tablet; Take 1 tablet by mouth 2 (two) times daily. -  albuterol  (VENTOLIN  HFA) 108 (90 Base) MCG/ACT inhaler; Inhale 2 puffs into the lungs every 6 (six) hours as needed for wheezing or shortness of breath.  Chest tightness -     predniSONE  (DELTASONE ) 20 MG tablet; Take 2 tablets (40 mg total) by mouth daily with breakfast for 5 days. -     albuterol  (VENTOLIN  HFA) 108 (90 Base) MCG/ACT inhaler; Inhale 2 puffs into the lungs every 6 (six) hours as needed for wheezing or shortness of breath.       Acute upper respiratory infection Symptoms include congestion, cough, sore throat, and chest tightness. Negative COVID test. No fever or chills. Symptoms suggestive of URI. - Prescribed Augmentin  for bacterial infection - Prescribed prednisone  to reduce inflammation - Prescribed albuterol  inhaler to relieve chest tightness  Chest tightness Intermittent chest tightness with exertion, described as tight, located in the center of the chest. No associated jaw, neck, arm, or back pain. No nausea, vomiting, dizziness, weakness, or sweating. Differential includes cardiac etiology, but no definitive signs of heart attack. Chest tightness may be related to URI. - Advised to go to the emergency room if chest pain worsens or if other symptoms develop - Recommended follow-up with primary care provider in the next couple of days  Opioid dependence, in remission Currently on Suboxone for opioid dependence, in remission for seven months. Concerns about medication interactions with over-the-counter medications. - Prescribed medications that do not interfere with Suboxone        Follow up with PCP within 2-3 days for reevaluation, sooner if worsening. Aware to go to ED if symptoms worsen.   Rosaline Bruns, FNP-C [facility] [address] [place], [place] [address] [phone]  07/29/2024  Time spent with the patient: 15 minutes, of which >50% was spent in obtaining information about symptoms, reviewing previous labs, evaluations, and treatments, counseling about condition (please see the discussed topics above), and developing a plan to further investigate it; had a number of questions which I addressed.      [1]  Current Outpatient Medications:    albuterol  (VENTOLIN  HFA) 108 (90 Base) MCG/ACT inhaler, Inhale 2 puffs into the lungs every 6 (six) hours as needed for wheezing or shortness of breath., Disp: 8 g, Rfl: 0   amoxicillin -clavulanate (AUGMENTIN ) 875-125 MG  tablet, Take 1 tablet by mouth 2 (two) times daily., Disp: 20 tablet, Rfl: 0   predniSONE  (DELTASONE ) 20 MG tablet, Take 2 tablets (40 mg total) by mouth daily with breakfast for 5 days., Disp: 10 tablet, Rfl: 0   atorvastatin  (LIPITOR) 20 MG tablet, Take 1 tablet (20 mg total) by mouth daily., Disp: 90 tablet, Rfl: 0   Blood Glucose Monitoring Suppl DEVI, 1 each by Does not apply route as directed. Dispense based on patient and insurance preference. Use up to four times daily as directed. (FOR ICD-10 E10.9, E11.9)., Disp: 1 each, Rfl: 0   Buprenorphine HCl-Naloxone HCl 8-2 MG FILM, Place under the tongue 4 (four) times daily., Disp: , Rfl:    cloNIDine  (CATAPRES ) 0.1 MG tablet, Take 0.1 mg by mouth at bedtime., Disp: , Rfl:    clotrimazole  (LOTRIMIN ) 1 % cream, Apply 1 Application topically 2 (two) times daily., Disp: 30 g, Rfl: 1   escitalopram  (LEXAPRO ) 20 MG tablet, Take 1 tablet (20 mg total) by mouth daily., Disp: 30 tablet, Rfl: 1   esomeprazole (NEXIUM) 20 MG capsule, Take 20 mg by mouth daily as needed (acid reflux)., Disp: , Rfl:    gabapentin  (NEURONTIN )  300 MG capsule, TAKE 1 CAPSULE(300 MG) BY MOUTH THREE TIMES DAILY, Disp: 90 capsule, Rfl: 5   Glucose Blood (BLOOD GLUCOSE TEST STRIPS) STRP, 1 each by Does not apply route as directed. Dispense based on patient and insurance preference. Use up to four times daily as directed. (FOR ICD-10 E10.9, E11.9)., Disp: 100 strip, Rfl: 0   Lancet Device MISC, 1 each by Does not apply route as directed. Dispense based on patient and insurance preference. Use up to four times daily as directed. (FOR ICD-10 E10.9, E11.9)., Disp: 1 each, Rfl: 0   Lancets MISC, 1 each by Does not apply route as directed. Dispense based on patient and insurance preference. Use up to four times daily as directed. (FOR ICD-10 E10.9, E11.9)., Disp: 100 each, Rfl: 0   lisinopril  (ZESTRIL ) 20 MG tablet, Take 1 tablet (20 mg total) by mouth daily., Disp: 90 tablet, Rfl: 0    OZEMPIC , 0.25 OR 0.5 MG/DOSE, 2 MG/3ML SOPN, Inject 0.25 mg into the skin once a week., Disp: 2 mL, Rfl: 0 [2]  Allergies Allergen Reactions   Codeine Other (See Comments)    Upset Stomach   Metformin  Other (See Comments)    Will not take per MD advisement    Benadryl [Diphenhydramine Hcl] Palpitations   Compazine Palpitations   "

## 2024-08-13 ENCOUNTER — Ambulatory Visit (INDEPENDENT_AMBULATORY_CARE_PROVIDER_SITE_OTHER): Admitting: Gastroenterology

## 2024-08-13 ENCOUNTER — Ambulatory Visit: Admitting: Orthopedic Surgery

## 2024-10-07 ENCOUNTER — Ambulatory Visit: Admitting: Diagnostic Neuroimaging

## 2024-10-14 ENCOUNTER — Ambulatory Visit: Admitting: Family Medicine
